# Patient Record
Sex: Male | Born: 1950
Health system: Southern US, Community
[De-identification: ages and names within clinical notes are randomized; demographics above are authoritative.]

## PROBLEM LIST (undated history)

## (undated) ENCOUNTER — Emergency Department: Admission: EM | Discharge status: Absent without leave | Payer: Self-pay

## (undated) DIAGNOSIS — I10 Essential (primary) hypertension: Secondary | ICD-10-CM

## (undated) DIAGNOSIS — G510 Bell's palsy: Secondary | ICD-10-CM

## (undated) DIAGNOSIS — E785 Hyperlipidemia, unspecified: Secondary | ICD-10-CM

## (undated) DIAGNOSIS — F329 Major depressive disorder, single episode, unspecified: Secondary | ICD-10-CM

## (undated) DIAGNOSIS — F32A Depression, unspecified: Secondary | ICD-10-CM

## (undated) HISTORY — DX: Major depressive disorder, single episode, unspecified: F32.9

## (undated) HISTORY — PX: OTHER SURGICAL HISTORY: SHX169

## (undated) HISTORY — DX: Essential (primary) hypertension: I10

## (undated) HISTORY — DX: Bell's palsy: G51.0

## (undated) HISTORY — DX: Hyperlipidemia, unspecified: E78.5

## (undated) HISTORY — DX: Depression, unspecified: F32.A

## (undated) HISTORY — PX: CATARACT EXTRACTION: SUR2

---

## 1992-06-29 ENCOUNTER — Encounter: Payer: Self-pay | Admitting: Family Medicine

## 2003-01-22 ENCOUNTER — Emergency Department (HOSPITAL_COMMUNITY): Admission: EM | Admit: 2003-01-22 | Discharge: 2003-01-22 | Payer: Self-pay | Admitting: Emergency Medicine

## 2004-01-11 ENCOUNTER — Encounter: Payer: Self-pay | Admitting: Family Medicine

## 2004-01-11 LAB — CONVERTED CEMR LAB

## 2005-08-13 ENCOUNTER — Ambulatory Visit: Payer: Self-pay | Admitting: Family Medicine

## 2005-09-04 ENCOUNTER — Ambulatory Visit: Payer: Self-pay | Admitting: Family Medicine

## 2005-11-02 ENCOUNTER — Ambulatory Visit: Payer: Self-pay | Admitting: Family Medicine

## 2005-11-30 ENCOUNTER — Ambulatory Visit: Payer: Self-pay | Admitting: Family Medicine

## 2005-12-28 ENCOUNTER — Ambulatory Visit: Payer: Self-pay | Admitting: Family Medicine

## 2006-02-07 ENCOUNTER — Ambulatory Visit: Payer: Self-pay | Admitting: Family Medicine

## 2006-02-21 ENCOUNTER — Ambulatory Visit: Payer: Self-pay | Admitting: Family Medicine

## 2006-03-20 ENCOUNTER — Ambulatory Visit: Payer: Self-pay | Admitting: Family Medicine

## 2006-03-28 ENCOUNTER — Ambulatory Visit: Payer: Self-pay | Admitting: Family Medicine

## 2006-04-12 LAB — HM COLONOSCOPY

## 2006-04-17 ENCOUNTER — Ambulatory Visit: Payer: Self-pay | Admitting: Internal Medicine

## 2006-04-25 ENCOUNTER — Encounter (INDEPENDENT_AMBULATORY_CARE_PROVIDER_SITE_OTHER): Payer: Self-pay | Admitting: Specialist

## 2006-04-25 ENCOUNTER — Ambulatory Visit: Payer: Self-pay | Admitting: Internal Medicine

## 2006-05-14 ENCOUNTER — Ambulatory Visit: Payer: Self-pay | Admitting: Family Medicine

## 2006-08-19 ENCOUNTER — Ambulatory Visit: Payer: Self-pay | Admitting: Family Medicine

## 2006-08-20 DIAGNOSIS — I10 Essential (primary) hypertension: Secondary | ICD-10-CM | POA: Insufficient documentation

## 2006-08-20 DIAGNOSIS — N200 Calculus of kidney: Secondary | ICD-10-CM | POA: Insufficient documentation

## 2006-08-20 DIAGNOSIS — F339 Major depressive disorder, recurrent, unspecified: Secondary | ICD-10-CM | POA: Insufficient documentation

## 2006-08-20 DIAGNOSIS — E785 Hyperlipidemia, unspecified: Secondary | ICD-10-CM | POA: Insufficient documentation

## 2006-08-20 DIAGNOSIS — G4730 Sleep apnea, unspecified: Secondary | ICD-10-CM | POA: Insufficient documentation

## 2006-08-20 DIAGNOSIS — F17200 Nicotine dependence, unspecified, uncomplicated: Secondary | ICD-10-CM | POA: Insufficient documentation

## 2006-09-10 ENCOUNTER — Ambulatory Visit: Payer: Self-pay | Admitting: Family Medicine

## 2006-09-16 ENCOUNTER — Encounter: Payer: Self-pay | Admitting: Family Medicine

## 2006-09-16 LAB — CONVERTED CEMR LAB
Albumin: 4.4 g/dL (ref 3.5–5.2)
CO2: 22 meq/L (ref 19–32)
Cholesterol: 168 mg/dL (ref 0–200)
Glucose, Bld: 137 mg/dL — ABNORMAL HIGH (ref 70–99)
LDL Cholesterol: 105 mg/dL — ABNORMAL HIGH (ref 0–99)
PSA: 0.87 ng/mL (ref 0.10–4.00)
Potassium: 4.2 meq/L (ref 3.5–5.3)
Sodium: 138 meq/L (ref 135–145)
Total Protein: 6.8 g/dL (ref 6.0–8.3)
Triglycerides: 135 mg/dL (ref <150)

## 2006-09-19 ENCOUNTER — Encounter: Payer: Self-pay | Admitting: Family Medicine

## 2006-12-06 ENCOUNTER — Telehealth: Payer: Self-pay | Admitting: Family Medicine

## 2007-03-13 ENCOUNTER — Ambulatory Visit: Payer: Self-pay | Admitting: Family Medicine

## 2007-03-13 DIAGNOSIS — N3941 Urge incontinence: Secondary | ICD-10-CM | POA: Insufficient documentation

## 2007-03-13 DIAGNOSIS — E11630 Type 2 diabetes mellitus with periodontal disease: Secondary | ICD-10-CM | POA: Insufficient documentation

## 2007-03-13 LAB — CONVERTED CEMR LAB
Blood in Urine, dipstick: NEGATIVE
Glucose, Urine, Semiquant: NEGATIVE
Ketones, urine, test strip: NEGATIVE
Specific Gravity, Urine: <1.005
WBC Urine, dipstick: NEGATIVE
pH: 5.5

## 2007-05-06 ENCOUNTER — Ambulatory Visit: Payer: Self-pay | Admitting: Family Medicine

## 2007-05-06 LAB — CONVERTED CEMR LAB

## 2007-05-12 ENCOUNTER — Telehealth (INDEPENDENT_AMBULATORY_CARE_PROVIDER_SITE_OTHER): Payer: Self-pay | Admitting: *Deleted

## 2007-08-12 ENCOUNTER — Ambulatory Visit: Payer: Self-pay | Admitting: Family Medicine

## 2007-08-12 ENCOUNTER — Encounter: Admission: RE | Admit: 2007-08-12 | Discharge: 2007-08-12 | Payer: Self-pay | Admitting: Family Medicine

## 2007-08-14 ENCOUNTER — Telehealth (INDEPENDENT_AMBULATORY_CARE_PROVIDER_SITE_OTHER): Payer: Self-pay | Admitting: *Deleted

## 2007-08-18 ENCOUNTER — Telehealth: Payer: Self-pay | Admitting: Family Medicine

## 2007-08-25 ENCOUNTER — Ambulatory Visit: Payer: Self-pay | Admitting: Family Medicine

## 2007-09-25 ENCOUNTER — Ambulatory Visit: Payer: Self-pay | Admitting: Family Medicine

## 2007-10-20 ENCOUNTER — Telehealth: Payer: Self-pay | Admitting: Family Medicine

## 2007-12-25 ENCOUNTER — Ambulatory Visit: Payer: Self-pay | Admitting: Family Medicine

## 2007-12-26 ENCOUNTER — Encounter: Payer: Self-pay | Admitting: Family Medicine

## 2007-12-26 LAB — CONVERTED CEMR LAB
Albumin: 4.5 g/dL (ref 3.5–5.2)
Alkaline Phosphatase: 61 units/L (ref 39–117)
BUN: 15 mg/dL (ref 6–23)
CO2: 23 meq/L (ref 19–32)
Calcium: 9.8 mg/dL (ref 8.4–10.5)
Cholesterol: 164 mg/dL (ref 0–200)
Glucose, Bld: 140 mg/dL — ABNORMAL HIGH (ref 70–99)
HDL: 39 mg/dL — ABNORMAL LOW (ref >39)
LDL Cholesterol: 97 mg/dL (ref 0–99)
Potassium: 4.3 meq/L (ref 3.5–5.3)
Total Protein: 7 g/dL (ref 6.0–8.3)
Triglycerides: 138 mg/dL (ref <150)

## 2008-01-07 ENCOUNTER — Ambulatory Visit: Payer: Self-pay | Admitting: Cardiology

## 2008-01-08 ENCOUNTER — Telehealth: Payer: Self-pay | Admitting: Family Medicine

## 2008-01-14 ENCOUNTER — Encounter: Payer: Self-pay | Admitting: Family Medicine

## 2008-01-14 ENCOUNTER — Ambulatory Visit: Payer: Self-pay

## 2008-01-26 ENCOUNTER — Telehealth: Payer: Self-pay | Admitting: Family Medicine

## 2008-01-30 ENCOUNTER — Telehealth: Payer: Self-pay | Admitting: Family Medicine

## 2008-02-02 ENCOUNTER — Ambulatory Visit: Payer: Self-pay | Admitting: Family Medicine

## 2008-02-12 ENCOUNTER — Telehealth: Payer: Self-pay | Admitting: Family Medicine

## 2008-03-23 ENCOUNTER — Ambulatory Visit: Payer: Self-pay | Admitting: Family Medicine

## 2008-03-24 ENCOUNTER — Encounter: Payer: Self-pay | Admitting: Family Medicine

## 2008-06-28 ENCOUNTER — Ambulatory Visit: Payer: Self-pay | Admitting: Family Medicine

## 2008-06-28 LAB — CONVERTED CEMR LAB
Creatinine,U: 50 mg/dL
Hgb A1c MFr Bld: 8.1 %
Microalbumin U total vol: 10 mg/L

## 2008-07-01 ENCOUNTER — Ambulatory Visit (HOSPITAL_COMMUNITY): Payer: Self-pay | Admitting: Psychology

## 2008-07-07 ENCOUNTER — Telehealth (INDEPENDENT_AMBULATORY_CARE_PROVIDER_SITE_OTHER): Payer: Self-pay | Admitting: *Deleted

## 2008-07-12 ENCOUNTER — Ambulatory Visit (HOSPITAL_COMMUNITY): Payer: Self-pay | Admitting: Psychology

## 2008-07-20 ENCOUNTER — Ambulatory Visit (HOSPITAL_COMMUNITY): Payer: Self-pay | Admitting: Psychology

## 2008-07-28 ENCOUNTER — Ambulatory Visit (HOSPITAL_COMMUNITY): Payer: Self-pay | Admitting: Psychology

## 2008-08-04 ENCOUNTER — Ambulatory Visit (HOSPITAL_COMMUNITY): Payer: Self-pay | Admitting: Psychology

## 2008-08-09 ENCOUNTER — Ambulatory Visit: Payer: Self-pay | Admitting: Family Medicine

## 2008-08-10 ENCOUNTER — Telehealth: Payer: Self-pay | Admitting: Family Medicine

## 2008-08-11 ENCOUNTER — Ambulatory Visit (HOSPITAL_COMMUNITY): Payer: Self-pay | Admitting: Psychology

## 2008-08-19 ENCOUNTER — Ambulatory Visit (HOSPITAL_COMMUNITY): Payer: Self-pay | Admitting: Psychology

## 2008-08-20 ENCOUNTER — Telehealth: Payer: Self-pay | Admitting: Family Medicine

## 2008-08-26 ENCOUNTER — Ambulatory Visit (HOSPITAL_COMMUNITY): Payer: Self-pay | Admitting: Psychology

## 2008-09-02 ENCOUNTER — Ambulatory Visit (HOSPITAL_COMMUNITY): Payer: Self-pay | Admitting: Psychology

## 2008-09-09 ENCOUNTER — Telehealth: Payer: Self-pay | Admitting: Family Medicine

## 2008-09-14 ENCOUNTER — Ambulatory Visit (HOSPITAL_COMMUNITY): Payer: Self-pay | Admitting: Psychology

## 2008-09-28 ENCOUNTER — Ambulatory Visit (HOSPITAL_COMMUNITY): Payer: Self-pay | Admitting: Psychology

## 2008-09-29 ENCOUNTER — Telehealth: Payer: Self-pay | Admitting: Family Medicine

## 2008-10-12 ENCOUNTER — Ambulatory Visit (HOSPITAL_COMMUNITY): Payer: Self-pay | Admitting: Psychology

## 2008-10-22 ENCOUNTER — Ambulatory Visit (HOSPITAL_COMMUNITY): Payer: Self-pay | Admitting: Psychiatry

## 2008-10-26 ENCOUNTER — Ambulatory Visit (HOSPITAL_COMMUNITY): Payer: Self-pay | Admitting: Psychology

## 2008-11-08 ENCOUNTER — Ambulatory Visit: Payer: Self-pay | Admitting: Family Medicine

## 2008-11-08 LAB — CONVERTED CEMR LAB: Hgb A1c MFr Bld: 7.1 %

## 2008-11-10 ENCOUNTER — Ambulatory Visit (HOSPITAL_COMMUNITY): Payer: Self-pay | Admitting: Psychology

## 2008-11-23 ENCOUNTER — Ambulatory Visit (HOSPITAL_COMMUNITY): Payer: Self-pay | Admitting: Psychology

## 2008-11-26 ENCOUNTER — Ambulatory Visit: Payer: Self-pay | Admitting: Family Medicine

## 2008-11-26 DIAGNOSIS — IMO0002 Reserved for concepts with insufficient information to code with codable children: Secondary | ICD-10-CM | POA: Insufficient documentation

## 2008-12-03 ENCOUNTER — Telehealth: Payer: Self-pay | Admitting: Family Medicine

## 2008-12-03 ENCOUNTER — Ambulatory Visit (HOSPITAL_COMMUNITY): Payer: Self-pay | Admitting: Psychiatry

## 2008-12-07 ENCOUNTER — Ambulatory Visit (HOSPITAL_COMMUNITY): Payer: Self-pay | Admitting: Psychology

## 2008-12-14 ENCOUNTER — Telehealth: Payer: Self-pay | Admitting: Family Medicine

## 2008-12-15 ENCOUNTER — Encounter: Payer: Self-pay | Admitting: Family Medicine

## 2008-12-28 ENCOUNTER — Ambulatory Visit (HOSPITAL_COMMUNITY): Payer: Self-pay | Admitting: Psychology

## 2009-01-18 ENCOUNTER — Ambulatory Visit (HOSPITAL_COMMUNITY): Payer: Self-pay | Admitting: Psychology

## 2009-02-02 ENCOUNTER — Ambulatory Visit (HOSPITAL_COMMUNITY): Payer: Self-pay | Admitting: Psychiatry

## 2009-02-07 ENCOUNTER — Ambulatory Visit: Payer: Self-pay | Admitting: Family Medicine

## 2009-02-07 LAB — CONVERTED CEMR LAB: Hgb A1c MFr Bld: 7.4 %

## 2009-02-21 ENCOUNTER — Encounter: Payer: Self-pay | Admitting: Family Medicine

## 2009-02-23 LAB — CONVERTED CEMR LAB
AST: 13 units/L (ref 0–37)
Albumin: 4.4 g/dL (ref 3.5–5.2)
Alkaline Phosphatase: 61 units/L (ref 39–117)
CO2: 21 meq/L (ref 19–32)
Chloride: 103 meq/L (ref 96–112)
Creatinine, Ser: 0.81 mg/dL (ref 0.40–1.50)
Glucose, Bld: 144 mg/dL — ABNORMAL HIGH (ref 70–99)
HDL: 37 mg/dL — ABNORMAL LOW (ref >39)
LDL Cholesterol: 66 mg/dL (ref 0–99)
Total Bilirubin: 0.5 mg/dL (ref 0.3–1.2)
Total Protein: 6.7 g/dL (ref 6.0–8.3)

## 2009-03-28 ENCOUNTER — Ambulatory Visit (HOSPITAL_COMMUNITY): Payer: Self-pay | Admitting: Psychiatry

## 2009-05-09 ENCOUNTER — Encounter: Payer: Self-pay | Admitting: Family Medicine

## 2009-05-09 ENCOUNTER — Ambulatory Visit (HOSPITAL_COMMUNITY): Payer: Self-pay | Admitting: Psychiatry

## 2009-05-09 DIAGNOSIS — H25019 Cortical age-related cataract, unspecified eye: Secondary | ICD-10-CM | POA: Insufficient documentation

## 2009-06-16 ENCOUNTER — Ambulatory Visit: Payer: Self-pay | Admitting: Family Medicine

## 2009-06-16 LAB — CONVERTED CEMR LAB: Hgb A1c MFr Bld: 7.3 %

## 2009-07-14 ENCOUNTER — Ambulatory Visit (HOSPITAL_COMMUNITY): Payer: Self-pay | Admitting: Psychiatry

## 2009-09-16 ENCOUNTER — Ambulatory Visit: Payer: Self-pay | Admitting: Family Medicine

## 2009-10-12 DIAGNOSIS — G510 Bell's palsy: Secondary | ICD-10-CM

## 2009-10-12 HISTORY — DX: Bell's palsy: G51.0

## 2009-10-17 ENCOUNTER — Ambulatory Visit (HOSPITAL_COMMUNITY): Payer: Self-pay | Admitting: Psychiatry

## 2009-10-26 ENCOUNTER — Telehealth: Payer: Self-pay | Admitting: Family Medicine

## 2009-10-26 ENCOUNTER — Ambulatory Visit: Payer: Self-pay | Admitting: Family Medicine

## 2009-10-26 DIAGNOSIS — G510 Bell's palsy: Secondary | ICD-10-CM | POA: Insufficient documentation

## 2009-10-29 ENCOUNTER — Encounter: Admission: RE | Admit: 2009-10-29 | Discharge: 2009-10-29 | Payer: Self-pay | Admitting: Family Medicine

## 2009-11-02 ENCOUNTER — Ambulatory Visit: Payer: Self-pay | Admitting: Family Medicine

## 2009-11-03 ENCOUNTER — Telehealth (INDEPENDENT_AMBULATORY_CARE_PROVIDER_SITE_OTHER): Payer: Self-pay | Admitting: *Deleted

## 2009-11-16 ENCOUNTER — Ambulatory Visit: Payer: Self-pay | Admitting: Family Medicine

## 2010-01-02 ENCOUNTER — Ambulatory Visit: Payer: Self-pay | Admitting: Family Medicine

## 2010-01-02 LAB — HM DIABETES FOOT EXAM

## 2010-01-02 LAB — CONVERTED CEMR LAB: Microalbumin U total vol: 10 mg/L

## 2010-01-12 ENCOUNTER — Telehealth (INDEPENDENT_AMBULATORY_CARE_PROVIDER_SITE_OTHER): Payer: Self-pay | Admitting: *Deleted

## 2010-01-24 ENCOUNTER — Ambulatory Visit (HOSPITAL_COMMUNITY): Payer: Self-pay | Admitting: Psychiatry

## 2010-04-03 ENCOUNTER — Ambulatory Visit: Payer: Self-pay | Admitting: Family Medicine

## 2010-04-05 ENCOUNTER — Encounter: Payer: Self-pay | Admitting: Family Medicine

## 2010-04-06 LAB — CONVERTED CEMR LAB
Albumin: 4.3 g/dL (ref 3.5–5.2)
Alkaline Phosphatase: 63 units/L (ref 39–117)
BUN: 16 mg/dL (ref 6–23)
Calcium: 9 mg/dL (ref 8.4–10.5)
Cholesterol: 152 mg/dL (ref 0–200)
Glucose, Bld: 135 mg/dL — ABNORMAL HIGH (ref 70–99)
HDL: 38 mg/dL — ABNORMAL LOW (ref >39)
LDL Cholesterol: 91 mg/dL (ref 0–99)
Potassium: 5.3 meq/L (ref 3.5–5.3)
Triglycerides: 114 mg/dL (ref <150)

## 2010-04-25 ENCOUNTER — Ambulatory Visit (HOSPITAL_COMMUNITY): Payer: Self-pay | Admitting: Psychiatry

## 2010-05-05 ENCOUNTER — Encounter: Payer: Self-pay | Admitting: Family Medicine

## 2010-05-05 LAB — HM DIABETES EYE EXAM

## 2010-05-11 ENCOUNTER — Encounter: Payer: Self-pay | Admitting: Family Medicine

## 2010-06-05 ENCOUNTER — Ambulatory Visit (HOSPITAL_COMMUNITY): Payer: Self-pay | Admitting: Psychiatry

## 2010-06-28 ENCOUNTER — Ambulatory Visit (HOSPITAL_COMMUNITY): Payer: Self-pay | Admitting: Psychology

## 2010-07-12 ENCOUNTER — Ambulatory Visit (HOSPITAL_COMMUNITY): Payer: Self-pay | Admitting: Psychology

## 2010-08-04 ENCOUNTER — Ambulatory Visit: Payer: Self-pay | Admitting: Family Medicine

## 2010-08-04 DIAGNOSIS — L259 Unspecified contact dermatitis, unspecified cause: Secondary | ICD-10-CM | POA: Insufficient documentation

## 2010-08-08 ENCOUNTER — Ambulatory Visit (HOSPITAL_COMMUNITY): Payer: Self-pay | Admitting: Psychology

## 2010-09-04 ENCOUNTER — Ambulatory Visit (HOSPITAL_COMMUNITY): Payer: Self-pay | Admitting: Psychiatry

## 2010-10-23 ENCOUNTER — Ambulatory Visit (HOSPITAL_COMMUNITY): Payer: Self-pay | Admitting: Psychiatry

## 2010-12-05 ENCOUNTER — Encounter: Payer: Self-pay | Admitting: Family Medicine

## 2010-12-05 ENCOUNTER — Ambulatory Visit
Admission: RE | Admit: 2010-12-05 | Discharge: 2010-12-05 | Payer: Self-pay | Source: Home / Self Care | Attending: Family Medicine | Admitting: Family Medicine

## 2010-12-12 NOTE — Assessment & Plan Note (Signed)
Summary: f/u DM/ Bells Palsy   Vital Signs:  Patient profile:   60 year old male Height:      69 inches Weight:      206 pounds BMI:     30.53 O2 Sat:      97 % on Room air Temp:     98.0 degrees F oral Pulse rate:   66 / minute BP sitting:   149 / 85  (left arm) Cuff size:   large  Vitals Entered By: Payton Spark CMA (January 02, 2010 1:15 PM)  O2 Flow:  Room air  CC: F/U DM   Primary Care Provider:  Seymour Bars D.O.  CC:  F/U DM.  History of Present Illness: 60 yo WM presents for f/u T2DM, HTN, Bells Palsy.  He is on Metformin 1 gram two times a day and Lantus 15 units qHS.  He has been under some stress w/ his mom's failing health.  His Bells Palsy is improving slowly.  He admits to only fair compliance with diet and meds for his diabetes.  Denies blurry vision but has eye irritation bilat -- on the R from his Bells Palsy and has yet to f/u with optho as recommended last time.  His  AM fastings are running high at 150.    He is taking his BP meds w/o problem.  His labs are UTD.    Current Medications (verified): 1)  Metformin Hcl 1000 Mg  Tabs (Metformin Hcl) .Marland Kitchen.. 1 Tab By Mouth Bid 2)  Aspirin 81 Mg Tbec (Aspirin) .... Take 1 Tablet By Mouth Once A Day 3)  Lisinopril-Hydrochlorothiazide 10-12.5 Mg Tabs (Lisinopril-Hydrochlorothiazide) .... Take 1 Tablet By Mouth Once A Day 4)  Onetouch Ultra Test  Strp (Glucose Blood) .... Use As Directed Once Daily To Test Blood Sugar 5)  Lantus Solostar 100 Unit/ml Soln (Insulin Glargine) .Marland Kitchen.. 15  Units [place] At Bedtime 6)  Onetouch Ultra System W/device Kit (Blood Glucose Monitoring Suppl) .... Use As Directed Dx:250:00 7)  Lexapro 20 Mg Tabs (Escitalopram Oxalate) .Marland Kitchen.. 1&1/2 Tab By Mouth Daily 8)  Xanax Xr 9)  Novofine 32g X 6 Mm Misc (Insulin Pen Needle) .... Use As Directed  Allergies (verified): No Known Drug Allergies  Past History:  Past Medical History: Reviewed history from 11/16/2009 and no changes required. CPAP--  not using  hypnosis helped panic attacks  Diabetes mellitus, type II HTN hyperlipidemia depression Bells Palsy 10-2009  psych - Dr Christell Constant  Social History: Reviewed history from 08/09/2008 and no changes required. Divorced with grown son.  Has a girfriend (but has low libido).  Smokes 2ppd x 25 yrs.  Occas. ETOH.  Retired from [employer].  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 Wants to start walking.  trying to quit smoking -- on Chantix  Review of Systems      See HPI  Physical Exam  General:  alert, well-developed, well-nourished, and well-hydrated.   Head:  slight raising of the R eyebrow , improved closure R eyelid and improved smile R corner of mouth Eyes:  L>R conjunctiva injection. slightly dry R conjunctiva Nose:  no nasal discharge.   Mouth:  pharynx pink and moist and fair dentition.   Neck:  no masses.   Lungs:  Normal respiratory effort, chest expands symmetrically. Lungs are clear to auscultation, no crackles or wheezes. Heart:  Normal rate and regular rhythm. S1 and S2 normal without gallop, murmur, click, rub or other extra sounds. Pulses:  2+ radial and pedal pulses Extremities:  no LE edema  + clubbing of fingernails Skin:  color normal.   dermatitis type papular rash at back of neck. Cervical Nodes:  No lymphadenopathy noted Psych:  flat affect.    Diabetes Management Exam:    Foot Exam (with socks and/or shoes not present):       Sensory-Pinprick/Light touch:          Left medial foot (L-4): normal          Left dorsal foot (L-5): normal          Left lateral foot (S-1): normal          Right medial foot (L-4): normal          Right dorsal foot (L-5): normal          Right lateral foot (S-1): normal       Sensory-Monofilament:          Left foot: normal          Right foot: normal       Inspection:          Left foot: normal          Right foot: normal       Nails:          Left foot: normal           Right foot: normal   Impression & Recommendations:  Problem # 1:  DIABETES MELLITUS, TYPE II (ICD-250.00) A1C is up to 8 from 7.  Will increase his lantus from 15 to 20 units daily and keep metformin at current dose.  He really needs to work on improving adherence to meds and diet.   His updated medication list for this problem includes:    Metformin Hcl 1000 Mg Tabs (Metformin hcl) .Marland Kitchen... 1 tab by mouth bid    Aspirin 81 Mg Tbec (Aspirin) .Marland Kitchen... Take 1 tablet by mouth once a day    Lisinopril-hydrochlorothiazide 20-12.5 Mg Tabs (Lisinopril-hydrochlorothiazide) .Marland Kitchen... 1 tab by mouth daily    Lantus Solostar 100 Unit/ml Soln (Insulin glargine) .Marland Kitchen... 20  units [place] at bedtime  Orders: Fingerstick (36416) Hemoglobin A1C (69629) Urine Microalbumin (52841)  Labs Reviewed: Creat: 0.81 (02/21/2009)   Microalbumin: 10 (01/02/2010) Reviewed HgBA1c results: 8.1 (01/02/2010)  7.0 (09/16/2009)  Problem # 2:  HYPERTENSION, BENIGN SYSTEMIC (ICD-401.1) BP high with a goal of <130/80.  Will go up on the lisinopril portion of his pill and f/u in 3 mos. His updated medication list for this problem includes:    Lisinopril-hydrochlorothiazide 20-12.5 Mg Tabs (Lisinopril-hydrochlorothiazide) .Marland Kitchen... 1 tab by mouth daily  BP today: 149/85 Prior BP: 123/74 (11/16/2009)  Labs Reviewed: K+: 4.3 (02/21/2009) Creat: : 0.81 (02/21/2009)   Chol: 126 (02/21/2009)   HDL: 37 (02/21/2009)   LDL: 66 (02/21/2009)   TG: 116 (02/21/2009)  Problem # 3:  BELL'S PALSY (ICD-351.0) He is about 50% better from inital dx 2 mos ago, finished out prednisone. He is to see optho for R and L eye irritation. Will continue to monitor his slow improvements.  Problem # 4:  HYPERLIPIDEMIA (ICD-272.4) Labs UTD.  Continue current meds. The following medications were removed from the medication list:    Pravachol 40 Mg Tabs (Pravastatin sodium) .Marland Kitchen... 1 tab by mouth qhs  Labs Reviewed: SGOT: 13 (02/21/2009)   SGPT: 11 (02/21/2009)    HDL:37 (02/21/2009), 39 (12/26/2007)  LDL:66 (02/21/2009), 97 (12/26/2007)  Chol:126 (02/21/2009), 164 (12/26/2007)  Trig:116 (02/21/2009), 138 (12/26/2007)  Problem # 5:  DEPRESSION, MAJOR, RECURRENT (ICD-296.30) Seeing Dr Christell Constant.  Still under a lot of stress at home.  Complete Medication List: 1)  Metformin Hcl 1000 Mg Tabs (Metformin hcl) .Marland Kitchen.. 1 tab by mouth bid 2)  Aspirin 81 Mg Tbec (Aspirin) .... Take 1 tablet by mouth once a day 3)  Lisinopril-hydrochlorothiazide 20-12.5 Mg Tabs (Lisinopril-hydrochlorothiazide) .Marland Kitchen.. 1 tab by mouth daily 4)  Onetouch Ultra Test Strp (Glucose blood) .... Use as directed once daily to test blood sugar 5)  Lantus Solostar 100 Unit/ml Soln (Insulin glargine) .... 20  units [place] at bedtime 6)  Lexapro 20 Mg Tabs (Escitalopram oxalate) .Marland Kitchen.. 1&1/2 tab by mouth daily 7)  Xanax Xr  8)  Novofine 32g X 6 Mm Misc (Insulin pen needle) .... Use as directed 9)  Triamcinolone Acetonide 0.5 % Crea (Triamcinolone acetonide) .... Apply to rash two times a day x 10 days  Patient Instructions: 1)  Work on healthy diabetic diet, regular exercise, wt loss. 2)  F/U with optho for dry eyes. 3)  I adjusted your Lantus and BP meds slightly to improve readings. 4)  Use RX cream for rash on neck. 5)  Return for f/u Diabetes in 3 mos. Prescriptions: TRIAMCINOLONE ACETONIDE 0.5 % CREA (TRIAMCINOLONE ACETONIDE) apply to rash two times a day x 10 days  #1 tube x 0   Entered and Authorized by:   Seymour Bars DO   Signed by:   Seymour Bars DO on 01/02/2010   Method used:   Electronically to        [facility] [phone]* (retail)       [address] [place], [place]  [address]       Ph: [phone]       Fax: [phone]   RxID:   [id number] METFORMIN HCL 1000 MG  TABS (METFORMIN HCL) 1 tab by mouth bid  #60 x 5   Entered and Authorized by:   Seymour Bars DO   Signed by:   Seymour Bars DO on 01/02/2010   Method used:   Electronically to        [facility] [phone]*  (retail)       [address] [place], [place]  [address]       Ph: [phone]       Fax: [phone]   RxID:   [id number] LISINOPRIL-HYDROCHLOROTHIAZIDE 20-12.5 MG TABS (LISINOPRIL-HYDROCHLOROTHIAZIDE) 1 tab by mouth daily  #30 x 5   Entered and Authorized by:   Seymour Bars DO   Signed by:   Seymour Bars DO on 01/02/2010   Method used:   Electronically to        [facility] [phone]* (retail)       [address] [place], [place]  [address]       Ph: [phone]       Fax: [phone]   RxID:   [id number]   Laboratory Results   Urine Tests    Microalbumin (urine): 10 mg/L Creatinine: 100mg /dL  A:C Ratio <62  Blood Tests     HGBA1C: 8.1%   (Normal Range: Non-Diabetic - 3-6%   Control Diabetic - 6-8%)

## 2010-12-12 NOTE — Miscellaneous (Signed)
Summary: no retinopathy  Clinical Lists Changes  Observations: Added new observation of DIAB EYE EX: no retinopathy (Dr Tiburcio Pea) (05/05/2010 8:45)

## 2010-12-12 NOTE — Assessment & Plan Note (Signed)
Summary: f/u DM   Vital Signs:  Patient profile:   60 year old male Height:      69 inches Weight:      201 pounds BMI:     29.79 O2 Sat:      97 % on Room air Pulse rate:   69 / minute BP sitting:   115 / 69  (left arm) Cuff size:   large  Vitals Entered By: Payton Spark CMA (Apr 03, 2010 1:17 PM)  O2 Flow:  Room air CC: F/U DM   Primary Care Provider:  Seymour Bars D.O.  CC:  F/U DM.  History of Present Illness: 60 yo WM presents for f/u T2DM.  He is injecting 20 units of Lantus once a day and Metformin 1 gram two times a day.  He reports eating a lot of candy.  When he does, his AM fastings are in the low 200s.  Sometimes, his AM fastings are in the 130s.  He is still not exercising.  Dr August Luz. He is due for his eye exam and has not been seen since having Bells Palsy. He is UTD with his monofilatment and urine microalbumin.  His R eye is still tearing.    Current Medications (verified): 1)  Metformin Hcl 1000 Mg  Tabs (Metformin Hcl) .Marland Kitchen.. 1 Tab By Mouth Bid 2)  Aspirin 81 Mg Tbec (Aspirin) .... Take 1 Tablet By Mouth Once A Day 3)  Lisinopril-Hydrochlorothiazide 20-12.5 Mg Tabs (Lisinopril-Hydrochlorothiazide) .Marland Kitchen.. 1 Tab By Mouth Daily 4)  Onetouch Ultra Test  Strp (Glucose Blood) .... Use As Directed Once Daily To Test Blood Sugar 5)  Lantus Solostar 100 Unit/ml Soln (Insulin Glargine) .... 20  Units [place] At Bedtime 6)  Lexapro 20 Mg Tabs (Escitalopram Oxalate) .Marland Kitchen.. 1&1/2 Tab By Mouth Daily 7)  Xanax Xr 8)  Novofine 32g X 6 Mm Misc (Insulin Pen Needle) .... Use As Directed  Allergies (verified): No Known Drug Allergies  Past History:  Past Medical History: Reviewed history from 11/16/2009 and no changes required. CPAP-- not using  hypnosis helped panic attacks  Diabetes mellitus, type II HTN hyperlipidemia depression Bells Palsy 10-2009  psych - Dr Christell Constant  Social History: Reviewed history from 08/09/2008 and no changes required. Divorced with grown son.   Has a girfriend (but has low libido).  Smokes 2ppd x 25 yrs.  Occas. ETOH.  Retired from [employer].  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 Wants to start walking.  trying to quit smoking -- on Chantix  Review of Systems      See HPI  Physical Exam  General:  alert, well-developed, well-nourished, well-hydrated, and overweight-appearing.   Head:  normocephalic and atraumatic.   Eyes:  much improved R eyelid ptosis with watering R eye Nose:  no nasal discharge.   Mouth:  good dentition and pharynx pink and moist.   Neck:  no masses.   Lungs:  Normal respiratory effort, chest expands symmetrically. Lungs are clear to auscultation, no crackles or wheezes. Heart:  Normal rate and regular rhythm. S1 and S2 normal without gallop, murmur, click, rub or other extra sounds. Extremities:  no LE edema Skin:  color normal.   Cervical Nodes:  No lymphadenopathy noted Psych:  good eye contact, not anxious appearing, and not depressed appearing.     Impression & Recommendations:  Problem # 1:  DIABETES MELLITUS, TYPE II (ICD-250.00) A1C at goal of 6.8 on current meds.  Continue.  Work on  improving diet and exercise, wt loss.  Labs UTD.  Schedule eye exam. His updated medication list for this problem includes:    Metformin Hcl 1000 Mg Tabs (Metformin hcl) .Marland Kitchen... 1 tab by mouth bid    Aspirin 81 Mg Tbec (Aspirin) .Marland Kitchen... Take 1 tablet by mouth once a day    Lisinopril-hydrochlorothiazide 20-12.5 Mg Tabs (Lisinopril-hydrochlorothiazide) .Marland Kitchen... 1 tab by mouth daily    Lantus Solostar 100 Unit/ml Soln (Insulin glargine) .Marland Kitchen... 20  units [place] at bedtime  Orders: Fingerstick (36416) Hemoglobin A1C (84696) T-Comprehensive Metabolic Panel (29528-41324) Ophthalmology Referral (Ophthalmology)  Problem # 2:  BELL'S PALSY (ICD-351.0) Improving.  Schedule eye exam.  Concern for corneal irritation from bells palsy for months. Orders: Ophthalmology Referral  (Ophthalmology)  Problem # 3:  HYPERTENSION, BENIGN SYSTEMIC (ICD-401.1) At goal.  Continue current meds and update labs. His updated medication list for this problem includes:    Lisinopril-hydrochlorothiazide 20-12.5 Mg Tabs (Lisinopril-hydrochlorothiazide) .Marland Kitchen... 1 tab by mouth daily  BP today: 115/69 Prior BP: 149/85 (01/02/2010)  Labs Reviewed: K+: 4.3 (02/21/2009) Creat: : 0.81 (02/21/2009)   Chol: 126 (02/21/2009)   HDL: 37 (02/21/2009)   LDL: 66 (02/21/2009)   TG: 116 (02/21/2009)  Complete Medication List: 1)  Metformin Hcl 1000 Mg Tabs (Metformin hcl) .Marland Kitchen.. 1 tab by mouth bid 2)  Aspirin 81 Mg Tbec (Aspirin) .... Take 1 tablet by mouth once a day 3)  Lisinopril-hydrochlorothiazide 20-12.5 Mg Tabs (Lisinopril-hydrochlorothiazide) .Marland Kitchen.. 1 tab by mouth daily 4)  Onetouch Ultra Test Strp (Glucose blood) .... Use as directed once daily to test blood sugar 5)  Lantus Solostar 100 Unit/ml Soln (Insulin glargine) .... 20  units [place] at bedtime 6)  Lexapro 20 Mg Tabs (Escitalopram oxalate) .Marland Kitchen.. 1&1/2 tab by mouth daily 7)  Xanax Xr  8)  Novofine 32g X 6 Mm Misc (Insulin pen needle) .... Use as directed  Other Orders: T-Lipid Profile ([id number]) T-PSA ([id number])  Patient Instructions: 1)  A1C perfect at 6.8. 2)  Continue current meds. 3)  Work on regular exercise and avoid sweets. 4)  Update fasting labs. 5)  Will call you w/ results. 6)  Return for f/u in 4 mos.

## 2010-12-12 NOTE — Assessment & Plan Note (Signed)
Summary: f/u DM   Vital Signs:  Patient profile:   60 year old male Height:      69 inches Weight:      201 pounds BMI:     29.79 O2 Sat:      96 % on Room air Pulse rate:   62 / minute BP sitting:   142 / 83  (left arm) Cuff size:   large  Vitals Entered By: Payton Spark CMA (August 04, 2010 1:46 PM)  O2 Flow:  Room air CC: F/U DM   Primary Care Jamirah Zelaya:  Seymour Bars D.O.  CC:  F/U DM.  History of Present Illness: 60 yo WM presents for f/u T2DM.  He has been on Lantus 20 units once a day and Metformin 1 gram two times a day.  Had labs done in May.  Recently had eye exam that was normal.  Umicro and monofilament are UTD.    He is stil smoking and recently had a cold.  He is due for a flu shot today. Not checking sugars at home.  He is not exercising.    Not due for RFs.  Has testing supplies.  Labs are UTD.   Denies tingling or nubmenss in the extremities.  He denies any CP or DOE.      Current Medications (verified): 1)  Metformin Hcl 1000 Mg  Tabs (Metformin Hcl) .Marland Kitchen.. 1 Tab By Mouth Bid 2)  Aspirin 81 Mg Tbec (Aspirin) .... Take 1 Tablet By Mouth Once A Day 3)  Lisinopril-Hydrochlorothiazide 20-12.5 Mg Tabs (Lisinopril-Hydrochlorothiazide) .Marland Kitchen.. 1 Tab By Mouth Daily 4)  Onetouch Ultra Test  Strp (Glucose Blood) .... Use As Directed Once Daily To Test Blood Sugar 5)  Lantus Solostar 100 Unit/ml Soln (Insulin Glargine) .... 20  Units [place] At Bedtime 6)  Lexapro 20 Mg Tabs (Escitalopram Oxalate) .Marland Kitchen.. 1&1/2 Tab By Mouth Daily 7)  Xanax Xr 8)  Novofine 32g X 6 Mm Misc (Insulin Pen Needle) .... Use As Directed  Allergies (verified): No Known Drug Allergies  Past History:  Past Medical History: Reviewed history from 11/16/2009 and no changes required. CPAP-- not using  hypnosis helped panic attacks  Diabetes mellitus, type II HTN hyperlipidemia depression Bells Palsy 10-2009  psych - Dr Christell Constant  Social History: Reviewed history from 08/09/2008 and no  changes required. Divorced with grown son.  Has a girfriend (but has low libido).  Smokes 2ppd x 25 yrs.  Occas. ETOH.  Retired from [employer].  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 Wants to start walking.  trying to quit smoking -- on Chantix  Review of Systems      See HPI  Physical Exam  General:  alert, well-developed, well-nourished, well-hydrated, and overweight-appearing.   Head:  normocephalic and atraumatic.   Eyes:  eyes watery but conjunctiva clear Ears:  EACs patent; TMs translucent and gray with good cone of light and bony landmarks.  Nose:  clear rhinorrhea Mouth:  o/p slightly injected Neck:  no masses.   Lungs:  Normal respiratory effort, chest expands symmetrically. Lungs are clear to auscultation, no crackles or wheezes. Heart:  Normal rate and regular rhythm. S1 and S2 normal without gallop, murmur, click, rub or other extra sounds. Extremities:  no E/C/C Skin:  color normal.  bilateral hand dermatitis with scaley erythematous hands Cervical Nodes:  No lymphadenopathy noted Psych:  good eye contact, not anxious appearing, and not depressed appearing.     Impression & Recommendations:  Problem # 1:  DIABETES MELLITUS, TYPE II (ICD-250.00) A1C looks great at 6.9, down from 8.1.  Continue Lantus 20 units/ day + Metformin 1 gram two times a day.  Labs updated in May.  Umicro, monofilament done in Feb and eye exam done 04-2010.    RTC for CPE/ EKG/ A1C in 4 mos.  Flu shot today. His updated medication list for this problem includes:    Metformin Hcl 1000 Mg Tabs (Metformin hcl) .Marland Kitchen... 1 tab by mouth bid    Aspirin 81 Mg Tbec (Aspirin) .Marland Kitchen... Take 1 tablet by mouth once a day    Lisinopril-hydrochlorothiazide 20-12.5 Mg Tabs (Lisinopril-hydrochlorothiazide) .Marland Kitchen... 1 tab by mouth daily    Lantus Solostar 100 Unit/ml Soln (Insulin glargine) .Marland Kitchen... 20  units [place] at bedtime  Orders: Fingerstick (36416) Hemoglobin A1C  (95621)  Problem # 2:  DERMATITIS, HANDS (ICD-692.9) Will treat with Triamcinolone ointment, under occlusion at bedtime.   His updated medication list for this problem includes:    Triamcinolone Acetonide 0.5 % Oint (Triamcinolone acetonide) .Marland Kitchen... Apply to hands two times a day  Problem # 3:  ESSENTIAL HYPERTENSION, BENIGN (ICD-401.1) BP high today but he did not take this AM's dose of medication.  Advised compliance. Labs UTD. His updated medication list for this problem includes:    Lisinopril-hydrochlorothiazide 20-12.5 Mg Tabs (Lisinopril-hydrochlorothiazide) .Marland Kitchen... 1 tab by mouth daily  BP today: 142/83 Prior BP: 115/69 (04/03/2010)  Labs Reviewed: K+: 5.3 (04/05/2010) Creat: : 0.75 (04/05/2010)   Chol: 152 (04/05/2010)   HDL: 38 (04/05/2010)   LDL: 91 (04/05/2010)   TG: 114 (04/05/2010)  Problem # 4:  TOBACCO DEPENDENCE (ICD-305.1) Counseled on smoking cessation. Pt in the precontemplative phase of change.  Complete Medication List: 1)  Metformin Hcl 1000 Mg Tabs (Metformin hcl) .Marland Kitchen.. 1 tab by mouth bid 2)  Aspirin 81 Mg Tbec (Aspirin) .... Take 1 tablet by mouth once a day 3)  Lisinopril-hydrochlorothiazide 20-12.5 Mg Tabs (Lisinopril-hydrochlorothiazide) .Marland Kitchen.. 1 tab by mouth daily 4)  Onetouch Ultra Test Strp (Glucose blood) .... Use as directed once daily to test blood sugar 5)  Lantus Solostar 100 Unit/ml Soln (Insulin glargine) .... 20  units [place] at bedtime 6)  Lexapro 20 Mg Tabs (Escitalopram oxalate) .Marland Kitchen.. 1&1/2 tab by mouth daily 7)  Xanax Xr  8)  Novofine 32g X 6 Mm Misc (Insulin pen needle) .... Use as directed 9)  Triamcinolone Acetonide 0.5 % Oint (Triamcinolone acetonide) .... Apply to hands two times a day  Other Orders: Admin 1st Vaccine (30865) Flu Vaccine 51yrs + (78469)  Patient Instructions: 1)  A1C looks great at 6.9. 2)  Work on diabetic diet and regular exercise. 3)  Stay on current meds. 4)  Check AM fasting sugars.  Goal is 80-120. 5)  Flu shot  today. 6)  Use RX steroid ointment 2 x a day. After you put it on at bedtime, cover hands with cotton socks. 7)  REturn for PHYSICAL/ EKG in 4 mos. Prescriptions: TRIAMCINOLONE ACETONIDE 0.5 % OINT (TRIAMCINOLONE ACETONIDE) apply to hands two times a day  #15 g x 1   Entered and Authorized by:   Seymour Bars DO   Signed by:   Seymour Bars DO on 08/04/2010   Method used:   Electronically to        [facility] [phone]* (retail)       [address] [place], [place]  [address]  Ph: [phone]       Fax: [phone]   RxID:   [id number]   Laboratory Results   Blood Tests     HGBA1C: 6.9%   (Normal Range: Non-Diabetic - 3-6%   Control Diabetic - 6-8%)    Flu Vaccine Consent Questions     Do you have a history of severe allergic reactions to this vaccine? no    Any prior history of allergic reactions to egg and/or gelatin? no    Do you have a sensitivity to the preservative Thimersol? no    Do you have a past history of Guillan-Barre Syndrome? no    Do you currently have an acute febrile illness? no    Have you ever had a severe reaction to latex? no    Vaccine information given and explained to patient? yes    Are you currently pregnant? no    Lot Number:[id number]   Exp Date:05/12/2011   Site Given  Left Deltoid IM    HGBA1C: 6.9%   (Normal Range: Non-Diabetic - 3-6%   Control Diabetic - 6-8%)     .lbflu

## 2010-12-12 NOTE — Progress Notes (Signed)
Summary: WANT RX CHANTIX  Phone Note Call from Patient Call back at Home Phone [phone]   Caller: Patient Call For: Seymour Bars DO Summary of Call: PATIENT WANT RX FOR CHANTIX. [facility] [place] Initial call taken by: Harlene Salts,  August 18, 2007 4:09 PM  Follow-up for Phone Call        Pls let pt know that I electronically sent Rx for Chantix starting month and continuing month packs.  He should try to stay on it for 3 mos.  Take with food and a full glass of water.  If depression worsens, stop Chantix and call me.   Follow-up by: Seymour Bars DO,  August 18, 2007 4:17 PM  Additional Follow-up for Phone Call Additional follow up Details #1::        PATIENT INFORMED. Additional Follow-up by: Harlene Salts,  August 18, 2007 4:50 PM    New/Updated Medications: CHANTIX STARTING MONTH PAK 0.5 MG X 11 & 1 MG X 42  MISC (VARENICLINE TARTRATE) take as directed CHANTIX CONTINUING MONTH PAK 1 MG  TABS (VARENICLINE TARTRATE) 1 tab by mouth bid   Prescriptions: CHANTIX CONTINUING MONTH PAK 1 MG  TABS (VARENICLINE TARTRATE) 1 tab by mouth bid  #1 x 2   Entered and Authorized by:   Seymour Bars DO   Signed by:   Seymour Bars DO on 08/18/2007   Method used:   Electronically sent to ...       [facility] [address] [place], [place]  [address]       Ph: [phone]       Fax: [phone]   RxID:   [id number] CHANTIX STARTING MONTH PAK 0.5 MG X 11 & 1 MG X 42  MISC (VARENICLINE TARTRATE) take as directed  #1 x 0   Entered and Authorized by:   Seymour Bars DO   Signed by:   Seymour Bars DO on 08/18/2007   Method used:   Electronically sent to ...       [facility] [address] [place]  [address]       Ph: [phone]       Fax: [phone]   RxID:   [id number]

## 2010-12-12 NOTE — Progress Notes (Signed)
Summary: numbness  Phone Note Call from Patient   Caller: Patient Summary of Call: Pt states that L middle and ring are numb. He woke up on Tues w/ the numbness and it has not improved. Pt thinks its from DM or Bell's Palsy. Pt just wanted you to know and wanted to know if he should be concerned. Please advise Initial call taken by: Payton Spark CMA,  January 12, 2010 2:06 PM  Follow-up for Phone Call        Can we get him in with Dr Gaetano Net tomorrow? Follow-up by: Seymour Bars DO,  January 12, 2010 2:11 PM  Additional Follow-up for Phone Call Additional follow up Details #1::        Pt scheduled Dr. Gaetano Net tomorrow @ 930am. Pt aware. Additional Follow-up by: Payton Spark CMA,  January 12, 2010 3:17 PM

## 2010-12-12 NOTE — Assessment & Plan Note (Signed)
Summary: f/u Bells Palsy   Vital Signs:  Patient profile:   60 year old male Height:      69 inches Weight:      201 pounds BMI:     29.79 O2 Sat:      97 % on Room air Pulse rate:   68 / minute BP sitting:   123 / 74  (left arm) Cuff size:   large  Vitals Entered By: Payton Spark CMA (November 16, 2009 1:22 PM)  O2 Flow:  Room air CC: F/U bell's palsy   Primary Care Provider:  Seymour Bars D.O.  CC:  F/U bell's palsy.  History of Present Illness: 60 yo WM presents for f/u Bell's Palsy (R side) which started 21 days ago acutely.  He finished his prednisone 1 wk after time of diagnosis.   He has started to see some signs of improvement but still has trouble with complete closure of the R eye.  His irritation of the bottom lid has improved with use of antibiotic ointment.  He is not using rewetting drops.  He is able to eat and drink but the R side of the mouth remains drooped at rest.    Current Medications (verified): 1)  Metformin Hcl 1000 Mg  Tabs (Metformin Hcl) .Marland Kitchen.. 1 Tab By Mouth Bid 2)  Aspirin 81 Mg Tbec (Aspirin) .... Take 1 Tablet By Mouth Once A Day 3)  Lisinopril-Hydrochlorothiazide 10-12.5 Mg Tabs (Lisinopril-Hydrochlorothiazide) .... Take 1 Tablet By Mouth Once A Day 4)  Onetouch Ultra Test  Strp (Glucose Blood) .... Use As Directed Once Daily To Test Blood Sugar 5)  Pravachol 40 Mg  Tabs (Pravastatin Sodium) .Marland Kitchen.. 1 Tab By Mouth Qhs 6)  Lantus Solostar 100 Unit/ml Soln (Insulin Glargine) .Marland Kitchen.. 15  Units [place] At Bedtime 7)  Onetouch Ultra System W/device Kit (Blood Glucose Monitoring Suppl) .... Use As Directed Dx:250:00 8)  Lexapro 20 Mg Tabs (Escitalopram Oxalate) .Marland Kitchen.. 1&1/2 Tab By Mouth Daily 9)  Xanax Xr 10)  Novofine 32g X 6 Mm Misc (Insulin Pen Needle) .... Use As Directed  Allergies (verified): No Known Drug Allergies  Past History:  Past Medical History: CPAP-- not using  hypnosis helped panic attacks  Diabetes mellitus, type  II HTN hyperlipidemia depression Bells Palsy 10-2009  psych - Dr Christell Constant  Social History: Reviewed history from 08/09/2008 and no changes required. Divorced with grown son.  Has a girfriend (but has low libido).  Smokes 2ppd x 25 yrs.  Occas. ETOH.  Retired from [employer].  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 Wants to start walking.  trying to quit smoking -- on Chantix  Review of Systems      See HPI  Physical Exam  General:  alert, well-developed, well-nourished, and well-hydrated.   Head:  slight raising of the R eyebrow   Eyes:  almost complete closure of the R eyelid with purposeful movements.  able to keep R eye open better Mouth:  R sided mouth droop at rest.  Unable to smile with marked asymetry. Neck:  full ROM.   Lungs:  Normal respiratory effort, chest expands symmetrically. Lungs are clear to auscultation, no crackles or wheezes. Heart:  Normal rate and regular rhythm. S1 and S2 normal without gallop, murmur, click, rub or other extra sounds. Neurologic:  normal sensation over the face, R and L side Limited movement of the R side of the face over trigemminal distribution with slight improvements from last  visit. Skin:  color normal.   Cervical Nodes:  No lymphadenopathy noted Psych:  good eye contact, not anxious appearing, and not depressed appearing.     Impression & Recommendations:  Problem # 1:  BELL'S PALSY (ICD-351.0) Severe.  Slight improvements at day 21 indicates some hope for spontaneous resolution by 4 months.  He has completed his steroids. Will see him back the end of Feb for f/u. He will use q 1 hr rewetting drops with use of abx ointment at night.    Complete Medication List: 1)  Metformin Hcl 1000 Mg Tabs (Metformin hcl) .Marland Kitchen.. 1 tab by mouth bid 2)  Aspirin 81 Mg Tbec (Aspirin) .... Take 1 tablet by mouth once a day 3)  Lisinopril-hydrochlorothiazide 10-12.5 Mg Tabs (Lisinopril-hydrochlorothiazide)  .... Take 1 tablet by mouth once a day 4)  Onetouch Ultra Test Strp (Glucose blood) .... Use as directed once daily to test blood sugar 5)  Pravachol 40 Mg Tabs (Pravastatin sodium) .Marland Kitchen.. 1 tab by mouth qhs 6)  Lantus Solostar 100 Unit/ml Soln (Insulin glargine) .Marland Kitchen.. 15  units Gem at bedtime 7)  Onetouch Ultra System W/device Kit (Blood glucose monitoring suppl) .... Use as directed dx:250:00 8)  Lexapro 20 Mg Tabs (Escitalopram oxalate) .Marland Kitchen.. 1&1/2 tab by mouth daily 9)  Xanax Xr  10)  Novofine 32g X 6 Mm Misc (Insulin pen needle) .... Use as directed  Patient Instructions: 1)  [facility]ler Grid for macular degeneration testing. 2)  Return the end of Feb for f/u diabetes and bells palsy. 3)  Buy OTC 'rewettng' drops and use in the L eye every hr during the day.  Use RX ointment at bedtime.

## 2010-12-12 NOTE — Letter (Signed)
Summary: Gabriel Gilbert  [facility]   Imported By: Lanelle Bal 05/18/2010 13:08:18  _____________________________________________________________________  External Attachment:    Type:   Image     Comment:   External Document

## 2010-12-14 NOTE — Assessment & Plan Note (Signed)
Summary: CPE   Vital Signs:  Patient profile:   60 year old male Height:      69 inches Weight:      194 pounds BMI:     28.75 O2 Sat:      96 % on Room air Pulse rate:   69 / minute BP sitting:   119 / 74  (left arm) Cuff size:   large  Vitals Entered By: Payton Spark CMA (December 05, 2010 3:33 PM)  O2 Flow:  Room air CC: CPE   Primary Care Provider:  Seymour Bars D.O.  CC:  CPE.  History of Present Illness: 60 yo WM presents for CPE.  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  He is doing fairly well with his meds.  His bells palsy is improving.  He has a girlfriend.  His last tetanus vaccine was in 2007.  His colonoscopy was done in 2007.  He had a pneumovax in 2009.  He had a normal PSA in May 2011; due for DRE.  He had fasting labs in May.  He had an eye exam done in June.    He is doing well on present meds, due for RFs.  He is continuing to smoke and he c/o urinary urgency with some frequency and nocturia, though he consumes a lot of iced tea each day.  He had a normal nuclear stress test with Dr Jens Som in 09 and denies chest pain or DOE.    Current Medications (verified): 1)  Metformin Hcl 1000 Mg  Tabs (Metformin Hcl) .Marland Kitchen.. 1 Tab By Mouth Bid 2)  Aspirin 81 Mg Tbec (Aspirin) .... Take 1 Tablet By Mouth Once A Day 3)  Lisinopril-Hydrochlorothiazide 20-12.5 Mg Tabs (Lisinopril-Hydrochlorothiazide) .Marland Kitchen.. 1 Tab By Mouth Daily 4)  Onetouch Ultra Test  Strp (Glucose Blood) .... Use As Directed Once Daily To Test Blood Sugar 5)  Lantus Solostar 100 Unit/ml Soln (Insulin Glargine) .... 20  Units [place] At Bedtime 6)  Citalopram Hydrobromide 40 Mg Tabs (Citalopram Hydrobromide) .... Take 1 Tab By Mouth Once Daily 7)  Xanax Xr 8)  Novofine 32g X 6 Mm Misc (Insulin Pen Needle) .... Use As Directed 9)  Triamcinolone Acetonide 0.5 % Oint (Triamcinolone Acetonide) .... Apply To Hands Two Times A Day  Allergies (verified): No Known Drug Allergies  Past  History:  Past Medical History: Reviewed history from 11/16/2009 and no changes required. CPAP-- not using  hypnosis helped panic attacks  Diabetes mellitus, type II HTN hyperlipidemia depression Bells Palsy 10-2009  psych - Dr Christell Constant  Past Surgical History: Reviewed history from 09/19/2006 and no changes required. kidney stone removed  PFTs- normal  Family History: Reviewed history from 02/07/2009 and no changes required. father- diabetes, COPD  mother- Alzheimers(?), COPD, HTN  sister- COPD, lung cancer  Social History: Reviewed history from 08/09/2008 and no changes required. Divorced with grown son.  Has a girfriend (but has low libido).  Smokes 2ppd x 25 yrs.  Occas. ETOH.  Retired from [employer].  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 Wants to start walking.  trying to quit smoking -- on Chantix  Review of Systems  The patient denies anorexia, fever, weight loss, weight gain, vision loss, decreased hearing, hoarseness, chest pain, syncope, dyspnea on exertion, peripheral edema, prolonged cough, headaches, hemoptysis, abdominal pain, melena, hematochezia, severe indigestion/heartburn, hematuria, incontinence, genital sores, muscle weakness, suspicious skin lesions, transient blindness, difficulty walking, depression,  unusual weight change, abnormal bleeding, enlarged lymph nodes, angioedema, breast masses, and testicular masses.    Physical Exam  General:  alert, well-developed, well-nourished, well-hydrated, and overweight-appearing.   Head:  normocephalic and atraumatic.   Eyes:  improvement in R eyelid ptosis with full active closure, slight increased sleral injection and watering compared to the L Ears:  EACs patent; TMs translucent and gray with good cone of light and bony landmarks.  Nose:  no nasal discharge.   Mouth:  pharynx pink and moist, fair dentition, and teeth missing.   Neck:  no masses.  no audible carotid  bruits Lungs:  Normal respiratory effort, chest expands symmetrically. Lungs are clear to auscultation, no crackles; upper lobe exp wheezing audible Heart:  Normal rate and regular rhythm. S1 and S2 normal without gallop, murmur, click, rub or other extra sounds. Abdomen:  Bowel sounds positive,abdomen soft and non-tender without masses, organomegaly; no audible AA bruits Rectal:  No external abnormalities noted. Normal sphincter tone. No rectal masses or tenderness. hemoccult neg Prostate:  no nodules, no asymmetry, no induration, and 1+ enlarged.   Msk:  no joint tenderness, no joint swelling, and no joint warmth.   Pulses:  2+ radial and pedal pulses Extremities:  no LE edema Neurologic:  gait normal.   Skin:  color normal and no suspicious lesions.   Cervical Nodes:  No lymphadenopathy noted Psych:  good eye contact, not anxious appearing, and flat affect.     Impression & Recommendations:  Problem # 1:  HEALTH MAINTENANCE EXAM (ICD-V70.0) Keeping healthy checklist for men reviewed. BP at goal. BMI 28 = overwt. Labs updated May 2011, due this May.  A1C 6.6 = excellent control of T2DM, continue current meds. Colonscopy done 07, normal; PNX, Tdap UTD.  PSA UTD. DRE today.  AUA score: 7 with mostly OAB symptoms.  Will try Toviaz samples and cutting down on tea consumption. CXR today for lung cancer screening.  Myoview done 09, normal. Counseled on smoking cessation. Plan to do PFTs this year and may need update myoview and carotid dopplers.  Complete Medication List: 1)  Metformin Hcl 1000 Mg Tabs (Metformin hcl) .Marland Kitchen.. 1 tab by mouth bid 2)  Aspirin 81 Mg Tbec (Aspirin) .... Take 1 tablet by mouth once a day 3)  Lisinopril-hydrochlorothiazide 20-12.5 Mg Tabs (Lisinopril-hydrochlorothiazide) .Marland Kitchen.. 1 tab by mouth daily 4)  Onetouch Ultra Test Strp (Glucose blood) .... Use as directed once daily to test blood sugar 5)  Lantus Solostar 100 Unit/ml Soln (Insulin glargine) .... 20  units [place]  at bedtime 6)  Citalopram Hydrobromide 40 Mg Tabs (Citalopram hydrobromide) .... Take 1 tab by mouth once daily 7)  Xanax Xr  8)  Novofine 32g X 6 Mm Misc (Insulin pen needle) .... Use as directed 9)  Triamcinolone Acetonide 0.5 % Oint (Triamcinolone acetonide) .... Apply to hands two times a day  Other Orders: Fingerstick (36416) Hemoglobin A1C (83036) T-DG Chest 2 View (16109)  Patient Instructions: 1)  A1C looks great at 6.6. 2)  Meds RFd. 3)  Continue current meds. 4)  AUA symptom score for enlarged prostate: 5)  CXR for screening lung cancer today. 6)  Return for f/u in 4 mos.   Prescriptions: CITALOPRAM HYDROBROMIDE 40 MG TABS (CITALOPRAM HYDROBROMIDE) Take 1 tab by mouth once daily  #30 x 6   Entered and Authorized by:   Seymour Bars DO   Signed by:   Seymour Bars DO on 12/05/2010   Method used:   Electronically to  [facility]  [address] [phone]* (retail)       [address]       [place], [place]  [address]       Ph: [phone]       Fax: [phone]   RxID:   [id number] LANTUS SOLOSTAR 100 UNIT/ML SOLN (INSULIN GLARGINE) 20  units [place] at bedtime  #2 boxes x 6   Entered and Authorized by:   Seymour Bars DO   Signed by:   Seymour Bars DO on 12/05/2010   Method used:   Electronically to        [facility]  [phone] (retail)       [address]       [place], [place]  [address]       Ph: [phone]       Fax: [phone]   RxID:   [id number] LISINOPRIL-HYDROCHLOROTHIAZIDE 20-12.5 MG TABS (LISINOPRIL-HYDROCHLOROTHIAZIDE) 1 tab by mouth daily  #30 Tablet x 6   Entered and Authorized by:   Seymour Bars DO   Signed by:   Seymour Bars DO on 12/05/2010   Method used:   Electronically to        [facility] [phone]* (retail)       [address]       [place], [place]  [address]       Ph: [phone]       Fax: [phone]   RxID:   [id number] METFORMIN HCL 1000 MG  TABS (METFORMIN HCL) 1 tab by mouth bid  #60 Tablet x 6   Entered and Authorized by:   Seymour Bars DO   Signed by:   Seymour Bars DO on 12/05/2010   Method used:   Electronically to        [facility]  [address] [phone]* (retail)       [address]       [place], [place]  [address]       Ph: [phone]       Fax: [phone]   RxID:   [id number]    Orders Added: 1)  Fingerstick [36416] 2)  Hemoglobin A1C [83036] 3)  T-DG Chest 2 View [71020] 4)  Est. Patient age 47-64 [72]    Laboratory Results   Blood Tests     HGBA1C: 6.6%   (Normal Range: Non-Diabetic - 3-6%   Control Diabetic - 6-8%)

## 2010-12-15 ENCOUNTER — Ambulatory Visit
Admission: RE | Admit: 2010-12-15 | Discharge: 2010-12-15 | Disposition: A | Discharge status: Home / Self Care | Payer: Self-pay | Source: Ambulatory Visit | Attending: Family Medicine | Admitting: Family Medicine

## 2010-12-15 ENCOUNTER — Other Ambulatory Visit: Payer: Self-pay | Admitting: Family Medicine

## 2010-12-15 DIAGNOSIS — F17200 Nicotine dependence, unspecified, uncomplicated: Secondary | ICD-10-CM

## 2011-01-03 NOTE — Letter (Signed)
Summary: AUA Symptom Score  AUA Symptom Score   Imported By: Kassie Mends 12/27/2010 10:21:13  _____________________________________________________________________  External Attachment:    Type:   Image     Comment:   External Document

## 2011-02-08 ENCOUNTER — Encounter (INDEPENDENT_AMBULATORY_CARE_PROVIDER_SITE_OTHER): Payer: 59 | Admitting: Psychiatry

## 2011-02-08 DIAGNOSIS — F339 Major depressive disorder, recurrent, unspecified: Secondary | ICD-10-CM

## 2011-02-08 DIAGNOSIS — F411 Generalized anxiety disorder: Secondary | ICD-10-CM

## 2011-03-10 ENCOUNTER — Encounter: Payer: Self-pay | Admitting: Family Medicine

## 2011-03-12 ENCOUNTER — Encounter: Payer: Self-pay | Admitting: Family Medicine

## 2011-03-12 ENCOUNTER — Ambulatory Visit (INDEPENDENT_AMBULATORY_CARE_PROVIDER_SITE_OTHER): Payer: 59 | Admitting: Family Medicine

## 2011-03-12 VITALS — BP 122/78 | HR 75 | Temp 98.6°F | Ht 69.0 in | Wt 190.0 lb

## 2011-03-12 DIAGNOSIS — R197 Diarrhea, unspecified: Secondary | ICD-10-CM

## 2011-03-12 DIAGNOSIS — F411 Generalized anxiety disorder: Secondary | ICD-10-CM

## 2011-03-12 DIAGNOSIS — G2581 Restless legs syndrome: Secondary | ICD-10-CM

## 2011-03-12 DIAGNOSIS — F419 Anxiety disorder, unspecified: Secondary | ICD-10-CM

## 2011-03-12 NOTE — Assessment & Plan Note (Signed)
RLS symptoms x months.  Will check iron level w/ labs and if normal, will proceed with treatment, requip.

## 2011-03-12 NOTE — Patient Instructions (Signed)
Restless Legs Syndrome Restless legs syndrome is a sensori-motor (movement) disorder. The symptoms include uncomfortable sensations in the legs. These leg sensations are worse during periods of inactivity or rest. They are also worse while sitting or lying down. There is often a positive family history of the disorder. Individuals that have the disorder describe sensations of:  Pulling.  Drawing.   Crawling.   Worming.  Boring.   Tingling.   Pins and Needles.  Prickly.   Painful.   The sensations are usually accompanied by an overwhelming urge to move the legs. Sudden muscle jerks may also occur. Movement provides temporary relief from the discomfort. In rare cases, the arms may also be affected. Symptoms may interfere with sleep onset (sleep onset insomnia). Research suggests that restless legs syndrome is related to periodic limb movement disorder (PLMD). PLMD is another more common motor disorder. It also causes interrupted sleep. The symptoms often exhibit circadian rhythmicity in their peak occurrence during awakening hours. TREATMENT Treatment for restless legs syndrome is symptomatic. This means that the symptoms are treated. Massage and application of cold compresses may provide temporary relief. Medications effective in relieving the symptoms include:  Temazepam.  Levodopa/Carbidopa.   Bromocriptine.  Pergolide Mesylate.   Oxycodone.  Propoxyphene.   Codeine.  However, many of these medications have side effects. Current research suggests correction of iron deficiency may improve symptoms for some patients. PROGNOSIS Restless legs syndrome is a life-long condition. There is no cure. Symptoms may gradually worsen with age. The most disabling feature is the sleep onset insomnia. It can be severe. Document Released: 10/19/2002 Document Re-Released: 04/18/2010 [facility] Patient Information 2011 [place], [place].    Update labs downstairs today or tomorrow. Will call you w/  results.  May need to add RX meds for RLS. Try Metamusil daily to bulk stools.  Add counseling for anxiety and will rule out other causes with labs. Call if diarrhea has not resolved in 10-14 days.

## 2011-03-12 NOTE — Progress Notes (Signed)
  Subjective:    Patient ID: Gabriel Gilbert, male    DOB: 08/17/1951, 60 y.o.   MRN: [id number]  HPI  60 yo WM presents for diarrhea x 1 wk.  He is going about 1 x a day.  Denies cramping or bloating.  He has had a colonoscopy in the past.  It doesn't seem to matter what he eats.  Denies any blood in the stool.  He has a mild abdominal pain.  He had not constipation previously.  He is under more stress and has been having more anxiety lately.  He has loss of appetite.  Denies N/V/ fevers or chills.   Dr Christell Constant has him on xanax xr, celexa and now wellbutrin.  Sugars running 'good' w/ diabetes.  He also has to move his legs around at night with a crawling pins and needles sensation in both legs.  Occuring for months.  BP 122/78  Pulse 75  Temp(Src) 98.6 F (37 C) (Oral)  Ht 5\' 9"  (1.753 m)  Wt 190 lb (86.183 kg)  BMI 28.06 kg/m2  SpO2 96%  Review of Systems  Constitutional: Negative for fever, chills, appetite change, fatigue and unexpected weight change.  Eyes: Negative for visual disturbance.  Respiratory: Negative for cough and shortness of breath.   Cardiovascular: Negative for chest pain, palpitations and leg swelling.  Gastrointestinal: Positive for diarrhea. Negative for nausea, vomiting, constipation, blood in stool and abdominal distention.  Genitourinary: Negative for difficulty urinating.  Skin: Negative for pallor.  Neurological: Negative for weakness and headaches.  Psychiatric/Behavioral: Negative for dysphoric mood. The patient is nervous/anxious.        Objective:   Physical Exam  Constitutional: He appears well-developed and well-nourished. No distress.  HENT:  Head: Normocephalic and atraumatic.  Eyes: No scleral icterus.  Neck: Neck supple. No thyromegaly present.  Cardiovascular: Normal rate, regular rhythm and normal heart sounds.   Pulmonary/Chest: Effort normal and breath sounds normal. No respiratory distress. He has no wheezes.  Abdominal: Soft. Bowel  sounds are normal. He exhibits distension (mild distension). There is no tenderness. There is no rebound and no guarding.  Musculoskeletal: He exhibits no edema and no tenderness (no calf tendeness).  Skin: Skin is warm and dry. No pallor.  Psychiatric: He has a normal mood and affect.          Assessment & Plan:  Diarrhea x 1 wk, only 1 episode / day likely due to IBS - anxiety induced causes.  Does not seem to correlate with dietary sources.  Will add metamusil daily to bulk stools.  Will add counseling to address his anxiety.  Has f/u with Dr Christell Constant- on psych meds.  Stick to bland diet and check a CBC and CMP with labs today.  F/u results and call if diarrhea has not resolved in 2 wks.

## 2011-03-14 LAB — CBC WITH DIFFERENTIAL/PLATELET
Basophils Absolute: 0 10*3/uL (ref 0.0–0.1)
Lymphocytes Relative: 25 % (ref 12–46)
Lymphs Abs: 2.4 10*3/uL (ref 0.7–4.0)
Neutro Abs: 6.7 10*3/uL (ref 1.7–7.7)
Neutrophils Relative %: 69 % (ref 43–77)
Platelets: 301 10*3/uL (ref 150–400)
RBC: 4.87 MIL/uL (ref 4.22–5.81)
WBC: 9.7 10*3/uL (ref 4.0–10.5)

## 2011-03-14 LAB — COMPLETE METABOLIC PANEL WITH GFR
ALT: 16 U/L (ref 0–53)
AST: 18 U/L (ref 0–37)
CO2: 25 mEq/L (ref 19–32)
Calcium: 9.7 mg/dL (ref 8.4–10.5)
Chloride: 99 mEq/L (ref 96–112)
GFR, Est African American: >60 mL/min (ref >60)
Sodium: 137 mEq/L (ref 135–145)
Total Bilirubin: 0.5 mg/dL (ref 0.3–1.2)
Total Protein: 6.8 g/dL (ref 6.0–8.3)

## 2011-03-15 ENCOUNTER — Telehealth: Payer: Self-pay | Admitting: Family Medicine

## 2011-03-15 NOTE — Telephone Encounter (Signed)
Pls let pt know that his blood counts and chemistry panel came back normal other than his sugar being high at 186,

## 2011-03-16 NOTE — Telephone Encounter (Signed)
Pt aware of the above  

## 2011-03-19 ENCOUNTER — Encounter (INDEPENDENT_AMBULATORY_CARE_PROVIDER_SITE_OTHER): Payer: 59 | Admitting: Psychology

## 2011-03-19 DIAGNOSIS — F331 Major depressive disorder, recurrent, moderate: Secondary | ICD-10-CM

## 2011-03-19 DIAGNOSIS — F411 Generalized anxiety disorder: Secondary | ICD-10-CM

## 2011-03-27 NOTE — Assessment & Plan Note (Signed)
[facility]                            CARDIOLOGY OFFICE NOTE   NAME:Ney, DARIL WARGA                       MRN:          [id number]  DATE:01/07/2008                            DOB:          10-Apr-1951    Mr. Mosco is a 60 year old gentleman with past medical history of  diabetes mellitus, whom I am asked to evaluate for an abnormal  electrocardiogram and dyspnea on exertion.  His he has no prior cardiac  history.  He typically does not have dyspnea with routine activities,  but he does with more extreme activities.  There is no orthopnea, PND or  pedal edema.  Has not had chest pain, palpitations or syncope.  He was  recently seen by Dr. Cathey Endow on routine physical examination.  She did  electrocardiogram.  I have reviewed that and it appears it showed normal  sinus rhythm.  A prior inferior infarct cannot be excluded.  Because of  the above, we were asked to further evaluate.   CURRENT MEDICATIONS:  1. Aspirin 81 mg daily.  2. Actoplus 15/850 two p.o. daily.  3. Klonopin 1 mg tablets one to two p.o. daily.  4. Lisinopril HCT 10/12.5 mg daily.  5. Cymbalta 30 mg daily.   ALLERGIES:  NO KNOWN DRUG ALLERGIES.   SOCIAL HISTORY:  He does smoke.  He does not consume alcohol.   FAMILY HISTORY:  Is negative for coronary artery disease.   PAST MEDICAL HISTORY:  1. Significant for diabetes mellitus.  2. Hypertension.  He denies any hyperlipidemia.  3. He has a history of depression.  4. He has a history of hypnosis treated panic attacks.  5. He has had prior nasal surgery.  6. Surgery for nephrolithiasis.   REVIEW OF SYSTEMS:  There are no headaches or fevers, chills.  There is  no cough or hemoptysis.  There is no dysphagia, odynophagia, melena or  hematochezia. There is no dysuria or hematuria. There is no rash or  seizure activity.  There is no orthopnea, PND or pedal edema.  There is  no claudication.  He does have problems with depression at  times.  Remaining systems are negative.   PHYSICAL EXAMINATION:  Today shows a blood pressure of 138/81.  His  pulse of 78.  Weight is 203 pounds.  Well-developed, well-nourished in  no acute distress.  SKIN: Warm and dry.  He has a flat affect.  There is no peripheral clubbing.  His back is normal.  HEENT: Is normal with normal eyelids.  NECK: Supple, with a normal upstroke bilaterally. Cannot appreciate  bruits. There is no jugular venous distention and no thyromegaly is  noted.  CHEST: Clear to auscultation, normal expansion.  CARDIOVASCULAR: Reveals a regular rate and rhythm, normal S1, S2. There  are no murmurs, rubs or gallops noted. I cannot palpate his PMI.  ABDOMEN: Nontender. Positive bowel sounds. No hepatosplenomegaly. No  masses appreciated. There is no abdominal bruit.  He has 2+ femoral pulses bilaterally. No bruits.  EXTREMITIES: Show no edema.  I can palpate no cords. He has 2+ dorsalis  pedis pulses  bilaterally.  NEUROLOGIC: Examination is grossly intact.   I have reviewed the electrocardiogram from Dr. Ovidio Kin office dated  December 25, 2007.  The patient has normal sinus rhythm with no  significant ST changes.  Prior inferior infarct cannot be excluded.   DIAGNOSES:  1. Abnormal electrocardiogram - As stated above, a prior inferior      infarct cannot be excluded.  We will plan to proceed with stress      Myoview for risk stratification.  This is particularly in light of      his multiple risk factors including tobacco abuse as well as      diabetes mellitus.  If this shows no ischemia then we will not      pursue further cardiac evaluation.  2. Dyspnea on exertion - As per #1, we are risk stratifying with a      stress Myoview.  3. Hypertension - His blood pressure is adequately controlled on his      present medications.  4. Diabetes mellitus - Management per Dr. Cathey Endow.  Note, given that      diabetes mellitus is a coronary artery disease equivalent, he  would      benefit from a statin long term, but I will leave this Dr. Cathey Endow.  5. Tobacco abuse - We discussed the importance of discontinuing this.  6. Depression - Management per Dr. Cathey Endow.   We will see back on as-needed basis pending the results of his Myoview.     Madolyn Frieze Jens Som, MD, [facility]  Electronically Signed    BSC/MedQ  DD: 01/07/2008  DT: 01/08/2008  Job #: [id number]   cc:   Seymour Bars, D.O.

## 2011-03-28 ENCOUNTER — Telehealth: Payer: Self-pay | Admitting: Family Medicine

## 2011-03-28 NOTE — Telephone Encounter (Signed)
Pt called and would like a medication for his restless leg syndrome.  You had talked about Requip in your last note.  Please advise. Plan:  Routed to Dr. Arlice Colt, LPN [facility]

## 2011-03-28 NOTE — Telephone Encounter (Signed)
Pt called and has restless leg syndrome and would like to get script for this.  Please advise. Plan:  Routed to Dr. Cathey Endow. Jarvis Newcomer, LPN [facility]

## 2011-03-29 ENCOUNTER — Telehealth: Payer: Self-pay | Admitting: Family Medicine

## 2011-03-29 MED ORDER — ROPINIROLE HCL 0.5 MG PO TABS: ORAL_TABLET | ORAL | Status: DC

## 2011-03-29 NOTE — Telephone Encounter (Signed)
Yes, I did agree to start Requip for RLS.  Will start with 0.25 mg in the evening for the first 3 days then go up to 0.5 mg in the evenings for 1 wk then go up to 1 mg in the evening until f/u appt.  Call if any problems.

## 2011-03-29 NOTE — Telephone Encounter (Signed)
2nd request sent to Dr. Cathey Endow.  Pt is wanting medication Equip for his restless leg syndrome.  Can this be prescribed??  Please advise. Plan:  Routed to Dr. Arlice Colt, LPN [facility]

## 2011-03-29 NOTE — Telephone Encounter (Signed)
Patient called advise that he has restless leg syndrome and he needs a prescription called into [facility] on [address]. Patient states he called and left a message on vm yesterday.

## 2011-03-30 ENCOUNTER — Telehealth: Payer: Self-pay | Admitting: Family Medicine

## 2011-03-30 NOTE — Telephone Encounter (Signed)
Pt was aware requip sent to his pharm.  Has picked up. Gabriel Newcomer, LPN [facility]

## 2011-04-02 ENCOUNTER — Encounter (INDEPENDENT_AMBULATORY_CARE_PROVIDER_SITE_OTHER): Payer: 59 | Admitting: Psychology

## 2011-04-02 DIAGNOSIS — F331 Major depressive disorder, recurrent, moderate: Secondary | ICD-10-CM

## 2011-04-11 ENCOUNTER — Encounter (INDEPENDENT_AMBULATORY_CARE_PROVIDER_SITE_OTHER): Payer: 59 | Admitting: Psychiatry

## 2011-04-11 DIAGNOSIS — F411 Generalized anxiety disorder: Secondary | ICD-10-CM

## 2011-04-11 DIAGNOSIS — F339 Major depressive disorder, recurrent, unspecified: Secondary | ICD-10-CM

## 2011-04-12 NOTE — Telephone Encounter (Signed)
Closed. Pricsilla Lindvall, LPN /Triage  

## 2011-04-17 ENCOUNTER — Ambulatory Visit (INDEPENDENT_AMBULATORY_CARE_PROVIDER_SITE_OTHER): Payer: 59 | Admitting: Behavioral Health

## 2011-04-17 DIAGNOSIS — F332 Major depressive disorder, recurrent severe without psychotic features: Secondary | ICD-10-CM

## 2011-05-03 ENCOUNTER — Encounter (INDEPENDENT_AMBULATORY_CARE_PROVIDER_SITE_OTHER): Payer: 59 | Admitting: Behavioral Health

## 2011-05-03 DIAGNOSIS — F331 Major depressive disorder, recurrent, moderate: Secondary | ICD-10-CM

## 2011-05-25 ENCOUNTER — Encounter (INDEPENDENT_AMBULATORY_CARE_PROVIDER_SITE_OTHER): Payer: 59 | Admitting: Behavioral Health

## 2011-05-25 DIAGNOSIS — F331 Major depressive disorder, recurrent, moderate: Secondary | ICD-10-CM

## 2011-06-12 ENCOUNTER — Encounter (INDEPENDENT_AMBULATORY_CARE_PROVIDER_SITE_OTHER): Payer: 59 | Admitting: Psychiatry

## 2011-06-12 DIAGNOSIS — F411 Generalized anxiety disorder: Secondary | ICD-10-CM

## 2011-06-12 DIAGNOSIS — F339 Major depressive disorder, recurrent, unspecified: Secondary | ICD-10-CM

## 2011-06-15 ENCOUNTER — Encounter (HOSPITAL_COMMUNITY): Payer: 59 | Admitting: Behavioral Health

## 2011-06-15 ENCOUNTER — Other Ambulatory Visit: Payer: Self-pay | Admitting: Family Medicine

## 2011-07-27 ENCOUNTER — Other Ambulatory Visit: Payer: Self-pay | Admitting: *Deleted

## 2011-07-27 MED ORDER — GLUCOSE BLOOD VI STRP: ORAL_STRIP | Status: DC

## 2011-07-31 ENCOUNTER — Other Ambulatory Visit: Payer: Self-pay | Admitting: *Deleted

## 2011-07-31 ENCOUNTER — Other Ambulatory Visit: Payer: Self-pay | Admitting: Family Medicine

## 2011-07-31 MED ORDER — CITALOPRAM HYDROBROMIDE 40 MG PO TABS: 40.0000 mg | ORAL_TABLET | Freq: Every day | ORAL | Status: DC

## 2011-07-31 MED ORDER — GLUCOSE BLOOD VI STRP: ORAL_STRIP | Status: DC

## 2011-07-31 MED ORDER — METFORMIN HCL 1000 MG PO TABS: 1000.0000 mg | ORAL_TABLET | Freq: Two times a day (BID) | ORAL | Status: DC

## 2011-07-31 NOTE — Telephone Encounter (Signed)
rec fax from [facility] for refills of citalopram 40 mg, metformin 1000mg , one touch ultra strips. Plan:  Refills of all these requests have already been sent electronically today. Jarvis Newcomer, LPN [facility]

## 2011-08-07 ENCOUNTER — Other Ambulatory Visit: Payer: Self-pay | Admitting: Family Medicine

## 2011-08-07 NOTE — Telephone Encounter (Signed)
Requested RF for metformin 1000 mg. Plan:  Already sent on 07-31-11. Jarvis Newcomer, LPN [facility]

## 2011-08-13 ENCOUNTER — Encounter (HOSPITAL_COMMUNITY): Payer: 59 | Admitting: Psychiatry

## 2011-08-15 ENCOUNTER — Encounter (HOSPITAL_COMMUNITY): Payer: 59 | Admitting: Psychiatry

## 2011-09-06 ENCOUNTER — Encounter (HOSPITAL_COMMUNITY): Payer: Self-pay

## 2011-09-11 ENCOUNTER — Other Ambulatory Visit: Payer: Self-pay | Admitting: *Deleted

## 2011-09-11 MED ORDER — LISINOPRIL-HYDROCHLOROTHIAZIDE 20-12.5 MG PO TABS: 1.0000 | ORAL_TABLET | Freq: Every day | ORAL | Status: DC

## 2011-10-01 ENCOUNTER — Ambulatory Visit: Payer: 59

## 2011-10-08 ENCOUNTER — Other Ambulatory Visit: Payer: Self-pay | Admitting: *Deleted

## 2011-10-08 MED ORDER — INSULIN PEN NEEDLE 32G X 6 MM MISC: 1.0000 [IU] | Status: DC

## 2011-10-10 ENCOUNTER — Ambulatory Visit (INDEPENDENT_AMBULATORY_CARE_PROVIDER_SITE_OTHER): Payer: 59 | Admitting: Family Medicine

## 2011-10-10 ENCOUNTER — Ambulatory Visit (INDEPENDENT_AMBULATORY_CARE_PROVIDER_SITE_OTHER): Payer: 59 | Admitting: Psychiatry

## 2011-10-10 VITALS — BP 118/72 | Ht 67.5 in | Wt 203.0 lb

## 2011-10-10 DIAGNOSIS — F411 Generalized anxiety disorder: Secondary | ICD-10-CM

## 2011-10-10 DIAGNOSIS — F329 Major depressive disorder, single episode, unspecified: Secondary | ICD-10-CM

## 2011-10-10 DIAGNOSIS — Z23 Encounter for immunization: Secondary | ICD-10-CM

## 2011-10-10 NOTE — Progress Notes (Signed)
Here for flu vaccine

## 2011-10-10 NOTE — Progress Notes (Signed)
   [facility] Visit  [facility] [birth year]   Subjective: The patient is a 60 year old male who has been followed by [facility] since December of 2009. He is currently diagnosed with depression and anxiety. The patient has some changes since our last appointment. His mom fell and broke her hip approximately 2 months ago. She was in rehabilitation and walker town for 2 weeks. The patient brought her home with him. She is currently bed ridden. The patient takes full care of her. His girlfriend does help somewhat. He is he is the one who bathes her and changes her. His son is back in the household after moving to [facility]. The patient had to go bail the son's dog out of the pound where he was an orientation after biting the son's roommate. This was $173 the patient did not have. The son is no longer allowed to smoke spice while he is in the house. He is currently not working. The patient feels that his anxiety is is still fairly under control. He is asking for more Xanax. I advised him that I will not go any higher.  Filed Vitals:   10/10/11 1104  BP: 118/72    Mental Status Examination  Appearance: Casually dressed, smelling strongly of cigarette smoke Alert: Yes Attention: good  Cooperative: Yes Eye Contact: Fair Speech: Regular rate rhythm and volume Psychomotor Activity: Normal Memory/Concentration: Intact Oriented: person, place, time/date and situation Mood: Euthymic Affect: Congruent Thought Processes and Associations: Logical Fund of Knowledge: Fair Thought Content: No suicidal homicidal thoughts Insight: Fair Judgement: Fair  Diagnosis: Depressive disorder, generalized anxiety disorder  Treatment Plan: At this point we will not change the patient's medications. I will see him back in 3 months.  Jamse Mead, MD

## 2011-10-17 ENCOUNTER — Other Ambulatory Visit (HOSPITAL_COMMUNITY): Payer: Self-pay | Admitting: Psychiatry

## 2011-10-17 DIAGNOSIS — F411 Generalized anxiety disorder: Secondary | ICD-10-CM

## 2011-12-12 ENCOUNTER — Other Ambulatory Visit (HOSPITAL_COMMUNITY): Payer: Self-pay

## 2011-12-12 NOTE — Telephone Encounter (Signed)
Will call to allow early refill.

## 2012-01-07 ENCOUNTER — Other Ambulatory Visit: Payer: Self-pay | Admitting: Family Medicine

## 2012-01-10 ENCOUNTER — Ambulatory Visit (HOSPITAL_COMMUNITY): Payer: 59 | Admitting: Psychiatry

## 2012-01-29 ENCOUNTER — Other Ambulatory Visit: Payer: Self-pay | Admitting: Family Medicine

## 2012-02-05 ENCOUNTER — Encounter (HOSPITAL_COMMUNITY): Payer: Self-pay | Admitting: Psychiatry

## 2012-02-05 ENCOUNTER — Ambulatory Visit (INDEPENDENT_AMBULATORY_CARE_PROVIDER_SITE_OTHER): Payer: 59 | Admitting: Psychiatry

## 2012-02-05 VITALS — BP 102/72 | Ht 67.5 in | Wt 204.0 lb

## 2012-02-05 DIAGNOSIS — F329 Major depressive disorder, single episode, unspecified: Secondary | ICD-10-CM

## 2012-02-05 DIAGNOSIS — F411 Generalized anxiety disorder: Secondary | ICD-10-CM

## 2012-02-05 NOTE — Progress Notes (Signed)
   [facility] Follow-up Outpatient Visit  Gabriel [facility] [birth year]   Subjective: The patient is a 61 year old male who has been followed by [facility] since December of 2009. He is currently diagnosed with depression and anxiety. The patient's mother died on 12-13-22. He's had some issues. He can't find her will. He is concerned about financial issues. His mother's bank accounts of been close. The patient is in the process of being named executor. His son still lives in a house. He pays no rent. He asked for money daily. The patient reports low motivation. He states he sits around a lot and doesn't feel like doing anything. He doesn't keep the house picked up.he still seeing his girlfriend, but the girlfriend does not like the son and will not come around. He is asking advice on how to get his son out of the house.  Filed Vitals:   02/05/12 1422  BP: 102/72    Mental Status Examination  Appearance: Casually dressed, smelling strongly of cigarette smoke Alert: Yes Attention: good  Cooperative: Yes Eye Contact: Fair Speech: Regular rate rhythm and volume Psychomotor Activity: Normal Memory/Concentration: Intact Oriented: person, place, time/date and situation Mood: Euthymic Affect: Congruent Thought Processes and Associations: Logical Fund of Knowledge: Fair Thought Content: No suicidal homicidal thoughts Insight: Fair Judgement: Fair  Diagnosis: Depressive disorder, generalized anxiety disorder  Treatment Plan: At this point we will not change the patient's medications. Patient will tell son either he pays rent or he moves. I will see him back in 2 months.  Jamse Mead, MD

## 2012-02-06 ENCOUNTER — Other Ambulatory Visit: Payer: Self-pay | Admitting: Family Medicine

## 2012-02-11 ENCOUNTER — Other Ambulatory Visit: Payer: Self-pay | Admitting: Family Medicine

## 2012-02-12 ENCOUNTER — Other Ambulatory Visit: Payer: Self-pay

## 2012-02-13 ENCOUNTER — Ambulatory Visit: Payer: Self-pay

## 2012-02-14 ENCOUNTER — Ambulatory Visit (INDEPENDENT_AMBULATORY_CARE_PROVIDER_SITE_OTHER): Payer: 59 | Admitting: Family Medicine

## 2012-02-14 ENCOUNTER — Encounter: Payer: Self-pay | Admitting: Family Medicine

## 2012-02-14 DIAGNOSIS — E669 Obesity, unspecified: Secondary | ICD-10-CM

## 2012-02-14 DIAGNOSIS — F17200 Nicotine dependence, unspecified, uncomplicated: Secondary | ICD-10-CM

## 2012-02-14 DIAGNOSIS — E119 Type 2 diabetes mellitus without complications: Secondary | ICD-10-CM

## 2012-02-14 DIAGNOSIS — I10 Essential (primary) hypertension: Secondary | ICD-10-CM

## 2012-02-14 DIAGNOSIS — Z720 Tobacco use: Secondary | ICD-10-CM

## 2012-02-14 LAB — POCT GLYCOSYLATED HEMOGLOBIN (HGB A1C): Hemoglobin A1C: 7.5

## 2012-02-14 MED ORDER — INSULIN GLARGINE 100 UNIT/ML ~~LOC~~ SOLN: 28.0000 [IU] | Freq: Every day | SUBCUTANEOUS | Status: DC

## 2012-02-14 NOTE — Patient Instructions (Signed)
Smoking Cessation, Tips for Success     YOU CAN QUIT SMOKING   If you are ready to quit smoking, congratulations! You have chosen to help yourself be healthier. Cigarettes bring nicotine, tar, carbon monoxide, and other irritants into your body. Your lungs, heart, and blood vessels will be able to work better without these poisons. There are many different ways to quit smoking. Nicotine gum, nicotine patches, a nicotine inhaler, or nicotine nasal spray can help with physical craving. Hypnosis, support groups, and medicines help break the habit of smoking. Here are some tips to help you quit for good.     . Throw away all cigarettes.   . Clean and remove all ashtrays from your home, work, and car.   . On a card, write down your reasons for quitting. Carry the card with you and read it when you get the urge to smoke.   . Cleanse your body of nicotine. Drink enough water and fluids to keep your urine clear or pale yellow. Do this after quitting to flush the nicotine from your body.   . Learn to predict your moods. Do not let a bad situation be your excuse to have a cigarette. Some situations in your life might tempt you into wanting a cigarette.   . Never have "just one" cigarette. It leads to wanting another and another. Remind yourself of your decision to quit.   . Change habits associated with smoking. If you smoked while driving or when feeling stressed, try other activities to replace smoking. Stand up when drinking your coffee. Brush your teeth after eating. Sit in a different chair when you read the paper. Avoid alcohol while trying to quit, and try to drink fewer caffeinated beverages. Alcohol and caffeine may urge you to smoke.   . Avoid foods and drinks that can trigger a desire to smoke, such as sugary or spicy foods and alcohol.   . Ask people who smoke not to smoke around you.   . Have something planned to do right after eating or having a cup of coffee. Take a walk or exercise to perk you up. This will  help to keep you from overeating.   . Try a relaxation exercise to calm you down and decrease your stress. Remember, you may be tense and nervous for the first 2 weeks after you quit, but this will pass.   . Find new activities to keep your hands busy. Play with a pen, coin, or rubber band. Doodle or draw things on paper.   . Brush your teeth right after eating. This will help cut down on the craving for the taste of tobacco after meals. You can try mouthwash, too.   . Use oral substitutes, such as lemon drops, carrots, a cinnamon stick, or chewing gum, in place of cigarettes. Keep them handy so they are available when you have the urge to smoke.   . When you have the urge to smoke, try deep breathing.   . Designate your home as a nonsmoking area.   . If you are a heavy smoker, ask your caregiver about a prescription for nicotine chewing gum. It can ease your withdrawal from nicotine.   . Reward yourself. Set aside the cigarette money you save and buy yourself something nice.   . Look for support from others. Join a support group or smoking cessation program. Ask someone at home or at work to help you with your plan to quit smoking.   . Always ask   yourself, "Do I need this cigarette or is this just a reflex?" Tell yourself, "Today, I choose not to smoke," or "I do not want to smoke." You are reminding yourself of your decision to quit, even if you do smoke a cigarette.    HOW WILL I FEEL WHEN I QUIT SMOKING?     . The benefits of not smoking start within days of quitting.   . You may have symptoms of withdrawal because your body is used to nicotine (the addictive substance in cigarettes). You may crave cigarettes, be irritable, feel very hungry, cough often, get headaches, or have difficulty concentrating.   . The withdrawal symptoms are only temporary. They are strongest when you first quit but will go away within 10 to 14 days.   . When withdrawal symptoms occur, stay in control. Think about your reasons for  quitting. Remind yourself that these are signs that your body is healing and getting used to being without cigarettes.   . Remember that withdrawal symptoms are easier to treat than the major diseases that smoking can cause.   . Even after the withdrawal is over, expect periodic urges to smoke. However, these cravings are generally short-lived and will go away whether you smoke or not. Do not smoke!   . If you relapse and smoke again, do not lose hope. Most smokers quit 3 times before they are successful.   . If you relapse, do not give up! Plan ahead and think about what you will do the next time you get the urge to smoke.    LIFE AS A NONSMOKER: MAKE IT FOR A MONTH, MAKE IT FOR LIFE     Day 1: Hang this page where you will see it every day.   Day 2: Get rid of all ashtrays, matches, and lighters.   Day 3: Drink water. Breathe deeply between sips.   Day 4: Avoid places with smoke-filled air, such as bars, clubs, or the smoking section of restaurants.   Day 5: Keep track of how much money you save by not smoking.   Day 6: Avoid boredom. Keep a good book with you or go to the movies.   Day 7: Reward yourself! One week without smoking!   Day 8: Make a dental appointment to get your teeth cleaned.   Day 9: Decide how you will turn down a cigarette before it is offered to you.   Day 10: Review your reasons for quitting.   Day 11: Distract yourself. Stay active to keep your mind off smoking and to relieve tension. Take a walk, exercise, read a book, do a crossword puzzle, or try a new hobby.   Day 12: Exercise. Get off the bus before your stop or use stairs instead of escalators.   Day 13: Call on friends for support and encouragement.   Day 14: Reward yourself! Two weeks without smoking!   Day 15: Practice deep breathing exercises.   Day 16: Bet a friend that you can stay a nonsmoker.   Day 17: Ask to sit in nonsmoking sections of restaurants.   Day 18: Hang up "No Smoking" signs.   Day 19: Think of yourself as a  nonsmoker.   Day 20: Each morning, tell yourself you will not smoke.   Day 21: Reward yourself! Three weeks without smoking!   Day 22: Think of smoking in negative ways. Remember how it stains your teeth, gives you bad breath, and leaves you short of breath.   Day   23: Eat a nutritious breakfast.   Day 24:Do not relive your days as a smoker.   Day 25: Hold a pencil in your hand when talking on the telephone.   Day 26: Tell all your friends you do not smoke.   Day 27: Think about how much better food tastes.   Day 28: Remember, one cigarette is one too many.   Day 29: Take up a hobby that will keep your hands busy.   Day 30: Congratulations! One month without smoking! Give yourself a big reward.     Your caregiver can direct you to community resources or hospitals for support, which may include:   . Group support.   . Education.   . Hypnosis.   . Subliminal therapy.      Document Released: 07/27/2004 Document Revised: 10/18/2011 Document Reviewed: 08/15/2009   [employer] Patient Information 2012 [employer].

## 2012-02-14 NOTE — Progress Notes (Signed)
  Subjective:    Patient ID: [facility], male    DOB: 1951/07/07, 61 y.o.   MRN: [id number]  Diabetes He presents for his follow-up diabetic visit. He has type 2 diabetes mellitus. His disease course has been stable. Pertinent negatives for diabetes include no blurred vision, no foot paresthesias, no foot ulcerations, no polydipsia, no polyphagia and no polyuria. Symptoms are stable. Risk factors for coronary artery disease include male sex, obesity and sedentary lifestyle. When asked about current treatments, none were reported. He is compliant with treatment all of the time. His weight is stable. He rarely participates in exercise. There is no change in his home blood glucose trend. His breakfast blood glucose range is generally 140-180 mg/dl. An ACE inhibitor/angiotensin II receptor blocker is being taken.   Has felt down since FEb when his mother passed away.  He was her primary caretaker.    Hypertension-no chest pain or shortness of breath. He is not physically active. He really does not watch his diet. He says he typically drinks 2 large pieces at [facility] for breakfast and usually does back at lunchtime for a coffee shake, he rarely eats breakfast. He is taking his medications regularly and denies any side effects.   Review of Systems  Eyes: Negative for blurred vision.  Genitourinary: Negative for polyuria.  Hematological: Negative for polydipsia and polyphagia.       Objective:   Physical Exam  Constitutional: He is oriented to person, place, and time. He appears well-developed and well-nourished.  HENT:  Head: Normocephalic and atraumatic.  Cardiovascular: Normal rate, regular rhythm and normal heart sounds.        No carotid bruits.   Pulmonary/Chest: Effort normal and breath sounds normal.  Neurological: He is alert and oriented to person, place, and time.  Skin: Skin is warm and dry.  Psychiatric: He has a normal mood and affect. His behavior is normal.            Assessment & Plan:  DM- A1C is 7.5.  Continue metformin and inc lantus lantus to 28 units.  F/U in 3 months.  Work on diet ane exercise. Cut back on sugar in the diet. Add some protein for his breakfast.  Foot exam performed today. Normal.  Urine micro pending.    Acute situational depression/Grieving from the loss of his mother. He does see Dr. Christell Constant but asked him to consider counseling.  He will think about it  HTN- well controlled. No CP or SOB.  F/u in 3 months.   Tobacco abuse I then discussed the need for smoking cessation. Handout given. He says he is thinking about doing the electronic cigarette. He is ready been putting filters on his cigarettes to help reduce the tar intake.

## 2012-02-20 ENCOUNTER — Telehealth (HOSPITAL_COMMUNITY): Payer: Self-pay

## 2012-02-21 NOTE — Telephone Encounter (Signed)
Is to take 300 mg of Wellbutrin XL. Keep next appointment.

## 2012-02-22 LAB — COMPLETE METABOLIC PANEL WITH GFR
ALT: 12 U/L (ref 0–53)
AST: 19 U/L (ref 0–37)
Alkaline Phosphatase: 59 U/L (ref 39–117)
BUN: 14 mg/dL (ref 6–23)
Creat: 0.91 mg/dL (ref 0.50–1.35)
Total Bilirubin: 0.4 mg/dL (ref 0.3–1.2)

## 2012-02-22 LAB — LIPID PANEL
Cholesterol: 130 mg/dL (ref 0–200)
HDL: 40 mg/dL (ref >39)
LDL Cholesterol: 68 mg/dL (ref 0–99)
Total CHOL/HDL Ratio: 3.3 Ratio
Triglycerides: 110 mg/dL (ref <150)
VLDL: 22 mg/dL (ref 0–40)

## 2012-02-25 ENCOUNTER — Other Ambulatory Visit: Payer: Self-pay | Admitting: *Deleted

## 2012-02-25 MED ORDER — INSULIN GLARGINE 100 UNIT/ML ~~LOC~~ SOLN: 28.0000 [IU] | Freq: Every day | SUBCUTANEOUS | Status: DC

## 2012-02-28 ENCOUNTER — Other Ambulatory Visit: Payer: Self-pay | Admitting: Family Medicine

## 2012-04-08 ENCOUNTER — Ambulatory Visit (HOSPITAL_COMMUNITY): Payer: Self-pay | Admitting: Psychiatry

## 2012-04-08 ENCOUNTER — Other Ambulatory Visit: Payer: Self-pay | Admitting: Family Medicine

## 2012-04-11 ENCOUNTER — Ambulatory Visit (HOSPITAL_COMMUNITY): Payer: Self-pay | Admitting: Psychiatry

## 2012-04-13 ENCOUNTER — Other Ambulatory Visit (HOSPITAL_COMMUNITY): Payer: Self-pay | Admitting: Psychiatry

## 2012-04-13 DIAGNOSIS — F411 Generalized anxiety disorder: Secondary | ICD-10-CM

## 2012-04-16 ENCOUNTER — Ambulatory Visit (INDEPENDENT_AMBULATORY_CARE_PROVIDER_SITE_OTHER): Payer: 59 | Admitting: Psychiatry

## 2012-04-16 ENCOUNTER — Encounter (HOSPITAL_COMMUNITY): Payer: Self-pay | Admitting: Psychiatry

## 2012-04-16 VITALS — BP 110/70 | Ht 67.5 in | Wt 210.0 lb

## 2012-04-16 DIAGNOSIS — F411 Generalized anxiety disorder: Secondary | ICD-10-CM

## 2012-04-16 DIAGNOSIS — F329 Major depressive disorder, single episode, unspecified: Secondary | ICD-10-CM

## 2012-04-16 NOTE — Progress Notes (Signed)
   [facility] Follow-up Outpatient Visit  DEAMONTE SAYEGH 07-27-1951   Subjective: The patient is a 61 year old male who has been followed by [facility] since December of 2009. He is currently diagnosed with depression and anxiety. The patient reports today that his son is to the house. His son does have a job and is working 30 hours a week. 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 There has been some conflict with a neighbor over his son's dog. The patient is questioning his purpose in life. He feels kind of lost since his mother died. He has been seeing a Data processing manager at hospice. He does not feel like his medication is doing anything. He is asking to come off his antidepressants, and to go up on his Xanax. I advised him that I do not prescribe Xanax as a monotherapy. I will also not go up on it at this time.  Filed Vitals:   04/16/12 1453  BP: 110/70    Mental Status Examination  Appearance: Casually dressed, smelling strongly of cigarette smoke Alert: Yes Attention: good  Cooperative: Yes Eye Contact: Fair Speech: Regular rate rhythm and volume Psychomotor Activity: Normal Memory/Concentration: Intact Oriented: person, place, time/date and situation Mood: Euthymic Affect: Congruent Thought Processes and Associations: Logical Fund of Knowledge: Fair Thought Content: No suicidal homicidal thoughts Insight: Fair Judgement: Fair  Diagnosis: Depressive disorder, generalized anxiety disorder  Treatment Plan: We will to decrease the Celexa to 20 mg a day for 2 weeks and then stop it. Patient is continue Wellbutrin XL and Xanax. I will see him back in 6 weeks. He may call with concerns.  Jamse Mead, MD

## 2012-05-15 ENCOUNTER — Other Ambulatory Visit (HOSPITAL_COMMUNITY): Payer: Self-pay | Admitting: Psychiatry

## 2012-05-16 ENCOUNTER — Ambulatory Visit: Payer: Self-pay | Admitting: Family Medicine

## 2012-05-20 ENCOUNTER — Ambulatory Visit (INDEPENDENT_AMBULATORY_CARE_PROVIDER_SITE_OTHER): Payer: 59 | Admitting: Family Medicine

## 2012-05-20 ENCOUNTER — Encounter: Payer: Self-pay | Admitting: Family Medicine

## 2012-05-20 VITALS — BP 128/75 | HR 55 | Ht 69.0 in | Wt 210.0 lb

## 2012-05-20 DIAGNOSIS — IMO0001 Reserved for inherently not codable concepts without codable children: Secondary | ICD-10-CM

## 2012-05-20 DIAGNOSIS — Z720 Tobacco use: Secondary | ICD-10-CM

## 2012-05-20 DIAGNOSIS — F17200 Nicotine dependence, unspecified, uncomplicated: Secondary | ICD-10-CM

## 2012-05-20 DIAGNOSIS — E119 Type 2 diabetes mellitus without complications: Secondary | ICD-10-CM

## 2012-05-20 DIAGNOSIS — E669 Obesity, unspecified: Secondary | ICD-10-CM

## 2012-05-20 DIAGNOSIS — I10 Essential (primary) hypertension: Secondary | ICD-10-CM

## 2012-05-20 NOTE — Progress Notes (Signed)
  Subjective:    Patient ID: [facility], male    DOB: 08/10/1951, 61 y.o.   MRN: [id number]  Diabetes He presents for his follow-up diabetic visit. He has type 2 diabetes mellitus. His disease course has been stable. Associated symptoms include foot ulcerations and visual change. Pertinent negatives for diabetes include no foot paresthesias, no polydipsia, no polyphagia, no polyuria and no weight loss. (Had cataract surgery) Symptoms are stable. He is compliant with treatment most of the time. His weight is stable. Diabetic current diet: skips breakfast and lunch and drinks sweet tea. He rarely participates in exercise. (Doesnt check sugars. ) An ACE inhibitor/angiotensin II receptor blocker is being taken. Eye exam is current.   he says he has quit eating candy at night.  Tob abuse - He is using e-cigs now and says has really helped. Has been doing that for a month.  Says he can smell better.   HTN - Ran out of BP meds 4 days ago.  Says plans to pick it up.  No chest pain or shortness of breath.   Review of Systems  Constitutional: Negative for weight loss.  Genitourinary: Negative for polyuria.  Hematological: Negative for polydipsia and polyphagia.       Objective:   Physical Exam  Constitutional: He is oriented to person, place, and time. He appears well-developed and well-nourished.  HENT:  Head: Normocephalic and atraumatic.  Neck: Neck supple. No thyromegaly present.  Cardiovascular: Normal rate, regular rhythm and normal heart sounds.   Pulmonary/Chest: Effort normal and breath sounds normal.  Musculoskeletal: He exhibits no edema.  Lymphadenopathy:    He has no cervical adenopathy.  Neurological: He is alert and oriented to person, place, and time.  Skin: Skin is warm and dry.  Psychiatric: He has a normal mood and affect. His behavior is normal.          Assessment & Plan:  DM - Encouraged him to actually eat breakfast and lunch instead of skipping and drinking  tea adn coffee.  Given H.O on diabetes and exericse. Strongly encouraged him to get some protein for breakfast, yolk free egg would be perfect.  We discussed the importance of exercise. He is extremely sedentary now. Recommended 30 minutes, 5 days per week. Certainly start within days of. I would like him to really work on this instead of adjusting his medication. Followup in 3 months. His A1c is the liver 7 at that point time in we'll need to adjust his insulin. Am somewhat hesitant to do that considering he does not eat breakfast or lunch.  HTN - Blood pressure looks good today.  Continue current regimen.  Tob abuse - doing well on nicotine replacement right now. Encouraged him to continue to work on weaning down.   Obesity - Encouraged regular exercise. Work on regular exercise and low-fat diet.  He is seeing Dr. Christell Constant downstairs for his depression. Feels his current regimen is not working. I encouraged him to discuss this with her.

## 2012-05-20 NOTE — Patient Instructions (Signed)
Diabetes and Exercise Diabetes mellitus is a common, chronic disease, in which the pancreas is unable to adequately control blood glucose (sugar) levels. There are 2 types of diabetes. Type 1 diabetes patients are unable to produce insulin, a hormone that causes sugar in the blood to be stored in the body. People with type 1 diabetes may compensate by giving themselves injections of insulin. Type 2 diabetes involves not producing adequate amounts of insulin to control blood glucose levels. People with type 2 diabetes control their blood glucose by monitoring their food intake or by taking medicine. Exercise is an important part of diabetes treatment. During exercise, the muscles use a greater amount of glucose from the blood for energy. This lowers your blood glucose, which is the same effect you would get from taking insulin. It has been shown that endurance athletes are more sensitive to insulin than inactive people. SYMPTOMS   Many people with a mild case of diabetes have no symptoms. However, if left uncontrolled, diabetes can lead to several complications that could be prevented with treatment of the disease. General symptoms of diabetes include:    Frequent urination (polyuria).   Frequent thirst and drinking (polydipsia).   Increased food consumption (polyphagia).   Fatigue.   Poor exercise performance.   Blurred vision.   Inflammation of the vagina (vaginitis) caused by fungal infections.   Skin infections (uncommon).   Numbness in the feet,caused by nerve injury.   Kidney disease.  CAUSES   The cause of most cases of diabetes is unknown. In children, diabetes is often due to an autoimmune response to the cells in the pancreas that make insulin. It is also linked with other diseases, such as cystic fibrosis. Diabetes may have a genetic link. PREVENTION  Athletes should strive to begin exercise with blood glucose in a well-controlled state.   Feet should always be kept clean and  dry.   Activities in which low blood sugar levels cannot be treated easily (scuba diving, rock climbing, swimming) should be avoided.   Anticipate alterations in diet or training to avoid low blood sugar (hypoglycemia) and high blood sugar (hyperglycemia).   Athletes should try to increase sugar consumption after strenuous exercise to avoid hypoglycemia.   Short-acting insulin should not be injected into an actively exercising muscle. The athlete should rest the injection site for about 1 hour after exercise.   Patients with diabetes should get routine checkups of the feet to prevent complications.  PROGNOSIS   Exercise provides many benefits to the person with diabetes:    Reduced body fat.   Lower blood pressure.   Often, reduced need for medicines.   Improved exercise tolerance.   Lower insulin levels.   Weight loss.   Improved lipid profile (decreased cholesterol and low-density lipoproteins).  RELATED COMPLICATIONS   If performed incorrectly, exercise can result in complications of diabetes:    Poor control of blood sugar, when exercise is performed at the wrong time.   Increase in renal disease, from loss of body fluids (dehydration).   Increased risk of nerve injury (neuropathy) when performing exercises that increase foot injury.   Increased risk of eye problems when performingactivities that involve breath holding or lowering or jarring the head.   Increased risk of sudden death from exercise in patients with heart disease.   Worsening of hypertension with heavy lifting (more than 10 lb/4.5 kg). Altered blood glucose and insulin dose as a result of mild illness that produces loss of appetite.   Altered   uptake of insulin after injection when insulin injection site is changed.  NOTE: Exercise can lower blood glucose effectively, but the effects are short-lasting (no more than a couple of days). Exercise has been shown to improve your sensitivity to insulin. This may  alter how your body responds to a given dose of injected insulin. It is important for every patient with diabetes to know how his or her body may react to exercise, and to adjust insulin dosages accordingly. TREATMENT  Eat about 1 to 3 hours before exercise.   Check blood glucose immediately before and after exercise.   Stop exercise if blood glucose is more than 250 mg/dL.   Stop exercise if blood glucose is less than 100 mg/dL.   Do not exercise within 1 hour of an insulin injection.   Be prepared to treat low blood glucose while exercising. Keep some sugar product with you, such as a candy bar.   For prolonged exercise, use a sports drink to maintain your glucose level.   Replace used up glucose in the body after exercise.   Consume fluids during and after exercise to avoid dehydration.  SEEK MEDICAL CARE IF:  You have vision changes after a run.   You notice a loss of sensation in your feet after exercise.   You have increased numbness, tingling, or pins and needles sensations after exercise.   You have chest pain during or after exercise.   You have a fast, irregular heartbeat (palpitations) during or after exercise.   Your exercise tolerance gets worse.   You have fainting or dizzy spells for brief periods during or after exercise.  Document Released: 10/29/2005 Document Revised: 10/18/2011 Document Reviewed: 02/10/2009 [employer] Patient Information 2012 [employer]. 

## 2012-05-28 ENCOUNTER — Ambulatory Visit (INDEPENDENT_AMBULATORY_CARE_PROVIDER_SITE_OTHER): Payer: 59 | Admitting: Psychiatry

## 2012-05-28 ENCOUNTER — Encounter (HOSPITAL_COMMUNITY): Payer: Self-pay | Admitting: Psychiatry

## 2012-05-28 VITALS — BP 118/78 | Ht 67.5 in | Wt 210.0 lb

## 2012-05-28 DIAGNOSIS — F329 Major depressive disorder, single episode, unspecified: Secondary | ICD-10-CM

## 2012-05-28 DIAGNOSIS — F411 Generalized anxiety disorder: Secondary | ICD-10-CM

## 2012-05-28 MED ORDER — SERTRALINE HCL 50 MG PO TABS: ORAL_TABLET | ORAL | Status: DC

## 2012-05-28 NOTE — Progress Notes (Signed)
   [facility] Behavioral Health Follow-up Outpatient Visit  Gabriel Gilbert May 27, 1951   Subjective: The patient is a 61 year old male who has been followed by [facility] since December of 2009. He is currently diagnosed with depression and anxiety. The patient reports today that his son is still in the house. His son does have a job and is working 30 hours a week. The son is now on Adderall the spine on the street. The patient continues to not do anything. Finances are very tight. He is still contemplating his purpose. Patient never discontinued his Celexa. He maintained his at one half pill a day. He is asking if we can try a different antidepressant today. He still endorses anxiety and depression. Sleep is fair. Appetite is okay. There's no suicidal thoughts.  Filed Vitals:   05/28/12 1545  BP: 118/78    Mental Status Examination  Appearance: Casually dressed, smelling strongly of cigarette smoke Alert: Yes Attention: good  Cooperative: Yes Eye Contact: Fair Speech: Regular rate rhythm and volume Psychomotor Activity: Normal Memory/Concentration: Intact Oriented: person, place, time/date and situation Mood: Euthymic Affect: Congruent Thought Processes and Associations: Logical Fund of Knowledge: Fair Thought Content: No suicidal homicidal thoughts Insight: Fair Judgement: Fair  Diagnosis: Depressive disorder, generalized anxiety disorder  Treatment Plan: I will discontinue the Celexa. I will start Zoloft. Patient is continue Wellbutrin XL and Xanax. I will see him back in 6 weeks. He may call with concerns.  Jamse Mead, MD

## 2012-06-04 ENCOUNTER — Other Ambulatory Visit: Payer: Self-pay | Admitting: Family Medicine

## 2012-06-19 ENCOUNTER — Other Ambulatory Visit: Payer: Self-pay | Admitting: Family Medicine

## 2012-07-09 ENCOUNTER — Ambulatory Visit (INDEPENDENT_AMBULATORY_CARE_PROVIDER_SITE_OTHER): Payer: 59 | Admitting: Psychiatry

## 2012-07-09 ENCOUNTER — Encounter (HOSPITAL_COMMUNITY): Payer: Self-pay | Admitting: Psychiatry

## 2012-07-09 VITALS — BP 110/78 | Ht 67.5 in | Wt 206.0 lb

## 2012-07-09 DIAGNOSIS — F329 Major depressive disorder, single episode, unspecified: Secondary | ICD-10-CM

## 2012-07-09 DIAGNOSIS — F411 Generalized anxiety disorder: Secondary | ICD-10-CM

## 2012-07-09 MED ORDER — SERTRALINE HCL 100 MG PO TABS: ORAL_TABLET | ORAL | Status: DC

## 2012-07-09 NOTE — Progress Notes (Signed)
   [facility] Behavioral Health Follow-up Outpatient Visit  Gabriel Gilbert Mar 31, 1951   Subjective: The patient is a 61 year old male who has been followed by [facility] since December of 2009. He is currently diagnosed with depression and anxiety. The patient reports today that his son is still in the house. The patient refuses to give his son money for spice. The patient is actually down 4 pounds today. His primary care physician advised him that if he did not lose weight, she would have to cope and its insulin. He cannot afford it. He is trying to lose. He reports he has not been smoking for 3 months. He is using electronic cigarettes. He continues to have any motivation. He sleeps most of the day. He continues to have no purpose in life. He denies any suicidal thoughts. He cannot tell any difference with the Zoloft.  Filed Vitals:   07/09/12 1510  BP: 110/78    Mental Status Examination  Appearance: Casually dressed. Alert: Yes Attention: good  Cooperative: Yes Eye Contact: Fair Speech: Regular rate rhythm and volume Psychomotor Activity: Normal Memory/Concentration: Intact Oriented: person, place, time/date and situation Mood: Euthymic Affect: Congruent Thought Processes and Associations: Logical Fund of Knowledge: Fair Thought Content: No suicidal homicidal thoughts Insight: Fair Judgement: Fair  Diagnosis: Depressive disorder, generalized anxiety disorder  Treatment Plan: I will increase Zoloft to 100 mg daily. Patient is continue Wellbutrin XL and Xanax. I will see him back in 6 weeks. He may call with concerns.  Jamse Mead, MD

## 2012-08-20 ENCOUNTER — Ambulatory Visit (INDEPENDENT_AMBULATORY_CARE_PROVIDER_SITE_OTHER): Payer: 59 | Admitting: Psychiatry

## 2012-08-20 ENCOUNTER — Encounter (HOSPITAL_COMMUNITY): Payer: Self-pay | Admitting: Psychiatry

## 2012-08-20 VITALS — BP 108/78 | Ht 67.5 in | Wt 206.0 lb

## 2012-08-20 DIAGNOSIS — F329 Major depressive disorder, single episode, unspecified: Secondary | ICD-10-CM

## 2012-08-20 DIAGNOSIS — F411 Generalized anxiety disorder: Secondary | ICD-10-CM

## 2012-08-20 MED ORDER — SERTRALINE HCL 100 MG PO TABS: 150.0000 mg | ORAL_TABLET | Freq: Every day | ORAL | Status: DC

## 2012-08-20 NOTE — Progress Notes (Signed)
   [facility] Behavioral Health Follow-up Outpatient Visit  Gabriel Gilbert 08-Feb-1951   Subjective: The patient is a 61 year old male who has been followed by [facility] since December of 2009. He is currently diagnosed with depression and anxiety. At his last appointment, I increased his Zoloft to 100 mg daily and continue his Wellbutrin XL and Xanax. He presents today. His son is still in the home. He had to loan his son money to buy spice. The son is to be paying him back this Friday. The patient applied to volunteer at the hospital, but never heard anything back. He is still not smoking. He is sleeping well. Anxiety is about the same. He has maintained his weight at 206. Patient's girlfriend is now working at [employer]. She is in the end, and it is hard for her to be handling meats that she does not eat herself. She is looking for a job at her nursing home. Patient is not actively looking for work. Filed Vitals:   08/20/12 1453  BP: 108/78   Mental Status Examination  Appearance: Casually dressed. Alert: Yes Attention: good  Cooperative: Yes Eye Contact: Fair Speech: Regular rate rhythm and volume Psychomotor Activity: Normal Memory/Concentration: Intact Oriented: person, place, time/date and situation Mood: Euthymic Affect: Congruent Thought Processes and Associations: Logical Fund of Knowledge: Fair Thought Content: No suicidal homicidal thoughts Insight: Fair Judgement: Fair  Diagnosis: Depressive disorder, generalized anxiety disorder  Treatment Plan: I will increase Zoloft to 150 mg daily. Patient is continue Wellbutrin XL and Xanax. I will see him back in 6 weeks. He may call with concerns.  Jamse Mead, MD

## 2012-08-21 ENCOUNTER — Ambulatory Visit: Payer: Self-pay | Admitting: Family Medicine

## 2012-08-21 DIAGNOSIS — Z0289 Encounter for other administrative examinations: Secondary | ICD-10-CM

## 2012-08-31 ENCOUNTER — Other Ambulatory Visit: Payer: Self-pay | Admitting: Family Medicine

## 2012-09-02 ENCOUNTER — Encounter: Payer: Self-pay | Admitting: Family Medicine

## 2012-09-02 ENCOUNTER — Ambulatory Visit (INDEPENDENT_AMBULATORY_CARE_PROVIDER_SITE_OTHER): Payer: 59 | Admitting: Family Medicine

## 2012-09-02 VITALS — BP 125/69 | HR 66 | Ht 69.0 in | Wt 202.0 lb

## 2012-09-02 DIAGNOSIS — F329 Major depressive disorder, single episode, unspecified: Secondary | ICD-10-CM

## 2012-09-02 DIAGNOSIS — F17200 Nicotine dependence, unspecified, uncomplicated: Secondary | ICD-10-CM

## 2012-09-02 DIAGNOSIS — F32A Depression, unspecified: Secondary | ICD-10-CM

## 2012-09-02 DIAGNOSIS — Z23 Encounter for immunization: Secondary | ICD-10-CM

## 2012-09-02 DIAGNOSIS — E119 Type 2 diabetes mellitus without complications: Secondary | ICD-10-CM

## 2012-09-02 DIAGNOSIS — Z720 Tobacco use: Secondary | ICD-10-CM

## 2012-09-02 DIAGNOSIS — I10 Essential (primary) hypertension: Secondary | ICD-10-CM

## 2012-09-02 NOTE — Progress Notes (Signed)
  Subjective:    Patient ID: Gabriel Gilbert, male    DOB: [birth year], 61 y.o.   MRN: [id number]  HPI DM- Checking sugars 2-3 times per week. Says sugars running around 120.  No foot sores.  Worried aobut cost of his lantus. Has been working on his diet.  Using e -cig. No exercise.  No hypoglycemic events.   HTN - No CP or SOB.  Taking meds reguarlarly. NO S.E.  Still no exercise.    Chronic Depression - D.r Christell Constant changed the sertraline to 150 form 100. Uses xanax TID.  Hs f/u next months.  Seeing therapist here in [place],  who was concerned about his memory loss. She had wondered if he is on ay meds that could be contributing.     Review of Systems     Objective:   Physical Exam  Constitutional: He is oriented to person, place, and time. He appears well-developed and well-nourished.  HENT:  Head: Normocephalic and atraumatic.  Cardiovascular: Normal rate, regular rhythm and normal heart sounds.   Pulmonary/Chest: Effort normal and breath sounds normal.  Neurological: He is alert and oriented to person, place, and time.  Skin: Skin is warm and dry.  Psychiatric: He has a normal mood and affect. His behavior is normal.          Assessment & Plan:  DM-Controlled.Continue current regimen. Congratulated him on his diet changes. Still need to work on exercise and weight loss. I explained if can really work on exercise may even be able to lower his need for insulin and that will help with costs.  Ok to call for samples in a week or two.   F/U in 3 months.  Lab Results  Component Value Date   HGBA1C 6.8 09/02/2012    HTN- Well controlled. Continue current regimen. F/U in 6 monhs.  Lipids well controlled with meds Lab Results  Component Value Date   CHOL 130 02/21/2012   HDL 40 02/21/2012   LDLCALC 68 02/21/2012   TRIG 110 02/21/2012   CHOLHDL 3.3 02/21/2012     Memory loss - Sees a therapist in [place] and psych Dr. Christell Constant. On wellbutrin and sertraline. Unclear not on a stating or  other meds than can commonly cause memory loss. He does report a hx fo ECT tx years ago and says they worked well.   Chornic depresion - Keep f/u with Dr. Christell Constant and let her know if meds not working.   Tob Abuse- Using e cig to wean off nicotine.  Continue to work on this.    Flu vaccine given.

## 2012-09-22 ENCOUNTER — Other Ambulatory Visit: Payer: Self-pay | Admitting: Family Medicine

## 2012-09-23 ENCOUNTER — Other Ambulatory Visit: Payer: Self-pay | Admitting: *Deleted

## 2012-09-23 MED ORDER — GLUCOSE BLOOD VI STRP: ORAL_STRIP | Status: DC

## 2012-10-01 ENCOUNTER — Ambulatory Visit (HOSPITAL_COMMUNITY): Payer: Self-pay | Admitting: Psychiatry

## 2012-10-13 ENCOUNTER — Other Ambulatory Visit: Payer: Self-pay | Admitting: Family Medicine

## 2012-10-16 ENCOUNTER — Other Ambulatory Visit (HOSPITAL_COMMUNITY): Payer: Self-pay | Admitting: Psychiatry

## 2012-11-19 ENCOUNTER — Ambulatory Visit (HOSPITAL_COMMUNITY): Payer: 59 | Admitting: Psychiatry

## 2012-12-04 ENCOUNTER — Ambulatory Visit (INDEPENDENT_AMBULATORY_CARE_PROVIDER_SITE_OTHER): Payer: 59 | Admitting: Family Medicine

## 2012-12-04 ENCOUNTER — Encounter: Payer: Self-pay | Admitting: Family Medicine

## 2012-12-04 VITALS — BP 107/63 | HR 65 | Wt 201.0 lb

## 2012-12-04 DIAGNOSIS — I10 Essential (primary) hypertension: Secondary | ICD-10-CM

## 2012-12-04 DIAGNOSIS — F17200 Nicotine dependence, unspecified, uncomplicated: Secondary | ICD-10-CM

## 2012-12-04 DIAGNOSIS — E119 Type 2 diabetes mellitus without complications: Secondary | ICD-10-CM

## 2012-12-04 DIAGNOSIS — Z720 Tobacco use: Secondary | ICD-10-CM

## 2012-12-04 MED ORDER — LISINOPRIL-HYDROCHLOROTHIAZIDE 10-12.5 MG PO TABS: 1.0000 | ORAL_TABLET | Freq: Every day | ORAL | Status: DC

## 2012-12-04 NOTE — Progress Notes (Signed)
  Subjective:    Patient ID: Gabriel Gilbert, male    DOB: [birth year], 62 y.o.   MRN: [id number]  HPI  DM - - Well controlled.  No lows.  No cuts or sores that are healing well. He reports taking his medications regularly. He used 28 units of Lantus daily. He's also taking his metformin daily.   Tob abuse - has been trying to e-cig to quit.    HTN -  Pt denies chest pain, SOB, dizziness, or heart palpitations.  Taking meds as directed w/o problems.  Denies medication side effects.  Denies any side effects with medications.   Review of Systems     Objective:   Physical Exam  Constitutional: He is oriented to person, place, and time. He appears well-developed and well-nourished.  HENT:  Head: Normocephalic and atraumatic.  Cardiovascular: Normal rate, regular rhythm and normal heart sounds.   Pulmonary/Chest: Effort normal and breath sounds normal.  Neurological: He is alert and oriented to person, place, and time.  Skin: Skin is warm and dry.  Psychiatric: He has a normal mood and affect. His behavior is normal.          Assessment & Plan:  DM well-controlled. Hemoglobin A1c is 6.8 today. Continue current regimen. Followup in 3 months. Due for BMP today. Exam and urine microalbumin are up-to-date. He is not currently on a statin. His levels all look fantastic. I will discuss with him at the followup visit. He does take a baby aspirin daily.  Tobacco abuse-he is working on cessation with e- cigarettes.  HTN- his blood pressures actually little low today. I would like to decrease his blood pressure pill medication to lisinopril 10 and hydrochlorothiazide 12.5 mg. Followup in 3 months to make sure that his blood pressures coming up a little bit. Call if any problems. Currently he is asymptomatic. No dizziness or vertigo-type symptoms.

## 2012-12-05 ENCOUNTER — Encounter: Payer: Self-pay | Admitting: *Deleted

## 2012-12-05 ENCOUNTER — Other Ambulatory Visit: Payer: Self-pay | Admitting: Family Medicine

## 2012-12-05 DIAGNOSIS — E119 Type 2 diabetes mellitus without complications: Secondary | ICD-10-CM

## 2012-12-05 LAB — BASIC METABOLIC PANEL WITH GFR
CO2: 31 mEq/L (ref 19–32)
Calcium: 9.7 mg/dL (ref 8.4–10.5)
Glucose, Bld: 89 mg/dL (ref 70–99)
Sodium: 141 mEq/L (ref 135–145)

## 2012-12-05 NOTE — Progress Notes (Signed)
Quick Note:  All labs are normal. ______ 

## 2012-12-10 ENCOUNTER — Ambulatory Visit (HOSPITAL_COMMUNITY): Payer: Self-pay | Admitting: Psychiatry

## 2013-01-08 ENCOUNTER — Ambulatory Visit (INDEPENDENT_AMBULATORY_CARE_PROVIDER_SITE_OTHER): Payer: 59 | Admitting: Psychiatry

## 2013-01-08 ENCOUNTER — Encounter (HOSPITAL_COMMUNITY): Payer: Self-pay | Admitting: Psychiatry

## 2013-01-08 VITALS — BP 100/62 | Ht 67.5 in | Wt 200.0 lb

## 2013-01-08 DIAGNOSIS — F411 Generalized anxiety disorder: Secondary | ICD-10-CM

## 2013-01-08 DIAGNOSIS — F331 Major depressive disorder, recurrent, moderate: Secondary | ICD-10-CM

## 2013-01-08 MED ORDER — DIVALPROEX SODIUM ER 500 MG PO TB24: 500.0000 mg | ORAL_TABLET | Freq: Every day | ORAL | Status: DC

## 2013-01-08 NOTE — Progress Notes (Signed)
   [facility] Behavioral Health Follow-up Outpatient Visit  Gabriel Gilbert 18-Dec-1950   Subjective: The patient is a 62 year old male who has been followed by [facility] since December of 2009. He is currently diagnosed with depression and anxiety. At his last appointment, I increased his Zoloft to 150 mg daily and continue his Wellbutrin XL and Xanax. He presents today. The patient stopped his Wellbutrin XL on his own. He felt like it was to have to cost, and did not make any difference. He has been more irritable all the time. He refers to himself as aggressive, but he has not been physical with anyone. He is concerned about financial issues. His food stamps have been stopped. His bank account is fully being depleted. His son went to [facility] for an evaluation. They wanted him admitted to the hospital. The son was starting a new job, and this was more important. He should be getting his first paycheck. His son's pitbull has attacked the son twice now. The patient is scared of him. He has a gun accessible in case the dog turns vicious. The patient has been on Depakote in the past. He is also been on lithium. He can't remember how they worked. His weight is down 6 pounds today. According to his primary care physician's last note, his diabetes mellitus is being well-managed. Filed Vitals:   01/08/13 1124  BP: 100/62   Mental Status Examination  Appearance: Casually dressed. Alert: Yes Attention: good  Cooperative: Yes Eye Contact: Fair Speech: Regular rate rhythm and volume Psychomotor Activity: Normal Memory/Concentration: Intact Oriented: person, place, time/date and situation Mood: Euthymic Affect: Congruent Thought Processes and Associations: Logical Fund of Knowledge: Fair Thought Content: No suicidal homicidal thoughts Insight: Fair Judgement: Fair  Diagnosis: Depressive disorder, generalized anxiety disorder  Treatment Plan: I will continue Zoloft and  Xanax. I will start Depakote ER 500 mg at bedtime. I will see the patient back in one month. Patient is provided financial assistance forms for both himself and his son. Patient may call with concerns. Jamse Mead, MD

## 2013-01-19 ENCOUNTER — Other Ambulatory Visit: Payer: Self-pay | Admitting: Family Medicine

## 2013-02-05 ENCOUNTER — Encounter (HOSPITAL_COMMUNITY): Payer: Self-pay | Admitting: Psychiatry

## 2013-02-05 ENCOUNTER — Ambulatory Visit (INDEPENDENT_AMBULATORY_CARE_PROVIDER_SITE_OTHER): Payer: 59 | Admitting: Psychiatry

## 2013-02-05 VITALS — BP 108/70 | Ht 67.5 in | Wt 202.0 lb

## 2013-02-05 DIAGNOSIS — F329 Major depressive disorder, single episode, unspecified: Secondary | ICD-10-CM

## 2013-02-05 DIAGNOSIS — F331 Major depressive disorder, recurrent, moderate: Secondary | ICD-10-CM

## 2013-02-05 DIAGNOSIS — F411 Generalized anxiety disorder: Secondary | ICD-10-CM

## 2013-02-05 MED ORDER — SERTRALINE HCL 50 MG PO TABS: 50.0000 mg | ORAL_TABLET | Freq: Every day | ORAL | Status: DC

## 2013-02-05 MED ORDER — DIVALPROEX SODIUM ER 500 MG PO TB24: 1000.0000 mg | ORAL_TABLET | Freq: Every day | ORAL | Status: DC

## 2013-02-05 NOTE — Progress Notes (Signed)
[facility] Behavioral Health Follow-up Outpatient Visit  Gabriel Gilbert [birth year]   Subjective: The patient is a 62 year old male who has been followed by [facility] since December of 2009. He is currently diagnosed with depression and anxiety. At his last appointment, I started Depakote to help with irritability and anger. I continued his Zoloft and Xanax. The patient presents today. He is 8 minutes late. He showed up yesterday thinking the appointment was then. The patient reports he stopped the Zoloft. He wasn't sure if he was put to continue it or not. He has not noticed much difference with the Depakote ER. He and his son got into an argument recently. His son's dog became confused. The dog attacked his son. His hand was injured. The patient pull the dog off his son. The patient's hand was also injured in the process. His son continues to use spice. The patient is very angry and upset when discussing it. The patient's ex-wife did bring some money last week to help with his son, but only dropped off $170. He feels as though his son of psychotic. His son has been followed by a psychiatrist here in town. The patient reports he has to pay for appointments and medication and that's why his ex-wife brought money. The patient is still not smoking. He is no longer dating his girlfriend. He won't elaborate, but states they went their separate ways. He still concern over finances. He is $13,000 at the bank. He doesn't feel like the Xanax makes any difference. He believes that psychological. Filed Vitals:   02/05/13 1458  BP: 108/70   Active Ambulatory Problems    Diagnosis Date Noted  . DIABETES MELLITUS, TYPE II 03/13/2007  . DEPRESSION, MAJOR, RECURRENT 08/20/2006  . ANXIETY 08/20/2006  . TOBACCO DEPENDENCE 08/20/2006  . BELL'S PALSY 10/26/2009  . CORTICAL SENILE CATARACT 05/09/2009  . Essential Hypertension, Benign 08/20/2006  . KIDNEY STONE - NEPHROLITHIASIS 08/20/2006  .  DERMATITIS, HANDS 08/04/2010  . LUMBAR RADICULOPATHY 11/26/2008  . SLEEP APNEA 08/20/2006  . INCONTINENCE, URGE 03/13/2007  . RLS (restless legs syndrome) 03/12/2011  . Obesity 02/14/2012   Resolved Ambulatory Problems    Diagnosis Date Noted  . HYPERLIPIDEMIA 08/20/2006   Past Medical History  Diagnosis Date  . Diabetes mellitus   . Hypertension   . Hyperlipidemia   . Depression    Current Outpatient Prescriptions on File Prior to Visit  Medication Sig Dispense Refill  . ALPRAZolam (XANAX) 1 MG tablet take 1 tablet by mouth three times a day  90 tablet  5  . aspirin 81 MG tablet Take 81 mg by mouth daily.        . B-D UF III MINI PEN NEEDLES 31G X 5 MM MISC use as directed  100 each  3  . glucose blood (ONE TOUCH TEST STRIPS) test strip Pt testing one time a day.  100 each  3  . insulin glargine (LANTUS) 100 UNIT/ML injection Inject 28 Units into the skin at bedtime.  3 mL  6  . Insulin Pen Needle (NOVOFINE) 32G X 6 MM MISC 1 Units by Does not apply route as directed. by Does not apply route as directed.  100 each  3  . lisinopril-hydrochlorothiazide (PRINZIDE,ZESTORETIC) 10-12.5 MG per tablet Take 1 tablet by mouth daily.  90 tablet  1  . metFORMIN (GLUCOPHAGE) 1000 MG tablet take 1 tablet by mouth twice a day with meals  60 tablet  2  . ONE TOUCH ULTRA  TEST test strip TEST as directed  100 each  3   No current facility-administered medications on file prior to visit.   Review of Systems - History obtained from the patient General ROS: negative for - sleep disturbance or weight loss Psychological ROS: positive for - anxiety, depression and irritability Neurological ROS: negative for - confusion, headaches or seizures  Mental Status Examination  Appearance: Casually dressed. Alert: Yes Attention: good  Cooperative: Yes Eye Contact: Fair Speech: Regular rate rhythm and volume Psychomotor Activity: Normal Memory/Concentration: Intact Oriented: person, place, time/date and  situation Mood: Euthymic Affect: Congruent Thought Processes and Associations: Logical Fund of Knowledge: Fair Thought Content: No suicidal homicidal thoughts Insight: Fair Judgement: Fair  Diagnosis: Depressive disorder, generalized anxiety disorder  Treatment Plan: I will restart Zoloft. I will increase Depakote ER to 1000 mg at bedtime. I will continue the patient's Xanax. I will see the patient back in 6 weeks. I will check a Depakote level at that time. Patient may call with concerns. Patient is to look again for his financial assistance forms. Jamse Mead, MD

## 2013-03-09 ENCOUNTER — Ambulatory Visit: Payer: Self-pay | Admitting: Family Medicine

## 2013-03-09 DIAGNOSIS — Z0289 Encounter for other administrative examinations: Secondary | ICD-10-CM

## 2013-03-19 ENCOUNTER — Encounter (HOSPITAL_COMMUNITY): Payer: Self-pay | Admitting: Psychiatry

## 2013-03-19 ENCOUNTER — Ambulatory Visit (INDEPENDENT_AMBULATORY_CARE_PROVIDER_SITE_OTHER): Payer: 59 | Admitting: Psychiatry

## 2013-03-19 VITALS — BP 110/70 | Ht 67.5 in | Wt 200.0 lb

## 2013-03-19 DIAGNOSIS — F329 Major depressive disorder, single episode, unspecified: Secondary | ICD-10-CM

## 2013-03-19 DIAGNOSIS — F331 Major depressive disorder, recurrent, moderate: Secondary | ICD-10-CM

## 2013-03-19 DIAGNOSIS — F411 Generalized anxiety disorder: Secondary | ICD-10-CM

## 2013-03-19 DIAGNOSIS — F3289 Other specified depressive episodes: Secondary | ICD-10-CM

## 2013-03-19 MED ORDER — SERTRALINE HCL 100 MG PO TABS: 100.0000 mg | ORAL_TABLET | Freq: Every day | ORAL | Status: DC

## 2013-03-19 NOTE — Progress Notes (Signed)
[facility] Follow-up Outpatient Visit  Gabriel Gilbert 05/09/1951   Subjective: The patient is a 62 year old male who has been followed by [facility] since December of 2009. He is currently diagnosed with depression and anxiety. At his last appointment, I increased his Depakote to 1000 mg at bedtime. I also restarted his Zoloft. The patient presents today. His son had a bad breakdown this week. His dog attacked him several more times. The son called the police who called animal control. Animal control came and took his dog he was euthanized. Patient son got a moped. He got upset last night and threw it to the ground and now is not working. Dad is made him a therapy appointment for next Monday. The patient is currently very worried about finances. He no longer qualifies for food stamps. He's been getting his groceries from the food bank. He only has $11,000 inheritance left. He has applied for disability, and is still waiting. His anxiety is still bad. He states he is immune to the Xanax. He reports he's having memory issues. He states he cannot stay on task. Filed Vitals:   03/19/13 1433  BP: 110/70   Active Ambulatory Problems    Diagnosis Date Noted  . DIABETES MELLITUS, TYPE II 03/13/2007  . DEPRESSION, MAJOR, RECURRENT 08/20/2006  . ANXIETY 08/20/2006  . TOBACCO DEPENDENCE 08/20/2006  . BELL'S PALSY 10/26/2009  . CORTICAL SENILE CATARACT 05/09/2009  . Essential Hypertension, Benign 08/20/2006  . KIDNEY STONE - NEPHROLITHIASIS 08/20/2006  . DERMATITIS, HANDS 08/04/2010  . LUMBAR RADICULOPATHY 11/26/2008  . SLEEP APNEA 08/20/2006  . INCONTINENCE, URGE 03/13/2007  . RLS (restless legs syndrome) 03/12/2011  . Obesity 02/14/2012   Resolved Ambulatory Problems    Diagnosis Date Noted  . HYPERLIPIDEMIA 08/20/2006   Past Medical History  Diagnosis Date  . Diabetes mellitus   . Hypertension   . Hyperlipidemia   . Depression    Current Outpatient  Prescriptions on File Prior to Visit  Medication Sig Dispense Refill  . ALPRAZolam (XANAX) 1 MG tablet take 1 tablet by mouth three times a day  90 tablet  5  . aspirin 81 MG tablet Take 81 mg by mouth daily.        . B-D UF III MINI PEN NEEDLES 31G X 5 MM MISC use as directed  100 each  3  . divalproex (DEPAKOTE ER) 500 MG 24 hr tablet Take 2 tablets (1,000 mg total) by mouth daily.  60 tablet  2  . glucose blood (ONE TOUCH TEST STRIPS) test strip Pt testing one time a day.  100 each  3  . insulin glargine (LANTUS) 100 UNIT/ML injection Inject 28 Units into the skin at bedtime.  3 mL  6  . Insulin Pen Needle (NOVOFINE) 32G X 6 MM MISC 1 Units by Does not apply route as directed. by Does not apply route as directed.  100 each  3  . lisinopril-hydrochlorothiazide (PRINZIDE,ZESTORETIC) 10-12.5 MG per tablet Take 1 tablet by mouth daily.  90 tablet  1  . metFORMIN (GLUCOPHAGE) 1000 MG tablet take 1 tablet by mouth twice a day with meals  60 tablet  2  . ONE TOUCH ULTRA TEST test strip TEST as directed  100 each  3   No current facility-administered medications on file prior to visit.   Review of Systems - General ROS: negative for - sleep disturbance or weight gain Psychological ROS: positive for - anxiety and depression Cardiovascular ROS: no  chest pain or dyspnea on exertion Musculoskeletal ROS: negative for - gait disturbance or muscular weakness Neurological ROS: negative for - headaches or seizures  Mental Status Examination  Appearance: Casually dressed. Alert: Yes Attention: good  Cooperative: Yes Eye Contact: Fair Speech: Regular rate rhythm and volume Psychomotor Activity: Normal Memory/Concentration: Intact Oriented: person, place, time/date and situation Mood: Euthymic Affect: Congruent Thought Processes and Associations: Logical Fund of Knowledge: Fair Thought Content: No suicidal homicidal thoughts Insight: Fair Judgement: Fair  Diagnosis: Depressive disorder,  generalized anxiety disorder  Treatment Plan: I will increase Zoloft 100 mg daily. I will continue the Depakote ER 1000 mg at bedtime. I will also continue the Xanax as needed. I will order a Depakote level along with urine drug screen today. I will schedule the patient back in one month with Dr. Laury Deep.  Jamse Mead MD

## 2013-03-20 LAB — DRUG SCREEN, URINE
Benzodiazepines.: POSITIVE — AB
Cocaine Metabolites: NEGATIVE
Creatinine,U: 34.75 mg/dL
Opiates: NEGATIVE
Phencyclidine (PCP): NEGATIVE
Propoxyphene: NEGATIVE

## 2013-04-17 ENCOUNTER — Other Ambulatory Visit (HOSPITAL_COMMUNITY): Payer: Self-pay | Admitting: Psychiatry

## 2013-04-17 NOTE — Telephone Encounter (Signed)
Called patient's pharmacy.  Requested refill of his medication 3 day supply, Alprazolam 1 mg TID. #9 no refills. Pharmacist states she would call the patient and inform him he has a 3 day supply with a refill.

## 2013-04-20 ENCOUNTER — Ambulatory Visit (INDEPENDENT_AMBULATORY_CARE_PROVIDER_SITE_OTHER): Payer: 59 | Admitting: Psychiatry

## 2013-04-20 ENCOUNTER — Encounter (HOSPITAL_COMMUNITY): Payer: Self-pay | Admitting: Psychiatry

## 2013-04-20 VITALS — BP 135/84 | HR 66 | Ht 67.5 in | Wt 200.0 lb

## 2013-04-20 DIAGNOSIS — F411 Generalized anxiety disorder: Secondary | ICD-10-CM

## 2013-04-20 DIAGNOSIS — F332 Major depressive disorder, recurrent severe without psychotic features: Secondary | ICD-10-CM

## 2013-04-20 MED ORDER — SERTRALINE HCL 100 MG PO TABS: 150.0000 mg | ORAL_TABLET | Freq: Every day | ORAL | Status: DC

## 2013-04-20 MED ORDER — ALPRAZOLAM 1 MG PO TABS: 1.0000 mg | ORAL_TABLET | Freq: Three times a day (TID) | ORAL | Status: DC | PRN

## 2013-04-20 MED ORDER — ALPRAZOLAM 0.5 MG PO TABS: ORAL_TABLET | ORAL | Status: DC
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
## 2013-04-21 DIAGNOSIS — F332 Major depressive disorder, recurrent severe without psychotic features: Secondary | ICD-10-CM | POA: Insufficient documentation

## 2013-04-21 DIAGNOSIS — F411 Generalized anxiety disorder: Secondary | ICD-10-CM | POA: Insufficient documentation

## 2013-04-23 ENCOUNTER — Other Ambulatory Visit: Payer: Self-pay | Admitting: Family Medicine

## 2013-04-24 ENCOUNTER — Telehealth: Payer: Self-pay | Admitting: Family Medicine

## 2013-04-24 NOTE — Telephone Encounter (Signed)
Have him shcedule f/u DM appt.

## 2013-04-24 NOTE — Telephone Encounter (Signed)
Unable to lvm. No vm set up.Gabriel Gilbert [place]

## 2013-04-30 ENCOUNTER — Other Ambulatory Visit: Payer: Self-pay | Admitting: Family Medicine

## 2013-05-04 NOTE — Telephone Encounter (Signed)
Pt called appt made pt would like to discuss going back on CPAP.Gabriel KitchenLoralee Pacas [place]

## 2013-05-18 ENCOUNTER — Ambulatory Visit (INDEPENDENT_AMBULATORY_CARE_PROVIDER_SITE_OTHER): Payer: 59 | Admitting: Family Medicine

## 2013-05-18 ENCOUNTER — Encounter: Payer: Self-pay | Admitting: Family Medicine

## 2013-05-18 VITALS — BP 136/73 | HR 68 | Ht 69.0 in | Wt 198.0 lb

## 2013-05-18 DIAGNOSIS — R0683 Snoring: Secondary | ICD-10-CM

## 2013-05-18 DIAGNOSIS — G4733 Obstructive sleep apnea (adult) (pediatric): Secondary | ICD-10-CM

## 2013-05-18 DIAGNOSIS — F32A Depression, unspecified: Secondary | ICD-10-CM

## 2013-05-18 DIAGNOSIS — R0989 Other specified symptoms and signs involving the circulatory and respiratory systems: Secondary | ICD-10-CM

## 2013-05-18 DIAGNOSIS — F329 Major depressive disorder, single episode, unspecified: Secondary | ICD-10-CM

## 2013-05-18 DIAGNOSIS — E119 Type 2 diabetes mellitus without complications: Secondary | ICD-10-CM

## 2013-05-18 DIAGNOSIS — R0609 Other forms of dyspnea: Secondary | ICD-10-CM

## 2013-05-18 LAB — POCT GLYCOSYLATED HEMOGLOBIN (HGB A1C): Hemoglobin A1C: 7

## 2013-05-18 MED ORDER — ATORVASTATIN CALCIUM 40 MG PO TABS: 40.0000 mg | ORAL_TABLET | Freq: Every day | ORAL | Status: DC

## 2013-05-18 NOTE — Patient Instructions (Addendum)
Remember to go for your labs when you are fasting.

## 2013-05-18 NOTE — Progress Notes (Signed)
  Subjective:    Patient ID: Gabriel Gilbert, male    DOB: 1951-03-07, 62 y.o.   MRN: [id number]  HPI DM- Sugars at highest riding 140. Has been using 2-3 units less of lantus bc of cost.  No hypoglycemic events. No wounds that aren't healing well. On E-cigs.   OSA - Last test in the 80s and hasn't used the machine. Says stuck in the closet for years. Thinks it might be time to get re-tested and try to tx his OSA> Still snores.   HTN- Pt denies chest pain, SOB, dizziness, or heart palpitations.  Taking meds as directed w/o problems.  Denies medication side effects.     Depression - see ing psych.   Review of Systems     Objective:   Physical Exam  Constitutional: He is oriented to person, place, and time. He appears well-developed and well-nourished.  HENT:  Head: Normocephalic and atraumatic.  Cardiovascular: Normal rate, regular rhythm and normal heart sounds.   Pulmonary/Chest: Effort normal and breath sounds normal.  Musculoskeletal: He exhibits no edema.  Neurological: He is alert and oriented to person, place, and time.  Skin: Skin is warm and dry.  Psychiatric: He has a normal mood and affect. His behavior is normal.          Assessment & Plan:  DM - A1C is 7.0.  Well controlled.  Go back up to usual dose on lantus  He now has a coupon card to use so this should help him to not have to cut back on med dose bc of cost. .  F/U in 3 mo.  On ASA, ACE and needs to be on a statin.  Discussed need to start a statin. Will send new rx. Discussed possible S.E. Call if any prlblems. Reminded him ot get his eye exam done. He says he was waitig for his sugars to come down some.   OSA - Will order home sleep lab for further evaluation.  Discussed potential health risks fo not tx his OSA.    HTN- well controlled.

## 2013-05-19 ENCOUNTER — Ambulatory Visit (HOSPITAL_COMMUNITY): Payer: Self-pay | Admitting: Psychiatry

## 2013-05-21 ENCOUNTER — Other Ambulatory Visit: Payer: Self-pay

## 2013-05-21 ENCOUNTER — Other Ambulatory Visit (HOSPITAL_COMMUNITY): Payer: Self-pay | Admitting: Psychiatry

## 2013-05-21 NOTE — Telephone Encounter (Signed)
Patient called. He was informed about his appointment tomorrow.

## 2013-05-22 ENCOUNTER — Encounter (HOSPITAL_COMMUNITY): Payer: Self-pay | Admitting: Psychiatry

## 2013-05-22 ENCOUNTER — Ambulatory Visit (INDEPENDENT_AMBULATORY_CARE_PROVIDER_SITE_OTHER): Payer: 59 | Admitting: Psychiatry

## 2013-05-22 VITALS — BP 110/70 | HR 68 | Ht 69.0 in | Wt 199.5 lb

## 2013-05-22 DIAGNOSIS — F332 Major depressive disorder, recurrent severe without psychotic features: Secondary | ICD-10-CM

## 2013-05-22 MED ORDER — CLONAZEPAM 1 MG PO TABS: 1.0000 mg | ORAL_TABLET | Freq: Two times a day (BID) | ORAL | Status: DC | PRN

## 2013-05-22 MED ORDER — SERTRALINE HCL 100 MG PO TABS: 200.0000 mg | ORAL_TABLET | Freq: Every day | ORAL | Status: DC
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
## 2013-05-23 LAB — DRUGS OF ABUSE SCREEN W/O ALC, ROUTINE URINE
Amphetamine Screen, Ur: NEGATIVE
Benzodiazepines.: POSITIVE — AB
Marijuana Metabolite: NEGATIVE
Methadone: NEGATIVE
Phencyclidine (PCP): NEGATIVE

## 2013-05-25 ENCOUNTER — Encounter (HOSPITAL_BASED_OUTPATIENT_CLINIC_OR_DEPARTMENT_OTHER): Payer: Self-pay

## 2013-05-25 ENCOUNTER — Telehealth: Payer: Self-pay | Admitting: *Deleted

## 2013-05-25 NOTE — Telephone Encounter (Signed)
Returning Vernon's call had to lvm.Loralee Pacas [place]

## 2013-05-26 ENCOUNTER — Ambulatory Visit (HOSPITAL_BASED_OUTPATIENT_CLINIC_OR_DEPARTMENT_OTHER): Payer: 59

## 2013-05-29 LAB — BENZODIAZEPINES (GC/LC/MS), URINE
Alprazolam metabolite (GC/LC/MS), ur confirm: NEGATIVE ng/mL
Clonazepam metabolite (GC/LC/MS), ur confirm: 439 ng/mL
Diazepam (GC/LC/MS), ur confirm: NEGATIVE ng/mL
Flurazepam metabolite (GC/LC/MS), ur confirm: NEGATIVE ng/mL
Lorazepam (GC/LC/MS), ur confirm: NEGATIVE ng/mL
Midazolam (GC/LC/MS), ur confirm: NEGATIVE ng/mL
Nordiazepam (GC/LC/MS), ur confirm: NEGATIVE ng/mL
Oxazepam (GC/LC/MS), ur confirm: NEGATIVE ng/mL
Triazolam metabolite (GC/LC/MS), ur confirm: NEGATIVE ng/mL

## 2013-06-03 LAB — LIPID PANEL
HDL: 33 mg/dL — ABNORMAL LOW (ref >39)
LDL Cholesterol: 34 mg/dL (ref 0–99)

## 2013-06-03 LAB — COMPLETE METABOLIC PANEL WITH GFR
ALT: 20 U/L (ref 0–53)
AST: 21 U/L (ref 0–37)
Albumin: 4.2 g/dL (ref 3.5–5.2)
Alkaline Phosphatase: 48 U/L (ref 39–117)
BUN: 13 mg/dL (ref 6–23)
Chloride: 106 mEq/L (ref 96–112)
Potassium: 4.4 mEq/L (ref 3.5–5.3)

## 2013-06-25 ENCOUNTER — Other Ambulatory Visit: Payer: Self-pay | Admitting: Family Medicine

## 2013-06-26 ENCOUNTER — Encounter: Payer: Self-pay | Admitting: Family Medicine

## 2013-06-26 DIAGNOSIS — Z9989 Dependence on other enabling machines and devices: Secondary | ICD-10-CM | POA: Insufficient documentation

## 2013-06-26 DIAGNOSIS — G4733 Obstructive sleep apnea (adult) (pediatric): Secondary | ICD-10-CM | POA: Insufficient documentation

## 2013-07-03 ENCOUNTER — Ambulatory Visit (HOSPITAL_COMMUNITY): Payer: Self-pay | Admitting: Psychiatry

## 2013-07-05 ENCOUNTER — Other Ambulatory Visit: Payer: Self-pay | Admitting: Family Medicine

## 2013-07-17 ENCOUNTER — Encounter: Payer: Self-pay | Admitting: Family Medicine

## 2013-07-21 ENCOUNTER — Other Ambulatory Visit (HOSPITAL_COMMUNITY): Payer: Self-pay | Admitting: Psychiatry

## 2013-07-24 ENCOUNTER — Encounter (HOSPITAL_COMMUNITY): Payer: Self-pay | Admitting: Psychiatry

## 2013-07-24 ENCOUNTER — Other Ambulatory Visit (HOSPITAL_COMMUNITY): Payer: Self-pay | Admitting: Psychiatry

## 2013-07-24 ENCOUNTER — Ambulatory Visit (INDEPENDENT_AMBULATORY_CARE_PROVIDER_SITE_OTHER): Payer: 59 | Admitting: Psychiatry

## 2013-07-24 VITALS — BP 119/65 | HR 75 | Ht 69.0 in | Wt 198.0 lb

## 2013-07-24 DIAGNOSIS — F411 Generalized anxiety disorder: Secondary | ICD-10-CM

## 2013-07-24 DIAGNOSIS — F332 Major depressive disorder, recurrent severe without psychotic features: Secondary | ICD-10-CM

## 2013-07-24 MED ORDER — SERTRALINE HCL 100 MG PO TABS: 200.0000 mg | ORAL_TABLET | Freq: Every day | ORAL | Status: DC

## 2013-07-24 MED ORDER — CLONAZEPAM 1 MG PO TABS: 1.0000 mg | ORAL_TABLET | Freq: Two times a day (BID) | ORAL | Status: DC | PRN
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
## 2013-08-12 ENCOUNTER — Encounter: Payer: Self-pay | Admitting: Physician Assistant

## 2013-08-12 ENCOUNTER — Ambulatory Visit (INDEPENDENT_AMBULATORY_CARE_PROVIDER_SITE_OTHER): Payer: 59 | Admitting: Physician Assistant

## 2013-08-12 ENCOUNTER — Ambulatory Visit: Payer: Self-pay | Admitting: Family Medicine

## 2013-08-12 VITALS — BP 122/71 | HR 68 | Wt 200.0 lb

## 2013-08-12 DIAGNOSIS — M7520 Bicipital tendinitis, unspecified shoulder: Secondary | ICD-10-CM

## 2013-08-12 DIAGNOSIS — M25519 Pain in unspecified shoulder: Secondary | ICD-10-CM

## 2013-08-12 DIAGNOSIS — Z23 Encounter for immunization: Secondary | ICD-10-CM

## 2013-08-12 DIAGNOSIS — M7521 Bicipital tendinitis, right shoulder: Secondary | ICD-10-CM

## 2013-08-12 DIAGNOSIS — M25511 Pain in right shoulder: Secondary | ICD-10-CM

## 2013-08-12 MED ORDER — IBUPROFEN 800 MG PO TABS: 800.0000 mg | ORAL_TABLET | Freq: Three times a day (TID) | ORAL | Status: DC | PRN

## 2013-08-12 NOTE — Patient Instructions (Addendum)
Ibuprofen 800mg  up to three times a day.  Stretches given in handout. Ice shoulder and rest for next 2 weeks.  If not improving follow up.

## 2013-08-12 NOTE — Progress Notes (Signed)
  Subjective:    Patient ID: Gabriel Gilbert, male    DOB: 1951/05/27, 62 y.o.   MRN: [id number]  HPI Patient is a 62-year-old male who presents to the clinic with right shoulder pain for the last 3 weeks. Pain is more noticeable when he tries to reach behind him with movement. At rest He is relatively pain free with an occasional shooting pain. He denies any trauma to his right shoulder. He denies any heavy lifting. He has not had any loss of strength at this point. He does not use arm for lifting often. Patient has not done anything to help make better and only movement seems to make worse. He has not had any radiating pain down right arm. He has never had anything like this before.   Review of Systems     Objective:   Physical Exam  Constitutional: He appears well-developed and well-nourished.  Musculoskeletal:  Right shoulder: No tenderness over acromion or clavice. Pt able to internally rotate as well as abduct and adduct. Lateral rotation was painful as well as resistence to flexion at the elbow. Hawkins sign was painful and positive. Empty can negative. Strength 5/5. Hand grip normal.           Assessment & Plan:  Bicep tendinitis/right shoulder pain-will get x-ray of right shoulder today. Gave handout on tendinitis. Discussed regular ibuprofen use as well as icing and range of motion exercises. If not improving in the next 2 weeks we could consider pain injection in the shoulder. Could certainly be a partial tear of rotator cuff but since strength intake conservate treatment should also improve/heal tear.  Call if not improving.  Patient received flu shot without complication.

## 2013-08-17 ENCOUNTER — Other Ambulatory Visit: Payer: Self-pay | Admitting: Family Medicine

## 2013-08-18 ENCOUNTER — Ambulatory Visit: Payer: Self-pay | Admitting: Family Medicine

## 2013-08-18 DIAGNOSIS — Z0289 Encounter for other administrative examinations: Secondary | ICD-10-CM

## 2013-08-24 ENCOUNTER — Ambulatory Visit (INDEPENDENT_AMBULATORY_CARE_PROVIDER_SITE_OTHER): Payer: 59 | Admitting: Psychiatry

## 2013-08-24 ENCOUNTER — Encounter (HOSPITAL_COMMUNITY): Payer: Self-pay | Admitting: Psychiatry

## 2013-08-24 ENCOUNTER — Encounter (INDEPENDENT_AMBULATORY_CARE_PROVIDER_SITE_OTHER): Payer: Self-pay

## 2013-08-24 VITALS — BP 107/64 | HR 69 | Ht 69.0 in | Wt 198.0 lb

## 2013-08-24 DIAGNOSIS — F332 Major depressive disorder, recurrent severe without psychotic features: Secondary | ICD-10-CM

## 2013-08-24 DIAGNOSIS — F411 Generalized anxiety disorder: Secondary | ICD-10-CM

## 2013-08-24 MED ORDER — SERTRALINE HCL 100 MG PO TABS: ORAL_TABLET | ORAL | Status: DC

## 2013-08-24 MED ORDER — CLONAZEPAM 0.5 MG PO TABS: 1.0000 mg | ORAL_TABLET | Freq: Three times a day (TID) | ORAL | Status: DC | PRN
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
## 2013-08-28 ENCOUNTER — Telehealth (HOSPITAL_COMMUNITY): Payer: Self-pay

## 2013-08-28 DIAGNOSIS — F332 Major depressive disorder, recurrent severe without psychotic features: Secondary | ICD-10-CM

## 2013-08-28 NOTE — Telephone Encounter (Signed)
Patient has filled prescription on 08/27/2013 for 1 mg BID #60, and will see this provider prior to his next appointment 09/24/2013, so will not rewrite this prescription at this time. Called patient and informed him of the same and to take lorazepam 1 mg- 1/2 tablet TID.

## 2013-09-02 ENCOUNTER — Telehealth: Payer: Self-pay | Admitting: Family Medicine

## 2013-09-02 NOTE — Telephone Encounter (Signed)
Please call patient and remind him that he is due for followup on diabetes.

## 2013-09-02 NOTE — Telephone Encounter (Signed)
LM for pt he is due for his diabetes f/u.  Meyer Cory, LPN

## 2013-09-24 ENCOUNTER — Ambulatory Visit (HOSPITAL_COMMUNITY): Payer: 59 | Admitting: Psychiatry

## 2013-09-29 ENCOUNTER — Other Ambulatory Visit (HOSPITAL_COMMUNITY): Payer: Self-pay | Admitting: Psychiatry

## 2013-09-29 NOTE — Telephone Encounter (Signed)
Called patient. Advised him that his prescription is available at the pharmacy.

## 2013-10-26 ENCOUNTER — Ambulatory Visit (INDEPENDENT_AMBULATORY_CARE_PROVIDER_SITE_OTHER): Payer: 59 | Admitting: Family Medicine

## 2013-10-26 ENCOUNTER — Encounter: Payer: Self-pay | Admitting: Family Medicine

## 2013-10-26 VITALS — BP 118/61 | HR 74 | Temp 98.0°F | Ht 69.0 in | Wt 198.0 lb

## 2013-10-26 DIAGNOSIS — F329 Major depressive disorder, single episode, unspecified: Secondary | ICD-10-CM

## 2013-10-26 DIAGNOSIS — I10 Essential (primary) hypertension: Secondary | ICD-10-CM

## 2013-10-26 DIAGNOSIS — G4733 Obstructive sleep apnea (adult) (pediatric): Secondary | ICD-10-CM

## 2013-10-26 DIAGNOSIS — F32A Depression, unspecified: Secondary | ICD-10-CM

## 2013-10-26 DIAGNOSIS — E119 Type 2 diabetes mellitus without complications: Secondary | ICD-10-CM

## 2013-10-26 LAB — POCT UA - MICROALBUMIN
Albumin/Creatinine Ratio, Urine, POC: <30
Creatinine, POC: 100 mg/dL
Microalbumin Ur, POC: 10 mg/L

## 2013-10-26 LAB — POCT GLYCOSYLATED HEMOGLOBIN (HGB A1C): Hemoglobin A1C: 6.6

## 2013-10-26 MED ORDER — VORTIOXETINE HBR 10 MG PO TABS: 10.0000 mg | ORAL_TABLET | Freq: Every day | ORAL | Status: DC

## 2013-10-26 NOTE — Progress Notes (Signed)
   Subjective:    Patient ID: Gabriel Gilbert, male    DOB: February 12, 1951, 62 y.o.   MRN: [id number]  HPI DM- No hypoglycemic events, no wounds that aren't healing well.  Taking medications regularly.   HTN -  Pt denies chest pain, SOB, dizziness, or heart palpitations.  Taking meds as directed w/o problems.  Denies medication side effects.   Depression - was on zoloft but weaned off about a month ago. Has been on multiple meds.  Has a new psych. He is waiting to get records before making any changes.   OSA - Has been trying to wear his CPAP but have problems with the attachment.  Review of Systems    .basic  Objective:   Physical Exam  Constitutional: He is oriented to person, place, and time. He appears well-developed and well-nourished.  HENT:  Head: Normocephalic and atraumatic.  Cardiovascular: Normal rate, regular rhythm and normal heart sounds.   Pulmonary/Chest: Effort normal and breath sounds normal.  Neurological: He is alert and oriented to person, place, and time.  Skin: Skin is warm and dry.  Psychiatric: He has a normal mood and affect. His behavior is normal.          Assessment & Plan:  DM-  Well controlled.  Continue current regimen.  F/U in 3-4 months.  Reminded to get eye exam  HTN - well controlled. F/U in 6 months.   Depression - Will start brillenta. Has f/u with psych next month   OSA - encouraged her to continue his CPAP. He is contacting the company to get a new mask. Has f/u with pulm scheduled.

## 2013-10-29 ENCOUNTER — Encounter: Payer: Self-pay | Admitting: Family Medicine

## 2013-12-09 ENCOUNTER — Other Ambulatory Visit: Payer: Self-pay | Admitting: Family Medicine

## 2013-12-25 ENCOUNTER — Ambulatory Visit (INDEPENDENT_AMBULATORY_CARE_PROVIDER_SITE_OTHER): Payer: 59 | Admitting: Family Medicine

## 2013-12-28 ENCOUNTER — Ambulatory Visit (INDEPENDENT_AMBULATORY_CARE_PROVIDER_SITE_OTHER): Payer: 59 | Admitting: Physician Assistant

## 2013-12-28 ENCOUNTER — Other Ambulatory Visit: Payer: Self-pay | Admitting: *Deleted

## 2013-12-28 ENCOUNTER — Encounter: Payer: Self-pay | Admitting: Physician Assistant

## 2013-12-28 VITALS — BP 130/77 | HR 70 | Wt 202.0 lb

## 2013-12-28 DIAGNOSIS — R110 Nausea: Secondary | ICD-10-CM

## 2013-12-28 DIAGNOSIS — F332 Major depressive disorder, recurrent severe without psychotic features: Secondary | ICD-10-CM

## 2013-12-28 DIAGNOSIS — F17200 Nicotine dependence, unspecified, uncomplicated: Secondary | ICD-10-CM

## 2013-12-28 DIAGNOSIS — F411 Generalized anxiety disorder: Secondary | ICD-10-CM

## 2013-12-28 MED ORDER — CLONAZEPAM 0.5 MG PO TABS: 0.5000 mg | ORAL_TABLET | Freq: Two times a day (BID) | ORAL | Status: DC | PRN

## 2013-12-28 NOTE — Progress Notes (Signed)
   Subjective:    Patient ID: Gabriel Gilbert, male    DOB: 08-21-1951, 63 y.o.   MRN: [id number]  HPI Pt is a 63 yo male who presents to the clinic with 1 and 1/2 weeks of nausea that started after not having his klonopin. He ran out and psychiatrist has not been in office. Per pt he called him this morning and stated he would get a refill if he could get into the office. Now pt is concerned about the bad weather that it will be even longer to get. He takes .5mg  twice a day. Only been on wellbutrin for a few days. If he keeps moving the nausea goes away but if he sits still nausea comes back. Tried ginger ale with no relief. Denies any abdominal pain, fever, chills, HA's, bowel changes. Pt is under a lot of stress it looks like he is going to lose his house. He doesn't work. He is very depressed and anxious. He is not currently talking to anyone about depression etc. His psychiatrist is making changes to medications to help him.    Review of Systems     Objective:   Physical Exam  Constitutional: He is oriented to person, place, and time. He appears well-developed and well-nourished.  HENT:  Head: Normocephalic and atraumatic.  Cardiovascular: Normal rate and normal heart sounds.   Pulmonary/Chest: Effort normal and breath sounds normal.  Neurological: He is alert and oriented to person, place, and time.  Skin: Skin is dry.  Psychiatric:  Flat affect.          Assessment & Plan:  GAD/Depression/Nausea- GAD-7 was 19. discussed with pt nausea could be from anxiety if used to klonapin. I discussed that I did not want to start taking over klonapin and he needed to be up front with psychiatrist that I am giving him a few tabs until could get refilled by him. Gave klonapin #20 today. I do think he would benefit from counseling. I gave him a card to contact someone. I discussed needs to tell psych if medications are not helping.   Spent 25 minutes with patient and greater than 50 percent of  visit spent counseling pt regarding benzo and why they have to be monitor and side effects.

## 2014-01-25 ENCOUNTER — Ambulatory Visit (INDEPENDENT_AMBULATORY_CARE_PROVIDER_SITE_OTHER): Payer: 59 | Admitting: Family Medicine

## 2014-01-25 ENCOUNTER — Encounter: Payer: Self-pay | Admitting: Family Medicine

## 2014-01-25 VITALS — BP 112/66 | HR 77 | Wt 197.0 lb

## 2014-01-25 DIAGNOSIS — E119 Type 2 diabetes mellitus without complications: Secondary | ICD-10-CM

## 2014-01-25 DIAGNOSIS — I10 Essential (primary) hypertension: Secondary | ICD-10-CM

## 2014-01-25 LAB — POCT GLYCOSYLATED HEMOGLOBIN (HGB A1C): HEMOGLOBIN A1C: 7.6

## 2014-01-25 MED ORDER — EMPAGLIFLOZIN 10 MG PO TABS: 1.0000 | ORAL_TABLET | Freq: Every day | ORAL | Status: DC

## 2014-01-25 NOTE — Patient Instructions (Signed)
We will add jardiance to your diabetic regimen.  Activate you coupon card first and then take the card to the pharmacy.  Continue to work on exercise and diet and weight loss. Remember to schedule your eye exam.

## 2014-01-25 NOTE — Progress Notes (Signed)
   Subjective:    Patient ID: Gabriel Gilbert, male    DOB: Jan 29, 1951, 63 y.o.   MRN: [id number]  HPI Diabetes - no hypoglycemic events. No wounds or sores that are not healing well. No increased thirst or urination. Not checking glucose at home. Taking medications as prescribed without any side effects. On 28 units of Lantus.  Also on metformin.    Hypertension- Pt denies chest pain, SOB, dizziness, or heart palpitations.  Taking meds as directed w/o problems.  Denies medication side effects.    Review of Systems     Objective:   Physical Exam  Constitutional: He is oriented to person, place, and time. He appears well-developed and well-nourished.  HENT:  Head: Normocephalic and atraumatic.  Cardiovascular: Normal rate, regular rhythm and normal heart sounds.   Pulmonary/Chest: Effort normal and breath sounds normal.  Neurological: He is alert and oriented to person, place, and time.  Skin: Skin is warm and dry.  Psychiatric: He has a normal mood and affect. His behavior is normal.          Assessment & Plan:  Diabetes- hemoglobin A1c is 7.6 today.  Uncontrolled. Discussed adding another medication for control.  F/U in 3 months.  Will start jardiance 10mg  daily.  Coupon card given. Discussed potential side effects of the medication. Call if any side effects or problems. Otherwise we'll see him back in 3 months. Reminded him again to make sure that he gets his eye exam this year.  HTN - well controlled. Continue current regimen.

## 2014-02-22 ENCOUNTER — Other Ambulatory Visit: Payer: Self-pay | Admitting: Family Medicine

## 2014-03-10 ENCOUNTER — Other Ambulatory Visit: Payer: Self-pay | Admitting: Family Medicine

## 2014-03-22 ENCOUNTER — Other Ambulatory Visit: Payer: Self-pay | Admitting: Family Medicine

## 2014-03-30 ENCOUNTER — Encounter: Payer: Self-pay | Admitting: Family Medicine

## 2014-03-30 ENCOUNTER — Ambulatory Visit (INDEPENDENT_AMBULATORY_CARE_PROVIDER_SITE_OTHER): Payer: 59 | Admitting: Family Medicine

## 2014-03-30 VITALS — BP 115/60 | HR 88 | Temp 98.0°F | Ht 69.0 in | Wt 194.0 lb

## 2014-03-30 DIAGNOSIS — F32A Depression, unspecified: Secondary | ICD-10-CM

## 2014-03-30 DIAGNOSIS — F17200 Nicotine dependence, unspecified, uncomplicated: Secondary | ICD-10-CM

## 2014-03-30 DIAGNOSIS — Z720 Tobacco use: Secondary | ICD-10-CM

## 2014-03-30 DIAGNOSIS — J209 Acute bronchitis, unspecified: Secondary | ICD-10-CM

## 2014-03-30 DIAGNOSIS — F3289 Other specified depressive episodes: Secondary | ICD-10-CM

## 2014-03-30 DIAGNOSIS — F329 Major depressive disorder, single episode, unspecified: Secondary | ICD-10-CM

## 2014-03-30 DIAGNOSIS — R0602 Shortness of breath: Secondary | ICD-10-CM

## 2014-03-30 MED ORDER — PREDNISONE 50 MG PO TABS: 50.0000 mg | ORAL_TABLET | Freq: Every day | ORAL | Status: DC

## 2014-03-30 MED ORDER — DOXYCYCLINE HYCLATE 100 MG PO TABS: 100.0000 mg | ORAL_TABLET | Freq: Two times a day (BID) | ORAL | Status: DC

## 2014-03-30 MED ORDER — ALBUTEROL SULFATE HFA 108 (90 BASE) MCG/ACT IN AERS: 2.0000 | INHALATION_SPRAY | RESPIRATORY_TRACT | Status: DC | PRN

## 2014-03-30 MED ORDER — ALBUTEROL SULFATE (2.5 MG/3ML) 0.083% IN NEBU
2.5000 mg | INHALATION_SOLUTION | Freq: Once | RESPIRATORY_TRACT | Status: AC
  Administered 2014-03-30: 2.5 mg via RESPIRATORY_TRACT

## 2014-03-30 MED ORDER — METHYLPREDNISOLONE SODIUM SUCC 125 MG IJ SOLR
125.0000 mg | Freq: Once | INTRAMUSCULAR | Status: AC
  Administered 2014-03-30: 125 mg via INTRAMUSCULAR

## 2014-03-30 NOTE — Patient Instructions (Addendum)
Followup in one month for spirometry. Use the albuterol inhaler every 4 hours. As you're feeling better can increase every 6 hours and then twice a day and eventually just once a day. Please just follow the instructions for the Viibryd starter pack.

## 2014-03-30 NOTE — Addendum Note (Signed)
Addended by: Deno EtienneBARKLEY, Jaielle Dlouhy L on: 03/30/2014 02:46 PM   Modules accepted: Orders

## 2014-03-30 NOTE — Progress Notes (Signed)
   Subjective:    Patient ID: Gabriel Gilbert, male    DOB: 06/10/1951, 63 y.o.   MRN: [id number]  HPI SOB x 3 days. Says worse with acitivyt.  Says he slept with the fan going, blowing against his face one night and it got corneas had symptoms ever since then. Dry cough x 3 days. No fever, chills or sweats. Smokes 2 ppd.  Hx of OSA.  Mild nasal congestion.  He denies any history of allergies. No ST or ear pain. Not using CPAP last couple of days. Never been on inhalers.   Depression-he still feeling very down. He's been on Effexor but decided not to refill it because he really felt like it wasn't doing anything. He was on 150 mg his psychiatrist wanted him to try 300 mg.  Review of Systems     Objective:   Physical Exam  Constitutional: He is oriented to person, place, and time. He appears well-developed and well-nourished.  HENT:  Head: Normocephalic and atraumatic.  Right Ear: External ear normal.  Left Ear: External ear normal.  Nose: Nose normal.  Mouth/Throat: Oropharynx is clear and moist.  TMs and canals are clear.   Eyes: Conjunctivae and EOM are normal. Pupils are equal, round, and reactive to light.  Some erythema and crusting in the inner corners of both eyes. The crest appears to be yellow. There some mild conjunctival inflammation and scleral injection.  Neck: Neck supple. No thyromegaly present.  Cardiovascular: Normal rate and normal heart sounds.   Pulmonary/Chest: Effort normal. He has wheezes.  Wheezing anterior chest fields bilat.   Lymphadenopathy:    He has no cervical adenopathy.  Neurological: He is alert and oriented to person, place, and time.  Skin: Skin is warm and dry.  Psychiatric: He has a normal mood and affect.          Assessment & Plan:  Shortness of breath-suspect could be related to underlying COPD. He is a very heavy smoker and his symptoms are more consistent with COPD type exacerbation. We'll treat with albuterol nebs and get IM  Solu-Medrol and 25 mg IM. Will tx with doxycycline.  F/U in 1 month for spirometry.   Depression-he is interested in starting on the new medications. Will give a sample starter pack of Viibryd.  He just feels lack of motivation and feels down on this daily. I think he still grieving from the loss of his mother. 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 This was very difficult and stressful for him.  Tobacco abuse-encourage cessation encouraged him to please think about cutting back. He says he well.

## 2014-04-27 ENCOUNTER — Ambulatory Visit: Payer: Self-pay | Admitting: Family Medicine

## 2014-04-30 ENCOUNTER — Encounter: Payer: Self-pay | Admitting: Family Medicine

## 2014-04-30 ENCOUNTER — Ambulatory Visit (INDEPENDENT_AMBULATORY_CARE_PROVIDER_SITE_OTHER): Payer: 59 | Admitting: Family Medicine

## 2014-04-30 VITALS — BP 105/56 | HR 71 | Wt 191.0 lb

## 2014-04-30 DIAGNOSIS — E119 Type 2 diabetes mellitus without complications: Secondary | ICD-10-CM

## 2014-04-30 DIAGNOSIS — F332 Major depressive disorder, recurrent severe without psychotic features: Secondary | ICD-10-CM

## 2014-04-30 DIAGNOSIS — F411 Generalized anxiety disorder: Secondary | ICD-10-CM

## 2014-04-30 DIAGNOSIS — I10 Essential (primary) hypertension: Secondary | ICD-10-CM

## 2014-04-30 MED ORDER — LISINOPRIL 10 MG PO TABS: 10.0000 mg | ORAL_TABLET | Freq: Every day | ORAL | Status: DC

## 2014-04-30 NOTE — Progress Notes (Signed)
   Subjective:    Patient ID: Gabriel Gilbert, male    DOB: 11-27-1950, 63 y.o.   MRN: [id number]  HPI Diabetes - no hypoglycemic events. No wounds or sores that are not healing well. No increased thirst or urination. Checking glucose at home. Taking medications as prescribed without any side effects.  Hypertension- Pt denies chest pain, SOB, dizziness, or heart palpitations.  Taking meds as directed w/o problems.  Denies medication side effects.    Lab Results  Component Value Date   CHOL 92 06/03/2013   HDL 33* 06/03/2013   LDLCALC 34 06/03/2013   TRIG 125 06/03/2013   CHOLHDL 2.8 06/03/2013   Generalized anxiety disorder-he did see a psychiatrist and he told him about the sample pack I have given him up Viibryd. Evidently he was upset about this and have recommended that Gabriel Gilbert get his medications from our office for the future. He asked if I had received a letter or a note from Dr. Rubye OaksPalmer. I have not.  Review of Systems     Objective:   Physical Exam  Constitutional: He is oriented to person, place, and time. He appears well-developed and well-nourished.  HENT:  Head: Normocephalic and atraumatic.  Cardiovascular: Normal rate, regular rhythm and normal heart sounds.   Pulmonary/Chest: Effort normal and breath sounds normal.  Neurological: He is alert and oriented to person, place, and time.  Skin: Skin is warm and dry.  Psychiatric: He has a normal mood and affect. His behavior is normal.          Assessment & Plan:  DM- improving,  A1C is down to 7.2 reminded due for eye exam.  BP well controlled. On ASA, ACE. Still on 28 unit of Lantus. F/u in 3 months.   HTN - well controlled. In fact BP is low today. Will stop the HCTZ.  Continue the lisinoprill.   Generalized anxiety disorder-at this point I explained to him that he really needs to be under a psychiatrist's care because of his complex and long-standing history of anxiety. I will try to contact Dr. Julio SicksPalmer's office to see if  he has been discharged. If not I encouraged him to continue to be seen. If he has been discharged we will need to find a new psychiatrist probably here in our building.

## 2014-05-07 ENCOUNTER — Other Ambulatory Visit: Payer: Self-pay | Admitting: *Deleted

## 2014-05-07 MED ORDER — CLONAZEPAM 0.5 MG PO TABS: 0.5000 mg | ORAL_TABLET | Freq: Two times a day (BID) | ORAL | Status: DC | PRN

## 2014-05-09 ENCOUNTER — Other Ambulatory Visit: Payer: Self-pay | Admitting: Family Medicine

## 2014-05-18 ENCOUNTER — Other Ambulatory Visit: Payer: Self-pay | Admitting: Family Medicine

## 2014-05-31 ENCOUNTER — Telehealth: Payer: Self-pay | Admitting: *Deleted

## 2014-05-31 NOTE — Telephone Encounter (Signed)
Dr. Rubye OaksPalmer would like to know if Dr. Linford ArnoldMetheney would give Rx for Cymbalta for 1 month until he is seen by Dr. Rubye OaksPalmer.Loralee PacasBarkley, Keshun Berrett BeldenLynetta

## 2014-05-31 NOTE — Telephone Encounter (Signed)
Tried calling pt back but got a recording stating that the number has been disconnected.

## 2014-05-31 NOTE — Telephone Encounter (Signed)
That is fine. We just need to get the dose et cetera.

## 2014-06-03 MED ORDER — DULOXETINE HCL 30 MG PO CPEP: 30.0000 mg | ORAL_CAPSULE | Freq: Every day | ORAL | Status: DC

## 2014-06-03 NOTE — Telephone Encounter (Signed)
Pt called back rx sent phone # updated.Loralee PacasBarkley, Matilda Fleig ProctorvilleLynetta

## 2014-06-19 ENCOUNTER — Other Ambulatory Visit: Payer: Self-pay | Admitting: Family Medicine

## 2014-08-02 ENCOUNTER — Ambulatory Visit: Payer: Self-pay | Admitting: Family Medicine

## 2014-08-02 DIAGNOSIS — Z0289 Encounter for other administrative examinations: Secondary | ICD-10-CM

## 2014-09-09 ENCOUNTER — Emergency Department
Admission: EM | Admit: 2014-09-09 | Discharge: 2014-09-09 | Disposition: A | Discharge status: Home / Self Care | Payer: 59 | Source: Home / Self Care | Attending: Emergency Medicine | Admitting: Emergency Medicine

## 2014-09-09 ENCOUNTER — Encounter: Payer: Self-pay | Admitting: Emergency Medicine

## 2014-09-09 DIAGNOSIS — K047 Periapical abscess without sinus: Secondary | ICD-10-CM

## 2014-09-09 MED ORDER — HYDROCODONE-ACETAMINOPHEN 5-325 MG PO TABS: 1.0000 | ORAL_TABLET | ORAL | Status: DC | PRN

## 2014-09-09 MED ORDER — AMOXICILLIN-POT CLAVULANATE 875-125 MG PO TABS: 1.0000 | ORAL_TABLET | Freq: Two times a day (BID) | ORAL | Status: DC

## 2014-09-09 NOTE — ED Notes (Signed)
Upper right tooth absess, pain since yesterday

## 2014-09-09 NOTE — ED Provider Notes (Signed)
CSN: [id number]     Arrival date & time 09/09/14  1014 History   First MD Initiated Contact with Patient 09/09/14 1018     Chief Complaint  Patient presents with  . Dental Pain    HPI He complains of right mid upper tooth abscess, progressively worsening severe sharp pain 1 day. No radiation. Tried ibuprofen without relief. Denies fever or chills, nausea or vomiting. He admits that he does not see a dentist for routine care and he is chronically had cavity/chipped tooth in the area of acute pain for many years. Denies chest pain or shortness of breath or focal neurologic symptoms. Denies abdominal pain or nausea or vomiting or diarrhea. Past Medical History  Diagnosis Date  . Diabetes mellitus     type 2  . Hypertension   . Hyperlipidemia   . Depression   . Bell's palsy 12-10   Past Surgical History  Procedure Laterality Date  . Kidney stone removed    . Pfts      normal  . Cataract extraction Left    Family History  Problem Relation Age of Onset  . COPD Mother   . Hypertension Mother   . Alzheimer's disease Mother     ?  . Diabetes Father   . COPD Father   . COPD Sister   . Cancer Sister     lung  . Drug abuse Son    History  Substance Use Topics  . Smoking status: Former Smoker -- 2.00 packs/day for 25 years    Quit date: 02/10/2013  . Smokeless tobacco: Not on file     Comment: using electronic cigarettes  . Alcohol Use: No    Review of Systems  All other systems reviewed and are negative.   Allergies  Review of patient's allergies indicates not on file.  Home Medications   Prior to Admission medications   Medication Sig Start Date End Date Taking? Authorizing Provider  albuterol (PROAIR HFA) 108 (90 BASE) MCG/ACT inhaler Inhale 2 puffs into the lungs every 4 (four) hours as needed for wheezing or shortness of breath. 03/30/14   Agapito Gamesatherine D Metheney, MD  amoxicillin-clavulanate (AUGMENTIN) 875-125 MG per tablet Take 1 tablet by mouth 2 (two) times  daily. Take with food. 09/09/14   Lajean Manesavid Massey, MD  aspirin 81 MG tablet Take 81 mg by mouth daily.      Historical Provider, MD  B-D UF III MINI PEN NEEDLES 31G X 5 MM MISC use as directed by prescriber 03/10/14   Agapito Gamesatherine D Metheney, MD  clonazePAM (KLONOPIN) 0.5 MG tablet Take 1 tablet (0.5 mg total) by mouth 2 (two) times daily as needed for anxiety. 05/07/14   Agapito Gamesatherine D Metheney, MD  DULoxetine (CYMBALTA) 30 MG capsule Take 1 capsule (30 mg total) by mouth daily. 06/03/14   Agapito Gamesatherine D Metheney, MD  Empagliflozin (JARDIANCE) 10 MG TABS Take 1 tablet by mouth daily. 01/25/14   Agapito Gamesatherine D Metheney, MD  glucose blood (ONE TOUCH TEST STRIPS) test strip Pt testing one time a day. 09/23/12   Agapito Gamesatherine D Metheney, MD  HYDROcodone-acetaminophen (NORCO/VICODIN) 5-325 MG per tablet Take 1-2 tablets by mouth every 4 (four) hours as needed for severe pain. Take with food. 09/09/14   Lajean Manesavid Massey, MD  Insulin Pen Needle (NOVOFINE) 32G X 6 MM MISC 1 Units by Does not apply route as directed. by Does not apply route as directed. 10/08/11   Agapito Gamesatherine D Metheney, MD  LANTUS SOLOSTAR 100 UNIT/ML Solostar Pen inject 28  units at bedtime as directed    Agapito Gamesatherine D Metheney, MD  lisinopril (PRINIVIL,ZESTRIL) 10 MG tablet Take 1 tablet (10 mg total) by mouth daily. 04/30/14   Agapito Gamesatherine D Metheney, MD  metFORMIN (GLUCOPHAGE) 1000 MG tablet take 1 tablet by mouth twice a day with food    Agapito Gamesatherine D Metheney, MD  ONE TOUCH ULTRA TEST test strip TEST as directed 09/22/12   Agapito Gamesatherine D Metheney, MD   BP 167/90  Pulse 74  Temp(Src) 97.9 F (36.6 C) (Oral)  Ht 5\' 9"  (1.753 m)  Wt 197 lb (89.359 kg)  BMI 29.08 kg/m2  SpO2 98% Physical Exam  Nursing note and vitals reviewed. Constitutional: He is oriented to person, place, and time. He appears well-developed and well-nourished. No distress.  Uncomfortable from dental pain. Alert  HENT:  Head: Normocephalic and atraumatic. Head is without raccoon's eyes.  Right Ear:  No drainage. No mastoid tenderness.  Left Ear: No drainage. No mastoid tenderness.  Nose: Nose normal.  Mouth/Throat: Mucous membranes are normal. Abnormal dentition (Overall, poor dental hygiene). No uvula swelling. No posterior oropharyngeal edema, posterior oropharyngeal erythema or tonsillar abscesses.    Eyes: Conjunctivae and EOM are normal. Pupils are equal, round, and reactive to light. No scleral icterus.  Neck: Normal range of motion.  Cardiovascular: Normal rate.   Pulmonary/Chest: Effort normal.  Abdominal: He exhibits no distension.  Musculoskeletal: Normal range of motion.  Neurological: He is alert and oriented to person, place, and time.  Skin: Skin is warm.  Psychiatric: He has a normal mood and affect.    ED Course  Procedures (including critical care time) Labs Review Labs Reviewed - No data to display  Imaging Review No results found. He declined any imaging or other testing today.  MDM   1. Dental abscess    Right maxillary incisor area. Treatment options discussed, as well as risks, benefits, alternatives. I advised stat shot of penicillin, he refused. Patient voiced understanding and agreement with the following plans: I urged him to see dentist within 2 days, unexplained risks of not doing so. Names of dental clinics given, written instructions. New Prescriptions   AMOXICILLIN-CLAVULANATE (AUGMENTIN) 875-125 MG PER TABLET    Take 1 tablet by mouth 2 (two) times daily. Take with food.   HYDROCODONE-ACETAMINOPHEN (NORCO/VICODIN) 5-325 MG PER TABLET    Take 1-2 tablets by mouth every 4 (four) hours as needed for severe pain. Take with food.   Precautions discussed. Red flags discussed.--Emergency room if any red flag Questions invited and answered. Patient voiced understanding and agreement.   Lajean Manesavid Massey, MD 09/09/14 [id number]

## 2014-09-30 ENCOUNTER — Telehealth: Payer: Self-pay | Admitting: Family Medicine

## 2014-09-30 NOTE — Telephone Encounter (Signed)
Please call patient: Please remind him that he needs to go for his blood work that we ordered in June. We will be happy to report the lab slip order for him if he would like. Also make sure that he has gotten an up-to-date eye exam.

## 2014-10-01 NOTE — Telephone Encounter (Signed)
The phone number the patient gave is not his number. Mailed letter.

## 2014-10-18 ENCOUNTER — Telehealth: Payer: Self-pay

## 2014-10-18 NOTE — Telephone Encounter (Signed)
Patient states he is unable to afford an Eye exam.  Re: Dr Shelah LewandowskyMetheney's note from November. Please call patient: Please remind him that he needs to go for his blood work that we ordered in June. We will be happy to report the lab slip order for him if he would like. Also make sure that he has gotten an up-to-date eye exam.

## 2014-10-26 ENCOUNTER — Other Ambulatory Visit: Payer: Self-pay | Admitting: Family Medicine

## 2014-11-30 ENCOUNTER — Ambulatory Visit: Payer: Self-pay | Admitting: Family Medicine

## 2014-12-01 ENCOUNTER — Other Ambulatory Visit: Payer: Self-pay | Admitting: Family Medicine

## 2015-01-05 ENCOUNTER — Other Ambulatory Visit: Payer: Self-pay | Admitting: Family Medicine

## 2015-01-13 ENCOUNTER — Encounter: Payer: Self-pay | Admitting: Family Medicine

## 2015-01-13 ENCOUNTER — Ambulatory Visit (INDEPENDENT_AMBULATORY_CARE_PROVIDER_SITE_OTHER): Payer: 59 | Admitting: Family Medicine

## 2015-01-13 VITALS — BP 137/76 | HR 79 | Wt 206.0 lb

## 2015-01-13 DIAGNOSIS — F17200 Nicotine dependence, unspecified, uncomplicated: Secondary | ICD-10-CM

## 2015-01-13 DIAGNOSIS — Z720 Tobacco use: Secondary | ICD-10-CM

## 2015-01-13 DIAGNOSIS — E119 Type 2 diabetes mellitus without complications: Secondary | ICD-10-CM

## 2015-01-13 DIAGNOSIS — I10 Essential (primary) hypertension: Secondary | ICD-10-CM | POA: Diagnosis not present

## 2015-01-13 LAB — POCT UA - MICROALBUMIN
CREATININE, POC: 100 mg/dL
Microalbumin Ur, POC: 10 mg/L

## 2015-01-13 LAB — POCT GLYCOSYLATED HEMOGLOBIN (HGB A1C): Hemoglobin A1C: 7.3

## 2015-01-13 MED ORDER — EMPAGLIFLOZIN 10 MG PO TABS: 10.0000 mg | ORAL_TABLET | Freq: Every day | ORAL | Status: DC

## 2015-01-13 MED ORDER — INSULIN GLARGINE 100 UNIT/ML SOLOSTAR PEN: PEN_INJECTOR | SUBCUTANEOUS | Status: DC

## 2015-01-13 MED ORDER — METFORMIN HCL 1000 MG PO TABS: ORAL_TABLET | ORAL | Status: DC

## 2015-01-13 NOTE — Progress Notes (Signed)
   Subjective:    Patient ID: Gabriel Gilbert, male    DOB: Apr 08, 1951, 64 y.o.   MRN: [id number]  HPI   He reports he has not been internal and a while because he didn't have any income or insurance. He now is on disability. He's trying to get back on track and also reestablish with a psychiatrist.  Diabetes - no hypoglycemic events. No wounds or sores that are not healing well. No increased thirst or urination. Checking glucose at home. Taking medications as prescribed without any side effects.  Hypertension- Pt denies chest pain, SOB, dizziness, or heart palpitations.  Taking meds as directed w/o problems.  Denies medication side effects.    Tob abuse - smokes about 2 ppd.  No real desire to quit at this time.  Review of Systems     Objective:   Physical Exam  Constitutional: He is oriented to person, place, and time. He appears well-developed and well-nourished.  HENT:  Head: Normocephalic and atraumatic.  Cardiovascular: Normal rate, regular rhythm and normal heart sounds.   Pulmonary/Chest: Effort normal and breath sounds normal.  Neurological: He is alert and oriented to person, place, and time.  Skin: Skin is warm and dry.  Psychiatric: He has a normal mood and affect. His behavior is normal.          Assessment & Plan:  DM- uncontrolled by improved with A1C of 7.3 today. REminded he is due for eye exam.  Due for CMP and lipids.  Increase lantus to 32 units at bedtime. F/U in 3 months.  Foot exam performed today. Urine microalbumin performed today. Reminded him to schedule an eye exam. He says he will call himself. Declined flu vaccine today.  HTN - well controlled. Continue current regimen.   Tobacco abuse-did encourage him to consider cutting back. He was using e- cigarettes/year and was trying to wean but now is back up to 2 packs per day.

## 2015-01-19 ENCOUNTER — Telehealth: Payer: Self-pay

## 2015-01-19 NOTE — Telephone Encounter (Signed)
Opened in error

## 2015-01-25 ENCOUNTER — Ambulatory Visit (INDEPENDENT_AMBULATORY_CARE_PROVIDER_SITE_OTHER): Payer: 59 | Admitting: Family Medicine

## 2015-01-25 ENCOUNTER — Encounter: Payer: Self-pay | Admitting: Family Medicine

## 2015-01-25 VITALS — BP 120/58 | HR 82 | Wt 200.0 lb

## 2015-01-25 DIAGNOSIS — N529 Male erectile dysfunction, unspecified: Secondary | ICD-10-CM

## 2015-01-25 DIAGNOSIS — R6882 Decreased libido: Secondary | ICD-10-CM

## 2015-01-25 MED ORDER — VARDENAFIL HCL 10 MG PO TABS: 10.0000 mg | ORAL_TABLET | Freq: Every day | ORAL | Status: DC | PRN

## 2015-01-25 NOTE — Progress Notes (Signed)
   Subjective:    Patient ID: Gabriel Gilbert, male    DOB: [birth year], 64 y.o.   MRN: [id number]  HPI Here to discuss ED. Sxs x 1 year.  Hx of DM.  He is also on psychiatric medications as well. 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 She was also going through some personal issues so even though he had concerns he did not think that it was any underlying issue. He felt like it was just related to increased anxiety and stress levels. Now he has been more active with his girlfriend and feels like he is only able to achieve an erection about half the time. Completing intercourse is very difficult. 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 He does report some lack of energy as well as decreased strength and endurance as well as depression.         Review of Systems     Objective:   Physical Exam  Constitutional: He is oriented to person, place, and time. He appears well-developed and well-nourished.  HENT:  Head: Normocephalic and atraumatic.  Neurological: He is alert and oriented to person, place, and time.  Skin: Skin is warm and dry.  Psychiatric: He has a normal mood and affect. His behavior is normal.          Assessment & Plan:  ED - discussed that this can come from many causes including his diabetes and his psychiatric medications as well as blood pressure medicines etc. He did screen positive for possible low testosterone still we will check blood levels as well as check a CBC. If the blood work is normal then recommend a trial of ED medications. Discussed that these need to be taken with a light meal and that heavy meals can affect absorption. She expresses any chest pain vision changes etc. he is to stop it immediately. He has no prior history of cardiac problems. He is not on a nitrate.

## 2015-01-27 ENCOUNTER — Other Ambulatory Visit: Payer: Self-pay | Admitting: *Deleted

## 2015-01-27 DIAGNOSIS — R7989 Other specified abnormal findings of blood chemistry: Secondary | ICD-10-CM

## 2015-01-27 LAB — CBC WITH DIFFERENTIAL/PLATELET
Basophils Absolute: 0 10*3/uL (ref 0.0–0.1)
Basophils Relative: 0 % (ref 0–1)
Eosinophils Absolute: 0.2 10*3/uL (ref 0.0–0.7)
Eosinophils Relative: 2 % (ref 0–5)
HEMATOCRIT: 51.1 % (ref 39.0–52.0)
Hemoglobin: 17.1 g/dL — ABNORMAL HIGH (ref 13.0–17.0)
LYMPHS PCT: 35 % (ref 12–46)
Lymphs Abs: 2.9 10*3/uL (ref 0.7–4.0)
MCH: 30.2 pg (ref 26.0–34.0)
MCHC: 33.5 g/dL (ref 30.0–36.0)
MCV: 90.1 fL (ref 78.0–100.0)
MONO ABS: 0.6 10*3/uL (ref 0.1–1.0)
MPV: 10.9 fL (ref 8.6–12.4)
Monocytes Relative: 7 % (ref 3–12)
Neutro Abs: 4.7 10*3/uL (ref 1.7–7.7)
Neutrophils Relative %: 56 % (ref 43–77)
PLATELETS: 232 10*3/uL (ref 150–400)
RBC: 5.67 MIL/uL (ref 4.22–5.81)
RDW: 15 % (ref 11.5–15.5)
WBC: 8.4 10*3/uL (ref 4.0–10.5)

## 2015-01-27 LAB — TESTOSTERONE: Testosterone: 197 ng/dL — ABNORMAL LOW (ref 300–890)

## 2015-01-27 LAB — COMPLETE METABOLIC PANEL WITH GFR
ALBUMIN: 4 g/dL (ref 3.5–5.2)
ALT: 17 U/L (ref 0–53)
AST: 21 U/L (ref 0–37)
Alkaline Phosphatase: 70 U/L (ref 39–117)
BUN: 15 mg/dL (ref 6–23)
CO2: 22 meq/L (ref 19–32)
CREATININE: 0.79 mg/dL (ref 0.50–1.35)
Calcium: 9.1 mg/dL (ref 8.4–10.5)
Chloride: 105 mEq/L (ref 96–112)
Glucose, Bld: 130 mg/dL — ABNORMAL HIGH (ref 70–99)
Potassium: 4.6 mEq/L (ref 3.5–5.3)
SODIUM: 137 meq/L (ref 135–145)
Total Bilirubin: 0.5 mg/dL (ref 0.2–1.2)
Total Protein: 6.6 g/dL (ref 6.0–8.3)

## 2015-01-27 LAB — LIPID PANEL
Cholesterol: 150 mg/dL (ref 0–200)
HDL: 29 mg/dL — ABNORMAL LOW (ref >=40)
LDL Cholesterol: 81 mg/dL (ref 0–99)
Total CHOL/HDL Ratio: 5.2 Ratio
Triglycerides: 198 mg/dL — ABNORMAL HIGH (ref <150)
VLDL: 40 mg/dL (ref 0–40)

## 2015-01-27 LAB — TSH: TSH: 1.58 u[IU]/mL (ref 0.350–4.500)

## 2015-01-28 ENCOUNTER — Telehealth: Payer: Self-pay | Admitting: *Deleted

## 2015-01-28 MED ORDER — ATORVASTATIN CALCIUM 40 MG PO TABS: 40.0000 mg | ORAL_TABLET | Freq: Every day | ORAL | Status: DC

## 2015-01-28 NOTE — Telephone Encounter (Signed)
Called patient  Left VM that script has been sent

## 2015-01-28 NOTE — Addendum Note (Signed)
Addended by: Nani GasserMETHENEY, Altan Kraai D on: 01/28/2015 08:01 AM   Modules accepted: Orders

## 2015-01-28 NOTE — Telephone Encounter (Signed)
-----   Message from Agapito Gamesatherine D Metheney, MD sent at 01/28/2015  8:01 AM EDT ----- Script sent.

## 2015-03-28 ENCOUNTER — Ambulatory Visit: Payer: Self-pay | Admitting: Sports Medicine

## 2015-04-15 ENCOUNTER — Encounter: Payer: Self-pay | Admitting: Family Medicine

## 2015-04-15 ENCOUNTER — Ambulatory Visit (INDEPENDENT_AMBULATORY_CARE_PROVIDER_SITE_OTHER): Payer: 59 | Admitting: Family Medicine

## 2015-04-15 VITALS — BP 118/69 | HR 80 | Ht 69.0 in | Wt 201.0 lb

## 2015-04-15 DIAGNOSIS — E119 Type 2 diabetes mellitus without complications: Secondary | ICD-10-CM

## 2015-04-15 DIAGNOSIS — Z1159 Encounter for screening for other viral diseases: Secondary | ICD-10-CM

## 2015-04-15 DIAGNOSIS — I10 Essential (primary) hypertension: Secondary | ICD-10-CM | POA: Diagnosis not present

## 2015-04-15 DIAGNOSIS — Z114 Encounter for screening for human immunodeficiency virus [HIV]: Secondary | ICD-10-CM

## 2015-04-15 LAB — POCT GLYCOSYLATED HEMOGLOBIN (HGB A1C): Hemoglobin A1C: 7.5

## 2015-04-15 NOTE — Patient Instructions (Addendum)
Increase the lantus to 35 units.  Please remember to get your eye exam

## 2015-04-15 NOTE — Progress Notes (Signed)
   Subjective:    Patient ID: Gabriel Gilbert, male    DOB: December 17, 1950, 64 y.o.   MRN: [id number]  HPI Diabetes - no hypoglycemic events. No wounds or sores that are not healing well. No increased thirst or urination. Not checking glucose at home. Taking medications as prescribed without any side effects.  Hypertension- Pt denies chest pain, SOB, dizziness, or heart palpitations.  Taking meds as directed w/o problems.  Denies medication side effects.    Review of Systems     Objective:   Physical Exam  Constitutional: He is oriented to person, place, and time. He appears well-developed and well-nourished.  HENT:  Head: Normocephalic and atraumatic.  Cardiovascular: Normal rate, regular rhythm and normal heart sounds.   Pulmonary/Chest: Effort normal and breath sounds normal.  Neurological: He is alert and oriented to person, place, and time.  Skin: Skin is warm and dry.  Psychiatric: He has a normal mood and affect. His behavior is normal.          Assessment & Plan:  DM - Discussed diet and exercise. He drinks sweet tea almost every day from [employer]. Recently started drinking sweetened iced coffee. Discussed cutting these out or down significantly.  Increase the Lantus to 35 units.  Reminded to get eye exam. F/U in 3 months.    HTN- well controlled. Continue current regimen.   Due for Hep c and HIV screening based on current guidelines.

## 2015-04-28 ENCOUNTER — Ambulatory Visit (INDEPENDENT_AMBULATORY_CARE_PROVIDER_SITE_OTHER): Payer: 59 | Admitting: Sports Medicine

## 2015-04-28 ENCOUNTER — Ambulatory Visit (INDEPENDENT_AMBULATORY_CARE_PROVIDER_SITE_OTHER): Payer: 59

## 2015-04-28 ENCOUNTER — Encounter: Payer: Self-pay | Admitting: Sports Medicine

## 2015-04-28 VITALS — BP 132/73 | HR 76 | Ht 69.0 in | Wt 200.0 lb

## 2015-04-28 DIAGNOSIS — M5032 Other cervical disc degeneration, mid-cervical region: Secondary | ICD-10-CM | POA: Diagnosis not present

## 2015-04-28 DIAGNOSIS — M25561 Pain in right knee: Secondary | ICD-10-CM

## 2015-04-28 DIAGNOSIS — M5412 Radiculopathy, cervical region: Secondary | ICD-10-CM | POA: Insufficient documentation

## 2015-04-28 DIAGNOSIS — M2241 Chondromalacia patellae, right knee: Secondary | ICD-10-CM

## 2015-04-28 DIAGNOSIS — M25562 Pain in left knee: Secondary | ICD-10-CM

## 2015-04-28 MED ORDER — PREDNISONE 50 MG PO TABS: ORAL_TABLET | ORAL | Status: DC

## 2015-04-28 MED ORDER — MELOXICAM 15 MG PO TABS: ORAL_TABLET | ORAL | Status: DC

## 2015-04-28 NOTE — Assessment & Plan Note (Signed)
Physical therapy, meloxicam, prednisone, x-rays. Return in one month, MRI for interventional if no better. Patient does seem to desire to pursue a more conservative approach.

## 2015-04-28 NOTE — Progress Notes (Signed)
   Subjective:    I'm seeing this patient as a consultation for:  Dr. Nani Gasser  CC: Left shoulder pain  HPI: For several months this pleasant 64 year old male has been having increasing pain that he localizes in his left shoulder, predominantly described as paresthesias running down the back of the left arm. Moderate, persistent without radiation, no trauma. No bowel or bladder dysfunction, saddle numbness or lower extremity symptoms.  Right knee pain: Present for a few months, moderate, persistent without radiation. Localized under the kneecap.  Past medical history, Surgical history, Family history not pertinant except as noted below, Social history, Allergies, and medications have been entered into the medical record, reviewed, and no changes needed.   Review of Systems: No headache, visual changes, nausea, vomiting, diarrhea, constipation, dizziness, abdominal pain, skin rash, fevers, chills, night sweats, weight loss, swollen lymph nodes, body aches, joint swelling, muscle aches, chest pain, shortness of breath, mood changes, visual or auditory hallucinations.   Objective:   General: Well Developed, well nourished, and in no acute distress.  Neuro/Psych: Alert and oriented x3, extra-ocular muscles intact, able to move all 4 extremities, sensation grossly intact. Skin: Warm and dry, no rashes noted.  Respiratory: Not using accessory muscles, speaking in full sentences, trachea midline.  Cardiovascular: Pulses palpable, no extremity edema. Abdomen: Does not appear distended. Neck: Negative spurling's Full neck range of motion Grip strength and sensation normal in bilateral hands Strength good C4 to T1 distribution No sensory change to C4 to T1 Reflexes normal Right Knee: Normal to inspection with no erythema or effusion or obvious bony abnormalities. Tender to palpation under the medial patellar facet ROM normal in flexion and extension and lower leg rotation. Ligaments  with solid consistent endpoints including ACL, PCL, LCL, MCL. Negative Mcmurray's and provocative meniscal tests. Non painful patellar compression. Patellar and quadriceps tendons unremarkable. Hamstring and quadriceps strength is normal.  Impression and Recommendations:   This case required medical decision making of moderate complexity.

## 2015-04-28 NOTE — Assessment & Plan Note (Signed)
X-rays, formal physical therapy, meloxicam. 

## 2015-05-19 ENCOUNTER — Encounter: Payer: Self-pay | Admitting: Rehabilitative and Restorative Service Providers"

## 2015-05-19 ENCOUNTER — Ambulatory Visit (INDEPENDENT_AMBULATORY_CARE_PROVIDER_SITE_OTHER): Payer: 59 | Admitting: Rehabilitative and Restorative Service Providers"

## 2015-05-19 DIAGNOSIS — R531 Weakness: Secondary | ICD-10-CM

## 2015-05-19 DIAGNOSIS — M25562 Pain in left knee: Secondary | ICD-10-CM | POA: Diagnosis not present

## 2015-05-19 DIAGNOSIS — M6289 Other specified disorders of muscle: Secondary | ICD-10-CM | POA: Diagnosis not present

## 2015-05-19 DIAGNOSIS — M25561 Pain in right knee: Secondary | ICD-10-CM | POA: Diagnosis not present

## 2015-05-19 DIAGNOSIS — Z7409 Other reduced mobility: Secondary | ICD-10-CM

## 2015-05-19 DIAGNOSIS — R1012 Left upper quadrant pain: Secondary | ICD-10-CM | POA: Diagnosis not present

## 2015-05-19 DIAGNOSIS — R29898 Other symptoms and signs involving the musculoskeletal system: Secondary | ICD-10-CM

## 2015-05-19 NOTE — Therapy (Addendum)
[facility] [address] [place], [place], [address] Phone: [phone]   Fax:  [phone]  Physical Therapy Evaluation  Patient Details  Name: Gabriel Gilbert MRN: [id number] Date of Birth: October 13, 1951 Referring Provider:  Monica Becton,*  Encounter Date: 05/19/2015    Past Medical History  Diagnosis Date  . Diabetes mellitus     type 2  . Hypertension   . Hyperlipidemia   . Depression   . Bell's palsy 12-10    Past Surgical History  Procedure Laterality Date  . Kidney stone removed    . Pfts      normal  . Cataract extraction Left     There were no vitals filed for this visit.  Visit Diagnosis:  Pain in joint, lower leg, right - Plan: PT plan of care cert/re-cert  Pain in joint, lower leg, left - Plan: PT plan of care cert/re-cert  Weakness of both hips - Plan: PT plan of care cert/re-cert  Decreased strength, endurance, and mobility - Plan: PT plan of care cert/re-cert                   PT Short Term Goals - 05/19/15 1615    PT SHORT TERM GOAL #1   Title Patient I in initial HEP - 06-16-15   Time 4   Period Weeks   Status New   PT SHORT TERM GOAL #2   Title Improve posture and alignment - 06-16-15   Time 4   Period Weeks   Status New   PT SHORT TERM GOAL #3   Title Improve hamstring length to 75 degrees - 06-16-15   Time 4   Period Weeks   Status New           PT Long Term Goals - 05/19/15 1616    PT LONG TERM GOAL #1   Title Patient I in advanced HEP for d/c. - 07-14-15   Time 8   Period Weeks   Status New   PT LONG TERM GOAL #2   Title Increase strength bilat LE's to 4+/5 to 5/5 - 07-14-15   Time 8   Period Weeks   Status New   PT LONG TERM GOAL #3   Title IPatient to ascend and descend stairs with minimum to no pain 07-14-15   Time 8   Period Weeks   Status New   PT LONG TERM GOAL #4   Title Decrease FOTO to </= 32% limitation 07-14-15   Time 8    Period Weeks   Status New             Problem List Patient Active Problem List   Diagnosis Date Noted  . Left cervical radiculopathy 04/28/2015  . Chondromalacia of right patellofemoral joint 04/28/2015  . OSA on CPAP 06/26/2013  . Major depressive disorder, recurrent episode, severe, without mention of psychotic behavior 04/21/2013  . Generalized anxiety disorder 04/21/2013  . Obesity 02/14/2012  . RLS (restless legs syndrome) 03/12/2011  . DERMATITIS, HANDS 08/04/2010  . BELL'S PALSY 10/26/2009  . CORTICAL SENILE CATARACT 05/09/2009  . LUMBAR RADICULOPATHY 11/26/2008  . Diabetes mellitus without complication 03/13/2007  . INCONTINENCE, URGE 03/13/2007  . DEPRESSION, MAJOR, RECURRENT 08/20/2006  . TOBACCO DEPENDENCE 08/20/2006  . Essential hypertension, benign 08/20/2006  . KIDNEY STONE - NEPHROLITHIASIS 08/20/2006  . SLEEP APNEA 08/20/2006    Taylor Levick Rober Minion, PT, MPH 05/23/2015, 3:43 PM  [facility] [address]  [address] [place], [place], [address] Phone: [phone]   Fax:  [phone]

## 2015-05-19 NOTE — Patient Instructions (Signed)
Work on standing straight throughout the day!  Chest up!!  Sit straighter in your sofa when you are sitting!      Axial Extension (Chin Tuck)   Pull chin in and lengthen back of neck. Hold __10__ seconds  Repeat _10___ times. Do __several__ sessions per day.   Chin Tuck Hamstring Step 1   Straighten left knee, right knee bent. Pull leg up until you feel a stretch in the hamstrings. Hold _30__ seconds. Relax knee by returning foot to start. Repeat __3_ times. Each leg   Copyright  VHI. All rights reserved.   Thin pillow under head for comfort  Tuck chin SLIGHTLY toward chest, hold 10 sec 10 reps  Hip Flexor, Quadricep Stretch: Belly Down (Strap)   Use strap around foot. Lying on belly pull foot toward your buttocks.  Hold for _30 sec. Repeat __3_ times each leg.

## 2015-05-24 ENCOUNTER — Encounter: Payer: Self-pay | Admitting: Rehabilitative and Restorative Service Providers"

## 2015-05-24 ENCOUNTER — Ambulatory Visit (INDEPENDENT_AMBULATORY_CARE_PROVIDER_SITE_OTHER): Payer: 59 | Admitting: Rehabilitative and Restorative Service Providers"

## 2015-05-24 DIAGNOSIS — M25562 Pain in left knee: Secondary | ICD-10-CM

## 2015-05-24 DIAGNOSIS — Z7409 Other reduced mobility: Secondary | ICD-10-CM

## 2015-05-24 DIAGNOSIS — M25561 Pain in right knee: Secondary | ICD-10-CM

## 2015-05-24 DIAGNOSIS — R6889 Other general symptoms and signs: Secondary | ICD-10-CM

## 2015-05-24 DIAGNOSIS — R531 Weakness: Secondary | ICD-10-CM

## 2015-05-24 DIAGNOSIS — M6289 Other specified disorders of muscle: Secondary | ICD-10-CM | POA: Diagnosis not present

## 2015-05-24 DIAGNOSIS — R29898 Other symptoms and signs involving the musculoskeletal system: Secondary | ICD-10-CM

## 2015-05-24 NOTE — Therapy (Signed)
[facility] [address] [place], [place], [address] Phone: [phone]   Fax:  [phone]  Physical Therapy Treatment  Patient Details  Name: Gabriel Gilbert MRN: [id number] Date of Birth: [birth year] Referring Provider:  Monica Bectonhekkekandam, Thomas J,*  Encounter Date: 05/24/2015      PT End of Session - 05/24/15 1228    Visit Number 2   Number of Visits 16   Date for PT Re-Evaluation 07/14/15   PT Start Time 1153   PT Stop Time 1245   PT Time Calculation (min) 52 min   Activity Tolerance Patient tolerated treatment well      Past Medical History  Diagnosis Date  . Diabetes mellitus     type 2  . Hypertension   . Hyperlipidemia   . Depression   . Bell's palsy 12-10    Past Surgical History  Procedure Laterality Date  . Kidney stone removed    . Pfts      normal  . Cataract extraction Left     There were no vitals filed for this visit.  Visit Diagnosis:  Pain in joint, lower leg, right  Pain in joint, lower leg, left  Weakness of both hips  Decreased strength, endurance, and mobility      Subjective Assessment - 05/24/15 1154    Subjective Gabriel Gilbert reports that he does not have pain today. He did not do any of his exercises at home. worked on standing straighter "a little bit". He worked on sitting up straighter on the couch while watching TV.    Pertinent History Depression; AODM; Hypertension - denies any musculoskeletal injuries/prior problems   Limitations Standing;Walking   How long can you sit comfortably? 1-2 hours   How long can you stand comfortably? 10 min   How long can you walk comfortably? 10-20 min   Diagnostic tests xrays   Patient Stated Goals Walking up and down steps, going up and down ladder when he needs to    Currently in Pain? No/denies          [facility] Adult PT Treatment/Exercise - 05/24/15 0001    Neuro Re-ed    Neuro Re-ed Details  working on posture and alignment in standing    Neck Exercises: Machines for Strengthening   UBE (Upper Arm Bike) L1 - 1 min forward/1 min back x2   Neck Exercises: Theraband   Rows Limitations 19 reps 2 sets yellow   Shoulder External Rotation Limitations 10 reps 2 sets yellow TB   Other Theraband Exercises bilat scap retraction/shoulder ER 10 reps 2 sets yellow TB   Neck Exercises: Standing   Neck Retraction 10 reps;10 secs   Other Standing Exercises chest lift/postural work   Other Standing Exercises doorway stretch x3 positions; 20 sec hold 2 reps    Knee/Hip Exercises: Stretches   Passive Hamstring Stretch 3 reps;30 seconds  opposite knee bent   Quad Stretch 3 reps;30 seconds  prone with strap   Knee/Hip Exercises: Aerobic   Nustep L3 - 5 min    Knee/Hip Exercises: Standing   Forward Step Up Right;Left;2 sets;10 reps          PT Education - 05/24/15 1225    Education provided Yes   Education Details Stressed the [employer]ance of home exercise and working on his HEP; continued postural education; reviewed exercises; added exercises for home   Person(s) Educated Patient   Methods Explanation;Demonstration;Tactile cues;Verbal cues;Handout   Comprehension Verbalized understanding;Returned demonstration;Verbal cues required;Tactile cues required  PT Short Term Goals - 05/24/15 1232    PT SHORT TERM GOAL #1   Title Patient I in initial HEP - 06-16-15   Time 4   Period Weeks   Status On-going   PT SHORT TERM GOAL #2   Title Improve posture and alignment - 06-16-15   Period Weeks   Status On-going   PT SHORT TERM GOAL #3   Title Improve hamstring length to 75 degrees - 06-16-15   Time 4   Period Weeks   Status On-going           PT Long Term Goals - 05/24/15 1232    PT LONG TERM GOAL #1   Title Patient I in advanced HEP for d/c. - 07-14-15   Time 8   Period Weeks   Status On-going   PT LONG TERM GOAL #2   Title Increase strength bilat LE's to 4+/5 to 5/5 - 07-14-15   Time 8   Period Weeks    Status On-going   PT LONG TERM GOAL #3   Title IPatient to ascend and descend stairs with minimum to no pain 07-14-15   Time 8   Period Weeks   Status On-going   PT LONG TERM GOAL #4   Title Decrease FOTO to </= 32% limitation 07-14-15   Time 8   Period Weeks   Status On-going           Plan - 05/24/15 1229    Clinical Impression Statement Patient has not dne his exercises at home although he reports that he has worked on his posture and position for watching TV some. He preformed the exercises in the clinic well aand was cooporative for treatment. Encouraged patient to work on exercises at home.   Pt will benefit from skilled therapeutic intervention in order to improve on the following deficits Decreased strength;Decreased mobility;Improper body mechanics;Postural dysfunction;Pain;Decreased activity tolerance   Rehab Potential Good   PT Frequency 2x / week   PT Duration 8 weeks   PT Next Visit Plan Continue education re importance os exercise related to specific c/o's as well as general health; review exercises; progress with postural stretching(pec stretch) and strengthening; progress with LE strengthening .   PT Home Exercise Plan Work on posture in standing and walking; modify sitting for home; stretching   Consulted and Agree with Plan of Care Patient      Problem List Patient Active Problem List   Diagnosis Date Noted  . Left cervical radiculopathy 04/28/2015  . Chondromalacia of right patellofemoral joint 04/28/2015  . OSA on CPAP 06/26/2013  . Major depressive disorder, recurrent episode, severe, without mention of psychotic behavior 04/21/2013  . Generalized anxiety disorder 04/21/2013  . Obesity 02/14/2012  . RLS (restless legs syndrome) 03/12/2011  . DERMATITIS, HANDS 08/04/2010  . BELL'S PALSY 10/26/2009  . CORTICAL SENILE CATARACT 05/09/2009  . LUMBAR RADICULOPATHY 11/26/2008  . Diabetes mellitus without complication 03/13/2007  . INCONTINENCE, URGE  03/13/2007  . DEPRESSION, MAJOR, RECURRENT 08/20/2006  . TOBACCO DEPENDENCE 08/20/2006  . Essential hypertension, benign 08/20/2006  . KIDNEY STONE - NEPHROLITHIASIS 08/20/2006  . SLEEP APNEA 08/20/2006    Celyn Rober Minion, PT, MPH 05/24/2015, 12:34 PM  [facility] [address] [place], [place], [address] Phone: [phone]   Fax:  [phone]

## 2015-05-24 NOTE — Patient Instructions (Signed)
Scapula Adduction With Pectorals, Low   Stand in doorframe with palms against frame and arms at 45. Lean forward and squeeze shoulder blades. Hold __20 seconds. Repeat _3__ times per session. Do _2__ sessions per day.  Copyright  VHI. All rights reserved.    Scapula Adduction With Pectorals, Mid-Range   Stand in doorframe with palms against frame and arms at 90. Lean forward and squeeze shoulder blades. Hold _20__ seconds. Repeat _3__ times per session. Do _2__ sessions per day. \Scapula Adduction With Pectorals, High   Stand in doorframe with palms against frame and arms at 120. Lean forward and squeeze shoulder blades. Hold _20__ seconds. Repeat _3__ times per session. Do _2__ sessions per day.  Resisted External Rotation: in Neutral - Bilateral   PALMS UP Sit or stand, tubing in both hands, elbows at sides, bent to 90, forearms forward. Pinch shoulder blades together and rotate forearms out. Keep elbows at sides. Repeat __10__ times per set. Do _2-3___ sets per session. Do _2-3___ sessions per day.   Low Row: Standing   Face anchor, feet shoulder width apart. Palms up, pull arms back, squeezing shoulder blades together. Repeat 10__ times per set. Do 2-3__ sets per session. Do 2-3__ sessions per week. Anchor Height: Waist     Strengthening: Resisted Extension   Hold tubing in right hand, arm forward. Pull arm back, elbow straight. Repeat _10___ times per set. Do 2-3____ sets per session. Do 2-3____ sessions per day.  .Marland Kitchen

## 2015-05-26 ENCOUNTER — Encounter: Payer: Self-pay | Admitting: Rehabilitative and Restorative Service Providers"

## 2015-05-26 ENCOUNTER — Ambulatory Visit: Payer: Self-pay | Admitting: Sports Medicine

## 2015-05-26 ENCOUNTER — Other Ambulatory Visit: Payer: Self-pay | Admitting: Family Medicine

## 2015-06-01 ENCOUNTER — Ambulatory Visit (INDEPENDENT_AMBULATORY_CARE_PROVIDER_SITE_OTHER): Payer: 59 | Admitting: Rehabilitative and Restorative Service Providers"

## 2015-06-01 DIAGNOSIS — M25561 Pain in right knee: Secondary | ICD-10-CM

## 2015-06-01 DIAGNOSIS — M25562 Pain in left knee: Secondary | ICD-10-CM | POA: Diagnosis not present

## 2015-06-01 DIAGNOSIS — M6289 Other specified disorders of muscle: Secondary | ICD-10-CM | POA: Diagnosis not present

## 2015-06-01 DIAGNOSIS — R29898 Other symptoms and signs involving the musculoskeletal system: Secondary | ICD-10-CM

## 2015-06-01 DIAGNOSIS — Z7409 Other reduced mobility: Secondary | ICD-10-CM | POA: Diagnosis not present

## 2015-06-01 DIAGNOSIS — R531 Weakness: Secondary | ICD-10-CM

## 2015-06-01 DIAGNOSIS — R6889 Other general symptoms and signs: Secondary | ICD-10-CM

## 2015-06-01 NOTE — Therapy (Addendum)
[address] [place], [place], [address] Phone: [phone]   Fax:  [phone]  Physical Therapy Treatment  Patient Details  Name: Gabriel Gilbert MRN: [id number] Date of Birth: 1951/01/26 Referring Provider:  Silverio Decamp,*  Encounter Date: 06/01/2015      PT End of Session - 06/01/15 1435    Visit Number 3   Number of Visits 16   Date for PT Re-Evaluation 07/14/15   PT Start Time 6237   PT Stop Time 1522   PT Time Calculation (min) 50 min   Activity Tolerance Patient tolerated treatment well;No increased pain   Behavior During Therapy Flat affect;WFL for tasks assessed/performed      Past Medical History  Diagnosis Date  . Diabetes mellitus     type 2  . Hypertension   . Hyperlipidemia   . Depression   . Bell's palsy 12-10    Past Surgical History  Procedure Laterality Date  . Kidney stone removed    . Pfts      normal  . Cataract extraction Left     There were no vitals filed for this visit.  Visit Diagnosis:  Pain in joint, lower leg, right  Pain in joint, lower leg, left  Weakness of both hips  Decreased strength, endurance, and mobility      Subjective Assessment - 06/01/15 1436    Subjective Pt reports no changes since last visit. Pt reports he hasn't been compliant with HEP.  His knees continue to hurt when pushing clutch, and occasionally knee buckling with stairs. Pt reports he has no pain but has a little tingling down to Lt wrist.    Currently in Pain? No/denies            [facility] PT Assessment - 06/01/15 0001    Assessment   Medical Diagnosis Lt cervical radiculopathy; bilat knee pain   Onset Date/Surgical Date 01/26/15   Hand Dominance Left   Next MD Visit 1 month            [facility] Adult PT Treatment/Exercise - 06/01/15 0001    Exercises   Exercises Shoulder;Neck;Knee/Hip   Neck Exercises: Theraband   Rows 20 reps;Red   Shoulder External  Rotation 20 reps;Red   Neck Exercises: Seated   Other Seated Exercise upper trap stretch x 30 sec x 2 reps each side    Knee/Hip Exercises: Stretches   Passive Hamstring Stretch 2 reps;Right;Left;30 seconds   Knee/Hip Exercises: Aerobic   Nustep L4: 6 min    Knee/Hip Exercises: Seated   Long Arc Quad Strengthening;Right;Left;1 set;10 reps  hold for 5 in ext    Knee/Hip Flexion with red band x 15 reps each leg; required VC for form.                 PT Education - 06/01/15 1511    Education provided Yes   Education Details HEP- added seated hamstring stretch, LAQ with hold in extension, seated row with band and upper trap stretch.  Self care- pt educated on benefits of exercise for joint health and improved overall health/wellbeing.    Person(s) Educated Patient   Methods Handout;Explanation;Demonstration   Comprehension Returned demonstration;Verbalized understanding          PT Short Term Goals - 05/24/15 1232    PT SHORT TERM GOAL #1   Title Patient I in initial HEP - 06-16-15   Time 4   Period Weeks   Status On-going  PT SHORT TERM GOAL #2   Title Improve posture and alignment - 06-16-15   Period Weeks   Status On-going   PT SHORT TERM GOAL #3   Title Improve hamstring length to 75 degrees - 06-16-15   Time 4   Period Weeks   Status On-going           PT Long Term Goals - 05/24/15 1232    PT LONG TERM GOAL #1   Title Patient I in advanced HEP for d/c. - 07-14-15   Time 8   Period Weeks   Status On-going   PT LONG TERM GOAL #2   Title Increase strength bilat LE's to 4+/5 to 5/5 - 07-14-15   Time 8   Period Weeks   Status On-going   PT LONG TERM GOAL #3   Title IPatient to ascend and descend stairs with minimum to no pain 07-14-15   Time 8   Period Weeks   Status On-going   PT LONG TERM GOAL #4   Title Decrease FOTO to </= 32% limitation 07-14-15   Time 8   Period Weeks   Status On-going               Plan - 06/01/15 1500     Clinical Impression Statement Pt tolerated all exercises without any production of pain. Pt did require multiple cues for form and to stay on task during session. Issued seated simplified exercise in hopes of improved participation. Compliance with HEP barrier in progress. No goals met yet.     Pt will benefit from skilled therapeutic intervention in order to improve on the following deficits Decreased strength;Decreased mobility;Improper body mechanics;Postural dysfunction;Pain;Decreased activity tolerance   Rehab Potential Good   PT Frequency 2x / week   PT Duration 8 weeks   Consulted and Agree with Plan of Care Patient        Problem List Patient Active Problem List   Diagnosis Date Noted  . Left cervical radiculopathy 04/28/2015  . Chondromalacia of right patellofemoral joint 04/28/2015  . OSA on CPAP 06/26/2013  . Major depressive disorder, recurrent episode, severe, without mention of psychotic behavior 04/21/2013  . Generalized anxiety disorder 04/21/2013  . Obesity 02/14/2012  . RLS (restless legs syndrome) 03/12/2011  . DERMATITIS, HANDS 08/04/2010  . BELL'S PALSY 10/26/2009  . CORTICAL SENILE CATARACT 05/09/2009  . LUMBAR RADICULOPATHY 11/26/2008  . Diabetes mellitus without complication 46/65/9935  . INCONTINENCE, URGE 03/13/2007  . DEPRESSION, MAJOR, RECURRENT 08/20/2006  . TOBACCO DEPENDENCE 08/20/2006  . Essential hypertension, benign 08/20/2006  . KIDNEY STONE - NEPHROLITHIASIS 08/20/2006  . SLEEP APNEA 08/20/2006    Kerin Perna, PTA 06/01/2015 3:37 PM  [facility] [place], [place], [address] Phone: [phone]   Fax:  [phone]     PHYSICAL THERAPY DISCHARGE SUMMARY  Visits from Start of Care:3  Current functional level related to goals / functional outcomes: Continued functional limitations. No significant changes noted. Patient noncompliant with HEP and did not wish  to continue therapy   Remaining deficits: Unchanged from eval   Education / Equipment: HEP  Plan: Patient agrees to discharge.  Patient goals were not met. Patient is being discharged due to the patient's request.  ?????    Celyn P. Helene Kelp, PT, MPH 07/01/15 1:13pm

## 2015-06-01 NOTE — Patient Instructions (Addendum)
KNEE: Extension, Long Arc Quads - Sitting   Raise leg until knee is straight.  Hold 5-10 seconds.  _10__ reps per set, _1__ sets per day, _5__ days per week  Quadriceps (Stand)   Stand holding onto stationary object. Grasp right ankle. Pull knee back. Keep back straight. Do not rotate or arch back. Hold __20-30__ seconds. Repeat _2___ times. Do _1__ sessions per day. CAUTION: Stretch should be gentle, steady and slow.

## 2015-06-02 ENCOUNTER — Encounter: Payer: Self-pay | Admitting: Sports Medicine

## 2015-06-02 ENCOUNTER — Ambulatory Visit (INDEPENDENT_AMBULATORY_CARE_PROVIDER_SITE_OTHER): Payer: 59 | Admitting: Sports Medicine

## 2015-06-02 VITALS — BP 121/63 | HR 76 | Ht 69.0 in | Wt 194.0 lb

## 2015-06-02 DIAGNOSIS — M2241 Chondromalacia patellae, right knee: Secondary | ICD-10-CM

## 2015-06-02 DIAGNOSIS — M5412 Radiculopathy, cervical region: Secondary | ICD-10-CM

## 2015-06-02 MED ORDER — DICLOFENAC SODIUM 2 % TD SOLN: 2.0000 | Freq: Two times a day (BID) | TRANSDERMAL | Status: DC

## 2015-06-02 NOTE — Assessment & Plan Note (Signed)
Doing better but not perfect, does not desire any further interventional aggressive treatment. Return as needed.

## 2015-06-02 NOTE — Assessment & Plan Note (Signed)
Improved somewhat with physical therapy, is not desiring any further aggressive treatment. Return for custom orthotics and adding topical Pennsaid.

## 2015-06-02 NOTE — Progress Notes (Signed)
  Subjective:    CC: follow-up  HPI: Cervical radiculitis: Doing well after physical therapy, doesn't desire any further aggressive or invasive treatment.  Patellofemoral chondromalacia: Continues also do well after PT, amenable to try some custom orthotics but doesn't want to do anything else.  Past medical history, Surgical history, Family history not pertinant except as noted below, Social history, Allergies, and medications have been entered into the medical record, reviewed, and no changes needed.   Review of Systems: No fevers, chills, night sweats, weight loss, chest pain, or shortness of breath.   Objective:    General: Well Developed, well nourished, and in no acute distress.  Neuro: Alert and oriented x3, extra-ocular muscles intact, sensation grossly intact.  HEENT: Normocephalic, atraumatic, pupils equal round reactive to light, neck supple, no masses, no lymphadenopathy, thyroid nonpalpable.  Skin: Warm and dry, no rashes. Cardiac: Regular rate and rhythm, no murmurs rubs or gallops, no lower extremity edema.  Respiratory: Clear to auscultation bilaterally. Not using accessory muscles, speaking in full sentences. Bilateral Knee: Normal to inspection with no erythema or effusion or obvious bony abnormalities. Palpation normal with no warmth or joint line tenderness or patellar tenderness or condyle tenderness. ROM normal in flexion and extension and lower leg rotation. Ligaments with solid consistent endpoints including ACL, PCL, LCL, MCL. Negative Mcmurray's and provocative meniscal tests. Non painful patellar compression. Patellar and quadriceps tendons unremarkable. Hamstring and quadriceps strength is normal. Neck: Negative spurling's Full neck range of motion Grip strength and sensation normal in bilateral hands Strength good C4 to T1 distribution No sensory change to C4 to T1 Reflexes normal  Impression and Recommendations:    I spent 25 minutes with this  patient, greater than 50% was face-to-face time counseling regarding the above diagnoses

## 2015-06-03 ENCOUNTER — Other Ambulatory Visit: Payer: Self-pay | Admitting: Family Medicine

## 2015-06-17 ENCOUNTER — Encounter: Payer: Self-pay | Admitting: Sports Medicine

## 2015-06-17 ENCOUNTER — Ambulatory Visit (INDEPENDENT_AMBULATORY_CARE_PROVIDER_SITE_OTHER): Payer: 59 | Admitting: Sports Medicine

## 2015-06-17 VITALS — BP 115/64 | HR 75 | Ht 69.0 in | Wt 198.0 lb

## 2015-06-17 DIAGNOSIS — M2241 Chondromalacia patellae, right knee: Secondary | ICD-10-CM | POA: Diagnosis not present

## 2015-06-17 NOTE — Assessment & Plan Note (Signed)
Right-sided patellofemoral chondromalacia is likely secondary to severe pronation of the right arch, custom orthotics as above. Return to see me in one month.

## 2015-06-17 NOTE — Progress Notes (Signed)

## 2015-07-15 ENCOUNTER — Ambulatory Visit: Payer: Self-pay | Admitting: Sports Medicine

## 2015-07-19 ENCOUNTER — Ambulatory Visit: Payer: Self-pay | Admitting: Family Medicine

## 2015-08-20 ENCOUNTER — Other Ambulatory Visit: Payer: Self-pay | Admitting: Family Medicine

## 2015-09-29 ENCOUNTER — Other Ambulatory Visit: Payer: Self-pay | Admitting: Family Medicine

## 2015-11-05 ENCOUNTER — Other Ambulatory Visit: Payer: Self-pay | Admitting: Family Medicine

## 2015-11-08 ENCOUNTER — Telehealth: Payer: Self-pay

## 2015-11-08 NOTE — Telephone Encounter (Signed)
Left detailed VM notifying pt that he is required to come into the office for future refills of metformin. Refilled rx for 15 days to allow pt time to come in. If pt does not schedule an appoint during this refill request for metformin will be denied.

## 2015-11-11 ENCOUNTER — Encounter: Payer: Self-pay | Admitting: Family Medicine

## 2015-11-11 ENCOUNTER — Ambulatory Visit (INDEPENDENT_AMBULATORY_CARE_PROVIDER_SITE_OTHER): Payer: 59 | Admitting: Family Medicine

## 2015-11-11 VITALS — BP 138/65 | HR 73 | Wt 199.0 lb

## 2015-11-11 DIAGNOSIS — H109 Unspecified conjunctivitis: Secondary | ICD-10-CM

## 2015-11-11 DIAGNOSIS — I10 Essential (primary) hypertension: Secondary | ICD-10-CM

## 2015-11-11 DIAGNOSIS — E119 Type 2 diabetes mellitus without complications: Secondary | ICD-10-CM | POA: Diagnosis not present

## 2015-11-11 LAB — POCT GLYCOSYLATED HEMOGLOBIN (HGB A1C): Hemoglobin A1C: 7

## 2015-11-11 MED ORDER — EMPAGLIFLOZIN 10 MG PO TABS: 10.0000 mg | ORAL_TABLET | Freq: Every day | ORAL | Status: DC

## 2015-11-11 MED ORDER — ERYTHROMYCIN 5 MG/GM OP OINT: 1.0000 "application " | TOPICAL_OINTMENT | Freq: Three times a day (TID) | OPHTHALMIC | Status: DC

## 2015-11-11 MED ORDER — METFORMIN HCL 1000 MG PO TABS: ORAL_TABLET | ORAL | Status: DC

## 2015-11-11 MED ORDER — INSULIN GLARGINE 100 UNIT/ML SOLOSTAR PEN: 38.0000 [IU] | PEN_INJECTOR | Freq: Every day | SUBCUTANEOUS | Status: DC

## 2015-11-11 NOTE — Patient Instructions (Addendum)
Increase Lantus ot 38 units a day.  Bacterial Conjunctivitis Bacterial conjunctivitis, commonly called pink eye, is an inflammation of the clear membrane that covers the white part of the eye (conjunctiva). The inflammation can also happen on the underside of the eyelids. The blood vessels in the conjunctiva become inflamed, causing the eye to become red or pink. Bacterial conjunctivitis may spread easily from one eye to another and from person to person (contagious).  CAUSES  Bacterial conjunctivitis is caused by bacteria. The bacteria may come from your own skin, your upper respiratory tract, or from someone else with bacterial conjunctivitis. SYMPTOMS  The normally white color of the eye or the underside of the eyelid is usually pink or red. The pink eye is usually associated with irritation, tearing, and some sensitivity to light. Bacterial conjunctivitis is often associated with a thick, yellowish discharge from the eye. The discharge may turn into a crust on the eyelids overnight, which causes your eyelids to stick together. If a discharge is present, there may also be some blurred vision in the affected eye. DIAGNOSIS  Bacterial conjunctivitis is diagnosed by your caregiver through an eye exam and the symptoms that you report. Your caregiver looks for changes in the surface tissues of your eyes, which may point to the specific type of conjunctivitis. A sample of any discharge may be collected on a cotton-tip swab if you have a severe case of conjunctivitis, if your cornea is affected, or if you keep getting repeat infections that do not respond to treatment. The sample will be sent to a lab to see if the inflammation is caused by a bacterial infection and to see if the infection will respond to antibiotic medicines. TREATMENT   Bacterial conjunctivitis is treated with antibiotics. Antibiotic eyedrops are most often used. However, antibiotic ointments are also available. Antibiotics pills are  sometimes used. Artificial tears or eye washes may ease discomfort. HOME CARE INSTRUCTIONS   To ease discomfort, apply a cool, clean washcloth to your eye for 10-20 minutes, 3-4 times a day.  Gently wipe away any drainage from your eye with a warm, wet washcloth or a cotton ball.  Wash your hands often with soap and water. Use paper towels to dry your hands.  Do not share towels or washcloths. This may spread the infection.  Change or wash your pillowcase every day.  You should not use eye makeup until the infection is gone.  Do not operate machinery or drive if your vision is blurred.  Stop using contact lenses. Ask your caregiver how to sterilize or replace your contacts before using them again. This depends on the type of contact lenses that you use.  When applying medicine to the infected eye, do not touch the edge of your eyelid with the eyedrop bottle or ointment tube. SEEK IMMEDIATE MEDICAL CARE IF:   Your infection has not improved within 3 days after beginning treatment.  You had yellow discharge from your eye and it returns.  You have increased eye pain.  Your eye redness is spreading.  Your vision becomes blurred.  You have a fever or persistent symptoms for more than 2-3 days.  You have a fever and your symptoms suddenly get worse.  You have facial pain, redness, or swelling. MAKE SURE YOU:   Understand these instructions.  Will watch your condition.  Will get help right away if you are not doing well or get worse.   This information is not intended to replace advice given to you  by your health care provider. Make sure you discuss any questions you have with your health care provider.   Document Released: 10/29/2005 Document Revised: 11/19/2014 Document Reviewed: 03/31/2012 Elsevier Interactive Patient Education [employer]2016 Elsevier Inc.

## 2015-11-11 NOTE — Progress Notes (Signed)
   Subjective:    Patient ID: Gabriel Gilbert, male    DOB: 01-04-1951, 64 y.o.   MRN: [id number]  HPI Diabetes - no hypoglycemic events. No wounds or sores that are not healing well. No increased thirst or urination. Checking glucose at home. Taking medications as prescribed without any side effects.  Lab Results  Component Value Date   HGBA1C 7.5 04/15/2015   Hypertension- Pt denies chest pain, SOB, dizziness, or heart palpitations.  Taking meds as directed w/o problems.  Denies medication side effects.    Has had eye discharge for about 1-2 months. Says it is in both eyes. They water a lot. He wakes up with them matted every morning and then has to clean them off later in the day.  No visoin changes.     Review of Systems      Objective:   Physical Exam  Constitutional: He is oriented to person, place, and time. He appears well-developed and well-nourished.  HENT:  Head: Normocephalic and atraumatic.  Eyes: EOM are normal. Pupils are equal, round, and reactive to light.  Sclera is injected, mild lower lid edema. He does have some white crusting in the corners.  Cardiovascular: Normal rate, regular rhythm and normal heart sounds.   Pulmonary/Chest: Effort normal and breath sounds normal.  Slight expiratory wheeze at the bases bilaterally.   Neurological: He is alert and oriented to person, place, and time.  Skin: Skin is warm and dry.  Psychiatric: He has a normal mood and affect. His behavior is normal.          Assessment & Plan:  DM- much improved, implement A1c down to 7.0. Will inc lantus to 38 units.  Continue to work on diet and exercise and continue work on cutting back on sweet tea and sweetened coffee. Follow-up in 3 months.  HTN - well controlled. Current regimen.  conjiunctivitis, bilateral - will treat with erythromycin ophthalmic ointment. Call if not better in 1 week. Also encouraged him again with his eye doctor. He really needs an up-to-date eye exam  anyway and that when they can also address his eye issues especially if they have not improved. He could certainly have other concerns such as dry I allergic eye or blepharitis.

## 2016-01-01 DIAGNOSIS — R109 Unspecified abdominal pain: Secondary | ICD-10-CM | POA: Insufficient documentation

## 2016-01-01 DIAGNOSIS — N401 Enlarged prostate with lower urinary tract symptoms: Secondary | ICD-10-CM | POA: Insufficient documentation

## 2016-01-01 DIAGNOSIS — F17200 Nicotine dependence, unspecified, uncomplicated: Secondary | ICD-10-CM | POA: Insufficient documentation

## 2016-01-01 DIAGNOSIS — N201 Calculus of ureter: Secondary | ICD-10-CM | POA: Insufficient documentation

## 2016-01-01 DIAGNOSIS — N132 Hydronephrosis with renal and ureteral calculous obstruction: Secondary | ICD-10-CM | POA: Insufficient documentation

## 2016-01-06 ENCOUNTER — Telehealth: Payer: Self-pay

## 2016-01-06 MED ORDER — TAMSULOSIN HCL 0.4 MG PO CAPS: 0.4000 mg | ORAL_CAPSULE | Freq: Every day | ORAL | Status: DC

## 2016-01-06 NOTE — Telephone Encounter (Signed)
Anibal states he was seen at [facility]. He has kidney stones. He would like a prescription of Flomax sent to the pharmacy.

## 2016-01-06 NOTE — Telephone Encounter (Signed)
Ok, rx sent

## 2016-01-08 ENCOUNTER — Other Ambulatory Visit: Payer: Self-pay | Admitting: Family Medicine

## 2016-01-09 NOTE — Telephone Encounter (Signed)
Patient was aware.

## 2016-01-11 ENCOUNTER — Encounter: Payer: Self-pay | Admitting: Family Medicine

## 2016-01-11 ENCOUNTER — Ambulatory Visit (INDEPENDENT_AMBULATORY_CARE_PROVIDER_SITE_OTHER): Payer: 59 | Admitting: Family Medicine

## 2016-01-11 VITALS — BP 171/97 | HR 90 | Temp 97.8°F | Wt 192.0 lb

## 2016-01-11 DIAGNOSIS — Z794 Long term (current) use of insulin: Secondary | ICD-10-CM | POA: Diagnosis not present

## 2016-01-11 DIAGNOSIS — K047 Periapical abscess without sinus: Secondary | ICD-10-CM

## 2016-01-11 DIAGNOSIS — F17200 Nicotine dependence, unspecified, uncomplicated: Secondary | ICD-10-CM

## 2016-01-11 DIAGNOSIS — E11630 Type 2 diabetes mellitus with periodontal disease: Secondary | ICD-10-CM | POA: Diagnosis not present

## 2016-01-11 MED ORDER — AMOXICILLIN 500 MG PO CAPS: 500.0000 mg | ORAL_CAPSULE | Freq: Three times a day (TID) | ORAL | Status: DC

## 2016-01-11 NOTE — Progress Notes (Signed)
Gabriel Gilbert is a 65 y.o. male who presents to [facility] today for dental pain. Patient has a 3 to four-day history of right upper dental pain. Pain radiates to the face. No fevers chills nausea vomiting or diarrhea. Patient has tried some leftover hydrocodone from a kidney stone complication. This has helped. He notes poor dentition but does not have a dentist or go to a dentist regularly. His associated medical conditions include diabetes and smoking.   Past Medical History  Diagnosis Date  . Diabetes mellitus     type 2  . Hypertension   . Hyperlipidemia   . Depression   . Bell's palsy 12-10   Past Surgical History  Procedure Laterality Date  . Kidney stone removed    . Pfts      normal  . Cataract extraction Left    Social History  Substance Use Topics  . Smoking status: Current Every Day Smoker -- 2.00 packs/day for 25 years    Last Attempt to Quit: 02/10/2013  . Smokeless tobacco: Not on file     Comment: using electronic cigarettes  . Alcohol Use: No   family history includes Alzheimer's disease in his mother; COPD in his father, mother, and sister; Cancer in his sister; Diabetes in his father; Drug abuse in his son; Hypertension in his mother.  ROS as above Medications: Current Outpatient Prescriptions  Medication Sig Dispense Refill  . aspirin 81 MG tablet Take 81 mg by mouth daily.      Marland Kitchen atorvastatin (LIPITOR) 40 MG tablet Take 1 tablet (40 mg total) by mouth at bedtime. 90 tablet 3  . B-D UF III MINI PEN NEEDLES 31G X 5 MM MISC use once daily as directed by prescriber 100 each 3  . clonazePAM (KLONOPIN) 0.5 MG tablet Take 1 tablet (0.5 mg total) by mouth 2 (two) times daily as needed for anxiety. 20 tablet 0  . DULoxetine (CYMBALTA) 60 MG capsule Take 60 mg by mouth 2 (two) times daily.  0  . empagliflozin (JARDIANCE) 10 MG TABS tablet Take 10 mg by mouth  daily. 30 tablet 5  . erythromycin ophthalmic ointment Place 1 application into both eyes 3 (three) times daily. X 7 days. 3.5 g 0  . glucose blood (ONE TOUCH TEST STRIPS) test strip Pt testing one time a day. 100 each 3  . Insulin Glargine (LANTUS SOLOSTAR) 100 UNIT/ML Solostar Pen Inject 38 Units into the skin at bedtime. 20 mL 5  . Insulin Pen Needle (NOVOFINE) 32G X 6 MM MISC 1 Units by Does not apply route as directed. by Does not apply route as directed. 100 each 3  . lisinopril (PRINIVIL,ZESTRIL) 10 MG tablet Take 1 tablet (10 mg total) by mouth daily. KEEP APPT IN MARCH 90 tablet 0  . metFORMIN (GLUCOPHAGE) 1000 MG tablet take 1 tablet by mouth twice a day with food 90 tablet 1  . ONE TOUCH ULTRA TEST test strip TEST as directed 100 each 3  . tamsulosin (FLOMAX) 0.4 MG CAPS capsule Take 1 capsule (0.4 mg total) by mouth daily. 30 capsule 3  . amoxicillin (AMOXIL) 500 MG capsule Take 1 capsule (500 mg total) by mouth 3 (three) times daily. 30 capsule 0   No current facility-administered medications for this visit.   No Known Allergies   Exam:  BP 171/97 mmHg  Pulse 90  Temp(Src) 97.8 F (36.6 C) (Oral)  Wt 192 lb (87.091 kg) Gen:  Well NAD HEENT: EOMI,  MMM poor dentition throughout with multiple missing teeth erosions gingival hyperplasia. The right upper incisor is tender to touch. No visible abscess. The maxillary sinuses are nontender. Lungs: Normal work of breathing. CTABL Heart: RRR no MRG Abd: NABS, Soft. Nondistended, Nontender Exts: Brisk capillary refill, warm and well perfused.   No results found for this or any previous visit (from the past 24 hour(s)). No results found.   65 year old male with dental infection likely resulting from diabetes, and smoking and poor dental care. Treat temporarily with oral amoxicillin. Strongly recommend patient follow-up with a dentist. Additionally recommended smoking cessation. Follow-up with PCP.

## 2016-01-11 NOTE — Patient Instructions (Signed)
Thank you for coming in today. Take amoxicillin three times daily.  Follow up with a dentist.  Work on quitting smoking.  Follow up with Dr. [name].  Call or go to the emergency room if you get worse, have trouble breathing, have chest pains, or palpitations.   Dental Abscess A dental abscess is pus in or around a tooth. HOME CARE  Take medicines only as told by your dentist.  If you were prescribed antibiotic medicine, finish all of it even if you start to feel better.  Rinse your mouth (gargle) often with salt water.  Do not drive or use heavy machinery, like a lawn mower, while taking pain medicine.  Do not apply heat to the outside of your mouth.  Keep all follow-up visits as told by your dentist. This is important. GET HELP IF:  Your pain is worse, and medicine does not help. GET HELP RIGHT AWAY IF:  You have a fever or chills.  Your symptoms suddenly get worse.  You have a very bad headache.  You have problems breathing or swallowing.  You have trouble opening your mouth.  You have puffiness (swelling) in your neck or around your eye.   This information is not intended to replace advice given to you by your health care provider. Make sure you discuss any questions you have with your health care provider.   Document Released: 03/15/2015 Document Reviewed: 03/15/2015 Elsevier Interactive Patient Education [employer]

## 2016-01-17 ENCOUNTER — Ambulatory Visit (INDEPENDENT_AMBULATORY_CARE_PROVIDER_SITE_OTHER): Payer: 59 | Admitting: Family Medicine

## 2016-01-17 ENCOUNTER — Encounter: Payer: Self-pay | Admitting: Family Medicine

## 2016-01-17 VITALS — BP 140/69 | HR 74 | Ht 69.0 in | Wt 194.0 lb

## 2016-01-17 DIAGNOSIS — N200 Calculus of kidney: Secondary | ICD-10-CM | POA: Diagnosis not present

## 2016-01-17 DIAGNOSIS — R252 Cramp and spasm: Secondary | ICD-10-CM

## 2016-01-17 LAB — CBC WITH DIFFERENTIAL/PLATELET
Basophils Absolute: 0 10*3/uL (ref 0.0–0.1)
Basophils Relative: 0 % (ref 0–1)
Eosinophils Absolute: 0.4 10*3/uL (ref 0.0–0.7)
Eosinophils Relative: 4 % (ref 0–5)
HEMATOCRIT: 46.9 % (ref 39.0–52.0)
HEMOGLOBIN: 16.6 g/dL (ref 13.0–17.0)
LYMPHS PCT: 32 % (ref 12–46)
Lymphs Abs: 2.8 10*3/uL (ref 0.7–4.0)
MCH: 31.6 pg (ref 26.0–34.0)
MCHC: 35.4 g/dL (ref 30.0–36.0)
MCV: 89.3 fL (ref 78.0–100.0)
MONO ABS: 0.5 10*3/uL (ref 0.1–1.0)
MONOS PCT: 6 % (ref 3–12)
MPV: 9.9 fL (ref 8.6–12.4)
NEUTROS ABS: 5.1 10*3/uL (ref 1.7–7.7)
Neutrophils Relative %: 58 % (ref 43–77)
Platelets: 338 10*3/uL (ref 150–400)
RBC: 5.25 MIL/uL (ref 4.22–5.81)
RDW: 14.3 % (ref 11.5–15.5)
WBC: 8.8 10*3/uL (ref 4.0–10.5)

## 2016-01-17 LAB — LIPID PANEL
Cholesterol: 135 mg/dL (ref 125–200)
HDL: 34 mg/dL — AB (ref >=40)
LDL Cholesterol: 79 mg/dL (ref <130)
TRIGLYCERIDES: 108 mg/dL (ref <150)
Total CHOL/HDL Ratio: 4 Ratio (ref <=5.0)
VLDL: 22 mg/dL (ref <30)

## 2016-01-17 LAB — COMPLETE METABOLIC PANEL WITH GFR
ALBUMIN: 4.1 g/dL (ref 3.6–5.1)
ALK PHOS: 70 U/L (ref 40–115)
ALT: 11 U/L (ref 9–46)
AST: 15 U/L (ref 10–35)
BILIRUBIN TOTAL: 0.3 mg/dL (ref 0.2–1.2)
BUN: 18 mg/dL (ref 7–25)
CO2: 27 mmol/L (ref 20–31)
CREATININE: 0.84 mg/dL (ref 0.70–1.25)
Calcium: 9.6 mg/dL (ref 8.6–10.3)
Chloride: 101 mmol/L (ref 98–110)
GLUCOSE: 149 mg/dL — AB (ref 65–99)
Potassium: 4.8 mmol/L (ref 3.5–5.3)
Sodium: 137 mmol/L (ref 135–146)
TOTAL PROTEIN: 6.4 g/dL (ref 6.1–8.1)

## 2016-01-17 LAB — CK: Total CK: 55 U/L (ref 7–232)

## 2016-01-17 NOTE — Progress Notes (Signed)
Subjective:    Patient ID: Gabriel Gilbert, male    DOB: May 19, 1951, 65 y.o.   MRN: [id number]  HPI Right thigh has been cramping for 3 days. During more at night. He is not currently on a statin. He has been holding it. He denies any recent medication changes. That he did have surgery for kidney stones on 328. In fact he still has a stent in place. It is planned to be taken out next week on March 15. He still passing some blood intermittently in his urine and still having some urgency since having the stent placed. No fevers chills or sweats. He was also recently treated with antibiotics for a dental abscess. He has yet to find a dentist. The cramping has not affected any other extremities. He denies any injury or trauma or overuse that may have triggered his symptoms. He did start eating bananas to see if it would help. No worsening or alleviating factors. It does not seem to get worse with activity. No pain in the lumbar spine.  Review of Systems  BP 145/76 mmHg  Pulse 74  Ht  (1.753 m)  Wt 194 lb (87.998 kg)  BMI 28.64 kg/m2    No Known Allergies  Past Medical History  Diagnosis Date  . Diabetes mellitus     type 2  . Hypertension   . Hyperlipidemia   . Depression   . Bell's palsy 12-10    Past Surgical History  Procedure Laterality Date  . Kidney stone removed    . Pfts      normal  . Cataract extraction Left     Social History   Social History  . Marital Status: Divorced    Spouse Name: N/A  . Number of Children: N/A  . Years of Education: N/A   Occupational History  . Not on file.   Social History Main Topics  . Smoking status: Current Every Day Smoker -- 2.00 packs/day for 25 years    Last Attempt to Quit: 02/10/2013  . Smokeless tobacco: Not on file     Comment: using electronic cigarettes  . Alcohol Use: No  . Drug Use: No  . Sexual Activity: No   Other Topics Concern  . Not on file   Social History Narrative    Family History  Problem  Relation Age of Onset  . COPD Mother   . Hypertension Mother   . Alzheimer's disease Mother     ?  . Diabetes Father   . COPD Father   . COPD Sister   . Cancer Sister     lung  . Drug abuse Son     Outpatient Encounter Prescriptions as of 01/17/2016  Medication Sig  . amoxicillin (AMOXIL) 500 MG capsule Take 1 capsule (500 mg total) by mouth 3 (three) times daily.  Marland Kitchen aspirin 81 MG tablet Take 81 mg by mouth daily.    Marland Kitchen atorvastatin (LIPITOR) 40 MG tablet Take 1 tablet (40 mg total) by mouth at bedtime.  . B-D UF III MINI PEN NEEDLES 31G X 5 MM MISC use once daily as directed by prescriber  . clonazePAM (KLONOPIN) 0.5 MG tablet Take 1 tablet (0.5 mg total) by mouth 2 (two) times daily as needed for anxiety.  . DULoxetine (CYMBALTA) 60 MG capsule Take 60 mg by mouth 2 (two) times daily.  . empagliflozin (JARDIANCE) 10 MG TABS tablet Take 10 mg by mouth daily.  Marland Kitchen glucose blood (ONE TOUCH TEST STRIPS) test strip  Pt testing one time a day.  . Insulin Glargine (LANTUS SOLOSTAR) 100 UNIT/ML Solostar Pen Inject 38 Units into the skin at bedtime.  . Insulin Pen Needle (NOVOFINE) 32G X 6 MM MISC 1 Units by Does not apply route as directed. by Does not apply route as directed.  Marland Kitchen. lisinopril (PRINIVIL,ZESTRIL) 10 MG tablet Take 1 tablet (10 mg total) by mouth daily. KEEP APPT IN MARCH  . metFORMIN (GLUCOPHAGE) 1000 MG tablet take 1 tablet by mouth twice a day with food  . ONE TOUCH ULTRA TEST test strip TEST as directed  . tamsulosin (FLOMAX) 0.4 MG CAPS capsule Take 1 capsule (0.4 mg total) by mouth daily.  . [DISCONTINUED] erythromycin ophthalmic ointment Place 1 application into both eyes 3 (three) times daily. X 7 days.   No facility-administered encounter medications on file as of 01/17/2016.          Objective:   Physical Exam  Constitutional: He is oriented to person, place, and time. He appears well-developed and well-nourished.  HENT:  Head: Normocephalic and atraumatic.  Eyes:  Conjunctivae and EOM are normal.  Cardiovascular: Normal rate.   Pulmonary/Chest: Effort normal.  Musculoskeletal:  Hip, knee, ankle strength is 5 out of 5 bilaterally. Patellar reflexes are 2+ bilaterally.  Neurological: He is alert and oriented to person, place, and time.  Skin: Skin is dry. No pallor.  Psychiatric: He has a normal mood and affect. His behavior is normal.  Vitals reviewed.       Assessment & Plan:  Muscle cramping, right thigh-we'll check electrolytes and lytic including potassium and calcium.  Her hydrating well. On gentle stretches before bedtime. H.O given for hip flexor exercises.   Recent dental abscess-reminded him he needs to make an appointment with a dentist as soon as possible.  Kidney stones-following with urology. Hopefully will have his stent removed next week.

## 2016-01-17 NOTE — Patient Instructions (Signed)
Work on gentle stretches and exercises of the lower extremities and lumbar spine before bedtime.

## 2016-02-09 ENCOUNTER — Ambulatory Visit: Payer: Self-pay | Admitting: Family Medicine

## 2016-03-10 ENCOUNTER — Other Ambulatory Visit: Payer: Self-pay | Admitting: Family Medicine

## 2016-04-02 ENCOUNTER — Other Ambulatory Visit: Payer: Self-pay | Admitting: Family Medicine

## 2016-04-11 ENCOUNTER — Ambulatory Visit (INDEPENDENT_AMBULATORY_CARE_PROVIDER_SITE_OTHER): Payer: 59 | Admitting: Physician Assistant

## 2016-04-11 ENCOUNTER — Encounter: Payer: Self-pay | Admitting: Physician Assistant

## 2016-04-11 VITALS — BP 166/81 | HR 78 | Temp 98.3°F | Wt 198.0 lb

## 2016-04-11 DIAGNOSIS — K047 Periapical abscess without sinus: Secondary | ICD-10-CM | POA: Diagnosis not present

## 2016-04-11 DIAGNOSIS — K029 Dental caries, unspecified: Secondary | ICD-10-CM

## 2016-04-11 MED ORDER — AMOXICILLIN-POT CLAVULANATE 875-125 MG PO TABS: 1.0000 | ORAL_TABLET | Freq: Two times a day (BID) | ORAL | Status: DC

## 2016-04-11 NOTE — Progress Notes (Signed)
   Subjective:    Patient ID: Gabriel Gilbert, male    DOB: 02-11-1951, 65 y.o.   MRN: [id number]  HPI  Pt is a 65 yo male who presents to the clinic with 3 days of front tooth pain that radiates into his right sinuses. He has hx of infection from his teeth. He does not have dental insurance and his teeth are in bad condition. His front teeth are chipped off and he has cavities everywhere. No fever, chills, cough, n/v/d.     Review of Systems  All other systems reviewed and are negative.      Objective:   Physical Exam  Constitutional: He is oriented to person, place, and time. He appears well-developed and well-nourished.  HENT:  Head: Normocephalic and atraumatic.  Right Ear: External ear normal.  Left Ear: External ear normal.  Nose: Nose normal.  Mouth/Throat: No oropharyngeal exudate.  Poor dentition.  Front teeth are chipped at base.  Erythematous gums that are swollen over front teeth to the right.  Tenderness over right maxillary sinuses.  Generally swollen over right cheek.   Neck: Normal range of motion. Neck supple.  Cardiovascular: Normal rate, regular rhythm and normal heart sounds.   Pulmonary/Chest: Effort normal and breath sounds normal. He has no wheezes.  Lymphadenopathy:    He has no cervical adenopathy.  Neurological: He is alert and oriented to person, place, and time.  Psychiatric: He has a normal mood and affect. His behavior is normal.          Assessment & Plan:  Dental caries/tooth infection- concerned that infection could be spreading into right sinuses. Treated with augmentin for 10 days. HO given. Needs dental appt to consider removal tooth chips.

## 2016-04-11 NOTE — Patient Instructions (Signed)

## 2016-04-19 ENCOUNTER — Ambulatory Visit: Payer: Self-pay | Admitting: Family Medicine

## 2016-04-24 ENCOUNTER — Other Ambulatory Visit: Payer: Self-pay | Admitting: Family Medicine

## 2016-04-27 ENCOUNTER — Ambulatory Visit (INDEPENDENT_AMBULATORY_CARE_PROVIDER_SITE_OTHER): Payer: 59 | Admitting: Osteopathic Medicine

## 2016-04-27 ENCOUNTER — Encounter: Payer: Self-pay | Admitting: Osteopathic Medicine

## 2016-04-27 VITALS — BP 129/66 | HR 67 | Wt 197.0 lb

## 2016-04-27 DIAGNOSIS — E11630 Type 2 diabetes mellitus with periodontal disease: Secondary | ICD-10-CM

## 2016-04-27 DIAGNOSIS — Z794 Long term (current) use of insulin: Secondary | ICD-10-CM | POA: Diagnosis not present

## 2016-04-27 DIAGNOSIS — Z23 Encounter for immunization: Secondary | ICD-10-CM | POA: Diagnosis not present

## 2016-04-27 DIAGNOSIS — I10 Essential (primary) hypertension: Secondary | ICD-10-CM

## 2016-04-27 DIAGNOSIS — F329 Major depressive disorder, single episode, unspecified: Secondary | ICD-10-CM

## 2016-04-27 DIAGNOSIS — F32A Depression, unspecified: Secondary | ICD-10-CM

## 2016-04-27 LAB — POCT GLYCOSYLATED HEMOGLOBIN (HGB A1C): HEMOGLOBIN A1C: 7.2

## 2016-04-27 MED ORDER — METFORMIN HCL 1000 MG PO TABS: ORAL_TABLET | ORAL | Status: DC

## 2016-04-27 MED ORDER — EMPAGLIFLOZIN 10 MG PO TABS: 10.0000 mg | ORAL_TABLET | Freq: Every day | ORAL | Status: DC

## 2016-04-27 MED ORDER — INSULIN GLARGINE 100 UNIT/ML SOLOSTAR PEN: 38.0000 [IU] | PEN_INJECTOR | Freq: Every day | SUBCUTANEOUS | Status: DC

## 2016-04-27 NOTE — Progress Notes (Signed)
HPI: Gabriel Gilbert is a 65 y.o. male who presents to [facility] [place] today for chief complaint of:  Chief Complaint  Patient presents with  . Diabetes    Patient needs refill on metformin    Has not followed up as directed with PCP. Needs refill on metformin, so he's here today. Briefly reviewed other diabetic preventive care measures, he has not had eye exam, states it is difficult to get motivated to do this. Foot exam completed today, see nurse notes.  DIABETES SCREENING/PREVENTIVE CARE: A1C past 3-6 mos: Yes  controlled? Borderline (<8%, Could consider goal of 7%) BP goal <140/90: Yes LDL goal <70: No  Eye exam annually: No , importance discussed with patient Foot exam: Yes - 04/27/2016   Metformin: Yes  ACE/ARB: Yes  Statin: Yes    Hypertension: Controlled, no chest pain/pressure/shortness of breath  History of depression: Patient reports he has had difficulty motivating himself lately anything since this is affecting his ability to comply/follow up with appointments. He states he is pretty much stopped bothering to clean his house, he has a dog that he enjoys walking, reported some difficulty in his long-term relationship with girlfriend. No thoughts of hurting self or others   Past medical, social and family history reviewed: Past Medical History  Diagnosis Date  . Diabetes mellitus     type 2  . Hypertension   . Hyperlipidemia   . Depression   . Bell's palsy 12-10   Past Surgical History  Procedure Laterality Date  . Kidney stone removed    . Pfts      normal  . Cataract extraction Left    Social History  Substance Use Topics  . Smoking status: Current Every Day Smoker -- 2.00 packs/day for 25 years    Last Attempt to Quit: 02/10/2013  . Smokeless tobacco: Not on file     Comment: using electronic cigarettes  . Alcohol Use: No   Family History  Problem Relation Age of Onset  . COPD Mother   . Hypertension Mother   .  Alzheimer's disease Mother     ?  . Diabetes Father   . COPD Father   . COPD Sister   . Cancer Sister     lung  . Drug abuse Son     Current Outpatient Prescriptions  Medication Sig Dispense Refill  . clonazePAM (KLONOPIN) 0.5 MG tablet   0  . aspirin 81 MG tablet Take 81 mg by mouth daily.      Marland Kitchen. atorvastatin (LIPITOR) 40 MG tablet Take 1 tablet (40 mg total) by mouth at bedtime. 90 tablet 3  . B-D UF III MINI PEN NEEDLES 31G X 5 MM MISC use once daily as directed by prescriber 100 each 3  . empagliflozin (JARDIANCE) 10 MG TABS tablet Take 10 mg by mouth daily. 30 tablet 5  . glucose blood (ONE TOUCH TEST STRIPS) test strip Pt testing one time a day. 100 each 3  . Insulin Glargine (LANTUS SOLOSTAR) 100 UNIT/ML Solostar Pen Inject 38 Units into the skin at bedtime. 20 mL 5  . Insulin Pen Needle (NOVOFINE) 32G X 6 MM MISC 1 Units by Does not apply route as directed. by Does not apply route as directed. 100 each 3  . lisinopril (PRINIVIL,ZESTRIL) 10 MG tablet Take 1 tablet (10 mg total) by mouth daily. Needs f/u with PCP 90 tablet 0  . metFORMIN (GLUCOPHAGE) 1000 MG tablet take 1 tablet by mouth twice a  day with food APPOINTMENT FOR FURTHER REFILLS PER DR METHENY 15 tablet 0  . ONE TOUCH ULTRA TEST test strip TEST as directed 100 each 3   No current facility-administered medications for this visit.   No Known Allergies    Review of Systems: CONSTITUTIONAL:  No recent illness, HEAD/EYES/EARS/NOSE/THROAT: no vision change CARDIAC: No  chest pain, No  pressure, No palpitations RESPIRATORY: No  cough, No  shortness of breath ENDOCRINE: No polyuria/polydipsia/polyphagia, No  heat/cold intolerance  PSYCHIATRIC: No  concerns with depression, No  concerns with anxiety, No sleep problems  Exam:  BP 129/66 mmHg  Pulse 67  Wt 197 lb (89.359 kg)  SpO2 98% Constitutional: VS see above. General Appearance: alert, well-developed, well-nourished, NAD Eyes: Normal lids and conjunctive,  Neck:  No masses, trachea midline. No thyroid enlargement. No tenderness/mass appreciated. No lymphadenopathy Respiratory: Normal respiratory effort. no wheeze, no rhonchi, no rales Cardiovascular: S1/S2 normal, no murmur, no rub/gallop auscultated. RRR. No lower extremity edema.   Psychiatric: Fair judgment/insight. Depressed mood and affect. Oriented x3.    Results for orders placed or performed in visit on 04/27/16 (from the past 72 hour(s))  POCT glycosylated hemoglobin (Hb A1C)     Status: None   Collection Time: 04/27/16 11:11 AM  Result Value Ref Range   Hemoglobin A1C 7.2     No results found.   ASSESSMENT/PLAN:  Type 2 diabetes mellitus with periodontal disease, with long-term current use of insulin (HCC) - Plan: POCT glycosylated hemoglobin (Hb A1C)  Essential hypertension, benign  Need for diphtheria-tetanus-pertussis (Tdap) vaccine, adult/adolescent  Depression - Patient talked at some length about lack of motivation, but he is not interested in changing medications/referral. No SI/HI. Follow closely.      All questions were answered. Visit summary with medication list and pertinent instructions was printed for patient to review. ER/RTC precautions were reviewed with the patient. Return in about 3 months (around 07/28/2016), or sooner if needed, for DIABETES FOLLOW UP WIHT DR. M.

## 2016-04-27 NOTE — Patient Instructions (Signed)
Please make appointment for diabetic eye exam Follow up with Dr. Linford ArnoldMetheney in 3 months for routine diabetes monitoring - she may also discuss catching up on other vaccines such as shingles vaccine.

## 2016-06-05 ENCOUNTER — Encounter: Payer: Self-pay | Admitting: Internal Medicine

## 2016-07-27 ENCOUNTER — Ambulatory Visit: Payer: Self-pay | Admitting: Family Medicine

## 2016-07-31 ENCOUNTER — Other Ambulatory Visit: Payer: Self-pay | Admitting: Family Medicine

## 2016-08-12 ENCOUNTER — Other Ambulatory Visit: Payer: Self-pay | Admitting: Family Medicine

## 2016-08-30 ENCOUNTER — Other Ambulatory Visit: Payer: Self-pay | Admitting: Family Medicine

## 2016-09-05 ENCOUNTER — Other Ambulatory Visit: Payer: Self-pay | Admitting: Osteopathic Medicine

## 2016-09-05 ENCOUNTER — Telehealth: Payer: Self-pay | Admitting: Family Medicine

## 2016-09-05 ENCOUNTER — Other Ambulatory Visit: Payer: Self-pay | Admitting: Family Medicine

## 2016-09-05 NOTE — Telephone Encounter (Signed)
I called patient 2x and his phone number we have on file continues not to work or be out of working order. Patient needs to call his Doctor's office for a DM f/u appt

## 2016-10-08 ENCOUNTER — Ambulatory Visit: Payer: Self-pay | Admitting: Family Medicine

## 2016-10-10 ENCOUNTER — Other Ambulatory Visit: Payer: Self-pay | Admitting: Family Medicine

## 2016-10-15 ENCOUNTER — Ambulatory Visit (INDEPENDENT_AMBULATORY_CARE_PROVIDER_SITE_OTHER): Payer: 59 | Admitting: Family Medicine

## 2016-10-15 ENCOUNTER — Encounter: Payer: Self-pay | Admitting: Family Medicine

## 2016-10-15 VITALS — BP 120/62 | HR 61 | Ht 69.0 in | Wt 196.0 lb

## 2016-10-15 DIAGNOSIS — Z794 Long term (current) use of insulin: Secondary | ICD-10-CM

## 2016-10-15 DIAGNOSIS — E11630 Type 2 diabetes mellitus with periodontal disease: Secondary | ICD-10-CM | POA: Diagnosis not present

## 2016-10-15 DIAGNOSIS — I10 Essential (primary) hypertension: Secondary | ICD-10-CM

## 2016-10-15 LAB — POCT GLYCOSYLATED HEMOGLOBIN (HGB A1C): HEMOGLOBIN A1C: 6.5

## 2016-10-15 MED ORDER — EMPAGLIFLOZIN-METFORMIN HCL 5-1000 MG PO TABS: 1.0000 | ORAL_TABLET | Freq: Two times a day (BID) | ORAL | 5 refills | Status: DC

## 2016-10-15 MED ORDER — ATORVASTATIN CALCIUM 40 MG PO TABS: 40.0000 mg | ORAL_TABLET | Freq: Every day | ORAL | 3 refills | Status: DC

## 2016-10-15 MED ORDER — LISINOPRIL 10 MG PO TABS: 10.0000 mg | ORAL_TABLET | Freq: Every day | ORAL | 1 refills | Status: DC

## 2016-10-15 NOTE — Addendum Note (Signed)
Addended by: Nani GasserMETHENEY, Archibald Marchetta D on: 10/15/2016 03:00 PM   Modules accepted: Orders

## 2016-10-15 NOTE — Progress Notes (Signed)
Subjective:    CC:   HPI: Hypertension- Pt denies chest pain, SOB, dizziness, or heart palpitations.  Taking meds as directed w/o problems.  Denies medication side effects.    Diabetes - no hypoglycemic events. No wounds or sores that are not healing well. No increased thirst or urination. Checking glucose at home. Taking medications as prescribed without any side effects.  He says he stopped his statin because he saw some add some television was worried about potential side effects. He's been off of it for months.   Past medical history, Surgical history, Family history not pertinant except as noted below, Social history, Allergies, and medications have been entered into the medical record, reviewed, and corrections made.   Review of Systems: No fevers, chills, night sweats, weight loss, chest pain, or shortness of breath.   Objective:    General: Well Developed, well nourished, and in no acute distress.  Neuro: Alert and oriented x3, extra-ocular muscles intact, sensation grossly intact.  HEENT: Normocephalic, atraumatic  Skin: Warm and dry, no rashes. Cardiac: Regular rate and rhythm, no murmurs rubs or gallops, no lower extremity edema.  Respiratory: Clear to auscultation bilaterally. Not using accessory muscles, speaking in full sentences.   Impression and Recommendations:    HTN - Controlled. Continue current regimen. Follow-up in 3-4 months.  DM- Well-controlled. Hemoglobin A1c of 6.5 today which is down from previous. Is fantastic progress. In fact we are going to combine his Jardiance and metformin. He is due for BMP. Follow-up in 3-4 months. I reminded him once again to please schedule his eye appointment.  We did discuss restarting his statin. Discussed result television and try to reassure him that does reduce reduce the risk of heart attack and stroke.

## 2016-10-16 LAB — BASIC METABOLIC PANEL WITH GFR
BUN: 16 mg/dL (ref 7–25)
CALCIUM: 9.5 mg/dL (ref 8.6–10.3)
CO2: 27 mmol/L (ref 20–31)
Chloride: 105 mmol/L (ref 98–110)
Creat: 0.98 mg/dL (ref 0.70–1.25)
GFR, Est Non African American: 81 mL/min (ref >=60)
GLUCOSE: 81 mg/dL (ref 65–99)
Potassium: 4.5 mmol/L (ref 3.5–5.3)
Sodium: 139 mmol/L (ref 135–146)

## 2016-10-16 NOTE — Progress Notes (Signed)
All labs are normal. 

## 2016-10-19 IMAGING — CR DG KNEE COMPLETE 4+V*R*
5 series · 5 of 5 positions shown · non-contrast
Comparison: None.

CLINICAL DATA: Bilateral knee pain for years

EXAM:
LEFT KNEE - COMPLETE 4+ VIEW; RIGHT KNEE - COMPLETE 4+ VIEW

[knee lat (1 of 2)]
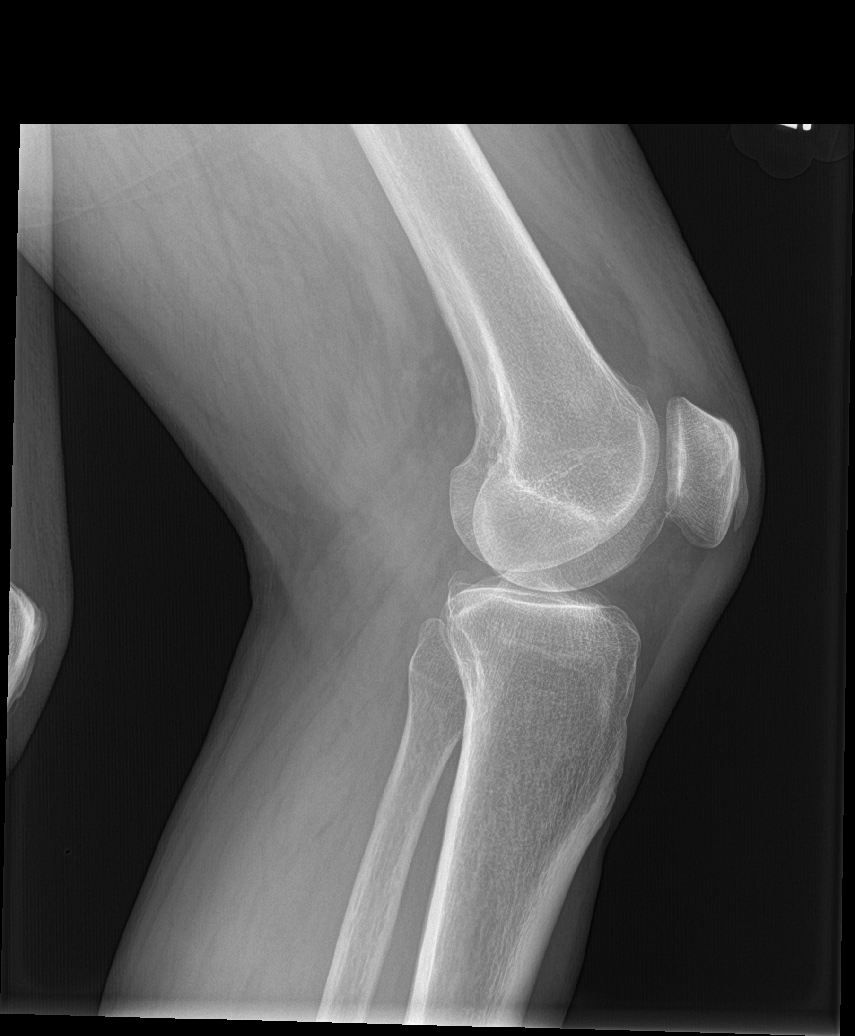

[knee ap bilat standing (1 of 2)]
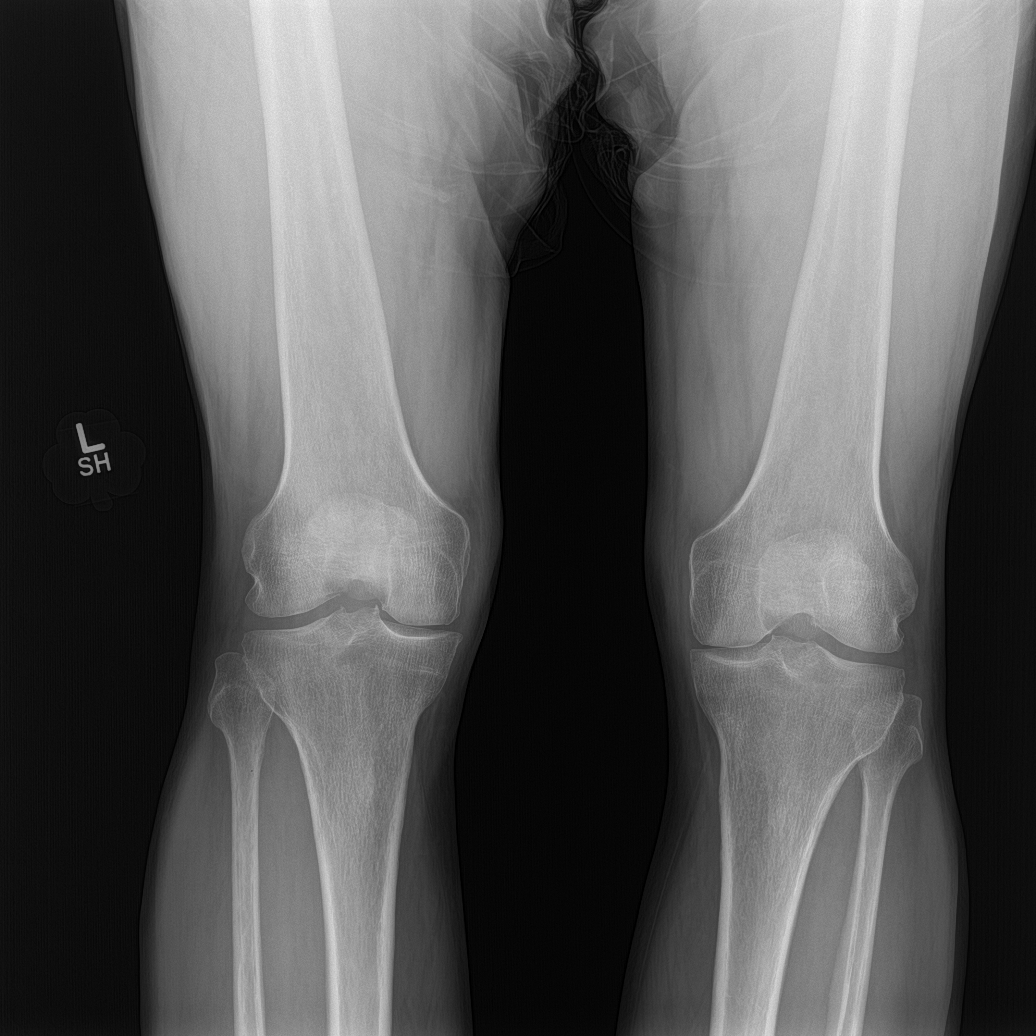

[knee ap bilat standing (2 of 2)]
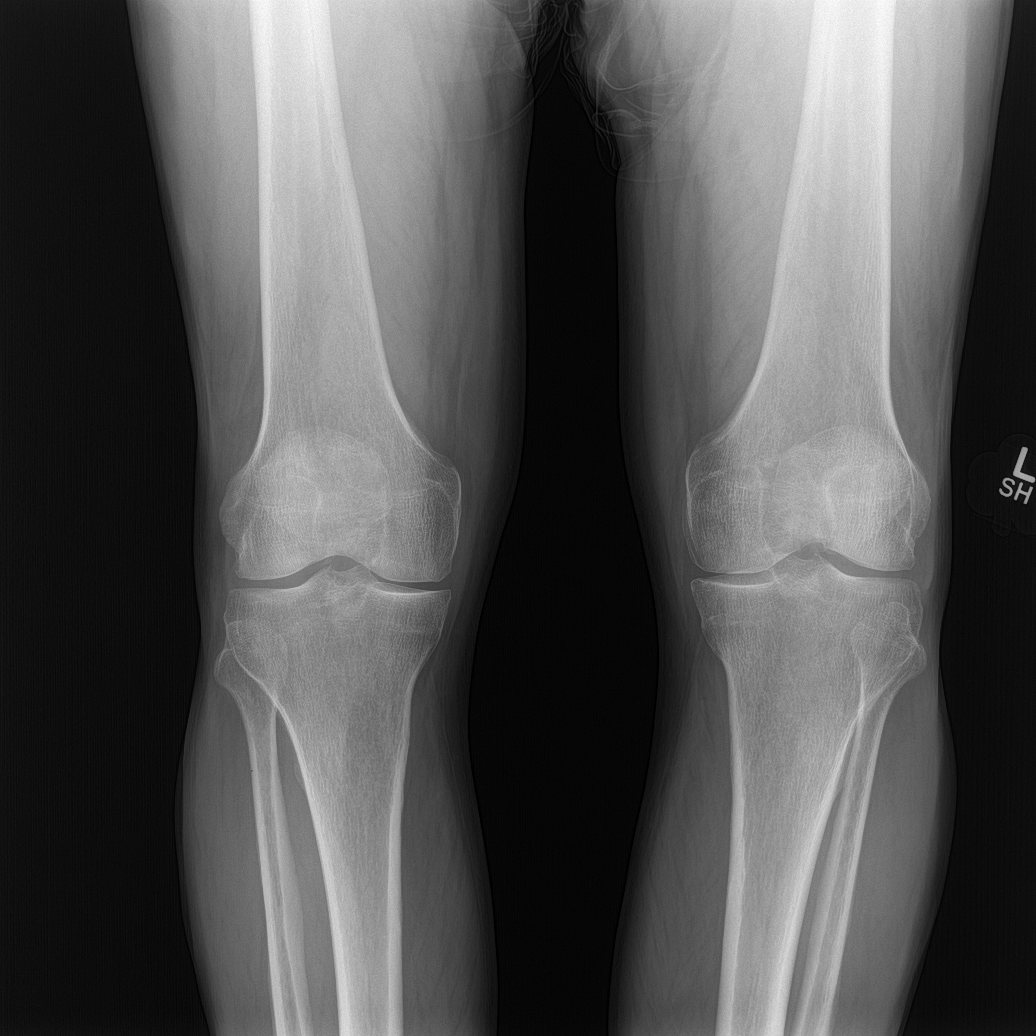

[knee lat (2 of 2)]
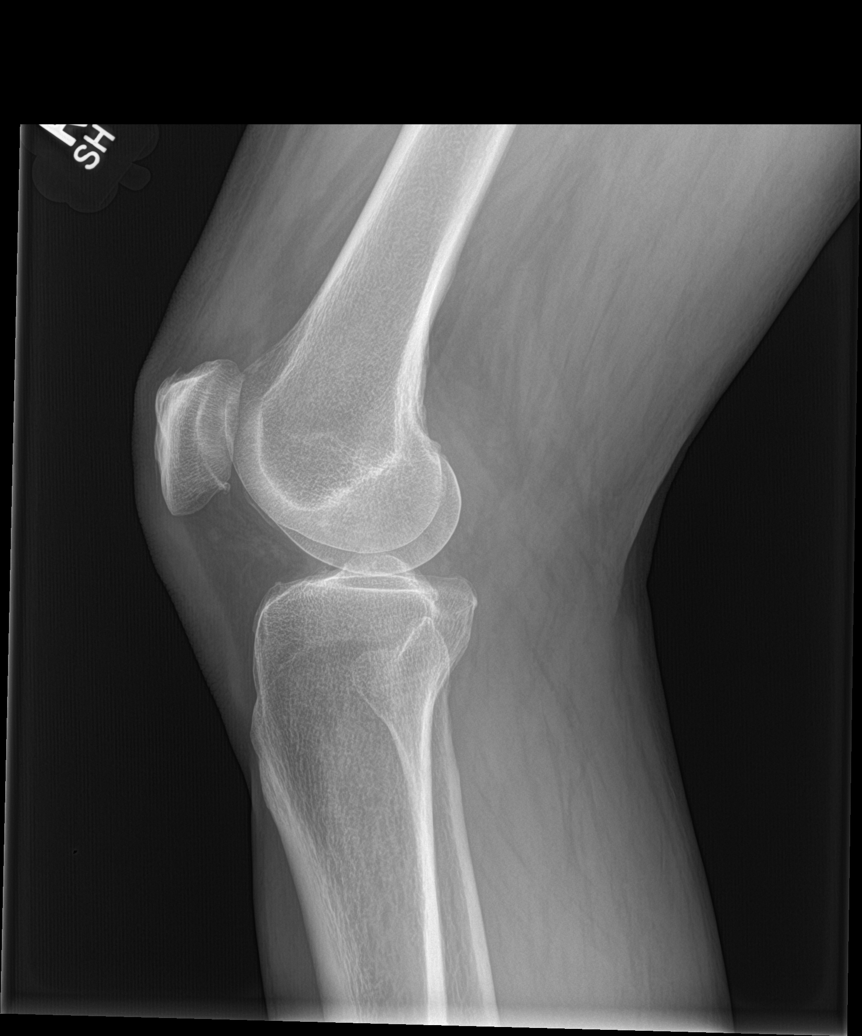

[knee sunrise]
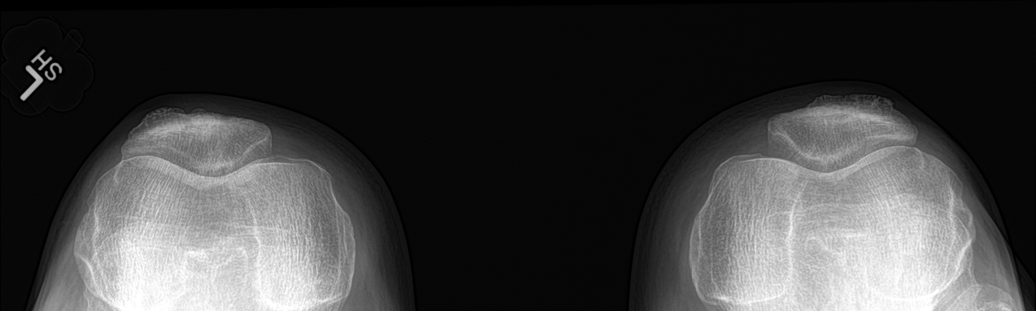

[5 of 5 positions shown; findings below may reference images not displayed]

FINDINGS: There is no acute fracture or dislocation. The joint spaces are
relatively well maintained. There is no significant joint effusion.
There is no lytic or sclerotic osseous lesion. The soft tissues are
normal. There is generalized osteopenia.
IMPRESSION: No acute osseous abnormality of bilateral knees.

## 2016-11-15 ENCOUNTER — Ambulatory Visit: Payer: Self-pay | Admitting: Family Medicine

## 2017-04-09 ENCOUNTER — Other Ambulatory Visit: Payer: Self-pay

## 2017-04-09 MED ORDER — BASAGLAR KWIKPEN 100 UNIT/ML ~~LOC~~ SOPN: 38.0000 [IU] | PEN_INJECTOR | Freq: Every day | SUBCUTANEOUS | 5 refills | Status: DC

## 2017-04-09 NOTE — Telephone Encounter (Signed)
What Pharmacy? I don't think [facility] is his regular pharmacy.

## 2017-04-09 NOTE — Telephone Encounter (Signed)
Okay, we can change Lantus to [employer]. Just please confirm the number of units he's currently using.

## 2017-04-09 NOTE — Telephone Encounter (Signed)
Pt talked with insurance company, and Gabriel Gilbert is covered by his insurance.  Please advise.

## 2017-04-09 NOTE — Telephone Encounter (Signed)
Sent to [facility] in [place].

## 2017-04-09 NOTE — Telephone Encounter (Signed)
Pt called and said that insurance is refusing to pay for insulin.  I instructed him to call his insurance to see what insulin they cover so I can send to PCP.

## 2017-04-09 NOTE — Telephone Encounter (Signed)
He is taking 38 units each night before bed.

## 2017-05-02 ENCOUNTER — Other Ambulatory Visit: Payer: Self-pay | Admitting: Family Medicine

## 2017-06-03 ENCOUNTER — Other Ambulatory Visit: Payer: Self-pay | Admitting: Family Medicine

## 2017-06-05 ENCOUNTER — Other Ambulatory Visit: Payer: Self-pay | Admitting: Family Medicine

## 2017-06-06 ENCOUNTER — Encounter: Payer: Self-pay | Admitting: Family Medicine

## 2017-06-06 ENCOUNTER — Ambulatory Visit (INDEPENDENT_AMBULATORY_CARE_PROVIDER_SITE_OTHER): Payer: 59 | Admitting: Family Medicine

## 2017-06-06 VITALS — BP 120/55 | HR 71 | Ht 69.0 in | Wt 193.0 lb

## 2017-06-06 DIAGNOSIS — Z125 Encounter for screening for malignant neoplasm of prostate: Secondary | ICD-10-CM

## 2017-06-06 DIAGNOSIS — Z794 Long term (current) use of insulin: Secondary | ICD-10-CM | POA: Diagnosis not present

## 2017-06-06 DIAGNOSIS — E11630 Type 2 diabetes mellitus with periodontal disease: Secondary | ICD-10-CM | POA: Diagnosis not present

## 2017-06-06 DIAGNOSIS — F331 Major depressive disorder, recurrent, moderate: Secondary | ICD-10-CM

## 2017-06-06 DIAGNOSIS — I10 Essential (primary) hypertension: Secondary | ICD-10-CM | POA: Diagnosis not present

## 2017-06-06 LAB — POCT GLYCOSYLATED HEMOGLOBIN (HGB A1C): HEMOGLOBIN A1C: 6.7

## 2017-06-06 MED ORDER — EMPAGLIFLOZIN-METFORMIN HCL 5-1000 MG PO TABS: 1.0000 | ORAL_TABLET | Freq: Two times a day (BID) | ORAL | 5 refills | Status: DC

## 2017-06-06 NOTE — Progress Notes (Signed)
Subjective:    CC: DM  HPI:  Diabetes - no hypoglycemic events. No wounds or sores that are not healing well. No increased thirst or urination. Not checking glucose at home. Taking medications as prescribed without any side effects. He is not having any difficulty getting his medications currently.  Hypertension- Pt denies chest pain, SOB, dizziness, or heart palpitations.  Taking meds as directed w/o problems.  Denies medication side effects.    Depression-he does report feeling down depressed and hopeless every day as well as low energy and fatigue. He is seeing psychiatry. Is currently on Cymbalta but unfortunately his co-pay is around $50 so he is trying to work something out with a psychiatrist. In fact they may actually be changing his medication.   Past medical history, Surgical history, Family history not pertinant except as noted below, Social history, Allergies, and medications have been entered into the medical record, reviewed, and corrections made.   Review of Systems: No fevers, chills, night sweats, weight loss, chest pain, or shortness of breath.   Objective:    General: Well Developed, well nourished, and in no acute distress.  Neuro: Alert and oriented x3, extra-ocular muscles intact, sensation grossly intact.  HEENT: Normocephalic, atraumatic  Skin: Warm and dry, no rashes. Cardiac: Regular rate and rhythm, no murmurs rubs or gallops, no lower extremity edema.  Respiratory: Clear to auscultation bilaterally. Not using accessory muscles, speaking in full sentences.   Impression and Recommendations:   DM - Well controlled. Hemoglobin A1c 6.7 today which is still a little up from previous of 6.5. Follow-up in 4 months. Foot exam performed today.  HTN - Well controlled. Continue current regimen. Follow up in  4 months.   Moderate depression - keep regular scheduled follow-up with psychiatry.

## 2017-06-07 LAB — COMPLETE METABOLIC PANEL WITH GFR
ALBUMIN: 4.1 g/dL (ref 3.6–5.1)
ALK PHOS: 61 U/L (ref 40–115)
ALT: 10 U/L (ref 9–46)
AST: 15 U/L (ref 10–35)
BUN: 13 mg/dL (ref 7–25)
CALCIUM: 9 mg/dL (ref 8.6–10.3)
CO2: 18 mmol/L — ABNORMAL LOW (ref 20–31)
CREATININE: 1 mg/dL (ref 0.70–1.25)
Chloride: 107 mmol/L (ref 98–110)
GFR, Est African American: >89 mL/min (ref >=60)
GFR, Est Non African American: 79 mL/min (ref >=60)
Glucose, Bld: 116 mg/dL — ABNORMAL HIGH (ref 65–99)
POTASSIUM: 4.4 mmol/L (ref 3.5–5.3)
Sodium: 137 mmol/L (ref 135–146)
Total Bilirubin: 0.3 mg/dL (ref 0.2–1.2)
Total Protein: 6.2 g/dL (ref 6.1–8.1)

## 2017-06-07 LAB — PSA: PSA: 1.2 ng/mL (ref <=4.0)

## 2017-06-07 LAB — LIPID PANEL W/REFLEX DIRECT LDL
CHOL/HDL RATIO: 5.7 ratio — AB (ref <5.0)
CHOLESTEROL: 154 mg/dL (ref <200)
HDL: 27 mg/dL — ABNORMAL LOW (ref >40)
LDL-Cholesterol: 89 mg/dL
Non-HDL Cholesterol (Calc): 127 mg/dL (ref <130)
Triglycerides: 315 mg/dL — ABNORMAL HIGH (ref <150)

## 2017-06-19 ENCOUNTER — Other Ambulatory Visit: Payer: Self-pay | Admitting: Family Medicine

## 2017-07-08 ENCOUNTER — Other Ambulatory Visit: Payer: Self-pay | Admitting: Family Medicine

## 2017-09-03 ENCOUNTER — Telehealth: Payer: Self-pay

## 2017-09-03 DIAGNOSIS — Z794 Long term (current) use of insulin: Principal | ICD-10-CM

## 2017-09-03 DIAGNOSIS — E11630 Type 2 diabetes mellitus with periodontal disease: Secondary | ICD-10-CM

## 2017-09-03 NOTE — Telephone Encounter (Signed)
Pt called and wants a referral to a dietician to help with his diabetes. Pt states that he wants one that is located in [place]. Please advise?

## 2017-09-03 NOTE — Telephone Encounter (Signed)
Referral placed.

## 2017-09-03 NOTE — Telephone Encounter (Signed)
Okay to place referral for diabetes and nutrition.  Can place as external for [facility].  It is the only place that offers nutrition here in [place].

## 2017-09-23 ENCOUNTER — Telehealth: Payer: Self-pay

## 2017-09-23 DIAGNOSIS — R69 Illness, unspecified: Secondary | ICD-10-CM | POA: Diagnosis not present

## 2017-09-23 NOTE — Telephone Encounter (Signed)
[facility] and now the [place] [facility] cost 300 a month. He would like to switch back to the two capsules.

## 2017-09-24 MED ORDER — METFORMIN HCL 1000 MG PO TABS
1000.0000 mg | ORAL_TABLET | Freq: Two times a day (BID) | ORAL | 3 refills | Status: DC
Start: 2017-09-24 — End: 2017-09-26

## 2017-09-24 MED ORDER — EMPAGLIFLOZIN 10 MG PO TABS: 10.0000 mg | ORAL_TABLET | Freq: Every day | ORAL | 5 refills | Status: DC

## 2017-09-24 NOTE — Telephone Encounter (Signed)
Cal pt: I split the meds apart and sent new Rx. Hopefully will be cheaper

## 2017-09-26 MED ORDER — EMPAGLIFLOZIN 10 MG PO TABS: 10.0000 mg | ORAL_TABLET | Freq: Every day | ORAL | 5 refills | Status: DC

## 2017-09-26 MED ORDER — METFORMIN HCL 1000 MG PO TABS: 1000.0000 mg | ORAL_TABLET | Freq: Two times a day (BID) | ORAL | 3 refills | Status: DC

## 2017-09-26 NOTE — Addendum Note (Signed)
Addended by: Chalmers CaterUTTLE, Cintya Daughety H on: 09/26/2017 10:00 AM   Modules accepted: Orders

## 2017-09-27 NOTE — Telephone Encounter (Signed)
Patient advised.

## 2017-10-07 ENCOUNTER — Encounter: Payer: Self-pay | Admitting: Family Medicine

## 2017-10-07 ENCOUNTER — Ambulatory Visit (INDEPENDENT_AMBULATORY_CARE_PROVIDER_SITE_OTHER): Payer: Medicare HMO | Admitting: Family Medicine

## 2017-10-07 VITALS — BP 131/63 | HR 59 | Ht 69.0 in | Wt 196.0 lb

## 2017-10-07 DIAGNOSIS — Z23 Encounter for immunization: Secondary | ICD-10-CM

## 2017-10-07 DIAGNOSIS — E11630 Type 2 diabetes mellitus with periodontal disease: Secondary | ICD-10-CM | POA: Diagnosis not present

## 2017-10-07 DIAGNOSIS — I10 Essential (primary) hypertension: Secondary | ICD-10-CM

## 2017-10-07 DIAGNOSIS — F331 Major depressive disorder, recurrent, moderate: Secondary | ICD-10-CM | POA: Diagnosis not present

## 2017-10-07 DIAGNOSIS — R69 Illness, unspecified: Secondary | ICD-10-CM | POA: Diagnosis not present

## 2017-10-07 DIAGNOSIS — Z794 Long term (current) use of insulin: Secondary | ICD-10-CM | POA: Diagnosis not present

## 2017-10-07 LAB — POCT GLYCOSYLATED HEMOGLOBIN (HGB A1C): HEMOGLOBIN A1C: 6.6

## 2017-10-07 MED ORDER — PAROXETINE HCL 20 MG PO TABS: ORAL_TABLET | ORAL | 1 refills | Status: DC

## 2017-10-07 NOTE — Progress Notes (Signed)
Subjective:    CC: HTN, DM  HPI:  Hypertension- Pt denies chest pain, SOB, dizziness, or heart palpitations.  Taking meds as directed w/o problems.  Denies medication side effects.    Diabetes - no hypoglycemic events. No wounds or sores that are not healing well. No increased thirst or urination. Checking glucose at home. Taking medications as prescribed without any side effects.  He recently switched to Medicare and unfortunately his [place] PepperJardiance was around $100.  He went ahead and paid for the medication.  He thinks he wants to stay with it.   Major depressive disorder-he went to see a psychiatrist about 2 weeks ago and when he told him he had new updated insurance which was Medicare they said they could not see him.  Then no longer except his current insurance.  He is now trying to see a new Psychiatrist.  They did not refill the Cymbalta but he says it really was not working well anyway.  They did refill his clonazepam.  He wants to know if he can try Paxil again.  He said he took it years ago  Past medical history, Surgical history, Family history not pertinant except as noted below, Social history, Allergies, and medications have been entered into the medical record, reviewed, and corrections made.   Review of Systems: No fevers, chills, night sweats, weight loss, chest pain, or shortness of breath.   Objective:    General: Well Developed, well nourished, and in no acute distress.  Neuro: Alert and oriented x3, extra-ocular muscles intact, sensation grossly intact.  HEENT: Normocephalic, atraumatic   Skin: Warm and dry, no rashes. Cardiac: Regular rate and rhythm, no murmurs rubs or gallops, no lower extremity edema.  Respiratory: Clear to auscultation bilaterally. Not using accessory muscles, speaking in full sentences.   Impression and Recommendations:    HTN - Well controlled. Continue current regimen. Follow up in  4 months.    Diabetes - Well controlled. Continue current  regimen. Follow up in  4 months.  Continue current regimen.  With  Major depressive disorder-we will restart Paxil.  Encouraged him to go down to behavioral health today and try to schedule a new patient appointment.  I do not nonbridging his prescriptions until he is able to get in with psychiatry.  Given flu vaccine.

## 2017-10-08 ENCOUNTER — Telehealth: Payer: Self-pay | Admitting: Family Medicine

## 2017-10-08 DIAGNOSIS — R69 Illness, unspecified: Secondary | ICD-10-CM | POA: Diagnosis not present

## 2017-10-08 MED ORDER — ATORVASTATIN CALCIUM 40 MG PO TABS
40.0000 mg | ORAL_TABLET | Freq: Every day | ORAL | 3 refills | Status: DC
Start: 2017-10-08 — End: 2019-01-19

## 2017-10-08 NOTE — Telephone Encounter (Signed)
Will lipitor for this year.

## 2017-10-10 ENCOUNTER — Telehealth: Payer: Self-pay

## 2017-10-10 DIAGNOSIS — F332 Major depressive disorder, recurrent severe without psychotic features: Secondary | ICD-10-CM

## 2017-10-10 DIAGNOSIS — F411 Generalized anxiety disorder: Secondary | ICD-10-CM

## 2017-10-10 NOTE — Telephone Encounter (Signed)
Okay to place referral for downstairs for behavioral health.

## 2017-10-10 NOTE — Telephone Encounter (Signed)
[name] called and states Behavioral Health needs a referral. Please advise.

## 2017-10-11 NOTE — Telephone Encounter (Signed)
Referral placed.

## 2017-10-15 DIAGNOSIS — Z794 Long term (current) use of insulin: Secondary | ICD-10-CM | POA: Diagnosis not present

## 2017-10-15 DIAGNOSIS — E119 Type 2 diabetes mellitus without complications: Secondary | ICD-10-CM | POA: Diagnosis not present

## 2017-10-30 ENCOUNTER — Telehealth: Payer: Self-pay

## 2017-10-30 NOTE — Telephone Encounter (Signed)
Gabriel Gilbert states he is no longer seeing the psychiatrist because he now has Medicare. He needs a refill on Clonazepam 0.5 mg BID. Please advise.

## 2017-10-30 NOTE — Telephone Encounter (Signed)
This referral for him for downstairs on November 30.  We need to call and verify whether or not he has an upcoming appointment.

## 2017-10-31 MED ORDER — CLONAZEPAM 0.5 MG PO TABS: 0.5000 mg | ORAL_TABLET | Freq: Two times a day (BID) | ORAL | 0 refills | Status: DC | PRN

## 2017-10-31 NOTE — Telephone Encounter (Signed)
He has an appointment on January 10th.

## 2017-10-31 NOTE — Telephone Encounter (Signed)
Pt called today. He is needing a refill on his Clonazepam.

## 2017-10-31 NOTE — Telephone Encounter (Signed)
Ver[facility] prescribing information in the [facility].  He gets clonazepam twice a day as needed #60 tabs per month and it was last filled in November.

## 2017-11-01 ENCOUNTER — Emergency Department
Admission: EM | Admit: 2017-11-01 | Discharge: 2017-11-01 | Discharge status: Absent without leave | Payer: Medicare HMO | Source: Home / Self Care

## 2017-11-01 NOTE — Telephone Encounter (Signed)
Patient advised.

## 2017-11-07 ENCOUNTER — Telehealth: Payer: Self-pay | Admitting: *Deleted

## 2017-11-07 NOTE — Telephone Encounter (Signed)
Pre Authorization sent to cover my meds. [id number]

## 2017-11-11 NOTE — Telephone Encounter (Signed)
London PepperJardiance has been approved from 11/10/2016-11/11/2017. (plan may be changing criteria as of 11/12/2017 hence the date 11/11/2017). Pharmacy notified and patient notified

## 2017-11-11 NOTE — Telephone Encounter (Signed)
Received notification from [facility]  For patient stating no PA is required for the jardiance for the 2019 plan year

## 2017-11-15 ENCOUNTER — Other Ambulatory Visit: Payer: Self-pay | Admitting: Family Medicine

## 2017-11-21 ENCOUNTER — Encounter (HOSPITAL_COMMUNITY): Payer: Self-pay | Admitting: Psychiatry

## 2017-11-21 ENCOUNTER — Other Ambulatory Visit: Payer: Self-pay

## 2017-11-21 ENCOUNTER — Ambulatory Visit (INDEPENDENT_AMBULATORY_CARE_PROVIDER_SITE_OTHER): Payer: Medicare HMO | Admitting: Psychiatry

## 2017-11-21 VITALS — BP 138/78 | HR 78 | Ht 69.0 in | Wt 189.0 lb

## 2017-11-21 DIAGNOSIS — F410 Panic disorder [episodic paroxysmal anxiety] without agoraphobia: Secondary | ICD-10-CM

## 2017-11-21 DIAGNOSIS — F411 Generalized anxiety disorder: Secondary | ICD-10-CM | POA: Diagnosis not present

## 2017-11-21 DIAGNOSIS — R69 Illness, unspecified: Secondary | ICD-10-CM | POA: Diagnosis not present

## 2017-11-21 DIAGNOSIS — F331 Major depressive disorder, recurrent, moderate: Secondary | ICD-10-CM | POA: Diagnosis not present

## 2017-11-21 DIAGNOSIS — Z813 Family history of other psychoactive substance abuse and dependence: Secondary | ICD-10-CM | POA: Diagnosis not present

## 2017-11-21 DIAGNOSIS — F17210 Nicotine dependence, cigarettes, uncomplicated: Secondary | ICD-10-CM | POA: Diagnosis not present

## 2017-11-21 DIAGNOSIS — Z810 Family history of intellectual disabilities: Secondary | ICD-10-CM

## 2017-11-21 DIAGNOSIS — F17208 Nicotine dependence, unspecified, with other nicotine-induced disorders: Secondary | ICD-10-CM

## 2017-11-21 MED ORDER — CLONAZEPAM 0.5 MG PO TABS: 0.5000 mg | ORAL_TABLET | Freq: Every day | ORAL | 0 refills | Status: DC | PRN

## 2017-11-21 MED ORDER — PAROXETINE HCL 20 MG PO TABS: ORAL_TABLET | ORAL | 1 refills | Status: DC

## 2017-11-21 NOTE — Progress Notes (Signed)
Psychiatric Initial Adult Assessment   Patient Identification: Gabriel Gilbert MRN:  [id number] Date of Evaluation:  11/21/2017 Referral Source: primary care.  Chief Complaint:   Chief Complaint    Establish Care; Depression     Visit Diagnosis:    ICD-10-CM   1. Major depressive disorder, recurrent episode, moderate (HCC) F33.1   2. GAD (generalized anxiety disorder) F41.1   3. Panic disorder F41.0   4. Nicotine dependence with other nicotine-induced disorder, unspecified nicotine product type F17.208     History of Present Illness:  67 years old single white male. Referred for management of depression  Has seen Dr. Randa Evens psychiatrist at [place]. He is not taking medicare so change of provider. Does fair on meds. But feels tired, amotivated. Changing smoking habits. Feels anxious History of panic attacks at age 2. Got admitted in hospital at times multiple stressors and was depressed. Since then on klonopine and different meds.  Has been thinking of mortaility, dying and anxiuos toughts. Recently started paxil, that has helped some remeron was changed to paxil recently. Somewhat better for anxiety  Aggravating factor: lonliness, finances, may claim bankruptcy ,modifying factor: dog  Severity of depression 6/10. 10 being no depression Duration since age 71's Timing more in the morning    Associated Signs/Symptoms: Depression Symptoms:  insomnia, difficulty concentrating, anxiety, loss of energy/fatigue, disturbed sleep, (Hypo) Manic Symptoms:  Distractibility, Anxiety Symptoms:  Excessive Worry, Psychotic Symptoms:  denies PTSD Symptoms: NA  Past Psychiatric History: depression ,anxiety  Previous Psychotropic Medications: Yes   Substance Abuse History in the last 12 months:  No.  Consequences of Substance Abuse: NA  Past Medical History:  Past Medical History:  Diagnosis Date  . Bell's palsy 12-10  . Depression   . Diabetes mellitus    type 2  .  Hyperlipidemia   . Hypertension     Past Surgical History:  Procedure Laterality Date  . CATARACT EXTRACTION Left   . kidney stone removed    . pfts     normal    Family Psychiatric History: anxiety. Mom used valium  Family History:  Family History  Problem Relation Age of Onset  . COPD Mother   . Hypertension Mother   . Alzheimer's disease Mother        ?  . Diabetes Father   . COPD Father   . Drug abuse Son   . COPD Sister   . Cancer Sister        lung    Social History:   Social History   Socioeconomic History  . Marital status: Single    Spouse name: None  . Number of children: None  . Years of education: None  . Highest education level: None  Social Needs  . Financial resource strain: None  . Food insecurity - worry: None  . Food insecurity - inability: None  . Transportation needs - medical: None  . Transportation needs - non-medical: None  Occupational History  . None  Tobacco Use  . Smoking status: Current Every Day Smoker    Packs/day: 1.00    Years: 25.00    Pack years: 25.00    Types: E-cigarettes    Last attempt to quit: 02/10/2013    Years since quitting: 4.7  . Smokeless tobacco: Never Used  . Tobacco comment: using electronic cigarettes  Substance and Sexual Activity  . Alcohol use: No    Alcohol/week: 0.0 oz  . Drug use: No  . Sexual activity: No  Partners: Female  Other Topics Concern  . None  Social History Narrative  . None    Additional Social History: Grew up with parents. One sbiling. No trauma  now retired. Married once. Has one son who he kicked out as he was using drugs   Allergies:  No Known Allergies  Metabolic Disorder Labs: Lab Results  Component Value Date   HGBA1C 6.6 10/07/2017   No results found for: PROLACTIN Lab Results  Component Value Date   CHOL 154 06/06/2017   TRIG 315 (H) 06/06/2017   HDL 27 (L) 06/06/2017   CHOLHDL 5.7 (H) 06/06/2017   VLDL 22 01/17/2016   LDLCALC 79 01/17/2016   LDLCALC  81 01/26/2015     Current Medications: Current Outpatient Medications  Medication Sig Dispense Refill  . atorvastatin (LIPITOR) 40 MG tablet Take 1 tablet (40 mg total) by mouth at bedtime. 90 tablet 3  . B-D UF III MINI PEN NEEDLES 31G X 5 MM MISC USE ONE - DAILY 100 each 0  . clonazePAM (KLONOPIN) 0.5 MG tablet Take 1 tablet (0.5 mg total) by mouth daily as needed for anxiety. 30 tablet 0  . empagliflozin (JARDIANCE) 10 MG TABS tablet Take 10 mg daily by mouth. 30 tablet 5  . glucose blood (ONE TOUCH TEST STRIPS) test strip Pt testing one time a day. 100 each 3  . Insulin Glargine (BASAGLAR KWIKPEN) 100 UNIT/ML SOPN Inject 0.38 mLs (38 Units total) into the skin at bedtime. 12 mL 5  . lisinopril (PRINIVIL,ZESTRIL) 10 MG tablet TAKE 1 TABLET BY MOUTH EVERY DAY 90 tablet 1  . metFORMIN (GLUCOPHAGE) 1000 MG tablet Take 1 tablet (1,000 mg total) 2 (two) times daily with a meal by mouth. 180 tablet 3  . PARoxetine (PAXIL) 20 MG tablet One a day 30 tablet 1  . aspirin 81 MG tablet Take 81 mg by mouth daily.      . ONE TOUCH ULTRA TEST test strip TEST as directed 100 each 3   No current facility-administered medications for this visit.     Neurologic: Headache: No Seizure: No Paresthesias:No  Musculoskeletal: Strength & Muscle Tone: within normal limits Gait & Station: normal Patient leans: no lean  Psychiatric Specialty Exam: Review of Systems  Cardiovascular: Negative for chest pain.  Skin: Negative for rash.  Psychiatric/Behavioral: Positive for depression. Negative for suicidal ideas.    Blood pressure 138/78, pulse 78, height 5\' 9"  (1.753 m), weight 189 lb (85.7 kg).Body mass index is 27.91 kg/m.  General Appearance: Casual  Eye Contact:  Fair  Speech:  Slow  Volume:  Decreased  Mood:  Depressed  Affect:  Congruent  Thought Process:  Goal Directed  Orientation:  Full (Time, Place, and Person)  Thought Content:  Rumination  Suicidal Thoughts:  No  Homicidal Thoughts:   No  Memory:  Immediate;   Fair Recent;   Fair  Judgement:  Poor  Insight:  Shallow  Psychomotor Activity:  Decreased  Concentration:  Concentration: Fair and Attention Span: Fair  Recall:  [employer]  Fund of Knowledge:Fair  Language: Fair  Akathisia:  Negative  Handed:  Right  AIMS (if indicated):  0  Assets:  Desire for Improvement  ADL's:  Intact  Cognition: WNL  Sleep:  fair    Treatment Plan Summary: Medication management and Plan as follows   1. Major depression moderate recurrent: recently started paxil, would give more time. Somewhat better response then remeron 2. GAD: manageable on klonopine. Advised to keep one instead of  twice a day, continue paxil 3. Panic attacks: infrequent. Work on lowering klonopine 4. Nicotine dependence: now taking e cig. Working on cutting down  Reviewed healthy life style. Increase acitivites drink more water in the morning . Add fruits and vegetables Avoid iced tea. Work on weight maintenance FU 3 weeks. Schedule for therapy to work on coping skills   Thresa Ross, MD 1/10/20192:46 PM

## 2017-11-22 DIAGNOSIS — R69 Illness, unspecified: Secondary | ICD-10-CM | POA: Diagnosis not present

## 2017-11-25 ENCOUNTER — Other Ambulatory Visit: Payer: Self-pay | Admitting: Family Medicine

## 2017-12-16 ENCOUNTER — Other Ambulatory Visit: Payer: Self-pay

## 2017-12-16 ENCOUNTER — Encounter (HOSPITAL_COMMUNITY): Payer: Self-pay | Admitting: Psychiatry

## 2017-12-16 ENCOUNTER — Ambulatory Visit (INDEPENDENT_AMBULATORY_CARE_PROVIDER_SITE_OTHER): Payer: Medicare HMO | Admitting: Psychiatry

## 2017-12-16 VITALS — BP 138/78 | HR 78 | Ht 69.0 in | Wt 190.0 lb

## 2017-12-16 DIAGNOSIS — F17208 Nicotine dependence, unspecified, with other nicotine-induced disorders: Secondary | ICD-10-CM

## 2017-12-16 DIAGNOSIS — F410 Panic disorder [episodic paroxysmal anxiety] without agoraphobia: Secondary | ICD-10-CM

## 2017-12-16 DIAGNOSIS — Z599 Problem related to housing and economic circumstances, unspecified: Secondary | ICD-10-CM | POA: Diagnosis not present

## 2017-12-16 DIAGNOSIS — F17210 Nicotine dependence, cigarettes, uncomplicated: Secondary | ICD-10-CM

## 2017-12-16 DIAGNOSIS — F411 Generalized anxiety disorder: Secondary | ICD-10-CM

## 2017-12-16 DIAGNOSIS — F331 Major depressive disorder, recurrent, moderate: Secondary | ICD-10-CM | POA: Diagnosis not present

## 2017-12-16 DIAGNOSIS — Z813 Family history of other psychoactive substance abuse and dependence: Secondary | ICD-10-CM

## 2017-12-16 DIAGNOSIS — R69 Illness, unspecified: Secondary | ICD-10-CM | POA: Diagnosis not present

## 2017-12-16 MED ORDER — BUPROPION HCL ER (SR) 100 MG PO TB12: 100.0000 mg | ORAL_TABLET | Freq: Every day | ORAL | 0 refills | Status: DC

## 2017-12-16 NOTE — Progress Notes (Signed)
[facility] Outpatient Follow up visit   Patient Identification: Gabriel Gilbert MRN:  [id number] Date of Evaluation:  12/16/2017 Referral Source: primary care.  Chief Complaint:   Chief Complaint    Follow-up; Other     Visit Diagnosis:    ICD-10-CM   1. Major depressive disorder, recurrent episode, moderate (HCC) F33.1   2. GAD (generalized anxiety disorder) F41.1   3. Panic disorder F41.0   4. Nicotine dependence with other nicotine-induced disorder, unspecified nicotine product type F17.208     History of Present Illness:  67 years old single white male. Referred for management of depression  Has seen Dr. Randa Evens psychiatrist at [place]. He is not taking medicare so change of provider.   Has been feeling low, amotivated , anxiety was somewhat better last time so continue paxil Still feels low, talks about mortality Tries to go [place] visit friends otherwise feel lonely Not have cut down smoking. Also not nutritionally good diet and drinking less water  difficult to change habits  Aggravating factor: lonliness, finances, may claim bankruptcy ,modifying factor: dog  Severity of depression: 6/10 Duration since age 6's Timing more in morning    Past Psychiatric History: depression ,anxiety  Previous Psychotropic Medications: Yes   Substance Abuse History in the last 12 months:  No.  Consequences of Substance Abuse: NA  Past Medical History:  Past Medical History:  Diagnosis Date  . Bell's palsy 12-10  . Depression   . Diabetes mellitus    type 2  . Hyperlipidemia   . Hypertension     Past Surgical History:  Procedure Laterality Date  . CATARACT EXTRACTION Left   . kidney stone removed    . pfts     normal    Family Psychiatric History: anxiety. Mom used valium  Family History:  Family History  Problem Relation Age of Onset  . COPD Mother   . Hypertension Mother   . Alzheimer's disease Mother        ?  . Diabetes Father   . COPD Father   . Drug  abuse Son   . COPD Sister   . Cancer Sister        lung    Social History:   Social History   Socioeconomic History  . Marital status: Single    Spouse name: None  . Number of children: None  . Years of education: None  . Highest education level: None  Social Needs  . Financial resource strain: None  . Food insecurity - worry: None  . Food insecurity - inability: None  . Transportation needs - medical: None  . Transportation needs - non-medical: None  Occupational History  . None  Tobacco Use  . Smoking status: Current Every Day Smoker    Packs/day: 1.00    Years: 25.00    Pack years: 25.00    Types: E-cigarettes    Last attempt to quit: 02/10/2013    Years since quitting: 4.8  . Smokeless tobacco: Never Used  . Tobacco comment: using electronic cigarettes  Substance and Sexual Activity  . Alcohol use: No    Alcohol/week: 0.0 oz  . Drug use: No  . Sexual activity: No    Partners: Female  Other Topics Concern  . None  Social History Narrative  . None      Allergies:  No Known Allergies  Metabolic Disorder Labs: Lab Results  Component Value Date   HGBA1C 6.6 10/07/2017   No results found for: PROLACTIN Lab Results  Component Value Date   CHOL 154 06/06/2017   TRIG 315 (H) 06/06/2017   HDL 27 (L) 06/06/2017   CHOLHDL 5.7 (H) 06/06/2017   VLDL 22 01/17/2016   LDLCALC 79 01/17/2016   LDLCALC 81 01/26/2015     Current Medications: Current Outpatient Medications  Medication Sig Dispense Refill  . aspirin 81 MG tablet Take 81 mg by mouth daily.      Marland Kitchen. atorvastatin (LIPITOR) 40 MG tablet Take 1 tablet (40 mg total) by mouth at bedtime. 90 tablet 3  . B-D UF III MINI PEN NEEDLES 31G X 5 MM MISC USE ONE - DAILY 100 each 0  . clonazePAM (KLONOPIN) 0.5 MG tablet Take 1 tablet (0.5 mg total) by mouth daily as needed for anxiety. 30 tablet 0  . empagliflozin (JARDIANCE) 10 MG TABS tablet Take 10 mg daily by mouth. 30 tablet 5  . glucose blood (ONE TOUCH TEST  STRIPS) test strip Pt testing one time a day. 100 each 3  . Insulin Glargine (BASAGLAR KWIKPEN) 100 UNIT/ML SOPN INJECT 38 UNITS INTO SKIN EVERY NIGHT AT BEDTIME 15 mL 5  . lisinopril (PRINIVIL,ZESTRIL) 10 MG tablet TAKE 1 TABLET BY MOUTH EVERY DAY 90 tablet 1  . metFORMIN (GLUCOPHAGE) 1000 MG tablet Take 1 tablet (1,000 mg total) 2 (two) times daily with a meal by mouth. 180 tablet 3  . ONE TOUCH ULTRA TEST test strip TEST as directed 100 each 3  . PARoxetine (PAXIL) 20 MG tablet One a day 30 tablet 1  . buPROPion (WELLBUTRIN SR) 100 MG 12 hr tablet Take 1 tablet (100 mg total) by mouth daily. 30 tablet 0   No current facility-administered medications for this visit.       Psychiatric Specialty Exam: Review of Systems  Cardiovascular: Negative for chest pain.  Skin: Negative for rash.  Psychiatric/Behavioral: Positive for depression. Negative for suicidal ideas.    Blood pressure 138/78, pulse 78, height 5\' 9"  (1.753 m), weight 190 lb (86.2 kg).Body mass index is 28.06 kg/m.  General Appearance: Casual  Eye Contact:  Fair  Speech:  Slow  Volume:  Decreased  Mood:  dysphoric  Affect:  congruent  Thought Process:  Goal Directed  Orientation:  Full (Time, Place, and Person)  Thought Content:  Rumination  Suicidal Thoughts:  No  Homicidal Thoughts:  No  Memory:  Immediate;   Fair Recent;   Fair  Judgement:  Poor  Insight:  Shallow  Psychomotor Activity:  Decreased  Concentration:  Concentration: Fair and Attention Span: Fair  Recall:  [employer]  Fund of Knowledge:Fair  Language: Fair  Akathisia:  Negative  Handed:  Right  AIMS (if indicated):  0  Assets:  Desire for Improvement  ADL's:  Intact  Cognition: WNL  Sleep:  fair    Treatment Plan Summary: Medication management and Plan as follows   1. Major depression moderate recurrent: ongoing. Continue paxil. Add wellbutrin during the day refer to therapy 2. GAD: fluctuates. klonopine helps. Continue paixl 3. Panic  attacks: infrequent. Continue meds 4. Nicotine dependence: now taking e cig. Working on cutting down  Work on adding more water and actitivites  refer to therapy to deal with lonliness, coping skills and mortality worries.  Not suicidal FU 4 weeks. Reviewed meds and side effects   Thresa RossNadeem Tamon Parkerson, MD 2/4/20193:18 PM

## 2017-12-16 NOTE — Patient Instructions (Signed)
Refer to therapy 

## 2018-01-01 ENCOUNTER — Other Ambulatory Visit (HOSPITAL_COMMUNITY): Payer: Self-pay | Admitting: Psychiatry

## 2018-01-04 ENCOUNTER — Emergency Department (INDEPENDENT_AMBULATORY_CARE_PROVIDER_SITE_OTHER)
Admission: EM | Admit: 2018-01-04 | Discharge: 2018-01-04 | Disposition: A | Discharge status: Home / Self Care | Payer: Medicare HMO | Source: Home / Self Care

## 2018-01-04 ENCOUNTER — Encounter: Payer: Self-pay | Admitting: Emergency Medicine

## 2018-01-04 DIAGNOSIS — R69 Illness, unspecified: Secondary | ICD-10-CM | POA: Diagnosis not present

## 2018-01-04 DIAGNOSIS — F410 Panic disorder [episodic paroxysmal anxiety] without agoraphobia: Secondary | ICD-10-CM

## 2018-01-04 MED ORDER — ALPRAZOLAM 1 MG PO TABS: 1.0000 mg | ORAL_TABLET | Freq: Three times a day (TID) | ORAL | 0 refills | Status: DC

## 2018-01-04 NOTE — ED Triage Notes (Signed)
[facility] generalized [facility]. Worried about his mortality, he also C/O anxiety while driving. Pt states he has seen PSY down stairs on 12/16/17 he has a follow up appointment in one month. He is taking medications daily. No thoughts of harming himself or anyone else.

## 2018-01-04 NOTE — Discharge Instructions (Signed)
Call your Psychiatrist on Monday.  Do not take clonazepam while taking xanax.   Take xanax only when needed for panic attack

## 2018-01-06 ENCOUNTER — Other Ambulatory Visit (HOSPITAL_COMMUNITY): Payer: Self-pay

## 2018-01-06 ENCOUNTER — Telehealth: Payer: Self-pay | Admitting: *Deleted

## 2018-01-06 ENCOUNTER — Telehealth (HOSPITAL_COMMUNITY): Payer: Self-pay

## 2018-01-06 MED ORDER — CLONAZEPAM 0.5 MG PO TABS: 0.5000 mg | ORAL_TABLET | Freq: Every day | ORAL | 0 refills | Status: DC | PRN

## 2018-01-06 MED ORDER — PAROXETINE HCL 30 MG PO TABS: ORAL_TABLET | ORAL | 0 refills | Status: DC

## 2018-01-06 NOTE — Telephone Encounter (Addendum)
Informed patient that Dr. Gilmore LarocheAkhtar suggest increasing Paxil to 30 mg. I also informed him to keep taking Clonazepam, in which I called a refill to the pharmacy. I also sent in a one month supply of Paxil 30 mg since his Paxil 20 mg were capsules instead of tablets. I instructed him to come to his appointment on 01/13/18. Patient stated his understanding. Nothing further is needed at this time.

## 2018-01-06 NOTE — Telephone Encounter (Signed)
Call back: LM to check pt's status and to call back if he has any questions or concerns. [facility]

## 2018-01-06 NOTE — Telephone Encounter (Signed)
Xanax can cause dependence.  Can increae paxil to 30mg  I.e. One tablet of 20mg  plus half more Continue klonopine low dose which is longer acting Follow with other recommendations as per visit

## 2018-01-06 NOTE — Telephone Encounter (Signed)
Patient states he went to Urgent Care last Friday because he was having a panic attack. They gave him enough Xanax for the weekend and told him to contact Dr. Gilmore LarocheAkhtar on Monday. He states that the Clonazepam is not working and has run out of it. Please review and advise.

## 2018-01-06 NOTE — ED Provider Notes (Signed)
[facility]    CSN: [id number] Arrival date & time: 01/04/18  1045     History   Chief Complaint Chief Complaint  Patient presents with  . Panic Attack    HPI Gabriel Gilbert is a 67 y.o. male.   The history is provided by the patient. No language interpreter was used.  Anxiety  This is a chronic problem. The problem occurs constantly. The problem has been gradually worsening. Pertinent negatives include no chest pain and no abdominal pain. Nothing aggravates the symptoms. Nothing relieves the symptoms. He has tried nothing for the symptoms. The treatment provided no relief.  Pt complains of severe anxiety.  Pt reports he had trouble getting to urgent care due to anxiety.  Pt has been taking clonazepam without relief.  Pt reports he is worried about dying.  Pt reports no support system Pt denies suicidal or homicidal thoughts. He is followed by behavioral health.   Past Medical History:  Diagnosis Date  . Bell's palsy 12-10  . Depression   . Diabetes mellitus    type 2  . Hyperlipidemia   . Hypertension     Patient Active Problem List   Diagnosis Date Noted  . Left cervical radiculopathy 04/28/2015  . Chondromalacia of right patellofemoral joint 04/28/2015  . OSA on CPAP 06/26/2013  . Major depressive disorder, recurrent episode, severe, without mention of psychotic behavior 04/21/2013  . Generalized anxiety disorder 04/21/2013  . Obesity 02/14/2012  . RLS (restless legs syndrome) 03/12/2011  . DERMATITIS, HANDS 08/04/2010  . BELL'S PALSY 10/26/2009  . CORTICAL SENILE CATARACT 05/09/2009  . LUMBAR RADICULOPATHY 11/26/2008  . Type 2 diabetes mellitus with periodontal complication (HCC) 03/13/2007  . INCONTINENCE, URGE 03/13/2007  . DEPRESSION, MAJOR, RECURRENT 08/20/2006  . TOBACCO DEPENDENCE 08/20/2006  . Essential hypertension, benign 08/20/2006  . KIDNEY STONE - NEPHROLITHIASIS 08/20/2006  . SLEEP APNEA 08/20/2006    Past Surgical History:    Procedure Laterality Date  . CATARACT EXTRACTION Left   . kidney stone removed    . pfts     normal       Home Medications    Prior to Admission medications   Medication Sig Start Date End Date Taking? Authorizing Provider  ALPRAZolam Prudy Feeler(XANAX) 1 MG tablet Take 1 tablet (1 mg total) by mouth 3 (three) times daily. 01/04/18   Elson AreasSofia, [place] Yeske K, PA-C  aspirin 81 MG tablet Take 81 mg by mouth daily.      [provider]  atorvastatin (LIPITOR) 40 MG tablet Take 1 tablet (40 mg total) by mouth at bedtime. 10/08/17   Agapito GamesMetheney, Catherine D, MD  B-D UF III MINI PEN NEEDLES 31G X 5 MM MISC USE ONE - DAILY 11/15/17   Agapito GamesMetheney, Catherine D, MD  buPROPion (WELLBUTRIN SR) 100 MG 12 hr tablet Take 1 tablet (100 mg total) by mouth daily. 12/16/17   Thresa RossAkhtar, Nadeem, MD  clonazePAM (KLONOPIN) 0.5 MG tablet Take 1 tablet (0.5 mg total) by mouth daily as needed for anxiety. 11/21/17   Thresa RossAkhtar, Nadeem, MD  empagliflozin (JARDIANCE) 10 MG TABS tablet Take 10 mg daily by mouth. 09/26/17   Agapito GamesMetheney, Catherine D, MD  glucose blood (ONE TOUCH TEST STRIPS) test strip Pt testing one time a day. 09/23/12   Agapito GamesMetheney, Catherine D, MD  Insulin Glargine (BASAGLAR KWIKPEN) 100 UNIT/ML SOPN INJECT 38 UNITS INTO SKIN EVERY NIGHT AT BEDTIME 11/25/17   Agapito GamesMetheney, Catherine D, MD  lisinopril (PRINIVIL,ZESTRIL) 10 MG tablet TAKE 1 TABLET BY MOUTH EVERY  DAY 07/08/17   [name], MD  metFORMIN (GLUCOPHAGE) 1000 MG tablet Take 1 tablet (1,000 mg total) 2 (two) times daily with a meal by mouth. 09/26/17   [name], MD  ONE TOUCH ULTRA TEST test strip TEST as directed 09/22/12   [name], MD  PARoxetine (PAXIL) 20 MG tablet One a day 11/21/17   Thresa Ross, MD  mirtazapine (REMERON) 15 MG tablet Take 15 mg by mouth daily. 09/12/17 11/21/17  [provider]    Family History Family History  Problem Relation Age of Onset  . COPD Mother   . Hypertension Mother   . Alzheimer's disease  Mother        ?  . Diabetes Father   . COPD Father   . Drug abuse Son   . COPD Sister   . Cancer Sister        lung    Social History Social History   Tobacco Use  . Smoking status: Current Every Day Smoker    Packs/day: 1.00    Years: 25.00    Pack years: 25.00    Types: E-cigarettes    Last attempt to quit: 02/10/2013    Years since quitting: 4.9  . Smokeless tobacco: Never Used  . Tobacco comment: using electronic cigarettes  Substance Use Topics  . Alcohol use: No    Alcohol/week: 0.0 oz  . Drug use: No     Allergies   Patient has no known allergies.   Review of Systems Review of Systems  Cardiovascular: Negative for chest pain.  Gastrointestinal: Negative for abdominal pain.  Psychiatric/Behavioral: Positive for agitation. The patient is nervous/anxious.   All other systems reviewed and are negative.    Physical Exam Triage Vital Signs ED Triage Vitals  Enc Vitals Group     BP 01/04/18 1132 (!) 165/89     Pulse Rate 01/04/18 1132 74     Resp 01/04/18 1132 16     Temp 01/04/18 1132 98 F (36.7 C)     Temp Source 01/04/18 1132 Oral     SpO2 01/04/18 1132 95 %     Weight 01/04/18 1133 195 lb (88.5 kg)     Height 01/04/18 1133 5\' 9"  (1.753 m)     Head Circumference --      Peak Flow --      Pain Score --      Pain Loc --      Pain Edu? --      Excl. in GC? --    No data found.  Updated Vital Signs BP (!) 165/89 (BP Location: Right Arm)   Pulse 74   Temp 98 F (36.7 C) (Oral)   Resp 16   Ht 5\' 9"  (1.753 m)   Wt 195 lb (88.5 kg)   SpO2 95%   BMI 28.80 kg/m   Visual Acuity Right Eye Distance:   Left Eye Distance:   Bilateral Distance:    Right Eye Near:   Left Eye Near:    Bilateral Near:     Physical Exam  Constitutional: He appears well-developed and well-nourished.  HENT:  Head: Normocephalic and atraumatic.  Eyes: Conjunctivae are normal.  Neck: Neck supple.  Cardiovascular: Normal rate and regular rhythm.  No murmur  heard. Pulmonary/Chest: Effort normal and breath sounds normal. No respiratory distress.  Abdominal: There is no tenderness.  Musculoskeletal: He exhibits no edema.  Neurological: He is alert.  Skin: Skin is warm and dry.  Psychiatric:  Anxoius, pacing,   Nursing note and vitals reviewed.    UC Treatments / Results  Labs (all labs ordered are listed, but only abnormal results are displayed) Labs Reviewed - No data to display  EKG  EKG Interpretation None       Radiology No results found.  Procedures Procedures (including critical care time)  Medications Ordered in UC Medications - No data to display   Initial Impression / Assessment and Plan / UC Course  I have reviewed the triage vital signs and the nursing notes.  Pertinent labs & imaging results that were available during my care of the patient were reviewed by me and considered in my medical decision making (see chart for details).     MDM:  Pt is very anxious,  I do not think he would meet criteria for involuntary commitment.  He is aware he can got to hospital if needed.  Pt ask for xanax as this has helped in the past.  I reviewed [place]  database.  Pt  Receives Clonopin but prescription has expired.  I will give pt limited rx for xanax.  He is advised to call behavioral health to be seen during the week.  Final Clinical Impressions(s) / UC Diagnoses   Final diagnoses:  Panic attack    ED Discharge Orders        Ordered    ALPRAZolam (XANAX) 1 MG tablet  3 times daily     01/04/18 1209       Controlled Substance Prescriptions [place] Controlled Substance Registry consulted? Yes, I have consulted the [place] Controlled Substances Registry for this patient, and feel the risk/benefit ratio today is favorable for proceeding with this prescription for a controlled substance.   Elson Areas, [place] 01/06/18 [phone]

## 2018-01-13 ENCOUNTER — Encounter (HOSPITAL_COMMUNITY): Payer: Self-pay | Admitting: Psychiatry

## 2018-01-13 ENCOUNTER — Ambulatory Visit (INDEPENDENT_AMBULATORY_CARE_PROVIDER_SITE_OTHER): Payer: Medicare HMO | Admitting: Psychiatry

## 2018-01-13 VITALS — BP 130/90 | HR 76 | Ht 69.0 in | Wt 186.0 lb

## 2018-01-13 DIAGNOSIS — F410 Panic disorder [episodic paroxysmal anxiety] without agoraphobia: Secondary | ICD-10-CM

## 2018-01-13 DIAGNOSIS — F331 Major depressive disorder, recurrent, moderate: Secondary | ICD-10-CM

## 2018-01-13 DIAGNOSIS — F411 Generalized anxiety disorder: Secondary | ICD-10-CM

## 2018-01-13 DIAGNOSIS — F17290 Nicotine dependence, other tobacco product, uncomplicated: Secondary | ICD-10-CM

## 2018-01-13 DIAGNOSIS — Z813 Family history of other psychoactive substance abuse and dependence: Secondary | ICD-10-CM | POA: Diagnosis not present

## 2018-01-13 DIAGNOSIS — F17208 Nicotine dependence, unspecified, with other nicotine-induced disorders: Secondary | ICD-10-CM | POA: Diagnosis not present

## 2018-01-13 DIAGNOSIS — R69 Illness, unspecified: Secondary | ICD-10-CM | POA: Diagnosis not present

## 2018-01-13 DIAGNOSIS — Z818 Family history of other mental and behavioral disorders: Secondary | ICD-10-CM

## 2018-01-13 MED ORDER — PAROXETINE HCL 30 MG PO TABS: ORAL_TABLET | ORAL | 0 refills | Status: DC

## 2018-01-13 MED ORDER — ALPRAZOLAM 1 MG PO TABS: 1.0000 mg | ORAL_TABLET | Freq: Every day | ORAL | 0 refills | Status: DC | PRN

## 2018-01-13 MED ORDER — BUPROPION HCL ER (SR) 100 MG PO TB12: 100.0000 mg | ORAL_TABLET | Freq: Every day | ORAL | 0 refills | Status: DC

## 2018-01-13 NOTE — Progress Notes (Signed)
[facility] Outpatient Follow up visit   Patient Identification: Gabriel Gilbert MRN:  [id number] Date of Evaluation:  01/13/2018 Referral Source: primary care.  Chief Complaint:   Chief Complaint    Follow-up; Other     Visit Diagnosis:  No diagnosis found.  History of Present Illness:  67 years old single white male. Referred for management of depression  Has seen Dr. Randa Evens psychiatrist at [facility]. He is not taking medicare so change of provider.   wellbutrin added last visit. Still subdued.  In bettween had panic attack at night. Reported to urgent care got xanax. That helped. stil taking klonopine Says xanax onlyhelps in urgent cases as otherwise he starts thinking of death  Continues to smoke paxil was increased on telephone to 30mg . Has helped some as well    Tries to go [facility] visit friends otherwise feel lonely Not have cut down smoking. Also not nutritionally good diet and drinking less water  difficult to change habits  Aggravating factor: lonliness. May file banckruptcy ,modifying factor: dog  Severity of depression: 6/10 Duration since age 84's Timing more in morning    Past Psychiatric History: depression ,anxiety  Previous Psychotropic Medications: Yes   Substance Abuse History in the last 12 months:  No.  Consequences of Substance Abuse: NA  Past Medical History:  Past Medical History:  Diagnosis Date  . Bell's palsy 12-10  . Depression   . Diabetes mellitus    type 2  . Hyperlipidemia   . Hypertension     Past Surgical History:  Procedure Laterality Date  . CATARACT EXTRACTION Left   . kidney stone removed    . pfts     normal    Family Psychiatric History: anxiety. Mom used valium  Family History:  Family History  Problem Relation Age of Onset  . COPD Mother   . Hypertension Mother   . Alzheimer's disease Mother        ?  . Diabetes Father   . COPD Father   . Drug abuse Son   . COPD Sister   . Cancer Sister        lung     Social History:   Social History   Socioeconomic History  . Marital status: Single    Spouse name: None  . Number of children: None  . Years of education: None  . Highest education level: None  Social Needs  . Financial resource strain: None  . Food insecurity - worry: None  . Food insecurity - inability: None  . Transportation needs - medical: None  . Transportation needs - non-medical: None  Occupational History  . None  Tobacco Use  . Smoking status: Current Every Day Smoker    Packs/day: 1.00    Years: 25.00    Pack years: 25.00    Types: E-cigarettes    Last attempt to quit: 02/10/2013    Years since quitting: 4.9  . Smokeless tobacco: Never Used  . Tobacco comment: using electronic cigarettes  Substance and Sexual Activity  . Alcohol use: No    Alcohol/week: 0.0 oz  . Drug use: No  . Sexual activity: No    Partners: Female  Other Topics Concern  . None  Social History Narrative  . None      Allergies:  No Known Allergies  Metabolic Disorder Labs: Lab Results  Component Value Date   HGBA1C 6.6 10/07/2017   No results found for: PROLACTIN Lab Results  Component Value Date   CHOL  154 06/06/2017   TRIG 315 (H) 06/06/2017   HDL 27 (L) 06/06/2017   CHOLHDL 5.7 (H) 06/06/2017   VLDL 22 01/17/2016   LDLCALC 79 01/17/2016   LDLCALC 81 01/26/2015     Current Medications: Current Outpatient Medications  Medication Sig Dispense Refill  . ALPRAZolam (XANAX) 1 MG tablet Take 1 tablet (1 mg total) by mouth daily as needed for anxiety. 10 tablet 0  . aspirin 81 MG tablet Take 81 mg by mouth daily.      Marland Kitchen. atorvastatin (LIPITOR) 40 MG tablet Take 1 tablet (40 mg total) by mouth at bedtime. 90 tablet 3  . B-D UF III MINI PEN NEEDLES 31G X 5 MM MISC USE ONE - DAILY 100 each 0  . buPROPion (WELLBUTRIN SR) 100 MG 12 hr tablet Take 1 tablet (100 mg total) by mouth daily. 30 tablet 0  . clonazePAM (KLONOPIN) 0.5 MG tablet Take 1 tablet (0.5 mg total) by mouth  daily as needed for anxiety. 30 tablet 0  . empagliflozin (JARDIANCE) 10 MG TABS tablet Take 10 mg daily by mouth. 30 tablet 5  . glucose blood (ONE TOUCH TEST STRIPS) test strip Pt testing one time a day. 100 each 3  . Insulin Glargine (BASAGLAR KWIKPEN) 100 UNIT/ML SOPN INJECT 38 UNITS INTO SKIN EVERY NIGHT AT BEDTIME 15 mL 5  . lisinopril (PRINIVIL,ZESTRIL) 10 MG tablet TAKE 1 TABLET BY MOUTH EVERY DAY 90 tablet 1  . metFORMIN (GLUCOPHAGE) 1000 MG tablet Take 1 tablet (1,000 mg total) 2 (two) times daily with a meal by mouth. 180 tablet 3  . ONE TOUCH ULTRA TEST test strip TEST as directed 100 each 3  . PARoxetine (PAXIL) 30 MG tablet One a day 30 tablet 0   No current facility-administered medications for this visit.       Psychiatric Specialty Exam: Review of Systems  Cardiovascular: Negative for palpitations.  Skin: Negative for rash.  Psychiatric/Behavioral: Positive for depression. Negative for suicidal ideas.    Blood pressure 130/90, pulse 76, height 5\' 9"  (1.753 m), weight 186 lb (84.4 kg).Body mass index is 27.47 kg/m.  General Appearance: Casual  Eye Contact:  Fair  Speech:  Slow  Volume:  Decreased  Mood: subdued  Affect:  congruent  Thought Process:  Goal Directed  Orientation:  Full (Time, Place, and Person)  Thought Content:  Rumination  Suicidal Thoughts:  No  Homicidal Thoughts:  No  Memory:  Immediate;   Fair Recent;   Fair  Judgement:  Poor  Insight:  Shallow  Psychomotor Activity:  Decreased  Concentration:  Concentration: Fair and Attention Span: Fair  Recall:  [employer]Fair  Fund of Knowledge:Fair  Language: Fair  Akathisia:  Negative  Handed:  Right  AIMS (if indicated):  0  Assets:  Desire for Improvement  ADL's:  Intact  Cognition: WNL  Sleep:  fair    Treatment Plan Summary: Medication management and Plan as follows   1. Major depression moderate recurrent: subdued. Not worse. Continue paxil now 30 and wellbutrin 2. GAD: fluctuates. Continue  paxil Requesting prn xanax. Will give 10 tablets for panic attacks 3. Panic attacks: infrequent but only xanax helps that time. Will give 10 . Can carry and not take everdya 4. Nicotine dependence: now taking e cig. Working on cutting down  Fu 4 weeks.  Also doing therapy will cancel appointment with Corrie DandyMary here Reviewed meds. Sent refills    Thresa RossNadeem Vicy Medico, MD 3/4/20193:14 PM

## 2018-01-25 ENCOUNTER — Other Ambulatory Visit (HOSPITAL_COMMUNITY): Payer: Self-pay | Admitting: Psychiatry

## 2018-01-28 ENCOUNTER — Telehealth (HOSPITAL_COMMUNITY): Payer: Self-pay

## 2018-01-28 MED ORDER — ALPRAZOLAM 1 MG PO TABS: 1.0000 mg | ORAL_TABLET | Freq: Every day | ORAL | 0 refills | Status: DC | PRN

## 2018-01-28 NOTE — Telephone Encounter (Signed)
Patient states he has been taking 1/2 tab of Alprazolam at night and he only has 4 left. He wants to know if Dr. Gilmore LarocheAkhtar is willing to refill medication. Please review and advise.

## 2018-01-28 NOTE — Telephone Encounter (Signed)
Can send 10 tablets of xanax refill. Take half only if needed

## 2018-01-28 NOTE — Telephone Encounter (Signed)
Per Dr. Gilmore LarocheAkhtar I called in 10 tablets of Alprazolam 1 mg to pharmacy. Informed patient of refill. Nothing further is needed at this time.

## 2018-02-04 ENCOUNTER — Ambulatory Visit (INDEPENDENT_AMBULATORY_CARE_PROVIDER_SITE_OTHER): Payer: Medicare HMO | Admitting: Family Medicine

## 2018-02-04 ENCOUNTER — Encounter: Payer: Self-pay | Admitting: Family Medicine

## 2018-02-04 VITALS — BP 120/53 | HR 63 | Ht 69.0 in | Wt 184.0 lb

## 2018-02-04 DIAGNOSIS — Z1211 Encounter for screening for malignant neoplasm of colon: Secondary | ICD-10-CM | POA: Diagnosis not present

## 2018-02-04 DIAGNOSIS — Z23 Encounter for immunization: Secondary | ICD-10-CM | POA: Diagnosis not present

## 2018-02-04 DIAGNOSIS — I10 Essential (primary) hypertension: Secondary | ICD-10-CM

## 2018-02-04 DIAGNOSIS — E11630 Type 2 diabetes mellitus with periodontal disease: Secondary | ICD-10-CM

## 2018-02-04 DIAGNOSIS — Z794 Long term (current) use of insulin: Secondary | ICD-10-CM | POA: Diagnosis not present

## 2018-02-04 LAB — POCT GLYCOSYLATED HEMOGLOBIN (HGB A1C): Hemoglobin A1C: 6.2

## 2018-02-04 NOTE — Progress Notes (Signed)
Subjective:    CC: BP, DM  HPI:  Hypertension- Pt denies chest pain, SOB, dizziness, or heart palpitations.  Taking meds as directed w/o problems.  Denies medication side effects.    Diabetes - no hypoglycemic events. No wounds or sores that are not healing well. No increased thirst or urination. Checking glucose at home. Taking medications as prescribed without any side effects.  He has been going to the wife because he does have silver sneakers.  He is also been taking an over-the-counter supplement specifically for diabetes.  He does not remember the name of it but he thinks that has been helping as well.  Is overdue for colon cancer screening.  Last colonoscopy was in 2007.  Past medical history, Surgical history, Family history not pertinant except as noted below, Social history, Allergies, and medications have been entered into the medical record, reviewed, and corrections made.   Review of Systems: No fevers, chills, night sweats, weight loss, chest pain, or shortness of breath.   Objective:    General: Well Developed, well nourished, and in no acute distress.  Neuro: Alert and oriented x3, extra-ocular muscles intact, sensation grossly intact.  HEENT: Normocephalic, atraumatic  Skin: Warm and dry, no rashes. Cardiac: Regular rate and rhythm, no murmurs rubs or gallops, no lower extremity edema.  Respiratory: Clear to auscultation bilaterally. Not using accessory muscles, speaking in full sentences.   Impression and Recommendations:    HTN - Well controlled. Continue current regimen. Follow up in  4 months.    DM - well controlled with an A1C of 6.2.   On ACE, ASA and statin.   Colon cancer screening-discussed options.  Declines colonoscopy but he is willing to do stool cards.  Cards provided.  Prevnar 13 given today.

## 2018-02-05 LAB — BASIC METABOLIC PANEL WITH GFR
BUN: 22 mg/dL (ref 7–25)
CALCIUM: 9.9 mg/dL (ref 8.6–10.3)
CO2: 26 mmol/L (ref 20–32)
CREATININE: 0.97 mg/dL (ref 0.70–1.25)
Chloride: 103 mmol/L (ref 98–110)
GFR, EST NON AFRICAN AMERICAN: 82 mL/min/{1.73_m2} (ref >=60)
GFR, Est African American: 95 mL/min/{1.73_m2} (ref >=60)
Glucose, Bld: 120 mg/dL — ABNORMAL HIGH (ref 65–99)
Potassium: 4.5 mmol/L (ref 3.5–5.3)
Sodium: 138 mmol/L (ref 135–146)

## 2018-02-05 NOTE — Progress Notes (Signed)
All labs are normal. 

## 2018-02-08 ENCOUNTER — Other Ambulatory Visit (HOSPITAL_COMMUNITY): Payer: Self-pay | Admitting: Psychiatry

## 2018-02-11 ENCOUNTER — Other Ambulatory Visit (HOSPITAL_COMMUNITY): Payer: Self-pay | Admitting: Psychiatry

## 2018-02-11 MED ORDER — CLONAZEPAM 0.5 MG PO TABS: 0.5000 mg | ORAL_TABLET | Freq: Every day | ORAL | 0 refills | Status: DC | PRN

## 2018-02-11 NOTE — Telephone Encounter (Signed)
Pt needs refill on klonopin [facility] to [facility].  CB # [phone]

## 2018-02-11 NOTE — Telephone Encounter (Signed)
Can refill if due

## 2018-02-11 NOTE — Telephone Encounter (Signed)
Called in medication to pharmacy and informed patient. Nothing further is needed at this time.

## 2018-02-13 ENCOUNTER — Encounter (HOSPITAL_COMMUNITY): Payer: Self-pay | Admitting: Psychiatry

## 2018-02-13 ENCOUNTER — Other Ambulatory Visit: Payer: Self-pay

## 2018-02-13 ENCOUNTER — Ambulatory Visit (INDEPENDENT_AMBULATORY_CARE_PROVIDER_SITE_OTHER): Payer: Medicare HMO | Admitting: Psychiatry

## 2018-02-13 VITALS — BP 140/78 | HR 79 | Ht 69.0 in | Wt 184.0 lb

## 2018-02-13 DIAGNOSIS — F410 Panic disorder [episodic paroxysmal anxiety] without agoraphobia: Secondary | ICD-10-CM | POA: Diagnosis not present

## 2018-02-13 DIAGNOSIS — Z818 Family history of other mental and behavioral disorders: Secondary | ICD-10-CM

## 2018-02-13 DIAGNOSIS — F411 Generalized anxiety disorder: Secondary | ICD-10-CM | POA: Diagnosis not present

## 2018-02-13 DIAGNOSIS — Z813 Family history of other psychoactive substance abuse and dependence: Secondary | ICD-10-CM | POA: Diagnosis not present

## 2018-02-13 DIAGNOSIS — F17208 Nicotine dependence, unspecified, with other nicotine-induced disorders: Secondary | ICD-10-CM

## 2018-02-13 DIAGNOSIS — R69 Illness, unspecified: Secondary | ICD-10-CM | POA: Diagnosis not present

## 2018-02-13 DIAGNOSIS — F331 Major depressive disorder, recurrent, moderate: Secondary | ICD-10-CM | POA: Diagnosis not present

## 2018-02-13 DIAGNOSIS — F17290 Nicotine dependence, other tobacco product, uncomplicated: Secondary | ICD-10-CM | POA: Diagnosis not present

## 2018-02-13 DIAGNOSIS — Z810 Family history of intellectual disabilities: Secondary | ICD-10-CM

## 2018-02-13 MED ORDER — BUPROPION HCL ER (SR) 150 MG PO TB12: 150.0000 mg | ORAL_TABLET | Freq: Every day | ORAL | 1 refills | Status: DC

## 2018-02-13 MED ORDER — PAROXETINE HCL 30 MG PO TABS: ORAL_TABLET | ORAL | 1 refills | Status: DC

## 2018-02-13 NOTE — Progress Notes (Signed)
[facility] Outpatient Follow up visit   Patient Identification: Gabriel Gilbert MRN:  [id number] Date of Evaluation:  02/13/2018 Referral Source: primary care.  Chief Complaint:   Chief Complaint    Follow-up; Other     Visit Diagnosis:    ICD-10-CM   1. Major depressive disorder, recurrent episode, moderate (HCC) F33.1   2. GAD (generalized anxiety disorder) F41.1   3. Panic disorder F41.0   4. Nicotine dependence with other nicotine-induced disorder, unspecified nicotine product type F17.208     History of Present Illness:  67 years old single white male. Referred for management of depression  Has seen Dr. Randa Evens psychiatrist at [facility]. He is not taking medicare so change of provider.    Goes to [facility] twice a day. Still smoking. wellbutrin helped some but remains negative about life and decreased energy Has to carry xanax for possible panic attacks and takes klonopine regularly   Also not nutritionally good diet and drinking less water  difficult to change habits  Aggravating factor: lonliness  May file banckruptcy ,modifying factor: dog  Severity of depression: 5.5/10 Duration since age 76's Timing more in morning    Past Psychiatric History: depression ,anxiety  Previous Psychotropic Medications: Yes   Substance Abuse History in the last 12 months:  No.  Consequences of Substance Abuse: NA  Past Medical History:  Past Medical History:  Diagnosis Date  . Bell's palsy 12-10  . Depression   . Diabetes mellitus    type 2  . Hyperlipidemia   . Hypertension     Past Surgical History:  Procedure Laterality Date  . CATARACT EXTRACTION Left   . kidney stone removed    . pfts     normal    Family Psychiatric History: anxiety. Mom used valium  Family History:  Family History  Problem Relation Age of Onset  . COPD Mother   . Hypertension Mother   . Alzheimer's disease Mother        ?  . Diabetes Father   . COPD Father   . Drug abuse Son   . COPD  Sister   . Cancer Sister        lung    Social History:   Social History   Socioeconomic History  . Marital status: Single    Spouse name: Not on file  . Number of children: Not on file  . Years of education: Not on file  . Highest education level: Not on file  Occupational History  . Not on file  Social Needs  . Financial resource strain: Not on file  . Food insecurity:    Worry: Not on file    Inability: Not on file  . Transportation needs:    Medical: Not on file    Non-medical: Not on file  Tobacco Use  . Smoking status: Current Every Day Smoker    Packs/day: 1.00    Years: 25.00    Pack years: 25.00    Types: E-cigarettes    Last attempt to quit: 02/10/2013    Years since quitting: 5.0  . Smokeless tobacco: Never Used  . Tobacco comment: using electronic cigarettes  Substance and Sexual Activity  . Alcohol use: No    Alcohol/week: 0.0 oz  . Drug use: No  . Sexual activity: Never    Partners: Female  Lifestyle  . Physical activity:    Days per week: Not on file    Minutes per session: Not on file  . Stress: Not on  file  Relationships  . Social connections:    Talks on phone: Not on file    Gets together: Not on file    Attends religious service: Not on file    Active member of club or organization: Not on file    Attends meetings of clubs or organizations: Not on file    Relationship status: Not on file  Other Topics Concern  . Not on file  Social History Narrative  . Not on file      Allergies:  No Known Allergies  Metabolic Disorder Labs: Lab Results  Component Value Date   HGBA1C 6.2 02/04/2018   No results found for: PROLACTIN Lab Results  Component Value Date   CHOL 154 06/06/2017   TRIG 315 (H) 06/06/2017   HDL 27 (L) 06/06/2017   CHOLHDL 5.7 (H) 06/06/2017   VLDL 22 01/17/2016   LDLCALC 79 01/17/2016   LDLCALC 81 01/26/2015     Current Medications: Current Outpatient Medications  Medication Sig Dispense Refill  . ALPRAZolam  (XANAX) 1 MG tablet Take 1 tablet (1 mg total) by mouth daily as needed for anxiety. 10 tablet 0  . aspirin 81 MG tablet Take 81 mg by mouth daily.      Marland Kitchen atorvastatin (LIPITOR) 40 MG tablet Take 1 tablet (40 mg total) by mouth at bedtime. 90 tablet 3  . B-D UF III MINI PEN NEEDLES 31G X 5 MM MISC USE ONE - DAILY 100 each 0  . buPROPion (WELLBUTRIN SR) 150 MG 12 hr tablet Take 1 tablet (150 mg total) by mouth daily. 30 tablet 1  . clonazePAM (KLONOPIN) 0.5 MG tablet Take 1 tablet (0.5 mg total) by mouth daily as needed for anxiety. 30 tablet 0  . empagliflozin (JARDIANCE) 10 MG TABS tablet Take 10 mg daily by mouth. 30 tablet 5  . glucose blood (ONE TOUCH TEST STRIPS) test strip Pt testing one time a day. 100 each 3  . Insulin Glargine (BASAGLAR KWIKPEN) 100 UNIT/ML SOPN INJECT 38 UNITS INTO SKIN EVERY NIGHT AT BEDTIME 15 mL 5  . lisinopril (PRINIVIL,ZESTRIL) 10 MG tablet TAKE 1 TABLET BY MOUTH EVERY DAY 90 tablet 1  . metFORMIN (GLUCOPHAGE) 1000 MG tablet Take 1 tablet (1,000 mg total) 2 (two) times daily with a meal by mouth. 180 tablet 3  . ONE TOUCH ULTRA TEST test strip TEST as directed 100 each 3  . PARoxetine (PAXIL) 30 MG tablet One a day 30 tablet 1   No current facility-administered medications for this visit.       Psychiatric Specialty Exam: Review of Systems  Cardiovascular: Negative for chest pain.  Skin: Negative for rash.  Psychiatric/Behavioral: Positive for depression. Negative for suicidal ideas.    Blood pressure 140/78, pulse 79, height 5\' 9"  (1.753 m), weight 184 lb (83.5 kg), SpO2 91 %.Body mass index is 27.17 kg/m.  General Appearance: Casual  Eye Contact:  Fair  Speech:  Slow  Volume:  Decreased  Mood: subdued  Affect:  congruent  Thought Process:  Goal Directed  Orientation:  Full (Time, Place, and Person)  Thought Content:  Rumination  Suicidal Thoughts:  No  Homicidal Thoughts:  No  Memory:  Immediate;   Fair Recent;   Fair  Judgement:  Poor   Insight:  Shallow  Psychomotor Activity:  Decreased  Concentration:  Concentration: Fair and Attention Span: Fair  Recall:  [employer] of Knowledge:Fair  Language: Fair  Akathisia:  Negative  Handed:  Right  AIMS (if  indicated):  0  Assets:  Desire for Improvement  ADL's:  Intact  Cognition: WNL  Sleep:  fair    Treatment Plan Summary: Medication management and Plan as follows   1. Major depression moderate recurrent: subdued. Increase wellbutrin to 150mg  continue paxil work on keeping positive  2. GAD: fluctuates. Continue paxil and klonopine  3. Panic attacks:infrequent  Has to carry xanax 4. Nicotine dependence: now taking e cig. Working on cutting down  Fu 2 m. Renew meds call for early appointment'  Thresa RossNadeem Aleeah Greeno, MD 4/4/20192:47 PM

## 2018-02-16 ENCOUNTER — Other Ambulatory Visit (HOSPITAL_COMMUNITY): Payer: Self-pay | Admitting: Psychiatry

## 2018-02-16 ENCOUNTER — Other Ambulatory Visit: Payer: Self-pay | Admitting: Family Medicine

## 2018-02-19 ENCOUNTER — Telehealth (HOSPITAL_COMMUNITY): Payer: Self-pay

## 2018-02-19 NOTE — Telephone Encounter (Signed)
Patient wants to know if Dr. Gilmore LarocheAkhtar will refill Xanax. Last refill was on 01/28/18 for 10 pills. Patient last seen on 02/13/18. Please review and advise.

## 2018-02-20 ENCOUNTER — Other Ambulatory Visit: Payer: Self-pay | Admitting: *Deleted

## 2018-02-20 DIAGNOSIS — Z1211 Encounter for screening for malignant neoplasm of colon: Secondary | ICD-10-CM

## 2018-02-20 LAB — POC HEMOCCULT BLD/STL (HOME/3-CARD/SCREEN)
Card #2 Fecal Occult Blod, POC: NEGATIVE
Card #3 Fecal Occult Blood, POC: NEGATIVE
Fecal Occult Blood, POC: NEGATIVE

## 2018-02-20 MED ORDER — ALPRAZOLAM 1 MG PO TABS: 1.0000 mg | ORAL_TABLET | Freq: Every day | ORAL | 0 refills | Status: DC | PRN

## 2018-02-20 NOTE — Telephone Encounter (Signed)
Can refill 10 pills. Please send

## 2018-02-20 NOTE — Telephone Encounter (Signed)
Per Dr. Gilmore LarocheAkhtar, I called in 10 tablets of Alprazolam 1mg  to the pharmacy and I informed the patient of the refill. Nothing further is needed at this time.

## 2018-02-24 ENCOUNTER — Ambulatory Visit (HOSPITAL_COMMUNITY): Payer: Self-pay | Admitting: Licensed Clinical Social Worker

## 2018-02-28 ENCOUNTER — Other Ambulatory Visit: Payer: Self-pay | Admitting: Family Medicine

## 2018-03-10 ENCOUNTER — Telehealth (HOSPITAL_COMMUNITY): Payer: Self-pay | Admitting: Psychiatry

## 2018-03-10 MED ORDER — ALPRAZOLAM 1 MG PO TABS: 1.0000 mg | ORAL_TABLET | Freq: Every day | ORAL | 0 refills | Status: DC | PRN

## 2018-03-10 NOTE — Telephone Encounter (Signed)
Pt needs refill on xanax, sent to [facility]

## 2018-03-10 NOTE — Telephone Encounter (Signed)
Sent 10 tablets. Please tell patient not to use regularly and take half at times. Will not refill for another month now

## 2018-03-11 NOTE — Telephone Encounter (Signed)
Informed patient per Dr. Gilmore Laroche that he should not be taking Xanax regularly and he should take 1/2 tab because medication will not be refilled for another month. Patient stated his understanding.

## 2018-03-14 ENCOUNTER — Other Ambulatory Visit (HOSPITAL_COMMUNITY): Payer: Self-pay

## 2018-03-14 MED ORDER — CLONAZEPAM 0.5 MG PO TABS: 0.5000 mg | ORAL_TABLET | Freq: Every day | ORAL | 0 refills | Status: DC | PRN

## 2018-04-09 ENCOUNTER — Telehealth (HOSPITAL_COMMUNITY): Payer: Self-pay

## 2018-04-09 DIAGNOSIS — R69 Illness, unspecified: Secondary | ICD-10-CM | POA: Diagnosis not present

## 2018-04-09 DIAGNOSIS — G4733 Obstructive sleep apnea (adult) (pediatric): Secondary | ICD-10-CM | POA: Diagnosis not present

## 2018-04-09 DIAGNOSIS — Z9989 Dependence on other enabling machines and devices: Secondary | ICD-10-CM | POA: Diagnosis not present

## 2018-04-09 MED ORDER — ALPRAZOLAM 1 MG PO TABS: 1.0000 mg | ORAL_TABLET | Freq: Every day | ORAL | 0 refills | Status: DC | PRN

## 2018-04-09 NOTE — Telephone Encounter (Signed)
Refill sent.

## 2018-04-09 NOTE — Telephone Encounter (Signed)
Left a vm and informed patient of refills sent to the pharmacy.

## 2018-04-09 NOTE — Telephone Encounter (Signed)
Refill on Xanax sent to [facility]. Last refill was on 03/10/18

## 2018-04-13 ENCOUNTER — Other Ambulatory Visit (HOSPITAL_COMMUNITY): Payer: Self-pay | Admitting: Psychiatry

## 2018-04-15 DIAGNOSIS — Z9989 Dependence on other enabling machines and devices: Secondary | ICD-10-CM | POA: Diagnosis not present

## 2018-04-15 DIAGNOSIS — G4733 Obstructive sleep apnea (adult) (pediatric): Secondary | ICD-10-CM | POA: Diagnosis not present

## 2018-04-17 DIAGNOSIS — Z87891 Personal history of nicotine dependence: Secondary | ICD-10-CM | POA: Diagnosis not present

## 2018-04-17 DIAGNOSIS — R69 Illness, unspecified: Secondary | ICD-10-CM | POA: Diagnosis not present

## 2018-04-25 ENCOUNTER — Ambulatory Visit (INDEPENDENT_AMBULATORY_CARE_PROVIDER_SITE_OTHER): Payer: Medicare HMO | Admitting: Psychiatry

## 2018-04-25 ENCOUNTER — Encounter (HOSPITAL_COMMUNITY): Payer: Self-pay | Admitting: Psychiatry

## 2018-04-25 ENCOUNTER — Other Ambulatory Visit: Payer: Self-pay

## 2018-04-25 VITALS — BP 122/78 | HR 67 | Ht 69.0 in | Wt 188.0 lb

## 2018-04-25 DIAGNOSIS — R69 Illness, unspecified: Secondary | ICD-10-CM | POA: Diagnosis not present

## 2018-04-25 DIAGNOSIS — F411 Generalized anxiety disorder: Secondary | ICD-10-CM

## 2018-04-25 DIAGNOSIS — F331 Major depressive disorder, recurrent, moderate: Secondary | ICD-10-CM | POA: Diagnosis not present

## 2018-04-25 DIAGNOSIS — F410 Panic disorder [episodic paroxysmal anxiety] without agoraphobia: Secondary | ICD-10-CM | POA: Diagnosis not present

## 2018-04-25 MED ORDER — PAROXETINE HCL 30 MG PO TABS: ORAL_TABLET | ORAL | 1 refills | Status: DC

## 2018-04-25 NOTE — Progress Notes (Signed)
[facility] Outpatient Follow up visit   Patient Identification: Gabriel Gilbert MRN:  [id number] Date of Evaluation:  04/25/2018 Referral Source: primary care.  Chief Complaint:   Chief Complaint    Follow-up; Other     Visit Diagnosis:    ICD-10-CM   1. Major depressive disorder, recurrent episode, moderate (HCC) F33.1   2. GAD (generalized anxiety disorder) F41.1   3. Panic disorder F41.0     History of Present Illness:  67 years old single white male. Referred for management of depression  Doing fair. Gets subdued but goes to [employer] daily to meet friends. Prn xanax helps stress and anxiety otherwise takes paxil and wellbutrin Still smokes cutting it down Mood not worse  Has applied bankrupcy  Aggravating factor: lonliness  ,modifying factor: dog  Severity of depression: not worse. Duration since age 33's Timing more in morning    Past Psychiatric History: depression ,anxiety  Previous Psychotropic Medications: Yes   Substance Abuse History in the last 12 months:  No.  Consequences of Substance Abuse: NA  Past Medical History:  Past Medical History:  Diagnosis Date  . Bell's palsy 12-10  . Depression   . Diabetes mellitus    type 2  . Hyperlipidemia   . Hypertension     Past Surgical History:  Procedure Laterality Date  . CATARACT EXTRACTION Left   . kidney stone removed    . pfts     normal    Family Psychiatric History: anxiety. Mom used valium  Family History:  Family History  Problem Relation Age of Onset  . COPD Mother   . Hypertension Mother   . Alzheimer's disease Mother        ?  . Diabetes Father   . COPD Father   . Drug abuse Son   . COPD Sister   . Cancer Sister        lung    Social History:   Social History   Socioeconomic History  . Marital status: Single    Spouse name: Not on file  . Number of children: Not on file  . Years of education: Not on file  . Highest education level: Not on file  Occupational History  .  Not on file  Social Needs  . Financial resource strain: Not on file  . Food insecurity:    Worry: Not on file    Inability: Not on file  . Transportation needs:    Medical: Not on file    Non-medical: Not on file  Tobacco Use  . Smoking status: Current Every Day Smoker    Packs/day: 0.25    Years: 25.00    Pack years: 6.25    Types: E-cigarettes    Last attempt to quit: 02/10/2013    Years since quitting: 5.2  . Smokeless tobacco: Never Used  . Tobacco comment: using electronic cigarettes  Substance and Sexual Activity  . Alcohol use: No    Alcohol/week: 0.0 oz  . Drug use: No  . Sexual activity: Never    Partners: Female  Lifestyle  . Physical activity:    Days per week: Not on file    Minutes per session: Not on file  . Stress: Not on file  Relationships  . Social connections:    Talks on phone: Not on file    Gets together: Not on file    Attends religious service: Not on file    Active member of club or organization: Not on file  Attends meetings of clubs or organizations: Not on file    Relationship status: Not on file  Other Topics Concern  . Not on file  Social History Narrative  . Not on file      Allergies:  No Known Allergies  Metabolic Disorder Labs: Lab Results  Component Value Date   HGBA1C 6.2 02/04/2018   No results found for: PROLACTIN Lab Results  Component Value Date   CHOL 154 06/06/2017   TRIG 315 (H) 06/06/2017   HDL 27 (L) 06/06/2017   CHOLHDL 5.7 (H) 06/06/2017   VLDL 22 01/17/2016   LDLCALC 79 01/17/2016   LDLCALC 81 01/26/2015     Current Medications: Current Outpatient Medications  Medication Sig Dispense Refill  . ALPRAZolam (XANAX) 1 MG tablet Take 1 tablet (1 mg total) by mouth daily as needed for anxiety. 10 tablet 0  . aspirin 81 MG tablet Take 81 mg by mouth daily.      Marland Kitchen. atorvastatin (LIPITOR) 40 MG tablet Take 1 tablet (40 mg total) by mouth at bedtime. 90 tablet 3  . B-D UF III MINI PEN NEEDLES 31G X 5 MM MISC  USE ONCE DAILY 100 each 11  . buPROPion (WELLBUTRIN SR) 150 MG 12 hr tablet TAKE ONE TABLET BY MOUTH DAILY 30 tablet 0  . clonazePAM (KLONOPIN) 0.5 MG tablet Take 1 tablet (0.5 mg total) by mouth daily as needed for anxiety. 30 tablet 0  . empagliflozin (JARDIANCE) 10 MG TABS tablet Take 10 mg daily by mouth. 30 tablet 5  . glucose blood (ONE TOUCH TEST STRIPS) test strip Pt testing one time a day. 100 each 3  . Insulin Glargine (BASAGLAR KWIKPEN) 100 UNIT/ML SOPN INJECT 38 UNITS INTO SKIN EVERY NIGHT AT BEDTIME 15 mL 5  . lisinopril (PRINIVIL,ZESTRIL) 10 MG tablet TAKE ONE TABLET BY MOUTH DAILY 30 tablet 2  . metFORMIN (GLUCOPHAGE) 1000 MG tablet Take 1 tablet (1,000 mg total) 2 (two) times daily with a meal by mouth. 180 tablet 3  . ONE TOUCH ULTRA TEST test strip TEST as directed 100 each 3  . PARoxetine (PAXIL) 30 MG tablet One a day 30 tablet 1   No current facility-administered medications for this visit.       Psychiatric Specialty Exam: Review of Systems  Cardiovascular: Negative for chest pain.  Skin: Negative for rash.  Psychiatric/Behavioral: Positive for depression. Negative for suicidal ideas.    Blood pressure 122/78, pulse 67, height 5\' 9"  (1.753 m), weight 188 lb (85.3 kg).Body mass index is 27.76 kg/m.  General Appearance: Casual  Eye Contact:  Fair  Speech:  Slow  Volume:  Decreased  Mood: subdued  Affect:  congruent  Thought Process:  Goal Directed  Orientation:  Full (Time, Place, and Person)  Thought Content:  Rumination  Suicidal Thoughts:  No  Homicidal Thoughts:  No  Memory:  Immediate;   Fair Recent;   Fair  Judgement:  Poor  Insight:  Shallow  Psychomotor Activity:  Decreased  Concentration:  Concentration: Fair and Attention Span: Fair  Recall:  FiservFair  Fund of Knowledge:Fair  Language: Fair  Akathisia:  Negative  Handed:  Right  AIMS (if indicated):  0  Assets:  Desire for Improvement  ADL's:  Intact  Cognition: WNL  Sleep:  fair     Treatment Plan Summary: Medication management and Plan as follows   1. Major depression moderate recurrent: subdued. Continue wellbutrin, paxil 2. UJW:[id number]:fluctuats. Xanax helps . Continue paxil. Stopped klonopine earlier it didn't work  3. Panic attacks: infrequent. Carries and takes xanax prn  4. Nicotine dependence: now taking e cig. Working on cutting down  Fu 34m. Call for refills. paxil given today  Thresa Ross, MD 6/14/201912:36 PM

## 2018-04-29 ENCOUNTER — Ambulatory Visit (HOSPITAL_COMMUNITY): Payer: Self-pay | Admitting: Psychiatry

## 2018-04-30 DIAGNOSIS — E119 Type 2 diabetes mellitus without complications: Secondary | ICD-10-CM | POA: Diagnosis not present

## 2018-04-30 DIAGNOSIS — I10 Essential (primary) hypertension: Secondary | ICD-10-CM | POA: Diagnosis not present

## 2018-04-30 DIAGNOSIS — G4733 Obstructive sleep apnea (adult) (pediatric): Secondary | ICD-10-CM | POA: Diagnosis not present

## 2018-04-30 DIAGNOSIS — R69 Illness, unspecified: Secondary | ICD-10-CM | POA: Diagnosis not present

## 2018-05-05 ENCOUNTER — Telehealth (HOSPITAL_COMMUNITY): Payer: Self-pay

## 2018-05-05 NOTE — Telephone Encounter (Signed)
Patient wants refill on Xanax 1mg  ten tabs. Last refill on 04/09/18. Patient understands that Dr. Gilmore LarocheAkhtar is out of office until Thursday.

## 2018-05-08 MED ORDER — ALPRAZOLAM 1 MG PO TABS: 1.0000 mg | ORAL_TABLET | Freq: Every day | ORAL | 0 refills | Status: DC | PRN

## 2018-05-08 NOTE — Telephone Encounter (Signed)
Sent xanax

## 2018-05-16 ENCOUNTER — Encounter: Payer: Self-pay | Admitting: Family Medicine

## 2018-05-16 ENCOUNTER — Ambulatory Visit (INDEPENDENT_AMBULATORY_CARE_PROVIDER_SITE_OTHER): Payer: Medicare HMO

## 2018-05-16 ENCOUNTER — Ambulatory Visit (INDEPENDENT_AMBULATORY_CARE_PROVIDER_SITE_OTHER): Payer: Medicare HMO | Admitting: Family Medicine

## 2018-05-16 VITALS — BP 128/64 | HR 78 | Ht 69.0 in | Wt 194.0 lb

## 2018-05-16 DIAGNOSIS — R350 Frequency of micturition: Secondary | ICD-10-CM

## 2018-05-16 DIAGNOSIS — M79604 Pain in right leg: Secondary | ICD-10-CM

## 2018-05-16 DIAGNOSIS — Z13220 Encounter for screening for lipoid disorders: Secondary | ICD-10-CM

## 2018-05-16 DIAGNOSIS — M1711 Unilateral primary osteoarthritis, right knee: Secondary | ICD-10-CM | POA: Diagnosis not present

## 2018-05-16 DIAGNOSIS — M25561 Pain in right knee: Secondary | ICD-10-CM

## 2018-05-16 DIAGNOSIS — R3911 Hesitancy of micturition: Secondary | ICD-10-CM | POA: Diagnosis not present

## 2018-05-16 DIAGNOSIS — E11630 Type 2 diabetes mellitus with periodontal disease: Secondary | ICD-10-CM | POA: Diagnosis not present

## 2018-05-16 DIAGNOSIS — Z794 Long term (current) use of insulin: Secondary | ICD-10-CM | POA: Diagnosis not present

## 2018-05-16 LAB — POCT URINALYSIS DIPSTICK
BILIRUBIN UA: NEGATIVE
Blood, UA: NEGATIVE
GLUCOSE UA: NEGATIVE
KETONES UA: NEGATIVE
Leukocytes, UA: NEGATIVE
Nitrite, UA: NEGATIVE
Protein, UA: NEGATIVE
Spec Grav, UA: <=1.005 — AB (ref 1.010–1.025)
Urobilinogen, UA: 0.2 E.U./dL
pH, UA: 5.5 (ref 5.0–8.0)

## 2018-05-16 NOTE — Progress Notes (Signed)
Subjective:    Patient ID: Gabriel Gilbert, male    DOB: 1951/07/10, 67 y.o.   MRN: [id number]  HPI 67 year old male comes in today complaining of right leg pain.  He says it started about a week ago.  He denies any trauma or injury or change in his routine that would have caused some leg or knee pain.  He says the pain is mostly over the outer upper lower leg close to the knee and sometimes even radiates up past the lateral side of the knee towards the lower part of the upper leg.  Is standing a certain way for a period of time while he is waiting makes it more painful to the point that it can be intense.  He said he actually had a similar pain about a year ago the exact same leg and actually went to the emergency department.  He said they evaluated him for a blood clot and it was negative.  It eventually just went away on its own.  Nuys any swelling or tenderness of the calf muscle or of the knee itself.    He also notes that it takes him much longer to start urination.  He is has been going on for quite some time and is not really new but when he has never addressed it with me before. No known hx of BPH.  He has diabetes .  + hx of renal stones.    Review of Systems  BP 128/64   Pulse 78   Ht 5\' 9"  (1.753 m)   Wt 194 lb (88 kg)   SpO2 95%   BMI 28.65 kg/m     No Known Allergies  Past Medical History:  Diagnosis Date  . Bell's palsy 12-10  . Depression   . Diabetes mellitus    type 2  . Hyperlipidemia   . Hypertension     Past Surgical History:  Procedure Laterality Date  . CATARACT EXTRACTION Left   . kidney stone removed    . pfts     normal    Social History   Socioeconomic History  . Marital status: Single    Spouse name: Not on file  . Number of children: Not on file  . Years of education: Not on file  . Highest education level: Not on file  Occupational History  . Not on file  Social Needs  . Financial resource strain: Not on file  . Food insecurity:   Worry: Not on file    Inability: Not on file  . Transportation needs:    Medical: Not on file    Non-medical: Not on file  Tobacco Use  . Smoking status: Current Every Day Smoker    Packs/day: 0.25    Years: 25.00    Pack years: 6.25    Types: E-cigarettes    Last attempt to quit: 02/10/2013    Years since quitting: 5.2  . Smokeless tobacco: Never Used  . Tobacco comment: using electronic cigarettes  Substance and Sexual Activity  . Alcohol use: No    Alcohol/week: 0.0 oz  . Drug use: No  . Sexual activity: Never    Partners: Female  Lifestyle  . Physical activity:    Days per week: Not on file    Minutes per session: Not on file  . Stress: Not on file  Relationships  . Social connections:    Talks on phone: Not on file    Gets together: Not on file  Attends religious service: Not on file    Active member of club or organization: Not on file    Attends meetings of clubs or organizations: Not on file    Relationship status: Not on file  . Intimate partner violence:    Fear of current or ex partner: Not on file    Emotionally abused: Not on file    Physically abused: Not on file    Forced sexual activity: Not on file  Other Topics Concern  . Not on file  Social History Narrative  . Not on file    Family History  Problem Relation Age of Onset  . COPD Mother   . Hypertension Mother   . Alzheimer's disease Mother        ?  . Diabetes Father   . COPD Father   . Drug abuse Son   . COPD Sister   . Cancer Sister        lung    Outpatient Encounter Medications as of 05/16/2018  Medication Sig  . ALPRAZolam (XANAX) 1 MG tablet Take 1 tablet (1 mg total) by mouth daily as needed for anxiety.  Marland Kitchen aspirin 81 MG tablet Take 81 mg by mouth daily.    Marland Kitchen atorvastatin (LIPITOR) 40 MG tablet Take 1 tablet (40 mg total) by mouth at bedtime.  . B-D UF III MINI PEN NEEDLES 31G X 5 MM MISC USE ONCE DAILY  . buPROPion (WELLBUTRIN SR) 150 MG 12 hr tablet TAKE ONE TABLET BY MOUTH  DAILY  . empagliflozin (JARDIANCE) 10 MG TABS tablet Take 10 mg daily by mouth.  Marland Kitchen glucose blood (ONE TOUCH TEST STRIPS) test strip Pt testing one time a day.  . Insulin Glargine (BASAGLAR KWIKPEN) 100 UNIT/ML SOPN INJECT 38 UNITS INTO SKIN EVERY NIGHT AT BEDTIME  . lisinopril (PRINIVIL,ZESTRIL) 10 MG tablet TAKE ONE TABLET BY MOUTH DAILY  . metFORMIN (GLUCOPHAGE) 1000 MG tablet Take 1 tablet (1,000 mg total) 2 (two) times daily with a meal by mouth.  . Misc. Devices MISC Set CPAP to 14 cm. water pressure.  [facility].  . ONE TOUCH ULTRA TEST test strip TEST as directed  . PARoxetine (PAXIL) 30 MG tablet One a day  . [DISCONTINUED] clonazePAM (KLONOPIN) 0.5 MG tablet Take 1 tablet (0.5 mg total) by mouth daily as needed for anxiety.  . [DISCONTINUED] mirtazapine (REMERON) 15 MG tablet Take 15 mg by mouth daily.   No facility-administered encounter medications on file as of 05/16/2018.          Objective:   Physical Exam  Constitutional: He is oriented to person, place, and time. He appears well-developed and well-nourished.  HENT:  Head: Normocephalic and atraumatic.  Eyes: Conjunctivae and EOM are normal.  Cardiovascular: Normal rate.  Pulmonary/Chest: Effort normal.  Musculoskeletal:  Nontender over the knee joint itself or over the lateral joint lines.  Nontender over the patellar tendon.  Normal flexion and extension.  Strength is 5 out of 5.  He does have some crepitus on exam.  No significant laxity.  No bruising swelling.  Nontender over the calf muscle.  Neurological: He is alert and oriented to person, place, and time.  Skin: Skin is dry. No pallor.  Psychiatric: He has a normal mood and affect. His behavior is normal.  Vitals reviewed.       Assessment & Plan:  Right lateral leg pain starting from just below the knee to just above the knee laterally.  Suspect that it could be  coming from his knees will get plain film x-ray today.  If we see some arthritis and  consider physical therapy.  I really do not think there since is a blood clot I think overall his risk is low and I do not see any additional signs including tenderness or swelling.  Consider sciatica though is not having any pain in the upper outer thigh hip or low back currently.  Urinary hesitancy- AUA score of 23. QOL score of 4.  Based on his questionnaire he did have significant symptoms of urinary frequency, hesitancy, urgency and weak stream consistent with possible BPH.  When he comes back again we can always check his prostate and do an exam.  Did order a PSA today for him to get that updated as well.  We can certainly consider medication to help his symptoms.

## 2018-05-17 LAB — LIPID PANEL
CHOLESTEROL: 163 mg/dL (ref <200)
HDL: 39 mg/dL — AB (ref >40)
LDL Cholesterol (Calc): 90 mg/dL (calc)
Non-HDL Cholesterol (Calc): 124 mg/dL (calc) (ref <130)
TRIGLYCERIDES: 242 mg/dL — AB (ref <150)
Total CHOL/HDL Ratio: 4.2 (calc) (ref <5.0)

## 2018-05-17 LAB — PSA: PSA: 1.5 ng/mL (ref <=4.0)

## 2018-05-17 LAB — SPECIMEN COMPROMISED

## 2018-05-27 ENCOUNTER — Other Ambulatory Visit: Payer: Self-pay | Admitting: Family Medicine

## 2018-06-03 ENCOUNTER — Telehealth (HOSPITAL_COMMUNITY): Payer: Self-pay

## 2018-06-03 NOTE — Telephone Encounter (Signed)
[facility]

## 2018-06-04 MED ORDER — ALPRAZOLAM 1 MG PO TABS: 1.0000 mg | ORAL_TABLET | Freq: Every day | ORAL | 0 refills | Status: DC | PRN

## 2018-06-04 NOTE — Telephone Encounter (Signed)
Called and informed patient that rx was sent.

## 2018-06-04 NOTE — Telephone Encounter (Signed)
Xanax sent

## 2018-06-05 ENCOUNTER — Ambulatory Visit: Payer: Self-pay | Admitting: Family Medicine

## 2018-06-24 NOTE — Telephone Encounter (Signed)
Informed patient that med refill was sent on 7/23

## 2018-06-30 ENCOUNTER — Emergency Department (INDEPENDENT_AMBULATORY_CARE_PROVIDER_SITE_OTHER)
Admission: EM | Admit: 2018-06-30 | Discharge: 2018-06-30 | Disposition: A | Discharge status: Home / Self Care | Payer: Medicare HMO | Source: Home / Self Care | Attending: Family Medicine | Admitting: Family Medicine

## 2018-06-30 ENCOUNTER — Telehealth (HOSPITAL_COMMUNITY): Payer: Self-pay

## 2018-06-30 ENCOUNTER — Encounter: Payer: Self-pay | Admitting: Emergency Medicine

## 2018-06-30 DIAGNOSIS — R69 Illness, unspecified: Secondary | ICD-10-CM | POA: Diagnosis not present

## 2018-06-30 DIAGNOSIS — F419 Anxiety disorder, unspecified: Secondary | ICD-10-CM

## 2018-06-30 DIAGNOSIS — Z760 Encounter for issue of repeat prescription: Secondary | ICD-10-CM

## 2018-06-30 MED ORDER — ALPRAZOLAM 1 MG PO TABS: 1.0000 mg | ORAL_TABLET | Freq: Every day | ORAL | 0 refills | Status: DC | PRN

## 2018-06-30 NOTE — ED Provider Notes (Signed)
[facility]    CSN: [id number] Arrival date & time: 06/30/18  1604     History   Chief Complaint Chief Complaint  Patient presents with  . Anxiety    HPI Gabriel Gilbert is a 67 y.o. male.   HPI  Gabriel Gilbert is a 67 y.o. male presenting to UC with c/o worsening anxiety over the last few days. He was prescribed Kloponin last month but stopped taking it because he did not feel like it was helping him. He is requesting a refill of his Xanax until he can f/u with Dr. Gilmore LarocheAkhtar next week. Pt notes he recently had financial troubles and had to borrow money from his girlfriend. He is concerned he is going to need to borrow more money; which is causing his anxiety to worsen. He also notes being out of his xanax and not being able to f/u with Dr. Gilmore LarocheAkhtar until at least next week is making his anxiety worse as well. Denies chest pain or SOB. Denies thoughts of hurting himself or others.    Past Medical History:  Diagnosis Date  . Bell's palsy 12-10  . Depression   . Diabetes mellitus    type 2  . Hyperlipidemia   . Hypertension     Patient Active Problem List   Diagnosis Date Noted  . Urinary hesitancy 05/16/2018  . Nicotine dependence, uncomplicated 01/01/2016  . Hydronephrosis with urinary obstruction due to ureteral calculus 01/01/2016  . Benign non-nodular prostatic hyperplasia with lower urinary tract symptoms 01/01/2016  . Acute abdominal pain in left flank 01/01/2016  . Left ureteral stone 01/01/2016  . Left cervical radiculopathy 04/28/2015  . Chondromalacia of right patellofemoral joint 04/28/2015  . OSA on CPAP 06/26/2013  . Major depressive disorder, recurrent episode, severe, without mention of psychotic behavior 04/21/2013  . Generalized anxiety disorder 04/21/2013  . Obesity 02/14/2012  . RLS (restless legs syndrome) 03/12/2011  . DERMATITIS, HANDS 08/04/2010  . BELL'S PALSY 10/26/2009  . CORTICAL SENILE CATARACT 05/09/2009  . LUMBAR RADICULOPATHY  11/26/2008  . Type 2 diabetes mellitus with periodontal complication (HCC) 03/13/2007  . INCONTINENCE, URGE 03/13/2007  . DEPRESSION, MAJOR, RECURRENT 08/20/2006  . TOBACCO DEPENDENCE 08/20/2006  . Essential hypertension, benign 08/20/2006  . KIDNEY STONE - NEPHROLITHIASIS 08/20/2006  . SLEEP APNEA 08/20/2006    Past Surgical History:  Procedure Laterality Date  . CATARACT EXTRACTION Left   . kidney stone removed    . pfts     normal       Home Medications    Prior to Admission medications   Medication Sig Start Date End Date Taking? Authorizing Provider  ALPRAZolam Prudy Feeler(XANAX) 1 MG tablet Take 1 tablet (1 mg total) by mouth daily as needed for anxiety. 06/30/18   Lurene ShadowPhelps, Darice Vicario O, PA-C  aspirin 81 MG tablet Take 81 mg by mouth daily.      [provider]  atorvastatin (LIPITOR) 40 MG tablet Take 1 tablet (40 mg total) by mouth at bedtime. 10/08/17   Agapito GamesMetheney, Catherine D, MD  B-D UF III MINI PEN NEEDLES 31G X 5 MM MISC USE ONCE DAILY 03/03/18   Agapito GamesMetheney, Catherine D, MD  buPROPion [facility](WELLBUTRIN SR) 150 MG 12 hr tablet TAKE ONE TABLET BY MOUTH DAILY 04/15/18   Thresa RossAkhtar, Nadeem, MD  glucose blood (ONE TOUCH TEST STRIPS) test strip Pt testing one time a day. 09/23/12   Agapito GamesMetheney, Catherine D, MD  Insulin Glargine (BASAGLAR KWIKPEN) 100 UNIT/ML SOPN INJECT 38 UNITS INTO SKIN EVERY NIGHT  AT BEDTIME 11/25/17   [name], MD  JARDIANCE 10 MG TABS tablet TAKE ONE TABLET BY MOUTH DAILY 05/28/18   [name], MD  lisinopril (PRINIVIL,ZESTRIL) 10 MG tablet TAKE ONE TABLET BY MOUTH DAILY 05/28/18   [name], MD  metFORMIN (GLUCOPHAGE) 1000 MG tablet Take 1 tablet (1,000 mg total) 2 (two) times daily with a meal by mouth. 09/26/17   [name], MD  Misc. Devices MISC Set CPAP to 14 cm. water pressure.  [facility]. 04/30/18   [provider]  ONE TOUCH ULTRA TEST test strip TEST as directed 09/22/12   [name], MD    PARoxetine (PAXIL) 30 MG tablet One a day 04/25/18   Thresa Ross, MD    Family History Family History  Problem Relation Age of Onset  . COPD Mother   . Hypertension Mother   . Alzheimer's disease Mother        ?  . Diabetes Father   . COPD Father   . Drug abuse Son   . COPD Sister   . Cancer Sister        lung    Social History Social History   Tobacco Use  . Smoking status: Current Every Day Smoker    Packs/day: 0.25    Years: 25.00    Pack years: 6.25    Types: E-cigarettes    Last attempt to quit: 02/10/2013    Years since quitting: 5.3  . Smokeless tobacco: Never Used  . Tobacco comment: using electronic cigarettes  Substance Use Topics  . Alcohol use: No    Alcohol/week: 0.0 standard drinks  . Drug use: No     Allergies   Patient has no known allergies.   Review of Systems Review of Systems  Cardiovascular: Negative for chest pain and palpitations.  Psychiatric/Behavioral: Negative for self-injury and suicidal ideas. The patient is nervous/anxious.      Physical Exam Triage Vital Signs ED Triage Vitals [06/30/18 1643]  Enc Vitals Group     BP (!) 161/91     Pulse Rate 88     Resp      Temp 98.1 F (36.7 C)     Temp Source Oral     SpO2 96 %     Weight 190 lb (86.2 kg)     Height      Head Circumference      Peak Flow      Pain Score 0     Pain Loc      Pain Edu?      Excl. in GC?    No data found.  Updated Vital Signs BP (!) 161/91 (BP Location: Right Arm)   Pulse 88   Temp 98.1 F (36.7 C) (Oral)   Wt 190 lb (86.2 kg)   SpO2 96%   BMI 28.06 kg/m   Visual Acuity Right Eye Distance:   Left Eye Distance:   Bilateral Distance:    Right Eye Near:   Left Eye Near:    Bilateral Near:     Physical Exam  Constitutional: He is oriented to person, place, and time. He appears well-developed and well-nourished. No distress.  HENT:  Head: Normocephalic and atraumatic.  Eyes: EOM are normal.  Neck: Normal range of motion.   Cardiovascular: Normal rate and regular rhythm.  Pulmonary/Chest: Effort normal and breath sounds normal. No stridor. No respiratory distress. He has no wheezes. He has no rales.  Musculoskeletal: Normal range of motion.  Neurological: He is alert and oriented to person, place, and time.  Skin: Skin is warm and dry. He is not diaphoretic.  Psychiatric: His behavior is normal. His mood appears anxious.  Appears mildly anxious but is alert and cooperative during exam  Nursing note and vitals reviewed.    UC Treatments / Results  Labs (all labs ordered are listed, but only abnormal results are displayed) Labs Reviewed - No data to display  EKG None  Radiology No results found.  Procedures Procedures (including critical care time)  Medications Ordered in UC Medications - No data to display  Initial Impression / Assessment and Plan / UC Course  I have reviewed the triage vital signs and the nursing notes.  Pertinent labs & imaging results that were available during my care of the patient were reviewed by me and considered in my medical decision making (see chart for details).     Pt denies SI or HI. Will refill 10 tabs of Xanax. Encouraged to call to schedule appointment for next week. Discussed symptoms that warrant emergent care in the ED.  Final Clinical Impressions(s) / UC Diagnoses   Final diagnoses:  Anxiety  Medication refill     Discharge Instructions      Please call to schedule a follow up appointment with Dr.     ED Prescriptions    Medication Sig Dispense Auth. Provider   ALPRAZolam Prudy Feeler(XANAX) 1 MG tablet Take 1 tablet (1 mg total) by mouth daily as needed for anxiety. 10 tablet Lurene ShadowPhelps, Taila Basinski O, PA-C     Controlled Substance Prescriptions [place] Controlled Substance Registry consulted? Not Applicable   Rolla Platehelps, Victorina Kable O, PA-C 06/30/18 1925

## 2018-06-30 NOTE — Telephone Encounter (Signed)
Patient wants to know if he can go back on Klonopin. He understands that Dr. Gilmore LarocheAkhtar is out of the office this week.

## 2018-06-30 NOTE — ED Triage Notes (Signed)
Pt c/o anxiety getting worse and he is out of klonipin and xanax and states his dr will not be back in the office until Tuesday.

## 2018-06-30 NOTE — Discharge Instructions (Signed)
°  Please call to schedule a follow up appointment with Dr.

## 2018-07-01 ENCOUNTER — Telehealth: Payer: Self-pay | Admitting: Emergency Medicine

## 2018-07-08 NOTE — Telephone Encounter (Signed)
Left VM for pt informing him that per Dr. Gilmore LarocheAkhtar he can not take both Klonopin and Xanax. I informed him per Dr. Gilmore LarocheAkhtar to continue taking small dose Xanax for now.

## 2018-07-08 NOTE — Telephone Encounter (Signed)
Has been reported to us that he was getting xanax and klonopine. Can not take both. For now continue xanax small dose

## 2018-07-11 ENCOUNTER — Other Ambulatory Visit (HOSPITAL_COMMUNITY): Payer: Self-pay

## 2018-07-11 MED ORDER — PAROXETINE HCL 30 MG PO TABS: ORAL_TABLET | ORAL | 0 refills | Status: DC

## 2018-07-17 ENCOUNTER — Encounter (HOSPITAL_COMMUNITY): Payer: Self-pay | Admitting: Psychiatry

## 2018-07-17 ENCOUNTER — Ambulatory Visit (INDEPENDENT_AMBULATORY_CARE_PROVIDER_SITE_OTHER): Payer: Medicare HMO | Admitting: Psychiatry

## 2018-07-17 VITALS — BP 130/82 | HR 90 | Ht 69.0 in | Wt 191.4 lb

## 2018-07-17 DIAGNOSIS — F331 Major depressive disorder, recurrent, moderate: Secondary | ICD-10-CM | POA: Diagnosis not present

## 2018-07-17 DIAGNOSIS — Z810 Family history of intellectual disabilities: Secondary | ICD-10-CM

## 2018-07-17 DIAGNOSIS — F411 Generalized anxiety disorder: Secondary | ICD-10-CM | POA: Diagnosis not present

## 2018-07-17 DIAGNOSIS — Z813 Family history of other psychoactive substance abuse and dependence: Secondary | ICD-10-CM

## 2018-07-17 DIAGNOSIS — F410 Panic disorder [episodic paroxysmal anxiety] without agoraphobia: Secondary | ICD-10-CM

## 2018-07-17 DIAGNOSIS — F17208 Nicotine dependence, unspecified, with other nicotine-induced disorders: Secondary | ICD-10-CM

## 2018-07-17 DIAGNOSIS — F17210 Nicotine dependence, cigarettes, uncomplicated: Secondary | ICD-10-CM

## 2018-07-17 DIAGNOSIS — R69 Illness, unspecified: Secondary | ICD-10-CM | POA: Diagnosis not present

## 2018-07-17 MED ORDER — PAROXETINE HCL 10 MG PO TABS: ORAL_TABLET | ORAL | 1 refills | Status: DC

## 2018-07-17 NOTE — Progress Notes (Signed)
[facility] Outpatient Follow up visit   Patient Identification: Gabriel Gilbert MRN:  [id number] Date of Evaluation:  07/17/2018 Referral Source: primary care.  Chief Complaint:   Chief Complaint    Follow-up; Anxiety     Visit Diagnosis:    ICD-10-CM   1. Major depressive disorder, recurrent episode, moderate (HCC) F33.1   2. GAD (generalized anxiety disorder) F41.1   3. Panic disorder F41.0   4. Nicotine dependence with other nicotine-induced disorder, unspecified nicotine product type F17.208     History of Present Illness:  67 years old single white male. Referred for management of depression   Returns for follow up. Has got xanax from urgent care says was running low . Apparently has been taking klonopine low dose at times and xanax.Marland Kitchen altough he mentioned in past has stopped klonopine for which reason wanted to just take xanax  Had detail discussion today of risk of memory loss, depression and tolerance to benzo. Avoid extra use. Has stopped wellbutrin says it wasn't working, focused to be on more regular dose of xanax  Still smokes, goes to [employer] to visit friends Talked about at age 83 he was started on xanax and remains focused why it is different now Explained again risk, age , memory and depression concerns Feels more dysphoric at night, lonely   Has applied bankrupcy  Aggravating factor: loneliness  ,modifying factor: dog  Timing more in morning    Past Psychiatric History: depression ,anxiety  Previous Psychotropic Medications: Yes   Substance Abuse History in the last 12 months:  No.  Consequences of Substance Abuse: NA  Past Medical History:  Past Medical History:  Diagnosis Date  . Bell's palsy 12-10  . Depression   . Diabetes mellitus    type 2  . Hyperlipidemia   . Hypertension     Past Surgical History:  Procedure Laterality Date  . CATARACT EXTRACTION Left   . kidney stone removed    . pfts     normal    Family Psychiatric History:  anxiety. Mom used valium  Family History:  Family History  Problem Relation Age of Onset  . COPD Mother   . Hypertension Mother   . Alzheimer's disease Mother        ?  . Diabetes Father   . COPD Father   . Drug abuse Son   . COPD Sister   . Cancer Sister        lung    Social History:   Social History   Socioeconomic History  . Marital status: Single    Spouse name: Not on file  . Number of children: Not on file  . Years of education: Not on file  . Highest education level: Not on file  Occupational History  . Not on file  Social Needs  . Financial resource strain: Not on file  . Food insecurity:    Worry: Not on file    Inability: Not on file  . Transportation needs:    Medical: Not on file    Non-medical: Not on file  Tobacco Use  . Smoking status: Current Every Day Smoker    Packs/day: 0.25    Years: 25.00    Pack years: 6.25    Types: E-cigarettes    Last attempt to quit: 02/10/2013    Years since quitting: 5.4  . Smokeless tobacco: Never Used  . Tobacco comment: using electronic cigarettes  Substance and Sexual Activity  . Alcohol use: No    Alcohol/week:  0.0 standard drinks  . Drug use: No  . Sexual activity: Never    Partners: Female  Lifestyle  . Physical activity:    Days per week: Not on file    Minutes per session: Not on file  . Stress: Not on file  Relationships  . Social connections:    Talks on phone: Not on file    Gets together: Not on file    Attends religious service: Not on file    Active member of club or organization: Not on file    Attends meetings of clubs or organizations: Not on file    Relationship status: Not on file  Other Topics Concern  . Not on file  Social History Narrative  . Not on file      Allergies:  No Known Allergies  Metabolic Disorder Labs: Lab Results  Component Value Date   HGBA1C 6.2 02/04/2018   No results found for: PROLACTIN Lab Results  Component Value Date   CHOL 163 05/16/2018   TRIG  242 (H) 05/16/2018   HDL 39 (L) 05/16/2018   CHOLHDL 4.2 05/16/2018   VLDL 22 01/17/2016   LDLCALC 90 05/16/2018   LDLCALC 79 01/17/2016     Current Medications: Current Outpatient Medications  Medication Sig Dispense Refill  . ALPRAZolam (XANAX) 1 MG tablet Take 1 tablet (1 mg total) by mouth daily as needed for anxiety. 10 tablet 0  . aspirin 81 MG tablet Take 81 mg by mouth daily.      Marland Kitchen atorvastatin (LIPITOR) 40 MG tablet Take 1 tablet (40 mg total) by mouth at bedtime. 90 tablet 3  . B-D UF III MINI PEN NEEDLES 31G X 5 MM MISC USE ONCE DAILY 100 each 11  . glucose blood (ONE TOUCH TEST STRIPS) test strip Pt testing one time a day. 100 each 3  . Insulin Glargine (BASAGLAR KWIKPEN) 100 UNIT/ML SOPN INJECT 38 UNITS INTO SKIN EVERY NIGHT AT BEDTIME 15 mL 5  . JARDIANCE 10 MG TABS tablet TAKE ONE TABLET BY MOUTH DAILY 30 tablet 4  . lisinopril (PRINIVIL,ZESTRIL) 10 MG tablet TAKE ONE TABLET BY MOUTH DAILY 90 tablet 1  . metFORMIN (GLUCOPHAGE) 1000 MG tablet Take 1 tablet (1,000 mg total) 2 (two) times daily with a meal by mouth. 180 tablet 3  . Misc. Devices MISC Set CPAP to 14 cm. water pressure.  [facility].    . ONE TOUCH ULTRA TEST test strip TEST as directed 100 each 3  . PARoxetine (PAXIL) 10 MG tablet One a day 30 tablet 1   No current facility-administered medications for this visit.       Psychiatric Specialty Exam: Review of Systems  Cardiovascular: Negative for palpitations.  Skin: Negative for rash.  Psychiatric/Behavioral: Negative for suicidal ideas. The patient is nervous/anxious.     Blood pressure 130/82, pulse 90, height 5\' 9"  (1.753 m), weight 191 lb 6.4 oz (86.8 kg), SpO2 95 %.Body mass index is 28.26 kg/m.  General Appearance: Casual  Eye Contact:  Fair  Speech:  Slow  Volume:  Decreased  Mood: subdued,   Affect:  congruent  Thought Process:  Goal Directed  Orientation:  Full (Time, Place, and Person)  Thought Content:  Rumination   Suicidal Thoughts:  No  Homicidal Thoughts:  No  Memory:  Immediate;   Fair Recent;   Fair  Judgement:  Poor  Insight:  Shallow  Psychomotor Activity:  Decreased  Concentration:  Concentration: Fair and Attention Span: Fair  Recall:  Fair  [employer] of Knowledge:Fair  Language: Fair  Akathisia:  Negative  Handed:  Right  AIMS (if indicated):  0  Assets:  Desire for Improvement  ADL's:  Intact  Cognition: WNL  Sleep:  fair    Treatment Plan Summary: Medication management and Plan as follows   1. Major depression moderate recurrent:subdued, has stopped wellbutrin. Will add 10mg  paxil to in addition to 30mg  already taking 2. YWV:PXTGGYIRSW, gets easily stressed. Refer to therapy. Does not want to stop xanax. Can continue low dose prn  Takes half of 1mg  prn . Continue paxil and add 10mg  more 3. Panic attacks: infrequent. Carries and takes xanax prn Continue paxil 4. Nicotine dependence: now taking e cig. Working on cutting down Has paxil 30mg . Added 10mg . Call for xanax in 7 days when refill due.   Thresa Ross, MD 9/5/20191:58 PM

## 2018-07-23 ENCOUNTER — Encounter (HOSPITAL_COMMUNITY): Payer: Self-pay | Admitting: Licensed Clinical Social Worker

## 2018-07-23 ENCOUNTER — Ambulatory Visit (INDEPENDENT_AMBULATORY_CARE_PROVIDER_SITE_OTHER): Payer: Medicare HMO | Admitting: Licensed Clinical Social Worker

## 2018-07-23 DIAGNOSIS — R69 Illness, unspecified: Secondary | ICD-10-CM | POA: Diagnosis not present

## 2018-07-23 DIAGNOSIS — F331 Major depressive disorder, recurrent, moderate: Secondary | ICD-10-CM | POA: Diagnosis not present

## 2018-07-23 DIAGNOSIS — F411 Generalized anxiety disorder: Secondary | ICD-10-CM

## 2018-07-23 DIAGNOSIS — F410 Panic disorder [episodic paroxysmal anxiety] without agoraphobia: Secondary | ICD-10-CM | POA: Diagnosis not present

## 2018-07-23 NOTE — Progress Notes (Signed)
Comprehensive Clinical Assessment (CCA) Note  07/23/2018 Gabriel Gilbert [id number]  Visit Diagnosis:      ICD-10-CM   1. Panic disorder F41.0   2. Major depressive disorder, recurrent episode, moderate (HCC) F33.1   3. GAD (generalized anxiety disorder) F41.1       CCA Part One  Part One has been completed on paper by the patient.  (See scanned document in Chart Review)  CCA Part Two A  Intake/Chief Complaint:  CCA Intake With Chief Complaint CCA Part Two Date: 07/23/18 CCA Part Two Time: 1307 Chief Complaint/Presenting Problem: Referred for therapy by psychiatrist Dr. Gilmore Laroche for concerns related to anxiety and depression. Patients Currently Reported Symptoms/Problems: Ruminates a lot Sometimes it is about when he suffered with panic attacks in his 63s Sometimes it is about death.  Often worries about having future panic attacks.   Does find it helpful to take Xanax to relieve his anxiety.  Psychiatrist is only prescribing 10 per month.  Patient believes this is not adequate.  When asked how he imagines he would be if he didn't have Xanax he said he probably wouldn't leave his house.  Recently came into urgent care when in a state of panic because he had run out of Xanax.    Admits to having a lack of motivation to engage in activities.  Feels lonely much of the time.      Individual's Strengths: Has a friend in [place]. Also has a girlfriend.  Goes to [employer] every morning to socialize.  Loves animals-has a hounddog and bird  Has educated himself about treatments for anxiety.      Type of Services Patient Feels Are Needed: Therapy and medication management Initial Clinical Notes/Concerns: Had panic disorder in his 65s.  Tried biofeedback, hypnotherapy  Last saw a therapist earlier this year.  Stopped going when he found out insurance wasn't going to cover it.  Hasn't found therapy to be particularly helpful overall.   Mental Health Symptoms Depression:  Depression: Worthlessness,  Increase/decrease in appetite, Fatigue, Hopelessness  Mania:  Mania: N/A  Anxiety:   Anxiety: Worrying, Tension, Irritability, Fatigue  Psychosis:  Psychosis: N/A  Trauma:  Trauma: N/A  Obsessions:  Obsessions: N/A  Compulsions:  Compulsions: N/A  Inattention:  Inattention: N/A  Hyperactivity/Impulsivity:  Hyperactivity/Impulsivity: N/A  Oppositional/Defiant Behaviors:  Oppositional/Defiant Behaviors: N/A  Borderline Personality:     Other Mood/Personality Symptoms:      Mental Status Exam Appearance and self-care  Stature:  Stature: Small  Weight:  Weight: Overweight  Clothing:  Clothing: Disheveled  Grooming:  Grooming: Neglected  Cosmetic use:  Cosmetic Use: None  Posture/gait:  Posture/Gait: Normal  Motor activity:  Motor Activity: Slowed  Sensorium  Attention:  Attention: Normal  Concentration:  Concentration: Normal  Orientation:  Orientation: X5  Recall/memory:  Recall/Memory: Defective in Remote  Affect and Mood  Affect:  Affect: Flat  Mood:  Mood: Anxious, Depressed  Relating  Eye contact:  Eye Contact: Normal  Facial expression:  Facial Expression: Constricted  Attitude toward examiner:  Attitude Toward Examiner: Cooperative  Thought and Language  Speech flow: Speech Flow: Paucity  Thought content:     Preoccupation:  Preoccupations: Ruminations  Hallucinations:     Organization:     Company secretary of Knowledge:  Fund of Knowledge: Average  Intelligence:  Intelligence: Average  Abstraction:     Judgement:  Judgement: Normal  Reality Testing:  Reality Testing: Adequate  Insight:  Insight: Fair  Decision Making:  Decision Making:  Vacilates(Reports he overanalyzes)  Social Functioning  Social Maturity:  Social Maturity: Responsible  Social Judgement:  Social Judgement: Normal  Stress  Stressors:     Coping Ability:     Skill Deficits:     Supports:      Family and Psychosocial History: Family history Marital status: Long term  relationship Long term relationship, how long?: 20 years Additional relationship information: She is originally from [place].   Are you sexually active?: No Does patient have children?: Yes How many children?: 1 How is patient's relationship with their children?: Son Carollee Herter (39)-lives in [place] with his mom and girlfriend   "I ain't talked to him for a long time.  We can't really relate to eachother"  Has a history of drug use.    Childhood History:  Childhood History By whom was/is the patient raised?: Both parents Patient's description of current relationship with people who raised him/her: Both parents are deceased.  Mom had Alzheimers.  Patient took care of her in her later years.   Does patient have siblings?: Yes Number of Siblings: 1 Description of patient's current relationship with siblings: His sister is deceased Did patient suffer any verbal/emotional/physical/sexual abuse as a child?: No Did patient suffer from severe childhood neglect?: No Has patient ever been sexually abused/assaulted/raped as an adolescent or adult?: No Was the patient ever a victim of a crime or a disaster?: No Witnessed domestic violence?: No Has patient been effected by domestic violence as an adult?: No  CCA Part Two B  Employment/Work Situation: Employment / Work Psychologist, occupational Employment situation: Retired Therapist, 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  Education: Education Did Garment/textile technologist From [employer]?: Yes Did Theme park manager?: Yes What Type of College Degree Do you Have?: Took some technical courses  Got certifcation for water treatment  Religion: Religion/Spirituality Are You A Religious Person?: No  Leisure/Recreation: Leisure / Recreation Leisure and Hobbies: Higher education careers adviser TV, Optometrist at [employer]  Exercise/Diet: Exercise/Diet Do You Exercise?: Yes What Type of Exercise Do You Do?: Run/Walk How Many Times a Week Do  You Exercise?: Daily Have You Gained or Lost A Significant Amount of Weight in the Past Six Months?: No Do You Follow a Special Diet?: No Do You Have Any Trouble Sleeping?: No  CCA Part Two C  Alcohol/Drug Use: Alcohol / Drug Use History of alcohol / drug use?: No history of alcohol / drug abuse                      CCA Part Three  ASAM's:  Six Dimensions of Multidimensional Assessment  Dimension 1:  Acute Intoxication and/or Withdrawal Potential:     Dimension 2:  Biomedical Conditions and Complications:     Dimension 3:  Emotional, Behavioral, or Cognitive Conditions and Complications:     Dimension 4:  Readiness to Change:     Dimension 5:  Relapse, Continued use, or Continued Problem Potential:     Dimension 6:  Recovery/Living Environment:      Substance use Disorder (SUD)    Social Function:  Social Functioning Social Maturity: Responsible Social Judgement: Normal  Stress:  Stress Patient Takes Medications The Way The Doctor Instructed?: Yes  Risk Assessment- Self-Harm Potential: Risk Assessment For Self-Harm Potential Thoughts of Self-Harm: No current thoughts Additional Comments for Self-Harm Potential: Denies history of harm to self  Risk Assessment -Dangerous to Others Potential:    DSM5 Diagnoses:  Patient Active Problem List   Diagnosis Date Noted  . Urinary hesitancy 05/16/2018  . Nicotine dependence, uncomplicated 01/01/2016  . Hydronephrosis with urinary obstruction due to ureteral calculus 01/01/2016  . Benign non-nodular prostatic hyperplasia with lower urinary tract symptoms 01/01/2016  . Acute abdominal pain in left flank 01/01/2016  . Left ureteral stone 01/01/2016  . Left cervical radiculopathy 04/28/2015  . Chondromalacia of right patellofemoral joint 04/28/2015  . OSA on CPAP 06/26/2013  . Major depressive disorder, recurrent episode, severe, without mention of psychotic behavior 04/21/2013  . Generalized anxiety disorder 04/21/2013   . Obesity 02/14/2012  . RLS (restless legs syndrome) 03/12/2011  . DERMATITIS, HANDS 08/04/2010  . BELL'S PALSY 10/26/2009  . CORTICAL SENILE CATARACT 05/09/2009  . LUMBAR RADICULOPATHY 11/26/2008  . Type 2 diabetes mellitus with periodontal complication (HCC) 03/13/2007  . INCONTINENCE, URGE 03/13/2007  . DEPRESSION, MAJOR, RECURRENT 08/20/2006  . TOBACCO DEPENDENCE 08/20/2006  . Essential hypertension, benign 08/20/2006  . KIDNEY STONE - NEPHROLITHIASIS 08/20/2006  . SLEEP APNEA 08/20/2006      Recommendations for Services/Supports/Treatments: Recommendations for Services/Supports/Treatments Recommendations For Services/Supports/Treatments: Individual Therapy, Medication Management  Treatment Plan Summary: OP Treatment Plan Summary: Wants to feel like he has some quality of life without anxiety getting in the way.  Referrals to Alternative Service(s): Referred to Alternative Service(s):   Place:   Date:   Time:    Referred to Alternative Service(s):   Place:   Date:   Time:    Referred to Alternative Service(s):   Place:   Date:   Time:    Referred to Alternative Service(s):   Place:   Date:   Time:     Marilu Favre

## 2018-07-25 ENCOUNTER — Telehealth (HOSPITAL_COMMUNITY): Payer: Self-pay

## 2018-07-25 MED ORDER — ALPRAZOLAM 0.5 MG PO TABS: 0.5000 mg | ORAL_TABLET | Freq: Every day | ORAL | 0 refills | Status: DC | PRN

## 2018-07-25 NOTE — Telephone Encounter (Signed)
Sent 0.5mg  30 tabs. His last one was from ED 1mg  was taking half prn. Not suppose to take it daily

## 2018-07-25 NOTE — Telephone Encounter (Signed)
Patient called requesting a refill on Xanax. Last refill was on 06/30/18. [facility] [place]

## 2018-07-28 ENCOUNTER — Other Ambulatory Visit: Payer: Self-pay | Admitting: Family Medicine

## 2018-08-06 ENCOUNTER — Ambulatory Visit (INDEPENDENT_AMBULATORY_CARE_PROVIDER_SITE_OTHER): Payer: Medicare HMO | Admitting: Licensed Clinical Social Worker

## 2018-08-06 DIAGNOSIS — F410 Panic disorder [episodic paroxysmal anxiety] without agoraphobia: Secondary | ICD-10-CM

## 2018-08-06 DIAGNOSIS — F331 Major depressive disorder, recurrent, moderate: Secondary | ICD-10-CM | POA: Diagnosis not present

## 2018-08-06 DIAGNOSIS — R69 Illness, unspecified: Secondary | ICD-10-CM | POA: Diagnosis not present

## 2018-08-06 DIAGNOSIS — F411 Generalized anxiety disorder: Secondary | ICD-10-CM | POA: Diagnosis not present

## 2018-08-07 NOTE — Progress Notes (Signed)
   THERAPIST PROGRESS NOTE  Session Time: 11:32am-12:30pm  Participation Level: Active  Behavioral Response: CasualAlertEuthymic  Type of Therapy: Individual Therapy  Treatment Goals addressed: Anxiety  Interventions: Assessment,  Psycho-ed about anxiety  Suicidal/Homicidal: Denied both  Therapist Interventions: Gathered further information about patient's treatment history.  Reviewed some basics about anxiety including the fight or flight response and what triggers it to become activated, the fact that panic symptoms are not dangerous, the cycle of anxiety and how coping through avoidance keeps anxiety going.       Summary: Identified one intervention for treating anxiety which he found effective in the past: the Emotional Freedom Technique.  Noted he has some dvds on the subject.  He was uncertain as to how often a person is to repeat the procedure in order for it to be effective.  It also wasn't clear as to whether or not he has used this technique at all recently. Reported getting one session of biofeedback at one point.  Also saw a hypnotist who gave him a tape to listen to.   Reported learning meditation many years ago.  Talked about how he practiced by repeating a mantra. Therapist asked if he ever worked with a therapist on changing his thinking to be more positive or realistic.  He said no.        Plan: Will focus on mindfulness at next session. Treatment plan review is due 10/22/18  Diagnosis: Panic disorder                         GAD                         MDD recurrent moderate    Gabriel Gilbert 08/07/2018

## 2018-08-12 ENCOUNTER — Encounter: Payer: Self-pay | Admitting: Family Medicine

## 2018-08-12 ENCOUNTER — Ambulatory Visit (INDEPENDENT_AMBULATORY_CARE_PROVIDER_SITE_OTHER): Payer: Medicare HMO | Admitting: Family Medicine

## 2018-08-12 ENCOUNTER — Telehealth: Payer: Self-pay | Admitting: Family Medicine

## 2018-08-12 VITALS — BP 118/63 | HR 78 | Ht 66.0 in | Wt 194.0 lb

## 2018-08-12 DIAGNOSIS — Z23 Encounter for immunization: Secondary | ICD-10-CM | POA: Diagnosis not present

## 2018-08-12 DIAGNOSIS — R5383 Other fatigue: Secondary | ICD-10-CM

## 2018-08-12 DIAGNOSIS — E11630 Type 2 diabetes mellitus with periodontal disease: Secondary | ICD-10-CM | POA: Diagnosis not present

## 2018-08-12 DIAGNOSIS — R6882 Decreased libido: Secondary | ICD-10-CM

## 2018-08-12 DIAGNOSIS — I10 Essential (primary) hypertension: Secondary | ICD-10-CM | POA: Diagnosis not present

## 2018-08-12 DIAGNOSIS — R69 Illness, unspecified: Secondary | ICD-10-CM | POA: Diagnosis not present

## 2018-08-12 DIAGNOSIS — Z794 Long term (current) use of insulin: Secondary | ICD-10-CM | POA: Diagnosis not present

## 2018-08-12 LAB — POCT GLYCOSYLATED HEMOGLOBIN (HGB A1C): Hemoglobin A1C: 6.5 % — AB (ref 4.0–5.6)

## 2018-08-12 MED ORDER — INSULIN NPH (HUMAN) (ISOPHANE) 100 UNIT/ML ~~LOC~~ SUSP: 20.0000 [IU] | Freq: Two times a day (BID) | SUBCUTANEOUS | 2 refills | Status: DC

## 2018-08-12 NOTE — Telephone Encounter (Signed)
Called drug rep, left VM for Rosalita Chessman [phone]) requesting samples.

## 2018-08-12 NOTE — Telephone Encounter (Signed)
Also please let him know I put in a lab slip to check his testosterone.

## 2018-08-12 NOTE — Telephone Encounter (Signed)
Please call drug rep and see fi we can ger samples of Jardiance 10mg .

## 2018-08-12 NOTE — Progress Notes (Signed)
Subjective:    CC: DM  HPI:  Diabetes - no hypoglycemic events. No wounds or sores that are not healing well. No increased thirst or urination. Checking glucose at home. Taking medications as prescribed without any side effects.  Having some difficulty affording his medications.  He is been trying to eat a ketogenic diet.  Hypertension- Pt denies chest pain, SOB, dizziness, or heart palpitations.  Taking meds as directed w/o problems.  Denies medication side effects.    Interested in considering having his testosterone levels checked.  Wonders if this could actually be contributing to some of his anxiety.  Past medical history, Surgical history, Family history not pertinant except as noted below, Social history, Allergies, and medications have been entered into the medical record, reviewed, and corrections made.   Review of Systems: No fevers, chills, night sweats, weight loss, chest pain, or shortness of breath.   Objective:    General: Well Developed, well nourished, and in no acute distress.  Neuro: Alert and oriented x3, extra-ocular muscles intact, sensation grossly intact.  HEENT: Normocephalic, atraumatic  Skin: Warm and dry, no rashes. Cardiac: Regular rate and rhythm, no murmurs rubs or gallops, no lower extremity edema.  Respiratory: Clear to auscultation bilaterally. Not using accessory muscles, speaking in full sentences.   Impression and Recommendations:   DM - Well controlled. Continue current regimen. Follow up in  4 months.  We discussed options.  He is actually doing really well on Jardiance and Hospital doctor.  We can switch him to Novolin in which with good Rx should be a lot cheaper and then will call and see if we can get some samples of Jardiance to get him through the end of the year.  HTN - Well controlled. Continue current regimen. Follow up in  4 months.   Fatigue/Low libido - Evaluate for hypogonadism -had him complete a questionnaire for low testosterone.  He  answered yes to 4 out of the 10 questions we certainly could test morning testosterone levels.  Lab slip printed

## 2018-08-13 DIAGNOSIS — E11630 Type 2 diabetes mellitus with periodontal disease: Secondary | ICD-10-CM | POA: Diagnosis not present

## 2018-08-13 DIAGNOSIS — R5383 Other fatigue: Secondary | ICD-10-CM | POA: Diagnosis not present

## 2018-08-13 DIAGNOSIS — R69 Illness, unspecified: Secondary | ICD-10-CM | POA: Diagnosis not present

## 2018-08-13 DIAGNOSIS — Z794 Long term (current) use of insulin: Secondary | ICD-10-CM | POA: Diagnosis not present

## 2018-08-13 DIAGNOSIS — I10 Essential (primary) hypertension: Secondary | ICD-10-CM | POA: Diagnosis not present

## 2018-08-13 NOTE — Telephone Encounter (Signed)
Left VM with update.  

## 2018-08-13 NOTE — Telephone Encounter (Signed)
Pt advised that samples are ready to pick.

## 2018-08-14 LAB — COMPLETE METABOLIC PANEL WITH GFR
AG Ratio: 2.3 (calc) (ref 1.0–2.5)
ALKALINE PHOSPHATASE (APISO): 55 U/L (ref 40–115)
ALT: 13 U/L (ref 9–46)
AST: 16 U/L (ref 10–35)
Albumin: 4.4 g/dL (ref 3.6–5.1)
BUN: 18 mg/dL (ref 7–25)
CO2: 27 mmol/L (ref 20–32)
CREATININE: 1.04 mg/dL (ref 0.70–1.25)
Calcium: 9.5 mg/dL (ref 8.6–10.3)
Chloride: 101 mmol/L (ref 98–110)
GFR, Est African American: 87 mL/min/{1.73_m2} (ref >=60)
GFR, Est Non African American: 75 mL/min/{1.73_m2} (ref >=60)
GLUCOSE: 144 mg/dL — AB (ref 65–99)
Globulin: 1.9 g/dL (calc) (ref 1.9–3.7)
Potassium: 4.4 mmol/L (ref 3.5–5.3)
Sodium: 137 mmol/L (ref 135–146)
Total Bilirubin: 0.5 mg/dL (ref 0.2–1.2)
Total Protein: 6.3 g/dL (ref 6.1–8.1)

## 2018-08-14 LAB — TESTOSTERONE: Testosterone: 223 ng/dL — ABNORMAL LOW (ref 250–827)

## 2018-08-15 ENCOUNTER — Other Ambulatory Visit: Payer: Self-pay | Admitting: *Deleted

## 2018-08-15 ENCOUNTER — Other Ambulatory Visit (HOSPITAL_COMMUNITY): Payer: Self-pay

## 2018-08-15 DIAGNOSIS — R6882 Decreased libido: Secondary | ICD-10-CM

## 2018-08-15 MED ORDER — PAROXETINE HCL 10 MG PO TABS: ORAL_TABLET | ORAL | 0 refills | Status: DC

## 2018-08-18 ENCOUNTER — Telehealth (HOSPITAL_COMMUNITY): Payer: Self-pay

## 2018-08-18 ENCOUNTER — Other Ambulatory Visit (HOSPITAL_COMMUNITY): Payer: Self-pay

## 2018-08-18 MED ORDER — PAROXETINE HCL 40 MG PO TABS: 40.0000 mg | ORAL_TABLET | Freq: Every day | ORAL | 0 refills | Status: DC

## 2018-08-18 NOTE — Telephone Encounter (Signed)
Received a fax from pharmacy saying that the patient said he is suppose to be on 40mg  of Paroxetine?  Next appointment is scheduled for 08-28-18   Please review and advise

## 2018-08-18 NOTE — Telephone Encounter (Signed)
Called pharmacy, patient did not pick up the 10mg  prescription of Paxil per [facility]. Discontinued the 10mg  script per Dr. Gilmore Laroche and sent in the 40mg  script.

## 2018-08-21 ENCOUNTER — Ambulatory Visit (INDEPENDENT_AMBULATORY_CARE_PROVIDER_SITE_OTHER): Payer: Medicare HMO | Admitting: Licensed Clinical Social Worker

## 2018-08-21 DIAGNOSIS — F410 Panic disorder [episodic paroxysmal anxiety] without agoraphobia: Secondary | ICD-10-CM | POA: Diagnosis not present

## 2018-08-21 DIAGNOSIS — F411 Generalized anxiety disorder: Secondary | ICD-10-CM | POA: Diagnosis not present

## 2018-08-21 DIAGNOSIS — R69 Illness, unspecified: Secondary | ICD-10-CM | POA: Diagnosis not present

## 2018-08-21 DIAGNOSIS — F331 Major depressive disorder, recurrent, moderate: Secondary | ICD-10-CM | POA: Diagnosis not present

## 2018-08-21 NOTE — Progress Notes (Signed)
   THERAPIST PROGRESS NOTE  Session Time: 2:03pm-2:56pm  Participation Level: Active  Behavioral Response: CasualAlertEuthymic  Type of Therapy: Individual Therapy  Treatment Goals addressed: Anxiety  Interventions: Mindfulness  Suicidal/Homicidal: Denied both  Therapist Interventions: Introduced the concept of mindfulness.  Emphasized how learning to focus on the present can help you to feel more in control of your emotions.  Explained how it can be useful to practice at times when you catch yourself having unhelpful thoughts.  Described how you can practice mindfulness by doing tasks in a mindful way or through focused breathing.  Encouraged patient to practice the skills regularly in addition to times of distress. Provided patient with some worksheets to read for homework.  Summary: Concluded he has a tendency to be more future oriented in his thinking, worrying about the unknown.  Specifically mentioned how he worries about death obsessively.   Noted he often rushes through activities even though he may not have anything in particular to do once the original activity is complete.    Reported his mood has been "stable."  Did not note any significant events that have occurred.       Plan: Expand on mindfulness.  Teach focused breathing exercise.   Treatment plan review is due 10/22/18  Diagnosis: Panic disorder                         GAD                         MDD recurrent moderate    Gabriel Gilbert 08/21/2018

## 2018-08-28 ENCOUNTER — Other Ambulatory Visit: Payer: Self-pay

## 2018-08-28 ENCOUNTER — Ambulatory Visit (HOSPITAL_COMMUNITY): Payer: Medicare HMO | Admitting: Psychiatry

## 2018-08-28 ENCOUNTER — Encounter (HOSPITAL_COMMUNITY): Payer: Self-pay | Admitting: Psychiatry

## 2018-08-28 VITALS — BP 148/82 | HR 81 | Ht 69.0 in | Wt 191.0 lb

## 2018-08-28 DIAGNOSIS — F410 Panic disorder [episodic paroxysmal anxiety] without agoraphobia: Secondary | ICD-10-CM | POA: Diagnosis not present

## 2018-08-28 DIAGNOSIS — F331 Major depressive disorder, recurrent, moderate: Secondary | ICD-10-CM

## 2018-08-28 DIAGNOSIS — F411 Generalized anxiety disorder: Secondary | ICD-10-CM

## 2018-08-28 DIAGNOSIS — R69 Illness, unspecified: Secondary | ICD-10-CM | POA: Diagnosis not present

## 2018-08-28 MED ORDER — ALPRAZOLAM 0.5 MG PO TABS: 0.5000 mg | ORAL_TABLET | Freq: Every day | ORAL | 0 refills | Status: DC | PRN

## 2018-08-28 MED ORDER — PAROXETINE HCL 40 MG PO TABS: 40.0000 mg | ORAL_TABLET | Freq: Every day | ORAL | 1 refills | Status: DC

## 2018-08-28 NOTE — Progress Notes (Signed)
[facility] Outpatient Follow up visit   Patient Identification: Gabriel Gilbert MRN:  [id number] Date of Evaluation:  08/28/2018 Referral Source: primary care.  Chief Complaint:   Chief Complaint    Follow-up; Other     Visit Diagnosis:    ICD-10-CM   1. Panic disorder F41.0   2. GAD (generalized anxiety disorder) F41.1   3. Major depressive disorder, recurrent episode, moderate (HCC) F33.1     History of Present Illness:  67 years old single white male. Referred for management of depression   Returns for follow up. Now limiting xanax to one or less a day Doing somewhat better on anxiety since we have increased the Paxil to 40 mg.  Explained risk of not being on higher dose of Xanax and talked about forgetfulness and addiction   Has applied bankrupcy  Aggravating factor: lonliness  ,modifying factor: dog. Some friends.  Timing in morning   Past Psychiatric History: depression ,anxiety  Previous Psychotropic Medications: Yes   Substance Abuse History in the last 12 months:  No.  Consequences of Substance Abuse: NA  Past Medical History:  Past Medical History:  Diagnosis Date  . Bell's palsy 12-10  . Depression   . Diabetes mellitus    type 2  . Hyperlipidemia   . Hypertension     Past Surgical History:  Procedure Laterality Date  . CATARACT EXTRACTION Left   . kidney stone removed    . pfts     normal    Family Psychiatric History: anxiety. Mom used valium  Family History:  Family History  Problem Relation Age of Onset  . COPD Mother   . Hypertension Mother   . Alzheimer's disease Mother        ?  . Diabetes Father   . COPD Father   . Drug abuse Son   . COPD Sister   . Cancer Sister        lung    Social History:   Social History   Socioeconomic History  . Marital status: Single    Spouse name: Not on file  . Number of children: Not on file  . Years of education: Not on file  . Highest education level: Not on file  Occupational History   . Not on file  Social Needs  . Financial resource strain: Not on file  . Food insecurity:    Worry: Not on file    Inability: Not on file  . Transportation needs:    Medical: Not on file    Non-medical: Not on file  Tobacco Use  . Smoking status: Current Every Day Smoker    Packs/day: 1.00    Years: 25.00    Pack years: 25.00    Types: Cigarettes    Last attempt to quit: 02/10/2013    Years since quitting: 5.5  . Smokeless tobacco: Never Used  . Tobacco comment: using electronic cigarettes  Substance and Sexual Activity  . Alcohol use: No    Alcohol/week: 0.0 standard drinks  . Drug use: No  . Sexual activity: Never    Partners: Female  Lifestyle  . Physical activity:    Days per week: Not on file    Minutes per session: Not on file  . Stress: Not on file  Relationships  . Social connections:    Talks on phone: Not on file    Gets together: Not on file    Attends religious service: Not on file    Active member of club or  organization: Not on file    Attends meetings of clubs or organizations: Not on file    Relationship status: Not on file  Other Topics Concern  . Not on file  Social History Narrative  . Not on file      Allergies:  No Known Allergies  Metabolic Disorder Labs: Lab Results  Component Value Date   HGBA1C 6.5 (A) 08/12/2018   No results found for: PROLACTIN Lab Results  Component Value Date   CHOL 163 05/16/2018   TRIG 242 (H) 05/16/2018   HDL 39 (L) 05/16/2018   CHOLHDL 4.2 05/16/2018   VLDL 22 01/17/2016   LDLCALC 90 05/16/2018   LDLCALC 79 01/17/2016     Current Medications: Current Outpatient Medications  Medication Sig Dispense Refill  . ALPRAZolam (XANAX) 0.5 MG tablet Take 1 tablet (0.5 mg total) by mouth daily as needed for anxiety. 30 tablet 0  . aspirin 81 MG tablet Take 81 mg by mouth daily.      Marland Kitchen atorvastatin (LIPITOR) 40 MG tablet Take 1 tablet (40 mg total) by mouth at bedtime. 90 tablet 3  . B-D UF III MINI PEN  NEEDLES 31G X 5 MM MISC USE ONCE DAILY 100 each 11  . glucose blood (ONE TOUCH TEST STRIPS) test strip Pt testing one time a day. 100 each 3  . insulin NPH Human (NOVOLIN N RELION) 100 UNIT/ML injection Inject 0.2 mLs (20 Units total) into the skin 2 (two) times daily before a meal. 20 mL 2  . JARDIANCE 10 MG TABS tablet TAKE ONE TABLET BY MOUTH DAILY 30 tablet 4  . lisinopril (PRINIVIL,ZESTRIL) 10 MG tablet TAKE ONE TABLET BY MOUTH DAILY 90 tablet 1  . metFORMIN (GLUCOPHAGE) 1000 MG tablet Take 1 tablet (1,000 mg total) 2 (two) times daily with a meal by mouth. 180 tablet 3  . Misc. Devices MISC Set CPAP to 14 cm. water pressure.  [facility].    . ONE TOUCH ULTRA TEST test strip TEST as directed 100 each 3  . PARoxetine (PAXIL) 40 MG tablet Take 1 tablet (40 mg total) by mouth daily. 30 tablet 1   No current facility-administered medications for this visit.       Psychiatric Specialty Exam: Review of Systems  Cardiovascular: Negative for chest pain.  Skin: Negative for rash.  Psychiatric/Behavioral: Negative for suicidal ideas.    Blood pressure (!) 148/82, pulse 81, height 5\' 9"  (1.753 m), weight 191 lb (86.6 kg).Body mass index is 28.21 kg/m.  General Appearance: Casual  Eye Contact:  Fair  Speech:  Slow  Volume:  Decreased  Mood: some better  Affect:  congruent  Thought Process:  Goal Directed  Orientation:  Full (Time, Place, and Person)  Thought Content:  Rumination  Suicidal Thoughts:  No  Homicidal Thoughts:  No  Memory:  Immediate;   Fair Recent;   Fair  Judgement:  Poor  Insight:  Shallow  Psychomotor Activity:  Decreased  Concentration:  Concentration: Fair and Attention Span: Fair  Recall:  [employer] of Knowledge:Fair  Language: Fair  Akathisia:  Negative  Handed:  Right  AIMS (if indicated):  0  Assets:  Desire for Improvement  ADL's:  Intact  Cognition: WNL  Sleep:  fair    Treatment Plan Summary: Medication management and Plan as follows    1. Major depression moderate recurrent: some better. Continue paxil 40mg  2. GAD gluctuates. Continue paxil   3. Panic attacks:infrequent. Take xanax prn or qd 4.  Nicotine dependence: now taking e cig. Working on cutting down Fu 46m.   Thresa Ross, MD 10/17/20191:51 PM

## 2018-09-11 ENCOUNTER — Telehealth: Payer: Self-pay

## 2018-09-11 MED ORDER — BASAGLAR KWIKPEN 100 UNIT/ML ~~LOC~~ SOPN: 38.0000 [IU] | PEN_INJECTOR | Freq: Every day | SUBCUTANEOUS | 3 refills | Status: DC

## 2018-09-11 NOTE — Telephone Encounter (Signed)
Okay, new prescription sent for the [facility] which is what he was on previously.

## 2018-09-11 NOTE — Telephone Encounter (Signed)
Pt had RX sent in for Novolin N Vial but he reports to pharmacy that he does not feel comfortable using vial.   Pt requesting new RX for Stephanie Coup be sent to [facility]

## 2018-09-11 NOTE — Telephone Encounter (Signed)
Pt does not want the Basaglar KwikPen.   He wants something cheaper. Reports that was why the Novolin was sent. He is not comfortable using a vial. Questions if the Novolin comes in a KwikPen, or if there is a cheaper option than the Basaglar.

## 2018-09-11 NOTE — Telephone Encounter (Signed)
Called pt and he stated that the pharmacy is looking into this for him to find something out. I told him that Dr. [name] would not be able to do this for him and that he would need to work with the pharmacy and his insurance company to see what is covered. He voiced understanding and agreed.Laureen Ochs, Viann Shove, CMA

## 2018-09-23 ENCOUNTER — Ambulatory Visit (INDEPENDENT_AMBULATORY_CARE_PROVIDER_SITE_OTHER): Payer: Medicare HMO | Admitting: Family Medicine

## 2018-09-23 ENCOUNTER — Telehealth: Payer: Self-pay | Admitting: Family Medicine

## 2018-09-23 ENCOUNTER — Encounter: Payer: Self-pay | Admitting: Family Medicine

## 2018-09-23 VITALS — BP 136/66 | HR 78 | Ht 66.24 in | Wt 190.0 lb

## 2018-09-23 DIAGNOSIS — Z6830 Body mass index (BMI) 30.0-30.9, adult: Secondary | ICD-10-CM

## 2018-09-23 DIAGNOSIS — R635 Abnormal weight gain: Secondary | ICD-10-CM | POA: Diagnosis not present

## 2018-09-23 NOTE — Telephone Encounter (Signed)
Please call patient and let him know that I would prefer that he not take the MCT Oil.  I think it could affect his triglyceride levels which are already high.  I would encourage him to continue to work on increasing his activity getting his heart rate up and continuing to cut back on carbohydrates and sweets and work on increasing vegetables and lean protein. We could always refer him to a nutritionist if he would like.

## 2018-09-23 NOTE — Progress Notes (Signed)
   Subjective:    Patient ID: Gabriel Gilbert, male    DOB: Mar 10, 1951, 67 y.o.   MRN: [id number]  HPI 67 year old male is here today to discuss strategies for weight loss he is interested in medication if possible.  He says he has been working to cut back on his carbohydrate intake, he is been walking his dog daily and also walks around [employer] for exercise.  He has lost 4 pounds since he was here a month ago.  He wants to try MCT oil.  He was told by the sales rep to consult his doctor bc of hx of HTN and DM.  Recently he had been trying to eat more protein.  Review of Systems     Objective:   Physical Exam  Constitutional: He is oriented to person, place, and time. He appears well-developed and well-nourished.  HENT:  Head: Normocephalic and atraumatic.  Eyes: Conjunctivae and EOM are normal.  Cardiovascular: Normal rate.  Pulmonary/Chest: Effort normal.  Neurological: He is alert and oriented to person, place, and time.  Skin: Skin is dry. No pallor.  Psychiatric: He has a normal mood and affect. His behavior is normal.  Vitals reviewed.      Assessment & Plan:  Abnormal weight gain- congratulated him on 4 lb weight loss.  Courage him to continue to work on his diet and exercise.  Encouraged him just to try to really increase his cardio.  In regards to the MCT Oil it evidently has made from fatty acids and is derived from coconut oil.  I am a little concerned that this could increase his triglyceride levels.  There we could keep an eye on it and if he wanted to try the supplement then we could always have him recheck his lipids after starting the medication.  We also discussed that there are some diabetic drugs that can help with weight loss such as Ozempic, Victoza, and Trulicity and certainly this would be consideration that he is already having a difficult time affording his insulin so that may be a challenge.  Gust possibly adding Premier protein for breakfast since he does not  really eat breakfast.  Time spent 20 minutes, greater than 50% of the time spent face-to-face sling about weight gain and options.

## 2018-09-24 NOTE — Telephone Encounter (Signed)
Pt advised. Verbalized understanding. Declined nutritionist referral at this time.

## 2018-09-25 ENCOUNTER — Other Ambulatory Visit (HOSPITAL_COMMUNITY): Payer: Self-pay | Admitting: Psychiatry

## 2018-09-29 ENCOUNTER — Ambulatory Visit (INDEPENDENT_AMBULATORY_CARE_PROVIDER_SITE_OTHER): Payer: Medicare HMO | Admitting: Licensed Clinical Social Worker

## 2018-09-29 DIAGNOSIS — F411 Generalized anxiety disorder: Secondary | ICD-10-CM

## 2018-09-29 DIAGNOSIS — F331 Major depressive disorder, recurrent, moderate: Secondary | ICD-10-CM

## 2018-09-29 DIAGNOSIS — R69 Illness, unspecified: Secondary | ICD-10-CM | POA: Diagnosis not present

## 2018-09-29 NOTE — Progress Notes (Signed)
   THERAPIST PROGRESS NOTE  Session Time: 1:10pm-1:56pm  Participation Level: Active  Behavioral Response: Disheveled  Had a strong odor of dog   Alert  Blunted affect   Type of Therapy: Individual Therapy  Treatment Goals addressed: Anxiety  Interventions:   Suicidal/Homicidal: Denied both  Therapist Interventions: Checked on use of mindfulness skills.   Talked about how loneliness contributes to his depression.  Offered suggestions for connecting with people locally.  Mentioned the [employer].  Patient said he was not willing to get involved in anything in [place] or [place].   Asked patient if there was anything in particular he wanted to address in therapy.  He did not identify anything.  Collaboratively decided to terminate therapy at this time.     Summary:  Reported his mood has been "about the same."  Hasn't been motivated to do much beyond his usual routine of watching TV, going to [employer], and walking his dog.  Denied having any panic attacks, but said anxiety remains moderate. Got the impression patient wasn't interested in learning more about how to apply mindfulness.  He said he read the packet of information but didn't do much beyond that.  Talked about how he makes an effort not to focus on his thoughts and he takes his Xanax first thing in the morning to quiet his unhelpful thoughts.   Talked about a friend he used to spend time with on a daily basis whom he had a lot in common.  He also had issues with anxiety.  This friend died in January.            Plan: No further sessions scheduled  Diagnosis: Panic disorder                         GAD                         MDD recurrent moderate    Darrin LuisSolomon, Sarah A, LCSW 09/29/2018

## 2018-10-01 ENCOUNTER — Telehealth (HOSPITAL_COMMUNITY): Payer: Self-pay

## 2018-10-01 ENCOUNTER — Other Ambulatory Visit (HOSPITAL_COMMUNITY): Payer: Self-pay | Admitting: Psychiatry

## 2018-10-01 NOTE — Telephone Encounter (Signed)
Patient called requesting a refill for Xanax. [facility]/18[facility]

## 2018-10-02 MED ORDER — ALPRAZOLAM 0.5 MG PO TABS: 0.5000 mg | ORAL_TABLET | Freq: Every day | ORAL | 0 refills | Status: DC | PRN

## 2018-10-02 NOTE — Telephone Encounter (Signed)
Can send to pharmacy. Not able to do online for some reason

## 2018-10-02 NOTE — Telephone Encounter (Signed)
Printed rx for Dr. Gilmore LarocheAkhtar to sign. Patient stated he will pick up rx from our office

## 2018-10-06 ENCOUNTER — Ambulatory Visit (INDEPENDENT_AMBULATORY_CARE_PROVIDER_SITE_OTHER): Payer: Medicare HMO | Admitting: Psychiatry

## 2018-10-06 ENCOUNTER — Encounter (HOSPITAL_COMMUNITY): Payer: Self-pay | Admitting: Psychiatry

## 2018-10-06 VITALS — BP 132/88 | HR 69 | Ht 66.0 in | Wt 197.0 lb

## 2018-10-06 DIAGNOSIS — F331 Major depressive disorder, recurrent, moderate: Secondary | ICD-10-CM

## 2018-10-06 DIAGNOSIS — F17208 Nicotine dependence, unspecified, with other nicotine-induced disorders: Secondary | ICD-10-CM

## 2018-10-06 DIAGNOSIS — F410 Panic disorder [episodic paroxysmal anxiety] without agoraphobia: Secondary | ICD-10-CM | POA: Diagnosis not present

## 2018-10-06 DIAGNOSIS — R69 Illness, unspecified: Secondary | ICD-10-CM | POA: Diagnosis not present

## 2018-10-06 DIAGNOSIS — F411 Generalized anxiety disorder: Secondary | ICD-10-CM | POA: Diagnosis not present

## 2018-10-06 NOTE — Progress Notes (Signed)
[facility] Outpatient Follow up visit   Patient Identification: Gabriel Gilbert MRN:  [id number] Date of Evaluation:  10/06/2018 Referral Source: primary care.  Chief Complaint:   Chief Complaint    Follow-up     Visit Diagnosis:    ICD-10-CM   1. GAD (generalized anxiety disorder) F41.1   2. Major depressive disorder, recurrent episode, moderate (HCC) F33.1   3. Panic disorder F41.0   4. Nicotine dependence with other nicotine-induced disorder, unspecified nicotine product type F17.208     History of Present Illness:  67 years old single white male. Referred for management of depression  Doing fair. Xanax prn helps anxiety or panic.  On paxil Goes to [employer] daily to keep busy and coffee  Counsellor has told him no longer need be in therapy Still feels lonely Explained risk of not being on higher dose of Xanax and talked about forgetfulness and addiction   Has applied bankrupcy  Aggravating factor: lonliness  ,modifying factor: dog. Some friends.  Timing in morning   Past Psychiatric History: depression ,anxiety  Previous Psychotropic Medications: Yes   Substance Abuse History in the last 12 months:  No.  Consequences of Substance Abuse: NA  Past Medical History:  Past Medical History:  Diagnosis Date  . Bell's palsy 12-10  . Depression   . Diabetes mellitus    type 2  . Hyperlipidemia   . Hypertension     Past Surgical History:  Procedure Laterality Date  . CATARACT EXTRACTION Left   . kidney stone removed    . pfts     normal    Family Psychiatric History: anxiety. Mom used valium  Family History:  Family History  Problem Relation Age of Onset  . COPD Mother   . Hypertension Mother   . Alzheimer's disease Mother        ?  . Diabetes Father   . COPD Father   . Drug abuse Son   . COPD Sister   . Cancer Sister        lung    Social History:   Social History   Socioeconomic History  . Marital status: Single    Spouse name: Not on file   . Number of children: Not on file  . Years of education: Not on file  . Highest education level: Not on file  Occupational History  . Not on file  Social Needs  . Financial resource strain: Not on file  . Food insecurity:    Worry: Not on file    Inability: Not on file  . Transportation needs:    Medical: Not on file    Non-medical: Not on file  Tobacco Use  . Smoking status: Current Every Day Smoker    Packs/day: 1.00    Years: 25.00    Pack years: 25.00    Types: Cigarettes    Last attempt to quit: 02/10/2013    Years since quitting: 5.6  . Smokeless tobacco: Never Used  . Tobacco comment: using electronic cigarettes  Substance and Sexual Activity  . Alcohol use: No    Alcohol/week: 0.0 standard drinks  . Drug use: No  . Sexual activity: Never    Partners: Female  Lifestyle  . Physical activity:    Days per week: Not on file    Minutes per session: Not on file  . Stress: Not on file  Relationships  . Social connections:    Talks on phone: Not on file    Gets together: Not on  file    Attends religious service: Not on file    Active member of club or organization: Not on file    Attends meetings of clubs or organizations: Not on file    Relationship status: Not on file  Other Topics Concern  . Not on file  Social History Narrative  . Not on file      Allergies:  No Known Allergies  Metabolic Disorder Labs: Lab Results  Component Value Date   HGBA1C 6.5 (A) 08/12/2018   No results found for: PROLACTIN Lab Results  Component Value Date   CHOL 163 05/16/2018   TRIG 242 (H) 05/16/2018   HDL 39 (L) 05/16/2018   CHOLHDL 4.2 05/16/2018   VLDL 22 01/17/2016   LDLCALC 90 05/16/2018   LDLCALC 79 01/17/2016     Current Medications: Current Outpatient Medications  Medication Sig Dispense Refill  . ALPRAZolam (XANAX) 0.5 MG tablet Take 1 tablet (0.5 mg total) by mouth daily as needed for anxiety. 30 tablet 0  . aspirin 81 MG tablet Take 81 mg by mouth  daily.      Marland Kitchen. atorvastatin (LIPITOR) 40 MG tablet Take 1 tablet (40 mg total) by mouth at bedtime. 90 tablet 3  . B-D UF III MINI PEN NEEDLES 31G X 5 MM MISC USE ONCE DAILY 100 each 11  . glucose blood (ONE TOUCH TEST STRIPS) test strip Pt testing one time a day. 100 each 3  . Insulin Glargine (BASAGLAR KWIKPEN) 100 UNIT/ML SOPN Inject 0.38 mLs (38 Units total) into the skin at bedtime. 15 mL 3  . JARDIANCE 10 MG TABS tablet TAKE ONE TABLET BY MOUTH DAILY 30 tablet 4  . lisinopril (PRINIVIL,ZESTRIL) 10 MG tablet TAKE ONE TABLET BY MOUTH DAILY 90 tablet 1  . metFORMIN (GLUCOPHAGE) 1000 MG tablet Take 1 tablet (1,000 mg total) 2 (two) times daily with a meal by mouth. 180 tablet 3  . Misc. Devices MISC Set CPAP to 14 cm. water pressure.  [facility].    . Misc. Devices MISC Please order a new mask DME:[facility]    . ONE TOUCH ULTRA TEST test strip TEST as directed 100 each 3  . PARoxetine (PAXIL) 40 MG tablet Take 1 tablet (40 mg total) by mouth daily. 30 tablet 1  . PARoxetine (PAXIL) 40 MG tablet TAKE ONE TABLET BY MOUTH DAILY 30 tablet 0   No current facility-administered medications for this visit.       Psychiatric Specialty Exam: Review of Systems  Cardiovascular: Negative for palpitations.  Skin: Negative for rash.  Psychiatric/Behavioral: Negative for suicidal ideas.    Blood pressure 132/88, pulse 69, height 5\' 6"  (1.676 m), weight 197 lb (89.4 kg).Body mass index is 31.8 kg/m.  General Appearance: Casual  Eye Contact:  Fair  Speech:  Slow  Volume:  Decreased  Mood: fair  Affect:  congruent  Thought Process:  Goal Directed  Orientation:  Full (Time, Place, and Person)  Thought Content:  Rumination  Suicidal Thoughts:  No  Homicidal Thoughts:  No  Memory:  Immediate;   Fair Recent;   Fair  Judgement:  Poor  Insight:  Shallow  Psychomotor Activity:  Decreased  Concentration:  Concentration: Fair and Attention Span: Fair  Recall:  [employer]  Fund of Knowledge:Fair   Language: Fair  Akathisia:  Negative  Handed:  Right  AIMS (if indicated):  0  Assets:  Desire for Improvement  ADL's:  Intact  Cognition: WNL  Sleep:  fair  Treatment Plan Summary: Medication management and Plan as follows   1. Major depression moderate recurrent:fair. Not worse. Continue paxil 2. GAD gluctuates. Continue paxil   3. Panic attacks:infrequent. Take xanax prn or qd 4. Nicotine dependence: now taking e cig. Working on cutting down Fu 80m.   Thresa Ross, MD 11/25/20193:13 PM

## 2018-10-30 ENCOUNTER — Telehealth (HOSPITAL_COMMUNITY): Payer: Self-pay

## 2018-10-30 MED ORDER — ALPRAZOLAM 0.5 MG PO TABS: 0.5000 mg | ORAL_TABLET | Freq: Every day | ORAL | 0 refills | Status: DC | PRN

## 2018-10-30 NOTE — Telephone Encounter (Signed)
Informed patient of medication sent.

## 2018-10-30 NOTE — Telephone Encounter (Signed)
sent 

## 2018-10-30 NOTE — Telephone Encounter (Signed)
Patient called for a refill on Alprazolam.Last refill was on 10/02/18.  [facility] [place]

## 2018-11-02 ENCOUNTER — Other Ambulatory Visit (HOSPITAL_COMMUNITY): Payer: Self-pay | Admitting: Psychiatry

## 2018-11-02 ENCOUNTER — Other Ambulatory Visit: Payer: Self-pay | Admitting: Family Medicine

## 2018-11-03 DIAGNOSIS — G4733 Obstructive sleep apnea (adult) (pediatric): Secondary | ICD-10-CM | POA: Diagnosis not present

## 2018-11-17 ENCOUNTER — Ambulatory Visit (INDEPENDENT_AMBULATORY_CARE_PROVIDER_SITE_OTHER): Payer: Medicare HMO | Admitting: Psychiatry

## 2018-11-17 ENCOUNTER — Encounter (HOSPITAL_COMMUNITY): Payer: Self-pay | Admitting: Psychiatry

## 2018-11-17 ENCOUNTER — Other Ambulatory Visit: Payer: Self-pay

## 2018-11-17 VITALS — BP 134/80 | HR 88 | Ht 66.0 in | Wt 192.0 lb

## 2018-11-17 DIAGNOSIS — F411 Generalized anxiety disorder: Secondary | ICD-10-CM | POA: Diagnosis not present

## 2018-11-17 DIAGNOSIS — F331 Major depressive disorder, recurrent, moderate: Secondary | ICD-10-CM | POA: Diagnosis not present

## 2018-11-17 DIAGNOSIS — F410 Panic disorder [episodic paroxysmal anxiety] without agoraphobia: Secondary | ICD-10-CM

## 2018-11-17 DIAGNOSIS — R69 Illness, unspecified: Secondary | ICD-10-CM | POA: Diagnosis not present

## 2018-11-17 MED ORDER — PAROXETINE HCL 40 MG PO TABS: 40.0000 mg | ORAL_TABLET | Freq: Every day | ORAL | 0 refills | Status: DC

## 2018-11-17 NOTE — Progress Notes (Signed)
[facility] Outpatient Follow up visit   Patient Identification: Gabriel Gilbert MRN:  [id number] Date of Evaluation:  11/17/2018 Referral Source: primary care.  Chief Complaint:   Chief Complaint    Follow-up; Other     Visit Diagnosis:    ICD-10-CM   1. Major depressive disorder, recurrent episode, moderate (HCC) F33.1   2. GAD (generalized anxiety disorder) F41.1   3. Panic disorder F41.0     History of Present Illness:  68 years old single white male. Referred for management of depression  Doing fair goes to [employer] meet his friends tries to keep himself active sporadic panic attacks takes Xanax every day sometimes cut down the dose as we have recommended on Paxil for depression as well.  Still feels lonely Explained risk of not being on higher dose of Xanax and talked about forgetfulness and addiction   Has applied bankrupcy  Aggravating factor: lonliness  ,modifying factor: dog. Some friends.  Timing in morning   Past Psychiatric History: depression ,anxiety  Previous Psychotropic Medications: Yes   Substance Abuse History in the last 12 months:  No.  Consequences of Substance Abuse: NA  Past Medical History:  Past Medical History:  Diagnosis Date  . Bell's palsy 12-10  . Depression   . Diabetes mellitus    type 2  . Hyperlipidemia   . Hypertension     Past Surgical History:  Procedure Laterality Date  . CATARACT EXTRACTION Left   . kidney stone removed    . pfts     normal    Family Psychiatric History: anxiety. Mom used valium  Family History:  Family History  Problem Relation Age of Onset  . COPD Mother   . Hypertension Mother   . Alzheimer's disease Mother        ?  . Diabetes Father   . COPD Father   . Drug abuse Son   . COPD Sister   . Cancer Sister        lung    Social History:   Social History   Socioeconomic History  . Marital status: Single    Spouse name: Not on file  . Number of children: Not on file  . Years of  education: Not on file  . Highest education level: Not on file  Occupational History  . Not on file  Social Needs  . Financial resource strain: Not on file  . Food insecurity:    Worry: Not on file    Inability: Not on file  . Transportation needs:    Medical: Not on file    Non-medical: Not on file  Tobacco Use  . Smoking status: Current Every Day Smoker    Packs/day: 1.00    Years: 25.00    Pack years: 25.00    Types: Cigarettes    Last attempt to quit: 02/10/2013    Years since quitting: 5.7  . Smokeless tobacco: Never Used  . Tobacco comment: using electronic cigarettes  Substance and Sexual Activity  . Alcohol use: No    Alcohol/week: 0.0 standard drinks  . Drug use: No  . Sexual activity: Never    Partners: Female  Lifestyle  . Physical activity:    Days per week: Not on file    Minutes per session: Not on file  . Stress: Not on file  Relationships  . Social connections:    Talks on phone: Not on file    Gets together: Not on file    Attends religious service: Not  on file    Active member of club or organization: Not on file    Attends meetings of clubs or organizations: Not on file    Relationship status: Not on file  Other Topics Concern  . Not on file  Social History Narrative  . Not on file      Allergies:  No Known Allergies  Metabolic Disorder Labs: Lab Results  Component Value Date   HGBA1C 6.5 (A) 08/12/2018   No results found for: PROLACTIN Lab Results  Component Value Date   CHOL 163 05/16/2018   TRIG 242 (H) 05/16/2018   HDL 39 (L) 05/16/2018   CHOLHDL 4.2 05/16/2018   VLDL 22 01/17/2016   LDLCALC 90 05/16/2018   LDLCALC 79 01/17/2016     Current Medications: Current Outpatient Medications  Medication Sig Dispense Refill  . ALPRAZolam (XANAX) 0.5 MG tablet Take 1 tablet (0.5 mg total) by mouth daily as needed for anxiety. 30 tablet 0  . aspirin 81 MG tablet Take 81 mg by mouth daily.      Marland Kitchen atorvastatin (LIPITOR) 40 MG tablet  Take 1 tablet (40 mg total) by mouth at bedtime. 90 tablet 3  . B-D UF III MINI PEN NEEDLES 31G X 5 MM MISC USE ONCE DAILY 100 each 11  . glucose blood (ONE TOUCH TEST STRIPS) test strip Pt testing one time a day. 100 each 3  . Insulin Glargine (BASAGLAR KWIKPEN) 100 UNIT/ML SOPN Inject 0.38 mLs (38 Units total) into the skin at bedtime. 15 mL 3  . JARDIANCE 10 MG TABS tablet TAKE ONE TABLET BY MOUTH DAILY 30 tablet 4  . lisinopril (PRINIVIL,ZESTRIL) 10 MG tablet TAKE ONE TABLET BY MOUTH DAILY 90 tablet 1  . metFORMIN (GLUCOPHAGE) 1000 MG tablet TAKE ONE TABLET BY MOUTH TWICE A DAY WITH FOOD 180 tablet 0  . Misc. Devices MISC Set CPAP to 14 cm. water pressure.  [facility].    . Misc. Devices MISC Please order a new mask DME:[facility]    . ONE TOUCH ULTRA TEST test strip TEST as directed 100 each 3  . PARoxetine (PAXIL) 40 MG tablet Take 1 tablet (40 mg total) by mouth daily. 30 tablet 0   No current facility-administered medications for this visit.       Psychiatric Specialty Exam: Review of Systems  Cardiovascular: Negative for chest pain.  Skin: Negative for rash.  Psychiatric/Behavioral: Negative for suicidal ideas.    Blood pressure 134/80, pulse 88, height 5\' 6"  (1.676 m), weight 192 lb (87.1 kg).Body mass index is 30.99 kg/m.  General Appearance: Casual  Eye Contact:  Fair  Speech:  Slow  Volume:  Decreased  Mood: fair  Affect:  congruent  Thought Process:  Goal Directed  Orientation:  Full (Time, Place, and Person)  Thought Content:  Rumination  Suicidal Thoughts:  No  Homicidal Thoughts:  No  Memory:  Immediate;   Fair Recent;   Fair  Judgement:  Poor  Insight:  Shallow  Psychomotor Activity:  Decreased  Concentration:  Concentration: Fair and Attention Span: Fair  Recall:  [employer] of Knowledge:Fair  Language: Fair  Akathisia:  Negative  Handed:  Right  AIMS (if indicated):  0  Assets:  Desire for Improvement  ADL's:  Intact  Cognition: WNL  Sleep:   fair    Treatment Plan Summary: Medication management and Plan as follows   1. Major depression moderate recurrent: fair. Continue paxil 2. GAD gluctuates. Continue paxil   3. Panic  attacks:infrequent. Take xanax prn or qd 4. Nicotine dependence: now taking e cig. Working on cutting down Fu 2550m. Call for xanax refills  Thresa RossNadeem Fallynn Gravett, MD 1/6/20202:16 PM

## 2018-11-19 DIAGNOSIS — G4733 Obstructive sleep apnea (adult) (pediatric): Secondary | ICD-10-CM | POA: Diagnosis not present

## 2018-11-19 DIAGNOSIS — G4710 Hypersomnia, unspecified: Secondary | ICD-10-CM | POA: Diagnosis not present

## 2018-11-19 DIAGNOSIS — Z9989 Dependence on other enabling machines and devices: Secondary | ICD-10-CM | POA: Diagnosis not present

## 2018-11-19 DIAGNOSIS — R69 Illness, unspecified: Secondary | ICD-10-CM | POA: Diagnosis not present

## 2018-11-26 DIAGNOSIS — G4733 Obstructive sleep apnea (adult) (pediatric): Secondary | ICD-10-CM | POA: Diagnosis not present

## 2018-11-27 ENCOUNTER — Telehealth (HOSPITAL_COMMUNITY): Payer: Self-pay

## 2018-11-27 MED ORDER — ALPRAZOLAM 0.5 MG PO TABS: 0.5000 mg | ORAL_TABLET | Freq: Every day | ORAL | 0 refills | Status: DC | PRN

## 2018-11-27 NOTE — Telephone Encounter (Signed)
Patient needs a refill on Alprazolam sent to [facility] in [place] 

## 2018-11-27 NOTE — Telephone Encounter (Signed)
sent 

## 2018-12-03 ENCOUNTER — Other Ambulatory Visit: Payer: Self-pay | Admitting: Family Medicine

## 2018-12-15 ENCOUNTER — Encounter: Payer: Self-pay | Admitting: Family Medicine

## 2018-12-15 ENCOUNTER — Ambulatory Visit (INDEPENDENT_AMBULATORY_CARE_PROVIDER_SITE_OTHER): Payer: Medicare HMO | Admitting: Family Medicine

## 2018-12-15 VITALS — BP 135/78 | HR 68 | Ht 66.0 in | Wt 197.0 lb

## 2018-12-15 DIAGNOSIS — Z794 Long term (current) use of insulin: Secondary | ICD-10-CM

## 2018-12-15 DIAGNOSIS — R3912 Poor urinary stream: Secondary | ICD-10-CM | POA: Diagnosis not present

## 2018-12-15 DIAGNOSIS — N401 Enlarged prostate with lower urinary tract symptoms: Secondary | ICD-10-CM | POA: Diagnosis not present

## 2018-12-15 DIAGNOSIS — I10 Essential (primary) hypertension: Secondary | ICD-10-CM

## 2018-12-15 DIAGNOSIS — E11630 Type 2 diabetes mellitus with periodontal disease: Secondary | ICD-10-CM | POA: Diagnosis not present

## 2018-12-15 LAB — POCT GLYCOSYLATED HEMOGLOBIN (HGB A1C): Hemoglobin A1C: 6.5 % — AB (ref 4.0–5.6)

## 2018-12-15 MED ORDER — TAMSULOSIN HCL 0.4 MG PO CAPS: 0.4000 mg | ORAL_CAPSULE | Freq: Every day | ORAL | 5 refills | Status: DC

## 2018-12-15 NOTE — Progress Notes (Signed)
Subjective:    CC: DM  HPI:  Diabetes - no hypoglycemic events. No wounds or sores that are not healing well. No increased thirst or urination. Checking glucose at home. Taking medications as prescribed without any side effects.  Hypertension- Pt denies chest pain, SOB, dizziness, or heart palpitations.  Taking meds as directed w/o problems.  Denies medication side effects.    Tobacco abuse-he currently smokes a pack a day.  He would really like to quit he says it just been such a struggle.  He says he has tried nicotine gum he is tried the vaping.  He is even tried acupuncture at one point in time.  He has actually never tried nicotine replacement patches.  He is a 68 year old male with a history of benign prostatic hypertrophy.  He is reporting that he is noticing more often weakened urinary stream.  Past medical history, Surgical history, Family history not pertinant except as noted below, Social history, Allergies, and medications have been entered into the medical record, reviewed, and corrections made.   Review of Systems: No fevers, chills, night sweats, weight loss, chest pain, or shortness of breath.   Objective:    General: Well Developed, well nourished, and in no acute distress.  Neuro: Alert and oriented x3, extra-ocular muscles intact, sensation grossly intact.  HEENT: Normocephalic, atraumatic  Skin: Warm and dry, no rashes. Cardiac: Regular rate and rhythm, no murmurs rubs or gallops, no lower extremity edema.  Respiratory: Clear to auscultation bilaterally. Not using accessory muscles, speaking in full sentences.   Impression and Recommendations:    DM -trolled with hemoglobin A1c of 6.5 today looks absolutely fantastic we will continue to monitor.  Follow-up in 4 months.  HTN -well controlled we will continue to monitor.  Tobacco abuse-discussed may be a trial of nicotine patches.  They are over-the-counter but could also check with his insurance they might  actually be covered if they are more than happy to send them as a prescription.  BPH with weak stream -we discussed options.  We will put him on a trial of Flomax.

## 2018-12-17 DIAGNOSIS — R69 Illness, unspecified: Secondary | ICD-10-CM | POA: Diagnosis not present

## 2018-12-17 DIAGNOSIS — G4710 Hypersomnia, unspecified: Secondary | ICD-10-CM | POA: Diagnosis not present

## 2018-12-17 DIAGNOSIS — G4733 Obstructive sleep apnea (adult) (pediatric): Secondary | ICD-10-CM | POA: Diagnosis not present

## 2018-12-17 DIAGNOSIS — Z9989 Dependence on other enabling machines and devices: Secondary | ICD-10-CM | POA: Diagnosis not present

## 2018-12-25 ENCOUNTER — Telehealth (HOSPITAL_COMMUNITY): Payer: Self-pay

## 2018-12-25 MED ORDER — ALPRAZOLAM 0.5 MG PO TABS: 0.5000 mg | ORAL_TABLET | Freq: Every day | ORAL | 0 refills | Status: DC | PRN

## 2018-12-25 NOTE — Telephone Encounter (Signed)
Patient called requesting a refill on Xanax. [facility] in [place].

## 2018-12-25 NOTE — Telephone Encounter (Signed)
Just sent

## 2018-12-27 DIAGNOSIS — G4733 Obstructive sleep apnea (adult) (pediatric): Secondary | ICD-10-CM | POA: Diagnosis not present

## 2019-01-06 ENCOUNTER — Telehealth (HOSPITAL_COMMUNITY): Payer: Self-pay

## 2019-01-06 NOTE — Telephone Encounter (Signed)
Patient states he has done some research on Rexulti and wants to know if it would be a good medication for him to try. Please review and advise.

## 2019-01-06 NOTE — Telephone Encounter (Signed)
meds can be discussed next visit, multiple factors play role . He can make earlier visit or wait till next appointment

## 2019-01-06 NOTE — Telephone Encounter (Signed)
Informed patient that per Dr. Gilmore Laroche  meds need to be discussed at next office visit. Patient scheduled appt for next week. Nothing else needed at this time.

## 2019-01-14 ENCOUNTER — Other Ambulatory Visit (HOSPITAL_COMMUNITY): Payer: Self-pay

## 2019-01-14 MED ORDER — PAROXETINE HCL 40 MG PO TABS: 40.0000 mg | ORAL_TABLET | Freq: Every day | ORAL | 0 refills | Status: DC

## 2019-01-15 ENCOUNTER — Other Ambulatory Visit: Payer: Self-pay

## 2019-01-15 ENCOUNTER — Encounter (HOSPITAL_COMMUNITY): Payer: Self-pay | Admitting: Psychiatry

## 2019-01-15 ENCOUNTER — Ambulatory Visit (INDEPENDENT_AMBULATORY_CARE_PROVIDER_SITE_OTHER): Payer: Medicare HMO | Admitting: Psychiatry

## 2019-01-15 VITALS — BP 140/62 | HR 89 | Ht 66.0 in | Wt 198.0 lb

## 2019-01-15 DIAGNOSIS — R69 Illness, unspecified: Secondary | ICD-10-CM | POA: Diagnosis not present

## 2019-01-15 DIAGNOSIS — F410 Panic disorder [episodic paroxysmal anxiety] without agoraphobia: Secondary | ICD-10-CM | POA: Diagnosis not present

## 2019-01-15 DIAGNOSIS — F331 Major depressive disorder, recurrent, moderate: Secondary | ICD-10-CM

## 2019-01-15 DIAGNOSIS — F17208 Nicotine dependence, unspecified, with other nicotine-induced disorders: Secondary | ICD-10-CM | POA: Diagnosis not present

## 2019-01-15 DIAGNOSIS — F411 Generalized anxiety disorder: Secondary | ICD-10-CM | POA: Diagnosis not present

## 2019-01-15 MED ORDER — BREXPIPRAZOLE 0.5 MG PO TABS: 0.5000 mg | ORAL_TABLET | Freq: Two times a day (BID) | ORAL | 0 refills | Status: DC

## 2019-01-15 NOTE — Progress Notes (Signed)
[facility] Outpatient Follow up visit   Patient Identification: Gabriel Gilbert MRN:  [id number] Date of Evaluation:  01/15/2019 Referral Source: primary care.  Chief Complaint:   Chief Complaint    Follow-up; Other     Visit Diagnosis:    ICD-10-CM   1. Major depressive disorder, recurrent episode, moderate (HCC) F33.1   2. GAD (generalized anxiety disorder) F41.1   3. Panic disorder F41.0   4. Nicotine dependence with other nicotine-induced disorder, unspecified nicotine product type F17.208     History of Present Illness:  68 years old single white male. Referred for management of depression  Patient comes in for an earlier visit wanted to discuss medication options he has heard of Rexulti.  Talked about side effects and also effect on depression.  He is taking Xanax on a regular basis still feels lonely tries to go to [employer] and visit friends but sometimes he does not like the topics that he discussed feels subdued.   Denies using alcohol or drugs Panic attacks sporadic around people  Explained risk of not being on higher dose of Xanax and talked about forgetfulness and addiction   Has applied bankrupcy  Aggravating factor:lonliness  ,modifying factor: dog. Some friends.  Timing in morning   Past Psychiatric History: depression ,anxiety  Previous Psychotropic Medications: Yes   Substance Abuse History in the last 12 months:  No.  Consequences of Substance Abuse: NA  Past Medical History:  Past Medical History:  Diagnosis Date  . Bell's palsy 12-10  . Depression   . Diabetes mellitus    type 2  . Hyperlipidemia   . Hypertension     Past Surgical History:  Procedure Laterality Date  . CATARACT EXTRACTION Left   . kidney stone removed    . pfts     normal    Family Psychiatric History: anxiety. Mom used valium  Family History:  Family History  Problem Relation Age of Onset  . COPD Mother   . Hypertension Mother   . Alzheimer's disease Mother         ?  . Diabetes Father   . COPD Father   . Drug abuse Son   . COPD Sister   . Cancer Sister        lung    Social History:   Social History   Socioeconomic History  . Marital status: Single    Spouse name: Not on file  . Number of children: Not on file  . Years of education: Not on file  . Highest education level: Not on file  Occupational History  . Not on file  Social Needs  . Financial resource strain: Not on file  . Food insecurity:    Worry: Not on file    Inability: Not on file  . Transportation needs:    Medical: Not on file    Non-medical: Not on file  Tobacco Use  . Smoking status: Current Every Day Smoker    Packs/day: 1.00    Years: 25.00    Pack years: 25.00    Types: Cigarettes    Last attempt to quit: 02/10/2013    Years since quitting: 5.9  . Smokeless tobacco: Never Used  . Tobacco comment: using electronic cigarettes  Substance and Sexual Activity  . Alcohol use: No    Alcohol/week: 0.0 standard drinks  . Drug use: No  . Sexual activity: Never    Partners: Female  Lifestyle  . Physical activity:    Days per week: Not  on file    Minutes per session: Not on file  . Stress: Not on file  Relationships  . Social connections:    Talks on phone: Not on file    Gets together: Not on file    Attends religious service: Not on file    Active member of club or organization: Not on file    Attends meetings of clubs or organizations: Not on file    Relationship status: Not on file  Other Topics Concern  . Not on file  Social History Narrative  . Not on file      Allergies:  No Known Allergies  Metabolic Disorder Labs: Lab Results  Component Value Date   HGBA1C 6.5 (A) 12/15/2018   No results found for: PROLACTIN Lab Results  Component Value Date   CHOL 163 05/16/2018   TRIG 242 (H) 05/16/2018   HDL 39 (L) 05/16/2018   CHOLHDL 4.2 05/16/2018   VLDL 22 01/17/2016   LDLCALC 90 05/16/2018   LDLCALC 79 01/17/2016     Current  Medications: Current Outpatient Medications  Medication Sig Dispense Refill  . ALPRAZolam (XANAX) 0.5 MG tablet Take 1 tablet (0.5 mg total) by mouth daily as needed for anxiety. 30 tablet 0  . aspirin 81 MG tablet Take 81 mg by mouth daily.      Marland Kitchen atorvastatin (LIPITOR) 40 MG tablet Take 1 tablet (40 mg total) by mouth at bedtime. 90 tablet 3  . B-D UF III MINI PEN NEEDLES 31G X 5 MM MISC USE ONCE DAILY 100 each 11  . glucose blood (ONE TOUCH TEST STRIPS) test strip Pt testing one time a day. 100 each 3  . Insulin Glargine (BASAGLAR KWIKPEN) 100 UNIT/ML SOPN Inject 0.38 mLs (38 Units total) into the skin at bedtime. 15 mL 3  . JARDIANCE 10 MG TABS tablet TAKE ONE TABLET BY MOUTH DAILY 30 tablet 4  . lisinopril (PRINIVIL,ZESTRIL) 10 MG tablet TAKE ONE TABLET BY MOUTH DAILY 90 tablet 0  . metFORMIN (GLUCOPHAGE) 1000 MG tablet TAKE ONE TABLET BY MOUTH TWICE A DAY WITH FOOD 180 tablet 0  . Misc. Devices MISC New CPAP machine with mask and supplies through [facility].  CPAP therapy at 14 cm. water pressure.    . ONE TOUCH ULTRA TEST test strip TEST as directed 100 each 3  . PARoxetine (PAXIL) 40 MG tablet Take 1 tablet (40 mg total) by mouth daily. 30 tablet 0  . tamsulosin (FLOMAX) 0.4 MG CAPS capsule Take 1 capsule (0.4 mg total) by mouth daily after supper. 30 capsule 5  . Brexpiprazole (REXULTI) 0.5 MG TABS Take 1 tablet (0.5 mg total) by mouth 2 (two) times daily. Start with one a day for one week then one twice a day 60 tablet 0   No current facility-administered medications for this visit.       Psychiatric Specialty Exam: Review of Systems  Cardiovascular: Negative for palpitations.  Skin: Negative for rash.  Psychiatric/Behavioral: Positive for depression. Negative for suicidal ideas.    Blood pressure 140/62, pulse 89, height 5\' 6"  (1.676 m), weight 198 lb (89.8 kg).Body mass index is 31.96 kg/m.  General Appearance: Casual  Eye Contact:  Fair  Speech:  Slow  Volume:   Decreased  Mood: subdued  Affect:  congruent  Thought Process:  Goal Directed  Orientation:  Full (Time, Place, and Person)  Thought Content:  Rumination  Suicidal Thoughts:  No  Homicidal Thoughts:  No  Memory:  Immediate;  Fair Recent;   Fair  Judgement:  Poor  Insight:  Shallow  Psychomotor Activity:  Decreased  Concentration:  Concentration: Fair and Attention Span: Fair  Recall:  [employer] of Knowledge:Fair  Language: Fair  Akathisia:  Negative  Handed:  Right  AIMS (if indicated):  0  Assets:  Desire for Improvement  ADL's:  Intact  Cognition: WNL  Sleep:  fair    Treatment Plan Summary: Medication management and Plan as follows   1. Major depression moderate recurrent: feeling subdued. Start rexulti small dose 0.5mg  increase to bid in one week, cut down paxil to half dose and then can discontinue . Call back in one week let us know status 2. GAD fluctuates, takex prn xanax, cut down paxil has paxil. Will start half dose today. Will start rexulti 3. Panic attacks:sporadic, on xanax, avoid nicotine 4. Nicotine dependence: now taking e cig. Working on cutting down Fu 65m or earlier if needed, call in one week   Thresa Ross, MD 3/5/20201:54 PM

## 2019-01-17 ENCOUNTER — Other Ambulatory Visit: Payer: Self-pay | Admitting: Family Medicine

## 2019-01-22 ENCOUNTER — Telehealth (HOSPITAL_COMMUNITY): Payer: Self-pay

## 2019-01-22 MED ORDER — ALPRAZOLAM 0.5 MG PO TABS: 0.5000 mg | ORAL_TABLET | Freq: Every day | ORAL | 0 refills | Status: DC | PRN

## 2019-01-22 NOTE — Telephone Encounter (Signed)
Patient called to request a refill on Xanax. Last refill was on 12/25/18. [facility] in [place].

## 2019-01-22 NOTE — Telephone Encounter (Signed)
Sent xanax

## 2019-01-25 DIAGNOSIS — G4733 Obstructive sleep apnea (adult) (pediatric): Secondary | ICD-10-CM | POA: Diagnosis not present

## 2019-02-12 ENCOUNTER — Ambulatory Visit (HOSPITAL_COMMUNITY): Payer: Medicare HMO | Admitting: Psychiatry

## 2019-02-20 ENCOUNTER — Telehealth (HOSPITAL_COMMUNITY): Payer: Self-pay

## 2019-02-20 MED ORDER — ALPRAZOLAM 0.5 MG PO TABS: 0.5000 mg | ORAL_TABLET | Freq: Every day | ORAL | 0 refills | Status: DC | PRN

## 2019-02-20 NOTE — Telephone Encounter (Signed)
Patient needs refill on Alprazolam. Last refill was on 01/22/19. [facility] in [place].

## 2019-02-20 NOTE — Telephone Encounter (Signed)
sent 

## 2019-02-25 DIAGNOSIS — G4733 Obstructive sleep apnea (adult) (pediatric): Secondary | ICD-10-CM | POA: Diagnosis not present

## 2019-02-27 ENCOUNTER — Other Ambulatory Visit: Payer: Self-pay | Admitting: Family Medicine

## 2019-03-09 ENCOUNTER — Other Ambulatory Visit: Payer: Self-pay | Admitting: Family Medicine

## 2019-03-09 ENCOUNTER — Other Ambulatory Visit (HOSPITAL_COMMUNITY): Payer: Self-pay | Admitting: Psychiatry

## 2019-03-09 ENCOUNTER — Ambulatory Visit (HOSPITAL_COMMUNITY): Payer: Medicare HMO | Admitting: Psychiatry

## 2019-03-10 ENCOUNTER — Encounter (HOSPITAL_COMMUNITY): Payer: Self-pay | Admitting: Psychiatry

## 2019-03-10 ENCOUNTER — Ambulatory Visit (INDEPENDENT_AMBULATORY_CARE_PROVIDER_SITE_OTHER): Payer: Medicare HMO | Admitting: Psychiatry

## 2019-03-10 DIAGNOSIS — F411 Generalized anxiety disorder: Secondary | ICD-10-CM

## 2019-03-10 DIAGNOSIS — F331 Major depressive disorder, recurrent, moderate: Secondary | ICD-10-CM | POA: Diagnosis not present

## 2019-03-10 DIAGNOSIS — F410 Panic disorder [episodic paroxysmal anxiety] without agoraphobia: Secondary | ICD-10-CM

## 2019-03-10 DIAGNOSIS — R69 Illness, unspecified: Secondary | ICD-10-CM | POA: Diagnosis not present

## 2019-03-10 NOTE — Progress Notes (Signed)
[facility] Outpatient Follow up visit  Telepsych Patient Identification: Gabriel Gilbert MRN:  [id number] Date of Evaluation:  03/10/2019 Referral Source: primary care.  Chief Complaint:    Visit Diagnosis:  No diagnosis found.  History of Present Illness:  68 years old single white male. Referred initially for management of depression I connected with [name] on 03/10/19 at 10:00 AM EDT by telephone and verified that I am speaking with the correct person using two identifiers.   I discussed the limitations, risks, security and privacy concerns of performing an evaluation and management service by telephone and the availability of in person appointments. I also discussed with the patient that there may be a patient responsible charge related to this service. The patient expressed understanding and agreed to proceed.   He was changed to rexulti last visit but says coudnt afford or insurance didn't cover so kept taking paxil. Doing fair. Visits [employer] keep himself busy has few friends takes xanax prn   Denies using alcohol or drugs Panic attacks sporadic around people  Explained risk of not being on higher dose of Xanax and talked about forgetfulness and addiction Says xanax keeps him going and helps anxiety and not worried about memory concerns if any.   Has applied bankrupcy  Aggravating factor:lonliness  ,modifying factor: dog. Some friends.  Timing in morning   Past Psychiatric History: depression ,anxiety  Previous Psychotropic Medications: Yes   Substance Abuse History in the last 12 months:  No.  Consequences of Substance Abuse: NA  Past Medical History:  Past Medical History:  Diagnosis Date  . Bell's palsy 12-10  . Depression   . Diabetes mellitus    type 2  . Hyperlipidemia   . Hypertension     Past Surgical History:  Procedure Laterality Date  . CATARACT EXTRACTION Left   . kidney stone removed    . pfts     normal    Family Psychiatric  History: anxiety. Mom used valium  Family History:  Family History  Problem Relation Age of Onset  . COPD Mother   . Hypertension Mother   . Alzheimer's disease Mother        ?  . Diabetes Father   . COPD Father   . Drug abuse Son   . COPD Sister   . Cancer Sister        lung    Social History:   Social History   Socioeconomic History  . Marital status: Single    Spouse name: Not on file  . Number of children: Not on file  . Years of education: Not on file  . Highest education level: Not on file  Occupational History  . Not on file  Social Needs  . Financial resource strain: Not on file  . Food insecurity:    Worry: Not on file    Inability: Not on file  . Transportation needs:    Medical: Not on file    Non-medical: Not on file  Tobacco Use  . Smoking status: Current Every Day Smoker    Packs/day: 1.00    Years: 25.00    Pack years: 25.00    Types: Cigarettes    Last attempt to quit: 02/10/2013    Years since quitting: 6.0  . Smokeless tobacco: Never Used  . Tobacco comment: using electronic cigarettes  Substance and Sexual Activity  . Alcohol use: No    Alcohol/week: 0.0 standard drinks  . Drug use: No  . Sexual activity:  Never    Partners: Female  Lifestyle  . Physical activity:    Days per week: Not on file    Minutes per session: Not on file  . Stress: Not on file  Relationships  . Social connections:    Talks on phone: Not on file    Gets together: Not on file    Attends religious service: Not on file    Active member of club or organization: Not on file    Attends meetings of clubs or organizations: Not on file    Relationship status: Not on file  Other Topics Concern  . Not on file  Social History Narrative  . Not on file      Allergies:  No Known Allergies  Metabolic Disorder Labs: Lab Results  Component Value Date   HGBA1C 6.5 (A) 12/15/2018   No results found for: PROLACTIN Lab Results  Component Value Date   CHOL 163  05/16/2018   TRIG 242 (H) 05/16/2018   HDL 39 (L) 05/16/2018   CHOLHDL 4.2 05/16/2018   VLDL 22 01/17/2016   LDLCALC 90 05/16/2018   LDLCALC 79 01/17/2016     Current Medications: Current Outpatient Medications  Medication Sig Dispense Refill  . ALPRAZolam (XANAX) 0.5 MG tablet Take 1 tablet (0.5 mg total) by mouth daily as needed for anxiety. 30 tablet 0  . aspirin 81 MG tablet Take 81 mg by mouth daily.      Marland Kitchen. atorvastatin (LIPITOR) 40 MG tablet TAKE ONE TABLET BY MOUTH EVERY NIGHT AT BEDTIME 90 tablet 2  . B-D UF III MINI PEN NEEDLES 31G X 5 MM MISC USE ONCE DAILY 100 each 11  . glucose blood (ONE TOUCH TEST STRIPS) test strip Pt testing one time a day. 100 each 3  . Insulin Glargine (BASAGLAR KWIKPEN) 100 UNIT/ML SOPN Inject 0.38 mLs (38 Units total) into the skin at bedtime. 15 mL 3  . JARDIANCE 10 MG TABS tablet TAKE ONE TABLET BY MOUTH DAILY 30 tablet 4  . lisinopril (ZESTRIL) 10 MG tablet TAKE ONE TABLET BY MOUTH DAILY 90 tablet 0  . metFORMIN (GLUCOPHAGE) 1000 MG tablet TAKE ONE TABLET BY MOUTH TWICE A DAY WITH FOOD 180 tablet 1  . Misc. Devices MISC New CPAP machine with mask and supplies through [facility].  CPAP therapy at 14 cm. water pressure.    . ONE TOUCH ULTRA TEST test strip TEST as directed 100 each 3  . PARoxetine (PAXIL) 40 MG tablet TAKE ONE TABLET BY MOUTH DAILY 30 tablet 0  . tamsulosin (FLOMAX) 0.4 MG CAPS capsule Take 1 capsule (0.4 mg total) by mouth daily after supper. 30 capsule 5   No current facility-administered medications for this visit.       Psychiatric Specialty Exam: Review of Systems  Cardiovascular: Negative for chest pain.  Skin: Negative for rash.  Psychiatric/Behavioral: Negative for suicidal ideas.    There were no vitals taken for this visit.There is no height or weight on file to calculate BMI.  General Appearance:   Eye Contact:    Speech:  Slow  Volume:  Decreased  Mood: not worse. Somewhat subdued  Affect:   congruent  Thought Process:  Goal Directed  Orientation:  Full (Time, Place, and Person)  Thought Content:  Rumination  Suicidal Thoughts:  No  Homicidal Thoughts:  No  Memory:  Immediate;   Fair Recent;   Fair  Judgement:  Poor  Insight:  Shallow  Psychomotor Activity:  Decreased  Concentration:  Concentration: Fair and Attention Span: Fair  Recall:  [employer] of Knowledge:Fair  Language: Fair  Akathisia:  Negative  Handed:  Right  AIMS (if indicated):  0  Assets:  Desire for Improvement  ADL's:  Intact  Cognition: WNL  Sleep:  fair    Treatment Plan Summary: Medication management and Plan as follows   1. Major depression moderate recurrent: somewhat subdued, paxil helps . Tries to keep busy. Got refill today Not on rexulti 3. Panic attacks:sporadic, on xanax, avoid nicotine 4. Nicotine dependence: now taking e cig. Working on cutting down Call back for refills which he usually does or prefers. I discussed the assessment and treatment plan with the patient. The patient was provided an opportunity to ask questions and all were answered. The patient agreed with the plan and demonstrated an understanding of the instructions.   The patient was advised to call back or seek an in-person evaluation if the symptoms worsen or if the condition fails to improve as anticipated.  I provided 15 minutes of non-face-to-face time during this encounter. Fu 2-31m.   Thresa Ross, MD 4/28/202010:07 AM

## 2019-03-17 DIAGNOSIS — G4733 Obstructive sleep apnea (adult) (pediatric): Secondary | ICD-10-CM | POA: Diagnosis not present

## 2019-03-17 DIAGNOSIS — R69 Illness, unspecified: Secondary | ICD-10-CM | POA: Diagnosis not present

## 2019-03-17 DIAGNOSIS — Z9989 Dependence on other enabling machines and devices: Secondary | ICD-10-CM | POA: Diagnosis not present

## 2019-03-18 ENCOUNTER — Other Ambulatory Visit: Payer: Self-pay | Admitting: Family Medicine

## 2019-03-20 ENCOUNTER — Telehealth (HOSPITAL_COMMUNITY): Payer: Self-pay

## 2019-03-20 MED ORDER — ALPRAZOLAM 0.5 MG PO TABS: 0.5000 mg | ORAL_TABLET | Freq: Every day | ORAL | 0 refills | Status: DC | PRN

## 2019-03-20 NOTE — Telephone Encounter (Signed)
Patient called requesting refill on Xanax. Last refill on 02/20/19. [facility] in [place]

## 2019-03-20 NOTE — Telephone Encounter (Signed)
Xanax refill sent

## 2019-03-26 ENCOUNTER — Other Ambulatory Visit: Payer: Self-pay

## 2019-03-26 MED ORDER — EMPAGLIFLOZIN 10 MG PO TABS: 10.0000 mg | ORAL_TABLET | Freq: Every day | ORAL | 4 refills | Status: DC

## 2019-03-26 NOTE — Telephone Encounter (Signed)
Patient called requesting RF be sent to [facility] for his Jardiance.   Last RX sent was 05/12/18, but patient states he has received free samples from Dr [name] in the interim.   RX pended.. ok to send?

## 2019-03-26 NOTE — Telephone Encounter (Signed)
Pt advised RX sent.

## 2019-03-27 DIAGNOSIS — G4733 Obstructive sleep apnea (adult) (pediatric): Secondary | ICD-10-CM | POA: Diagnosis not present

## 2019-04-13 ENCOUNTER — Other Ambulatory Visit: Payer: Self-pay | Admitting: Family Medicine

## 2019-04-14 ENCOUNTER — Other Ambulatory Visit: Payer: Self-pay

## 2019-04-14 MED ORDER — BASAGLAR KWIKPEN 100 UNIT/ML ~~LOC~~ SOPN: 38.0000 [IU] | PEN_INJECTOR | Freq: Every day | SUBCUTANEOUS | 0 refills | Status: DC

## 2019-04-16 ENCOUNTER — Ambulatory Visit: Payer: Self-pay | Admitting: Family Medicine

## 2019-04-17 ENCOUNTER — Telehealth (HOSPITAL_COMMUNITY): Payer: Self-pay

## 2019-04-17 MED ORDER — ALPRAZOLAM 0.5 MG PO TABS: 0.5000 mg | ORAL_TABLET | Freq: Every day | ORAL | 0 refills | Status: DC | PRN

## 2019-04-17 NOTE — Telephone Encounter (Signed)
Patient needs refill on Alprazolam sent to [facility] in [place]

## 2019-04-17 NOTE — Telephone Encounter (Signed)
sent 

## 2019-04-20 DIAGNOSIS — R69 Illness, unspecified: Secondary | ICD-10-CM | POA: Diagnosis not present

## 2019-04-20 DIAGNOSIS — Z122 Encounter for screening for malignant neoplasm of respiratory organs: Secondary | ICD-10-CM | POA: Diagnosis not present

## 2019-04-23 ENCOUNTER — Other Ambulatory Visit (HOSPITAL_COMMUNITY): Payer: Self-pay | Admitting: Psychiatry

## 2019-04-24 ENCOUNTER — Encounter: Payer: Self-pay | Admitting: Family Medicine

## 2019-04-24 ENCOUNTER — Ambulatory Visit (INDEPENDENT_AMBULATORY_CARE_PROVIDER_SITE_OTHER): Payer: Medicare HMO | Admitting: Family Medicine

## 2019-04-24 VITALS — BP 121/70 | HR 79 | Ht 66.0 in | Wt 189.0 lb

## 2019-04-24 DIAGNOSIS — I10 Essential (primary) hypertension: Secondary | ICD-10-CM | POA: Diagnosis not present

## 2019-04-24 DIAGNOSIS — G4733 Obstructive sleep apnea (adult) (pediatric): Secondary | ICD-10-CM

## 2019-04-24 DIAGNOSIS — Z9989 Dependence on other enabling machines and devices: Secondary | ICD-10-CM

## 2019-04-24 DIAGNOSIS — R911 Solitary pulmonary nodule: Secondary | ICD-10-CM

## 2019-04-24 DIAGNOSIS — E11630 Type 2 diabetes mellitus with periodontal disease: Secondary | ICD-10-CM

## 2019-04-24 DIAGNOSIS — Z794 Long term (current) use of insulin: Secondary | ICD-10-CM | POA: Diagnosis not present

## 2019-04-24 NOTE — Assessment & Plan Note (Signed)
Plan repeat CT in 6 mo by Pulm at [facility]

## 2019-04-24 NOTE — Assessment & Plan Note (Signed)
A1c looks great today. Great job on [employer] loss!! Continue current regimen. F/U in 4 mo.

## 2019-04-24 NOTE — Progress Notes (Signed)
Established Patient Office Visit  Subjective:  Patient ID: Gabriel Gilbert, male    DOB: Jun 26, 1951  Age: 68 y.o. MRN: [id number]  CC:  Chief Complaint  Patient presents with  . Diabetes  . Hypertension    HPI Gabriel Gilbert presents for  Hypertension- Pt denies chest pain, SOB, dizziness, or heart palpitations.  Taking meds as directed w/o problems.  Denies medication side effects.    Diabetes - no hypoglycemic events. No wounds or sores that are not healing well. No increased thirst or urination. Not checking glucose at home. Taking medications as prescribed without any side effects. He has lost about 9 lbs since March.   F/U OSA - he is wearing his CPAP regularly.  He ahas been having some air leaks and has called for a new mask.   He was told by Pulmonology that he has a lung nodules. They are planning to repeat CT in 6 months.    CT Chest Lung Screening (g code) 04/21/2019 [facility] Result Impression  IMPRESSION: Small 4.5 mm pulmonary nodule in the left posterior costophrenic sulcus, possibly representing atelectasis. This is not identified on the prior study.  RECOMMENDATIONS: 6 month follow-up with low-dose chest CT (exam code IMG 2001).  Lung-RADSTM CATEGORY: Category 3, probably benign  Smoking cessation resources: 1-800-QUIT-NOW and smokefree.gov    Electronically Signed by: Andy GaussGarret Young  Result Narrative  TECHNIQUE: Noncontrast screening protocol chest CT was performed. Radiation dose reduction was utilized (automated exposure control, mA or kV adjustment based on patient size, or iterative image reconstruction). 3-D maximal intensity projection images  were constructed and reviewed, along with axial and 2-dimensional sagittal reformatted images. The data was post processed used DynaCAD Lung software for enhanced nodule analysis, comparison, and detection.  COMPARISON: CT lung screen 04/17/2018, chest x-ray 10/30/2006   INDICATION: 68 year old male current  smoker 45 pack-year history. Lung cancer annual screening, asymptomatic, current smoker (min. 30 pack-yrs)  Personal history of tobacco use/personal history of nicotine dependence? Yes  Please note that the nodule numbering system utilized in this report is based on DynaCAD Lung analysis and may not necessarily be sequential. In cases with numerous small nodules only the largest or most suspicious may be detailed.  FINDINGS:   Lungs and pleura:  Patent central airways. Interval clearing of debris in the trachea. Mild centrilobular emphysema. Stable mucous plug in the right lower lobe, series 2 image 250.Marland Kitchen. No consolidation. No pleural effusion or pneumothorax.  Mediastinum/Soft Tissues:  Minimal scattered coronary calcifications and mild aortic ASVD. No adenopathy.  Upper Abdomen: Grossly unremarkable allowing for low dose technique.   Bones: No acute or aggressive bony abnormality. Mild to moderate mid thoracic multilevel degenerative changes.  Nodule assessment: No suspicious pulmonary nodules.  Other Result Information  Acute Interface, Incoming Rad Results - 04/21/2019  7:46 AM EDT TECHNIQUE:  Noncontrast screening protocol chest CT was performed. Radiation dose reduction was utilized (automated exposure control, mA or kV adjustment based on patient size, or iterative image reconstruction). 3-D maximal intensity projection images  were constructed and reviewed, along with axial and 2-dimensional sagittal reformatted images. The data was post processed used DynaCAD Lung software for enhanced nodule analysis, comparison, and detection.  COMPARISON:  CT lung screen 04/17/2018, chest x-ray 10/30/2006   INDICATION: 68 year old male current smoker 45 pack-year history. Lung cancer annual screening, asymptomatic, current smoker (min. 30 pack-yrs)  Personal history of tobacco use/personal history of nicotine dependence? Yes  Please note that the nodule numbering system utilized in  this  report is based on DynaCAD Lung analysis and may not necessarily be sequential. In cases with numerous small nodules only the largest or most suspicious may be detailed.  FINDINGS:   Lungs and pleura:  Patent central airways. Interval clearing of debris in the trachea. Mild centrilobular emphysema. Stable mucous plug in the right lower lobe, series 2 image 250.Marland Kitchen. No consolidation. No pleural effusion or pneumothorax.  Mediastinum/Soft Tissues:  Minimal scattered coronary calcifications and mild aortic ASVD. No adenopathy.  Upper Abdomen: Grossly unremarkable allowing for low dose technique.   Bones: No acute or aggressive bony abnormality. Mild to moderate mid thoracic multilevel degenerative changes.  Nodule assessment: No suspicious pulmonary nodules.   IMPRESSION: Small 4.5 mm pulmonary nodule in the left posterior costophrenic sulcus, possibly representing atelectasis. This is not identified on the prior study.  RECOMMENDATIONS: 6 month follow-up with low-dose chest CT (exam code IMG 2001).  Lung-RADSTM CATEGORY:  Category 3, probably benign  Smoking cessation resources: 1-800-QUIT-NOW and smokefree.gov    Electronically Signed by: Andy GaussGarret Young  Status Results Details     Past Medical History:  Diagnosis Date  . Bell's palsy 12-10  . Depression   . Diabetes mellitus    type 2  . Hyperlipidemia   . Hypertension     Past Surgical History:  Procedure Laterality Date  . CATARACT EXTRACTION Left   . kidney stone removed    . pfts     normal    Family History  Problem Relation Age of Onset  . COPD Mother   . Hypertension Mother   . Alzheimer's disease Mother        ?  . Diabetes Father   . COPD Father   . Drug abuse Son   . COPD Sister   . Cancer Sister        lung    Social History   Socioeconomic History  . Marital status: Single    Spouse name: Not on file  . Number of children: Not on file  . Years of education: Not on file  . Highest  education level: Not on file  Occupational History  . Not on file  Social Needs  . Financial resource strain: Not on file  . Food insecurity    Worry: Not on file    Inability: Not on file  . Transportation needs    Medical: Not on file    Non-medical: Not on file  Tobacco Use  . Smoking status: Current Every Day Smoker    Packs/day: 1.00    Years: 25.00    Pack years: 25.00    Types: Cigarettes    Last attempt to quit: 02/10/2013    Years since quitting: 6.2  . Smokeless tobacco: Never Used  . Tobacco comment: using electronic cigarettes  Substance and Sexual Activity  . Alcohol use: No    Alcohol/week: 0.0 standard drinks  . Drug use: No  . Sexual activity: Never    Partners: Female  Lifestyle  . Physical activity    Days per week: Not on file    Minutes per session: Not on file  . Stress: Not on file  Relationships  . Social Musicianconnections    Talks on phone: Not on file    Gets together: Not on file    Attends religious service: Not on file    Active member of club or organization: Not on file    Attends meetings of clubs or organizations: Not on file  Relationship status: Not on file  . Intimate partner violence    Fear of current or ex partner: Not on file    Emotionally abused: Not on file    Physically abused: Not on file    Forced sexual activity: Not on file  Other Topics Concern  . Not on file  Social History Narrative  . Not on file    Outpatient Medications Prior to Visit  Medication Sig Dispense Refill  . ALPRAZolam (XANAX) 0.5 MG tablet Take 1 tablet (0.5 mg total) by mouth daily as needed for anxiety. 30 tablet 0  . aspirin 81 MG tablet Take 81 mg by mouth daily.      Marland Kitchen atorvastatin (LIPITOR) 40 MG tablet TAKE ONE TABLET BY MOUTH EVERY NIGHT AT BEDTIME 90 tablet 2  . B-D UF III MINI PEN NEEDLES 31G X 5 MM MISC USE ONCE DAILY 100 each 10  . empagliflozin (JARDIANCE) 10 MG TABS tablet Take 10 mg by mouth daily. 30 tablet 4  . glucose blood (ONE  TOUCH TEST STRIPS) test strip Pt testing one time a day. 100 each 3  . Insulin Glargine (BASAGLAR KWIKPEN) 100 UNIT/ML SOPN Inject 0.38 mLs (38 Units total) into the skin at bedtime. 15 mL 0  . lisinopril (ZESTRIL) 10 MG tablet TAKE ONE TABLET BY MOUTH DAILY 90 tablet 0  . metFORMIN (GLUCOPHAGE) 1000 MG tablet TAKE ONE TABLET BY MOUTH TWICE A DAY WITH FOOD 180 tablet 1  . Misc. Devices MISC New CPAP machine with mask and supplies through [place].  CPAP therapy at 14 cm. water pressure.    . ONE TOUCH ULTRA TEST test strip TEST as directed 100 each 3  . PARoxetine (PAXIL) 40 MG tablet TAKE ONE TABLET BY MOUTH DAILY 30 tablet 0  . tamsulosin (FLOMAX) 0.4 MG CAPS capsule Take 1 capsule (0.4 mg total) by mouth daily after supper. 30 capsule 5   No facility-administered medications prior to visit.     No Known Allergies  ROS Review of Systems    Objective:    Physical Exam  BP 121/70   Pulse 79   Ht 5\' 6"  (1.676 m)   Wt 189 lb (85.7 kg)   SpO2 97%   BMI 30.51 kg/m  Wt Readings from Last 3 Encounters:  04/24/19 189 lb (85.7 kg)  01/15/19 198 lb (89.8 kg)  12/15/18 197 lb (89.4 kg)     Health Maintenance Due  Topic Date Due  . Hepatitis C Screening  [birth year]  . COLONOSCOPY  04/25/2016  . PNA vac Low Risk Adult (2 of 2 - PPSV23) 02/05/2019    There are no preventive care reminders to display for this patient.  Lab Results  Component Value Date   TSH 1.580 01/25/2015   Lab Results  Component Value Date   WBC 8.8 01/17/2016   HGB 16.6 01/17/2016   HCT 46.9 01/17/2016   MCV 89.3 01/17/2016   PLT 338 01/17/2016   Lab Results  Component Value Date   NA 137 08/13/2018   K 4.4 08/13/2018   CO2 27 08/13/2018   GLUCOSE 144 (H) 08/13/2018   BUN 18 08/13/2018   CREATININE 1.04 08/13/2018   BILITOT 0.5 08/13/2018   ALKPHOS 61 06/06/2017   AST 16 08/13/2018   ALT 13 08/13/2018   PROT 6.3 08/13/2018   ALBUMIN 4.1 06/06/2017   CALCIUM 9.5 08/13/2018    Lab Results  Component Value Date   CHOL 163 05/16/2018   Lab Results  Component Value Date   HDL 39 (L) 05/16/2018   Lab Results  Component Value Date   LDLCALC 90 05/16/2018   Lab Results  Component Value Date   TRIG 242 (H) 05/16/2018   Lab Results  Component Value Date   CHOLHDL 4.2 05/16/2018   Lab Results  Component Value Date   HGBA1C 6.5 (A) 12/15/2018      Assessment & Plan:   Problem List Items Addressed This Visit      Cardiovascular and Mediastinum   Essential hypertension, benign - Primary    Well controlled. Continue current regimen. Follow up in 6 months.          Respiratory   OSA on CPAP    Well controlled. Continue current regimen. Follow up in  12 months.         Endocrine   Type 2 diabetes mellitus with periodontal complication (HCC)    A1c looks great today. Great job on [employer] loss!! Continue current regimen. F/U in 4 mo.         Other   Pulmonary nodule, left    [facility]         Lab Results  Component Value Date   HGBA1C 6.5 (A) 12/15/2018    No orders of the defined types were placed in this encounter.   Follow-up: Return in about 4 months (around 08/24/2019) for Diabetes follow-up.    Nani Gasseratherine Metheney, MD

## 2019-04-24 NOTE — Assessment & Plan Note (Signed)
Well controlled. Continue current regimen. Follow up in  12 months.  

## 2019-04-24 NOTE — Assessment & Plan Note (Signed)
Well controlled. Continue current regimen. Follow up in  6 months.  

## 2019-04-25 LAB — BASIC METABOLIC PANEL WITH GFR
BUN: 17 mg/dL (ref 7–25)
CO2: 23 mmol/L (ref 20–32)
Calcium: 9.9 mg/dL (ref 8.6–10.3)
Chloride: 104 mmol/L (ref 98–110)
Creat: 0.9 mg/dL (ref 0.70–1.25)
GFR, Est African American: 103 mL/min/{1.73_m2} (ref >=60)
GFR, Est Non African American: 89 mL/min/{1.73_m2} (ref >=60)
Glucose, Bld: 82 mg/dL (ref 65–99)
Potassium: 4.4 mmol/L (ref 3.5–5.3)
Sodium: 140 mmol/L (ref 135–146)

## 2019-04-27 DIAGNOSIS — G4733 Obstructive sleep apnea (adult) (pediatric): Secondary | ICD-10-CM | POA: Diagnosis not present

## 2019-04-27 NOTE — Progress Notes (Signed)
All labs are normal. 

## 2019-05-18 ENCOUNTER — Telehealth (HOSPITAL_COMMUNITY): Payer: Self-pay

## 2019-05-18 MED ORDER — ALPRAZOLAM 0.5 MG PO TABS: 0.5000 mg | ORAL_TABLET | Freq: Every day | ORAL | 0 refills | Status: DC | PRN

## 2019-05-18 NOTE — Telephone Encounter (Signed)
sent 

## 2019-05-18 NOTE — Telephone Encounter (Signed)
Patient left vm requesting a refill on Alprazolam. [facility] in [place]

## 2019-05-18 NOTE — Addendum Note (Signed)
Addended by: Merian Capron on: 05/18/2019 09:59 AM   Modules accepted: Orders

## 2019-05-19 ENCOUNTER — Other Ambulatory Visit: Payer: Self-pay | Admitting: Family Medicine

## 2019-05-27 DIAGNOSIS — G4733 Obstructive sleep apnea (adult) (pediatric): Secondary | ICD-10-CM | POA: Diagnosis not present

## 2019-06-01 ENCOUNTER — Other Ambulatory Visit (HOSPITAL_COMMUNITY): Payer: Self-pay | Admitting: Psychiatry

## 2019-06-02 ENCOUNTER — Encounter (HOSPITAL_COMMUNITY): Payer: Self-pay | Admitting: Psychiatry

## 2019-06-02 ENCOUNTER — Ambulatory Visit (INDEPENDENT_AMBULATORY_CARE_PROVIDER_SITE_OTHER): Payer: Medicare HMO | Admitting: Psychiatry

## 2019-06-02 DIAGNOSIS — F331 Major depressive disorder, recurrent, moderate: Secondary | ICD-10-CM | POA: Diagnosis not present

## 2019-06-02 DIAGNOSIS — F411 Generalized anxiety disorder: Secondary | ICD-10-CM

## 2019-06-02 DIAGNOSIS — R69 Illness, unspecified: Secondary | ICD-10-CM | POA: Diagnosis not present

## 2019-06-02 DIAGNOSIS — F410 Panic disorder [episodic paroxysmal anxiety] without agoraphobia: Secondary | ICD-10-CM

## 2019-06-02 MED ORDER — PAROXETINE HCL 40 MG PO TABS: 40.0000 mg | ORAL_TABLET | Freq: Every day | ORAL | 2 refills | Status: DC

## 2019-06-02 NOTE — Progress Notes (Signed)
[facility] Outpatient Follow up visit  Telepsych Patient Identification: Gabriel Gilbert MRN:  [id number] Date of Evaluation:  06/02/2019 Referral Source: primary care.  Chief Complaint:   depression follow up  Visit Diagnosis:    ICD-10-CM   1. Major depressive disorder, recurrent episode, moderate (HCC)  F33.1   2. GAD (generalized anxiety disorder)  F41.1   3. Panic disorder  F41.0     History of Present Illness:  68  years old single white male. Referred initially for management of depression  I connected with Gabriel Gilbert on 06/02/19 at  3:30 PM EDT by telephone and verified that I am speaking with the correct person using two identifiers.  I discussed the limitations, risks, security and privacy concerns of performing an evaluation and management service by telephone and the availability of in person appointments. I also discussed with the patient that there may be a patient responsible charge related to this service. The patient expressed understanding and agreed to proceed.   Subdued but not worsel goes to [employer] but doesn't like the topic of discussion with the men there Ran low on paxil, hasnt called, will reinstate  Takes xanax regularly for anxiety  Denies using alcohol or drugs Panic attacks sporadic around people  Explained risk of not being on higher dose of Xanax and talked about forgetfulness and addiction Says xanax keeps him going and helps anxiety and not worried about memory concerns if any.   Aggravating factor:lonliness  ,modifying factor: dog. Some friends.  Timing in morning   Past Psychiatric History: depression ,anxiety  Previous Psychotropic Medications: Yes   Substance Abuse History in the last 12 months:  No.  Consequences of Substance Abuse: NA  Past Medical History:  Past Medical History:  Diagnosis Date  . Bell's palsy 12-10  . Depression   . Diabetes mellitus    type 2  . Hyperlipidemia   . Hypertension     Past Surgical  History:  Procedure Laterality Date  . CATARACT EXTRACTION Left   . kidney stone removed    . pfts     normal    Family Psychiatric History: anxiety. Mom used valium  Family History:  Family History  Problem Relation Age of Onset  . COPD Mother   . Hypertension Mother   . Alzheimer's disease Mother        ?  . Diabetes Father   . COPD Father   . Drug abuse Son   . COPD Sister   . Cancer Sister        lung    Social History:   Social History   Socioeconomic History  . Marital status: Single    Spouse name: Not on file  . Number of children: Not on file  . Years of education: Not on file  . Highest education level: Not on file  Occupational History  . Not on file  Social Needs  . Financial resource strain: Not on file  . Food insecurity    Worry: Not on file    Inability: Not on file  . Transportation needs    Medical: Not on file    Non-medical: Not on file  Tobacco Use  . Smoking status: Current Every Day Smoker    Packs/day: 1.00    Years: 25.00    Pack years: 25.00    Types: Cigarettes    Last attempt to quit: 02/10/2013    Years since quitting: 6.3  . Smokeless tobacco: Never Used  .  Tobacco comment: using electronic cigarettes  Substance and Sexual Activity  . Alcohol use: No    Alcohol/week: 0.0 standard drinks  . Drug use: No  . Sexual activity: Never    Partners: Female  Lifestyle  . Physical activity    Days per week: Not on file    Minutes per session: Not on file  . Stress: Not on file  Relationships  . Social Musicianconnections    Talks on phone: Not on file    Gets together: Not on file    Attends religious service: Not on file    Active member of club or organization: Not on file    Attends meetings of clubs or organizations: Not on file    Relationship status: Not on file  Other Topics Concern  . Not on file  Social History Narrative  . Not on file      Allergies:  No Known Allergies  Metabolic Disorder Labs: Lab Results   Component Value Date   HGBA1C 6.5 (A) 12/15/2018   No results found for: PROLACTIN Lab Results  Component Value Date   CHOL 163 05/16/2018   TRIG 242 (H) 05/16/2018   HDL 39 (L) 05/16/2018   CHOLHDL 4.2 05/16/2018   VLDL 22 01/17/2016   LDLCALC 90 05/16/2018   LDLCALC 79 01/17/2016     Current Medications: Current Outpatient Medications  Medication Sig Dispense Refill  . ALPRAZolam (XANAX) 0.5 MG tablet Take 1 tablet (0.5 mg total) by mouth daily as needed for anxiety. 30 tablet 0  . aspirin 81 MG tablet Take 81 mg by mouth daily.      Marland Kitchen. atorvastatin (LIPITOR) 40 MG tablet TAKE ONE TABLET BY MOUTH EVERY NIGHT AT BEDTIME 90 tablet 2  . B-D UF III MINI PEN NEEDLES 31G X 5 MM MISC USE ONCE DAILY 100 each 10  . empagliflozin (JARDIANCE) 10 MG TABS tablet Take 10 mg by mouth daily. 30 tablet 4  . glucose blood (ONE TOUCH TEST STRIPS) test strip Pt testing one time a day. 100 each 3  . Insulin Glargine (BASAGLAR KWIKPEN) 100 UNIT/ML SOPN INJECT 0.38 MLS (38 UNITS TOTAL) INTO THE SKIN EVERY NIGHT AT BEDTIME 12 pen 2  . lisinopril (ZESTRIL) 10 MG tablet TAKE ONE TABLET BY MOUTH DAILY 90 tablet 0  . metFORMIN (GLUCOPHAGE) 1000 MG tablet TAKE ONE TABLET BY MOUTH TWICE A DAY WITH FOOD 180 tablet 1  . Misc. Devices MISC New CPAP machine with mask and supplies through [facility].  CPAP therapy at 14 cm. water pressure.    . ONE TOUCH ULTRA TEST test strip TEST as directed 100 each 3  . PARoxetine (PAXIL) 40 MG tablet Take 1 tablet (40 mg total) by mouth daily. 30 tablet 2  . tamsulosin (FLOMAX) 0.4 MG CAPS capsule Take 1 capsule (0.4 mg total) by mouth daily after supper. 30 capsule 5   No current facility-administered medications for this visit.       Psychiatric Specialty Exam: Review of Systems  Cardiovascular: Negative for chest pain.  Skin: Negative for rash.  Psychiatric/Behavioral: Negative for suicidal ideas.    There were no vitals taken for this visit.There is no  height or weight on file to calculate BMI.  General Appearance:   Eye Contact:    Speech:  Slow  Volume:  Decreased  Mood: somewhat subdued  Affect:  congruent  Thought Process:  Goal Directed  Orientation:  Full (Time, Place, and Person)  Thought Content:  Rumination  Suicidal Thoughts:  No  Homicidal Thoughts:  No  Memory:  Immediate;   Fair Recent;   Fair  Judgement:  Poor  Insight:  Shallow  Psychomotor Activity:  Decreased  Concentration:  Concentration: Fair and Attention Span: Fair  Recall:  [employer]Fair  Fund of Knowledge:Fair  Language: Fair  Akathisia:  Negative  Handed:  Right  AIMS (if indicated):  0  Assets:  Desire for Improvement  ADL's:  Intact  Cognition: WNL  Sleep:  fair    Treatment Plan Summary: Medication management and Plan as follows   1. Major depression moderate recurrent: baseline, continue paxil, take half for first 2 days since off for few days. Then increase to one a day 3. Panic attacks:sporadic, on xanax, avoid nicotine 4. Nicotine dependence: now taking e cig. Working on cutting down  I discussed the assessment and treatment plan with the patient. The patient was provided an opportunity to ask questions and all were answered. The patient agreed with the plan and demonstrated an understanding of the instructions.   The patient was advised to call back or seek an in-person evaluation if the symptoms worsen or if the condition fails to improve as anticipated.  I provided 15 minutes of non-face-to-face time during this encounter. Fu [id number].   Thresa RossNadeem Sameena Artus, MD 7/21/20203:49 PM

## 2019-06-10 ENCOUNTER — Other Ambulatory Visit: Payer: Self-pay | Admitting: Family Medicine

## 2019-06-18 ENCOUNTER — Telehealth (HOSPITAL_COMMUNITY): Payer: Self-pay

## 2019-06-18 ENCOUNTER — Other Ambulatory Visit (HOSPITAL_COMMUNITY): Payer: Self-pay | Admitting: Psychiatry

## 2019-06-18 MED ORDER — ALPRAZOLAM 0.5 MG PO TABS: 0.5000 mg | ORAL_TABLET | Freq: Every day | ORAL | 0 refills | Status: DC | PRN

## 2019-06-18 NOTE — Telephone Encounter (Signed)
This is a Dr. De Nurse patient who is requesting a refill on Xanax. [facility] uses [facility] [place]

## 2019-06-18 NOTE — Telephone Encounter (Signed)
Rx sent 

## 2019-06-27 DIAGNOSIS — G4733 Obstructive sleep apnea (adult) (pediatric): Secondary | ICD-10-CM | POA: Diagnosis not present

## 2019-07-10 ENCOUNTER — Other Ambulatory Visit: Payer: Self-pay | Admitting: Family Medicine

## 2019-07-10 ENCOUNTER — Other Ambulatory Visit: Payer: Self-pay

## 2019-07-10 ENCOUNTER — Encounter: Payer: Self-pay | Admitting: Family Medicine

## 2019-07-10 ENCOUNTER — Ambulatory Visit (INDEPENDENT_AMBULATORY_CARE_PROVIDER_SITE_OTHER): Payer: Medicare HMO | Admitting: Family Medicine

## 2019-07-10 VITALS — BP 128/79 | HR 84 | Temp 98.2°F | Ht 66.0 in | Wt 186.0 lb

## 2019-07-10 DIAGNOSIS — R350 Frequency of micturition: Secondary | ICD-10-CM | POA: Diagnosis not present

## 2019-07-10 DIAGNOSIS — N401 Enlarged prostate with lower urinary tract symptoms: Secondary | ICD-10-CM

## 2019-07-10 DIAGNOSIS — R319 Hematuria, unspecified: Secondary | ICD-10-CM

## 2019-07-10 LAB — POCT URINALYSIS DIPSTICK
Bilirubin, UA: NEGATIVE
Glucose, UA: POSITIVE — AB
Ketones, UA: NEGATIVE
Nitrite, UA: POSITIVE
Protein, UA: POSITIVE — AB
Spec Grav, UA: 1.025 (ref 1.010–1.025)
Urobilinogen, UA: 0.2 E.U./dL
pH, UA: 6 (ref 5.0–8.0)

## 2019-07-10 MED ORDER — CIPROFLOXACIN HCL 500 MG PO TABS: 500.0000 mg | ORAL_TABLET | Freq: Two times a day (BID) | ORAL | 0 refills | Status: DC

## 2019-07-10 NOTE — Patient Instructions (Signed)
Thank you for coming in today. Start cipro antibiotic twice daily today for 2 weeks.  Restart flomax medicine.  We will do blood work and urine test today.  Get back with Urology. Let me know if you cant any help.   Recheck with Dr Madilyn Fireman or me as needed.    Benign Prostatic Hyperplasia  Benign prostatic hyperplasia (BPH) is an enlarged prostate gland that is caused by the normal aging process and not by cancer. The prostate is a walnut-sized gland that is involved in the production of semen. It is located in front of the rectum and below the bladder. The bladder stores urine and the urethra is the tube that carries the urine out of the body. The prostate may get bigger as a man gets older. An enlarged prostate can press on the urethra. This can make it harder to pass urine. The build-up of urine in the bladder can cause infection. Back pressure and infection may progress to bladder damage and kidney (renal) failure. What are the causes? This condition is part of a normal aging process. However, not all men develop problems from this condition. If the prostate enlarges away from the urethra, urine flow will not be blocked. If it enlarges toward the urethra and compresses it, there will be problems passing urine. What increases the risk? This condition is more likely to develop in men over the age of 84 years. What are the signs or symptoms? Symptoms of this condition include:  Getting up often during the night to urinate.  Needing to urinate frequently during the day.  Difficulty starting urine flow.  Decrease in size and strength of your urine stream.  Leaking (dribbling) after urinating.  Inability to pass urine. This needs immediate treatment.  Inability to completely empty your bladder.  Pain when you pass urine. This is more common if there is also an infection.  Urinary tract infection (UTI). How is this diagnosed? This condition is diagnosed based on your medical  history, a physical exam, and your symptoms. Tests will also be done, such as:  A post-void bladder scan. This measures any amount of urine that may remain in your bladder after you finish urinating.  A digital rectal exam. In a rectal exam, your health care provider checks your prostate by putting a lubricated, gloved finger into your rectum to feel the back of your prostate gland. This exam detects the size of your gland and any abnormal lumps or growths.  An exam of your urine (urinalysis).  A prostate specific antigen (PSA) screening. This is a blood test used to screen for prostate cancer.  An ultrasound. This test uses sound waves to electronically produce a picture of your prostate gland. Your health care provider may refer you to a specialist in kidney and prostate diseases (urologist). How is this treated? Once symptoms begin, your health care provider will monitor your condition (active surveillance or watchful waiting). Treatment for this condition will depend on the severity of your condition. Treatment may include:  Observation and yearly exams. This may be the only treatment needed if your condition and symptoms are mild.  Medicines to relieve your symptoms, including: ? Medicines to shrink the prostate. ? Medicines to relax the muscle of the prostate.  Surgery in severe cases. Surgery may include: ? Prostatectomy. In this procedure, the prostate tissue is removed completely through an open incision or with a laparoscope or robotics. ? Transurethral resection of the prostate (TURP). In this procedure, a tool is inserted  through the opening at the tip of the penis (urethra). It is used to cut away tissue of the inner core of the prostate. The pieces are removed through the same opening of the penis. This removes the blockage. ? Transurethral incision (TUIP). In this procedure, small cuts are made in the prostate. This lessens the prostate's pressure on the urethra. ?  Transurethral microwave thermotherapy (TUMT). This procedure uses microwaves to create heat. The heat destroys and removes a small amount of prostate tissue. ? Transurethral needle ablation (TUNA). This procedure uses radio frequencies to destroy and remove a small amount of prostate tissue. ? Interstitial laser coagulation (ILC). This procedure uses a laser to destroy and remove a small amount of prostate tissue. ? Transurethral electrovaporization (TUVP). This procedure uses electrodes to destroy and remove a small amount of prostate tissue. ? Prostatic urethral lift. This procedure inserts an implant to push the lobes of the prostate away from the urethra. Follow these instructions at home:  Take over-the-counter and prescription medicines only as told by your health care provider.  Monitor your symptoms for any changes. Contact your health care provider with any changes.  Avoid drinking large amounts of liquid before going to bed or out in public.  Avoid or reduce how much caffeine or alcohol you drink.  Give yourself time when you urinate.  Keep all follow-up visits as told by your health care provider. This is important. Contact a health care provider if:  You have unexplained back pain.  Your symptoms do not get better with treatment.  You develop side effects from the medicine you are taking.  Your urine becomes very dark or has a bad smell.  Your lower abdomen becomes distended and you have trouble passing your urine. Get help right away if:  You have a fever or chills.  You suddenly cannot urinate.  You feel lightheaded, or very dizzy, or you faint.  There are large amounts of blood or clots in the urine.  Your urinary problems become hard to manage.  You develop moderate to severe low back or flank pain. The flank is the side of your body between the ribs and the hip. These symptoms may represent a serious problem that is an emergency. Do not wait to see if the  symptoms will go away. Get medical help right away. Call your local emergency services (911 in the [place] Do not drive yourself to the hospital. Summary  Benign prostatic hyperplasia (BPH) is an enlarged prostate that is caused by the normal aging process and not by cancer.  An enlarged prostate can press on the urethra. This can make it hard to pass urine.  This condition is part of a normal aging process and is more likely to develop in men over the age of 50 years.  Get help right away if you suddenly cannot urinate. This information is not intended to replace advice given to you by your health care provider. Make sure you discuss any questions you have with your health care provider. Document Released: 10/29/2005 Document Revised: 09/23/2018 Document Reviewed: 12/03/2016 Elsevier Patient Education  2020 [employer]

## 2019-07-10 NOTE — Progress Notes (Signed)
Gabriel Gilbert is a 68 y.o. male who [facility] today for blood in urine  Patient has a history of BPH and a several month history of urinary frequency feeling of incomplete bladder voiding and post void dribbling.  He notes this morning he woke and had blood in the urine with visible clots.  He notes worsening symptoms.  He denies any fevers or chills.  He does have a history of kidney stones seen by urology in [place] but notes that his symptoms are not consistent with kidney stones.  He had a trial of Flomax a few months ago and only took it for few days and was not sure if it helped any.  He is not currently taking Flomax.  He denies any drug allergies.  ROS as above:  Exam:  BP 128/79   Pulse 84   Temp 98.2 F (36.8 C) (Oral)   Ht 5\' 6"  (1.676 m)   Wt 186 lb (84.4 kg)   BMI 30.02 kg/m  Wt Readings from Last 5 Encounters:  07/10/19 186 lb (84.4 kg)  04/24/19 189 lb (85.7 kg)  12/15/18 197 lb (89.4 kg)  09/23/18 190 lb (86.2 kg)  08/12/18 194 lb (88 kg)    Gen: Well NAD HEENT: EOMI,  MMM Lungs: Normal work of breathing. CTABL Heart: RRR no MRG Abd: NABS, Soft. Nondistended, Nontender no masses palpated.  No CV angle tenderness to percussion. Exts: Brisk capillary refill, warm and well perfused.  Rectal exam: Small hemorrhoid present.  Prostate is enlarged and firm.  Not particular tender.  No nodules palpated.  Lab and Radiology Results Results for orders placed or performed in visit on 07/10/19 (from the past 72 hour(s))  POCT urinalysis dipstick     Status: Abnormal   Collection Time: 07/10/19 11:19 AM  Result Value Ref Range   Color, UA light red    Clarity, UA Cloudy    Glucose, UA Positive (A) Negative   Bilirubin, UA Negative    Ketones, UA Negative    Spec Grav, UA 1.025 1.010 - 1.025   Blood, UA Large    pH, UA 6.0 5.0 -  8.0   Protein, UA Positive (A) Negative    Comment: 300   Urobilinogen, UA 0.2 0.2 or 1.0 E.U./dL   Nitrite, UA Positive    Leukocytes, UA Trace (A) Negative   Appearance light red    Odor malodorousf    No results found.  Lab Results  Component Value Date   PSA 1.5 05/16/2018   PSA 1.2 06/06/2017   PSA 0.90 04/05/2010     Assessment and Plan: 68 y.o. male with hematuria with lower urinary tract symptoms and enlarged prostate.  PSA checked somewhat recently and was in normal range.  Plan to treat empirically with Cipro for likely prostatitis versus UTI.  However urinalysis and culture is currently pending. We will also check CBC with differential metabolic panel and PSA today.  Recommend follow back up with urology.  Recommend also restarting Flomax.  If not sufficiently controlled may consider finasteride with PCP.  Check back with PCP in near future.   PDMP reviewed during this encounter. Orders Placed This Encounter  Procedures  . Urine Culture  . CBC with Differential/Platelet  . COMPLETE METABOLIC PANEL WITH GFR  . PSA  . Urinalysis, microscopic only  . POCT urinalysis dipstick   Meds ordered this encounter  Medications  . ciprofloxacin (  CIPRO) 500 MG tablet    Sig: Take 1 tablet (500 mg total) by mouth 2 (two) times daily.    Dispense:  28 tablet    Refill:  0     Historical information moved to improve visibility of documentation.  Past Medical History:  Diagnosis Date  . Bell's palsy 12-10  . Depression   . Diabetes mellitus    type 2  . Hyperlipidemia   . Hypertension    Past Surgical History:  Procedure Laterality Date  . CATARACT EXTRACTION Left   . kidney stone removed    . pfts     normal   Social History   Tobacco Use  . Smoking status: Current Every Day Smoker    Packs/day: 1.00    Years: 25.00    Pack years: 25.00    Types: Cigarettes    Last attempt to quit: 02/10/2013    Years since quitting: 6.4  . Smokeless tobacco: Never Used   . Tobacco comment: using electronic cigarettes  Substance Use Topics  . Alcohol use: No    Alcohol/week: 0.0 standard drinks   family history includes Alzheimer's disease in his mother; COPD in his father, mother, and sister; Cancer in his sister; Diabetes in his father; Drug abuse in his son; Hypertension in his mother.  Medications: Current Outpatient Medications  Medication Sig Dispense Refill  . ALPRAZolam (XANAX) 0.5 MG tablet Take 1 tablet (0.5 mg total) by mouth daily as needed for anxiety. 30 tablet 0  . aspirin 81 MG tablet Take 81 mg by mouth daily.      Marland Kitchen atorvastatin (LIPITOR) 40 MG tablet TAKE ONE TABLET BY MOUTH EVERY NIGHT AT BEDTIME 90 tablet 2  . B-D UF III MINI PEN NEEDLES 31G X 5 MM MISC USE ONCE DAILY 100 each 10  . empagliflozin (JARDIANCE) 10 MG TABS tablet Take 10 mg by mouth daily. 30 tablet 4  . glucose blood (ONE TOUCH TEST STRIPS) test strip Pt testing one time a day. 100 each 3  . Insulin Glargine (BASAGLAR KWIKPEN) 100 UNIT/ML SOPN INJECT 0.38 MLS (38 UNITS TOTAL) INTO THE SKIN EVERY NIGHT AT BEDTIME 12 pen 2  . lisinopril (ZESTRIL) 10 MG tablet TAKE ONE TABLET BY MOUTH DAILY 90 tablet 1  . metFORMIN (GLUCOPHAGE) 1000 MG tablet TAKE ONE TABLET BY MOUTH TWICE A DAY WITH FOOD 180 tablet 1  . Misc. Devices MISC New CPAP machine with mask and supplies through [place].  CPAP therapy at 14 cm. water pressure.    . ONE TOUCH ULTRA TEST test strip TEST as directed 100 each 3  . PARoxetine (PAXIL) 40 MG tablet Take 1 tablet (40 mg total) by mouth daily. 30 tablet 2  . tamsulosin (FLOMAX) 0.4 MG CAPS capsule Take 1 capsule (0.4 mg total) by mouth daily after supper. 30 capsule 5  . ciprofloxacin (CIPRO) 500 MG tablet Take 1 tablet (500 mg total) by mouth 2 (two) times daily. 28 tablet 0   No current facility-administered medications for this visit.    No Known Allergies   Discussed warning signs or symptoms. Please see discharge instructions. Patient  expresses understanding.

## 2019-07-11 LAB — CBC WITH DIFFERENTIAL/PLATELET
Absolute Monocytes: 882 cells/uL (ref 200–950)
Basophils Absolute: 61 cells/uL (ref 0–200)
Basophils Relative: 0.4 %
Eosinophils Absolute: 46 cells/uL (ref 15–500)
Eosinophils Relative: 0.3 %
HCT: 49.5 % (ref 38.5–50.0)
Hemoglobin: 16.5 g/dL (ref 13.2–17.1)
Lymphs Abs: 2158 cells/uL (ref 850–3900)
MCH: 31.3 pg (ref 27.0–33.0)
MCHC: 33.3 g/dL (ref 32.0–36.0)
MCV: 93.9 fL (ref 80.0–100.0)
MPV: 10.7 fL (ref 7.5–12.5)
Monocytes Relative: 5.8 %
Neutro Abs: 12054 cells/uL — ABNORMAL HIGH (ref 1500–7800)
Neutrophils Relative %: 79.3 %
Platelets: 333 10*3/uL (ref 140–400)
RBC: 5.27 10*6/uL (ref 4.20–5.80)
RDW: 13.8 % (ref 11.0–15.0)
Total Lymphocyte: 14.2 %
WBC: 15.2 10*3/uL — ABNORMAL HIGH (ref 3.8–10.8)

## 2019-07-11 LAB — COMPLETE METABOLIC PANEL WITH GFR
AG Ratio: 2.2 (calc) (ref 1.0–2.5)
ALT: 8 U/L — ABNORMAL LOW (ref 9–46)
AST: 11 U/L (ref 10–35)
Albumin: 4.2 g/dL (ref 3.6–5.1)
Alkaline phosphatase (APISO): 59 U/L (ref 35–144)
BUN: 15 mg/dL (ref 7–25)
CO2: 22 mmol/L (ref 20–32)
Calcium: 9.6 mg/dL (ref 8.6–10.3)
Chloride: 109 mmol/L (ref 98–110)
Creat: 0.84 mg/dL (ref 0.70–1.25)
GFR, Est African American: 106 mL/min/{1.73_m2} (ref >=60)
GFR, Est Non African American: 91 mL/min/{1.73_m2} (ref >=60)
Globulin: 1.9 g/dL (calc) (ref 1.9–3.7)
Glucose, Bld: 109 mg/dL — ABNORMAL HIGH (ref 65–99)
Potassium: 4.4 mmol/L (ref 3.5–5.3)
Sodium: 141 mmol/L (ref 135–146)
Total Bilirubin: 0.4 mg/dL (ref 0.2–1.2)
Total Protein: 6.1 g/dL (ref 6.1–8.1)

## 2019-07-11 LAB — PSA: PSA: 2.2 ng/mL (ref <=4.0)

## 2019-07-16 ENCOUNTER — Telehealth (HOSPITAL_COMMUNITY): Payer: Self-pay

## 2019-07-16 MED ORDER — ALPRAZOLAM 0.5 MG PO TABS: 0.5000 mg | ORAL_TABLET | Freq: Every day | ORAL | 0 refills | Status: DC | PRN

## 2019-07-16 NOTE — Telephone Encounter (Signed)
Patient called to request a refill on Alprazolam. Gabriel Gilbert in [place]

## 2019-07-16 NOTE — Telephone Encounter (Signed)
sent 

## 2019-07-28 DIAGNOSIS — R350 Frequency of micturition: Secondary | ICD-10-CM | POA: Diagnosis not present

## 2019-07-28 DIAGNOSIS — R310 Gross hematuria: Secondary | ICD-10-CM | POA: Diagnosis not present

## 2019-07-28 DIAGNOSIS — G4733 Obstructive sleep apnea (adult) (pediatric): Secondary | ICD-10-CM | POA: Diagnosis not present

## 2019-07-29 ENCOUNTER — Other Ambulatory Visit: Payer: Self-pay

## 2019-07-29 ENCOUNTER — Encounter: Payer: Self-pay | Admitting: Family Medicine

## 2019-07-29 ENCOUNTER — Ambulatory Visit (INDEPENDENT_AMBULATORY_CARE_PROVIDER_SITE_OTHER): Payer: Medicare HMO | Admitting: Family Medicine

## 2019-07-29 VITALS — BP 134/76 | HR 92 | Temp 98.2°F | Wt 184.0 lb

## 2019-07-29 DIAGNOSIS — L089 Local infection of the skin and subcutaneous tissue, unspecified: Secondary | ICD-10-CM

## 2019-07-29 MED ORDER — CLINDAMYCIN HCL 300 MG PO CAPS: 300.0000 mg | ORAL_CAPSULE | Freq: Three times a day (TID) | ORAL | 0 refills | Status: DC

## 2019-07-29 NOTE — Patient Instructions (Signed)
Thank you for coming in today. I think you have a dental or facial infection.  Start clindamycin today.  If not improving quickly let us know.  Recommend follow up with dentist.  Returns as needed.    Dental Abscess  A dental abscess is a collection of pus in or around a tooth that results from an infection. An abscess can cause pain in the affected area as well as other symptoms. Treatment is important to help with symptoms and to prevent the infection from spreading. What are the causes? This condition is caused by a bacterial infection around the root of the tooth that involves the inner part of the tooth (pulp). It may result from:  Severe tooth decay.  Trauma to the tooth, such as a broken or chipped tooth, that allows bacteria to enter into the pulp.  Severe gum disease around a tooth. What increases the risk? This condition is more likely to develop in males. It is also more likely to develop in people who:  Have dental decay (cavities).  Eat sugary snacks between meals.  Use tobacco products.  Have diabetes.  Have a weakened disease-fighting system (immune system).  Do not brush and care for their teeth regularly. What are the signs or symptoms? Symptoms of this condition include:  Severe pain in and around the infected tooth.  Swelling and redness around the infected tooth, in the mouth, or in the face.  Tenderness.  Pus drainage.  Bad breath.  Bitter taste in the mouth.  Difficulty swallowing.  Difficulty opening the mouth.  Nausea.  Vomiting.  Chills.  Swollen neck glands.  Fever. How is this diagnosed? This condition is diagnosed based on:  Your symptoms and your medical and dental history.  An examination of the infected tooth. During the exam, your dentist may tap on the infected tooth. You may also have X-rays of the affected area. How is this treated? This condition is treated by getting rid of the infection. This may be done with:   Incision and drainage. This procedure is done by making an incision in the abscess to drain out the pus. Removing pus is the first priority in treating an abscess.  Antibiotic medicines. These may be used in certain situations.  Antibacterial mouth rinse.  A root canal. This may be performed to save the tooth. Your dentist accesses the visible part of your tooth (crown) with a drill and removes any damaged pulp. Then the space is filled and sealed off.  Tooth extraction. The tooth is pulled out if it cannot be saved by other treatment. You may also receive treatment for pain, such as:  Acetaminophen or NSAIDs.  Gels that contain a numbing medicine.  An injection to block the pain near your nerve. Follow these instructions at home: Medicines  Take over-the-counter and prescription medicines only as told by your dentist.  If you were prescribed an antibiotic, take it as told by your dentist. Do not stop taking the antibiotic even if you start to feel better.  If you were prescribed a gel that contains a numbing medicine, use it exactly as told in the directions. Do not use these gels for children who are younger than 262 years of age.  Do not drive or use heavy machinery while taking prescription pain medicine. General instructions  Rinse out your mouth often with salt water to relieve pain or swelling. To make a salt-water mixture, completely dissolve -1 tsp of salt in 1 cup of warm water.  Eat a soft diet while your abscess is healing.  Drink enough fluid to keep your urine pale yellow.  Do not apply heat to the outside of your mouth.  Do not use any products that contain nicotine or tobacco, such as cigarettes and e-cigarettes. If you need help quitting, ask your health care provider.  Keep all follow-up visits as told by your dentist. This is important. How is this prevented?  Brush your teeth every morning and night with fluoride toothpaste. Floss one time each day.   Get regularly scheduled dental cleanings.  Consider having a dental sealant applied on teeth that have deep holes (caries).  Drink fluoridated water regularly. This includes most tap water. Check the label on bottled water to see if it contains fluoride.  Drink water instead of sugary drinks.  Eat healthy meals and snacks.  Wear a mouth guard or face shield to protect your teeth while playing sports. Contact a health care provider if:  Your pain is worse and is not helped by medicine. Get help right away if:  You have a fever or chills.  Your symptoms suddenly get worse.  You have a very bad headache.  You have problems breathing or swallowing.  You have trouble opening your mouth.  You have swelling in your neck or around your eye. Summary  A dental abscess is a collection of pus in or around a tooth that results from an infection.  A dental abscess may result from severe tooth decay, trauma to the tooth, or severe gum disease around a tooth.  Symptoms include severe pain, swelling, redness, and drainage of pus in and around the infected tooth.  The first priority in treating a dental abscess is to drain out the pus. Treatment may also involve removing damage inside the tooth (root canal) or pulling out (extracting) the tooth. This information is not intended to replace advice given to you by your health care provider. Make sure you discuss any questions you have with your health care provider. Document Released: 10/29/2005 Document Revised: 10/11/2017 Document Reviewed: 07/01/2017 Elsevier Patient Education  2020 [employer].

## 2019-07-29 NOTE — Progress Notes (Signed)
Gabriel Gilbert is a 68 y.o. male who [facility] today for right facial swelling present for a few days. Worsening recently.  He notes that he has multiple open teeth with history of frequent dental infections.  He notes the right upper tooth has been a little sore recently.  He denies any drainage.  He notes his facial swelling has been associated pain which is manageable with Advil.    He was recently seen on August 28 for hematuria thought to be related to prostatitis.  He was treated with Cipro and referred to urology.  He notes he got better pretty quickly with ciprofloxacin and stopped taking it after about 5 days.  He restarted Cipro a few days ago with facial swelling and notes that it has not helped much.    ROS as above:  Exam:  BP 134/76   Pulse 92   Temp 98.2 F (36.8 C) (Oral)   Wt 184 lb (83.5 kg)   BMI 29.70 kg/m  Wt Readings from Last 5 Encounters:  07/29/19 184 lb (83.5 kg)  07/10/19 186 lb (84.4 kg)  04/24/19 189 lb (85.7 kg)  12/15/18 197 lb (89.4 kg)  09/23/18 190 lb (86.2 kg)    Gen: Well NAD HEENT: EOMI,  MMM multiple dental caries with broken teeth.  Right upper incisor tender to palpation mildly.  No surrounding erythema or expressible pus.  Right facial swelling present.  Mildly tender palpation right maxillary area.  No discrete abscesses or fluctuant areas identified.  Normal eye motion bilaterally. Lungs: Normal work of breathing. CTABL Heart: RRR no MRG Abd: NABS, Soft. Nondistended, Nontender Exts: Brisk capillary refill, warm and well perfused.   Lab and Radiology Results No results found for this or any previous visit (from the past 72 hour(s)). No results found.    Assessment and Plan: 68 y.o. male with dental infection likely resulting in cellulitis of face.  Doubtful for severe infection.  Not responding to  ciprofloxacin.  Plan to switch to clindamycin and recheck if not improving in near future.  If not improving may proceed with CT scan maxillofacial area  PDMP not reviewed this encounter. No orders of the defined types were placed in this encounter.  Meds ordered this encounter  Medications  . clindamycin (CLEOCIN) 300 MG capsule    Sig: Take 1 capsule (300 mg total) by mouth 3 (three) times daily.    Dispense:  30 capsule    Refill:  0     Historical information moved to improve visibility of documentation.  Past Medical History:  Diagnosis Date  . Bell's palsy 12-10  . Depression   . Diabetes mellitus    type 2  . Hyperlipidemia   . Hypertension    Past Surgical History:  Procedure Laterality Date  . CATARACT EXTRACTION Left   . kidney stone removed    . pfts     normal   Social History   Tobacco Use  . Smoking status: Current Every Day Smoker    Packs/day: 1.00    Years: 25.00    Pack years: 25.00    Types: Cigarettes    Last attempt to quit: 02/10/2013    Years since quitting: 6.4  . Smokeless tobacco: Never Used  . Tobacco comment: using electronic cigarettes  Substance Use Topics  . Alcohol use: No    Alcohol/week: 0.0 standard drinks   family history includes Alzheimer's disease  in his mother; COPD in his father, mother, and sister; Cancer in his sister; Diabetes in his father; Drug abuse in his son; Hypertension in his mother.  Medications: Current Outpatient Medications  Medication Sig Dispense Refill  . ALPRAZolam (XANAX) 0.5 MG tablet Take 1 tablet (0.5 mg total) by mouth daily as needed for anxiety. 30 tablet 0  . aspirin 81 MG tablet Take 81 mg by mouth daily.      Marland Kitchen atorvastatin (LIPITOR) 40 MG tablet TAKE ONE TABLET BY MOUTH EVERY NIGHT AT BEDTIME 90 tablet 2  . B-D UF III MINI PEN NEEDLES 31G X 5 MM MISC USE ONCE DAILY 100 each 10  . clindamycin (CLEOCIN) 300 MG capsule Take 1 capsule (300 mg total) by mouth 3 (three) times daily. 30 capsule 0  .  empagliflozin (JARDIANCE) 10 MG TABS tablet Take 10 mg by mouth daily. 30 tablet 4  . glucose blood (ONE TOUCH TEST STRIPS) test strip Pt testing one time a day. 100 each 3  . Insulin Glargine (BASAGLAR KWIKPEN) 100 UNIT/ML SOPN INJECT 0.38 MLS (38 UNITS TOTAL) INTO THE SKIN EVERY NIGHT AT BEDTIME 12 pen 2  . lisinopril (ZESTRIL) 10 MG tablet TAKE ONE TABLET BY MOUTH DAILY 90 tablet 1  . metFORMIN (GLUCOPHAGE) 1000 MG tablet TAKE ONE TABLET BY MOUTH TWICE A DAY WITH FOOD 180 tablet 1  . Misc. Devices MISC New CPAP machine with mask and supplies through [place].  CPAP therapy at 14 cm. water pressure.    . ONE TOUCH ULTRA TEST test strip TEST as directed 100 each 3  . PARoxetine (PAXIL) 40 MG tablet Take 1 tablet (40 mg total) by mouth daily. 30 tablet 2  . tamsulosin (FLOMAX) 0.4 MG CAPS capsule Take 1 capsule (0.4 mg total) by mouth daily after supper. 30 capsule 5   No current facility-administered medications for this visit.    No Known Allergies   Discussed warning signs or symptoms. Please see discharge instructions. Patient expresses understanding.

## 2019-08-12 LAB — URINALYSIS, MICROSCOPIC ONLY
Hyaline Cast: NONE SEEN /LPF
Squamous Epithelial / HPF: NONE SEEN /HPF (ref <=5)
WBC, UA: >=60 /HPF — AB (ref 0–5)

## 2019-08-12 LAB — URINE CULTURE
MICRO NUMBER:: 826098
SPECIMEN QUALITY:: ADEQUATE

## 2019-08-12 LAB — HOUSE ACCOUNT TRACKING

## 2019-08-17 ENCOUNTER — Telehealth (HOSPITAL_COMMUNITY): Payer: Self-pay

## 2019-08-17 NOTE — Telephone Encounter (Signed)
Error

## 2019-08-18 ENCOUNTER — Telehealth (HOSPITAL_COMMUNITY): Payer: Self-pay | Admitting: Psychiatry

## 2019-08-18 NOTE — Telephone Encounter (Signed)
Pt calling, needs refill on xanax sent to [facility]

## 2019-08-18 NOTE — Telephone Encounter (Signed)
sent 

## 2019-08-24 ENCOUNTER — Encounter: Payer: Self-pay | Admitting: Family Medicine

## 2019-08-24 ENCOUNTER — Ambulatory Visit (INDEPENDENT_AMBULATORY_CARE_PROVIDER_SITE_OTHER): Payer: Medicare HMO | Admitting: Family Medicine

## 2019-08-24 VITALS — BP 128/56 | HR 82 | Temp 97.9°F | Ht 66.0 in | Wt 180.0 lb

## 2019-08-24 DIAGNOSIS — Z794 Long term (current) use of insulin: Secondary | ICD-10-CM

## 2019-08-24 DIAGNOSIS — Z1211 Encounter for screening for malignant neoplasm of colon: Secondary | ICD-10-CM | POA: Diagnosis not present

## 2019-08-24 DIAGNOSIS — I10 Essential (primary) hypertension: Secondary | ICD-10-CM

## 2019-08-24 DIAGNOSIS — Z1159 Encounter for screening for other viral diseases: Secondary | ICD-10-CM

## 2019-08-24 DIAGNOSIS — E11630 Type 2 diabetes mellitus with periodontal disease: Secondary | ICD-10-CM

## 2019-08-24 DIAGNOSIS — Z23 Encounter for immunization: Secondary | ICD-10-CM

## 2019-08-24 LAB — POCT GLYCOSYLATED HEMOGLOBIN (HGB A1C): Hemoglobin A1C: 5.6 % (ref 4.0–5.6)

## 2019-08-24 NOTE — Patient Instructions (Signed)
Decrease insulin to 20 units daily.

## 2019-08-24 NOTE — Progress Notes (Signed)
Established Patient Office Visit  Subjective:  Patient ID: Gabriel Gilbert, male    DOB: 05-01-1951  Age: 68 y.o. MRN: [id number]  CC:  Chief Complaint  Patient presents with  . Diabetes    HPI [name] presents for   Hypertension- Pt denies chest pain, SOB, dizziness, or heart palpitations.  Taking meds as directed w/o problems.  Denies medication side effects.    Diabetes - no hypoglycemic events. No wounds or sores that are not healing well. No increased thirst or urination. Checking glucose at home. Taking medications as prescribed without any side effects. He is suing 38 units of basal glargine daily.  He says that about a month ago he actually switched to a vegetarian diet.  He really wants to be able to get off of insulin and so decided to try to make some changes.  He is also lost about 4 pounds in the last month as well.  He is also due for colon cancer screening.  He did stool cards about a year and a half ago and is due for repeat screening.  He has not had any problems recently   Past Medical History:  Diagnosis Date  . Bell's palsy 12-10  . Depression   . Diabetes mellitus    type 2  . Hyperlipidemia   . Hypertension     Past Surgical History:  Procedure Laterality Date  . CATARACT EXTRACTION Left   . kidney stone removed    . pfts     normal    Family History  Problem Relation Age of Onset  . COPD Mother   . Hypertension Mother   . Alzheimer's disease Mother        ?  . Diabetes Father   . COPD Father   . Drug abuse Son   . COPD Sister   . Cancer Sister        lung    Social History   Socioeconomic History  . Marital status: Single    Spouse name: Not on file  . Number of children: Not on file  . Years of education: Not on file  . Highest education level: Not on file  Occupational History  . Not on file  Social Needs  . Financial resource strain: Not on file  . Food insecurity    Worry: Not on file    Inability: Not on file  .  Transportation needs    Medical: Not on file    Non-medical: Not on file  Tobacco Use  . Smoking status: Current Every Day Smoker    Packs/day: 1.00    Years: 25.00    Pack years: 25.00    Types: Cigarettes    Last attempt to quit: 02/10/2013    Years since quitting: 6.5  . Smokeless tobacco: Never Used  . Tobacco comment: using electronic cigarettes  Substance and Sexual Activity  . Alcohol use: No    Alcohol/week: 0.0 standard drinks  . Drug use: No  . Sexual activity: Never    Partners: Female  Lifestyle  . Physical activity    Days per week: Not on file    Minutes per session: Not on file  . Stress: Not on file  Relationships  . Social Herbalist on phone: Not on file    Gets together: Not on file    Attends religious service: Not on file    Active member of club or organization: Not on file  Attends meetings of clubs or organizations: Not on file    Relationship status: Not on file  . Intimate partner violence    Fear of current or ex partner: Not on file    Emotionally abused: Not on file    Physically abused: Not on file    Forced sexual activity: Not on file  Other Topics Concern  . Not on file  Social History Narrative  . Not on file    Outpatient Medications Prior to Visit  Medication Sig Dispense Refill  . ALPRAZolam (XANAX) 0.5 MG tablet TAKE ONE TABLET BY MOUTH DAILY AS NEEDED FOR ANXIETY 30 tablet 0  . aspirin 81 MG tablet Take 81 mg by mouth daily.      Marland Kitchen atorvastatin (LIPITOR) 40 MG tablet TAKE ONE TABLET BY MOUTH EVERY NIGHT AT BEDTIME 90 tablet 2  . B-D UF III MINI PEN NEEDLES 31G X 5 MM MISC USE ONCE DAILY 100 each 10  . empagliflozin (JARDIANCE) 10 MG TABS tablet Take 10 mg by mouth daily. 30 tablet 4  . glucose blood (ONE TOUCH TEST STRIPS) test strip Pt testing one time a day. 100 each 3  . Insulin Glargine (BASAGLAR KWIKPEN) 100 UNIT/ML SOPN INJECT 0.38 MLS (38 UNITS TOTAL) INTO THE SKIN EVERY NIGHT AT BEDTIME 12 pen 2  . lisinopril  (ZESTRIL) 10 MG tablet TAKE ONE TABLET BY MOUTH DAILY 90 tablet 1  . metFORMIN (GLUCOPHAGE) 1000 MG tablet TAKE ONE TABLET BY MOUTH TWICE A DAY WITH FOOD 180 tablet 1  . Misc. Devices MISC New CPAP machine with mask and supplies through [facility].  CPAP therapy at 14 cm. water pressure.    . ONE TOUCH ULTRA TEST test strip TEST as directed 100 each 3  . PARoxetine (PAXIL) 40 MG tablet Take 1 tablet (40 mg total) by mouth daily. 30 tablet 2  . tamsulosin (FLOMAX) 0.4 MG CAPS capsule Take 1 capsule (0.4 mg total) by mouth daily after supper. 30 capsule 5  . clindamycin (CLEOCIN) 300 MG capsule Take 1 capsule (300 mg total) by mouth 3 (three) times daily. 30 capsule 0   No facility-administered medications prior to visit.     No Known Allergies  ROS Review of Systems    Objective:    Physical Exam  Constitutional: He is oriented to person, place, and time. He appears well-developed and well-nourished.  HENT:  Head: Normocephalic and atraumatic.  Cardiovascular: Normal rate, regular rhythm and normal heart sounds.  Pulmonary/Chest: Effort normal and breath sounds normal.  Neurological: He is alert and oriented to person, place, and time.  Skin: Skin is warm and dry.  Psychiatric: He has a normal mood and affect. His behavior is normal.    BP (!) 128/56   Pulse 82   Temp 97.9 F (36.6 C) (Oral)   Ht _0  (1.676 m)   Wt 180 lb (81.6 kg)   SpO2 97%   BMI 29.05 kg/m  Wt Readings from Last 3 Encounters:  08/24/19 180 lb (81.6 kg)  07/29/19 184 lb (83.5 kg)  07/10/19 186 lb (84.4 kg)     Health Maintenance Due  Topic Date Due  . Hepatitis C Screening  1951/09/03  . COLONOSCOPY  04/25/2016    There are no preventive care reminders to display for this patient.  Lab Results  Component Value Date   TSH 1.580 01/25/2015   Lab Results  Component Value Date   WBC 15.2 (H) 07/10/2019   HGB 16.5 07/10/2019  HCT 49.5 07/10/2019   MCV 93.9 07/10/2019   PLT 333  07/10/2019   Lab Results  Component Value Date   NA 141 07/10/2019   K 4.4 07/10/2019   CO2 22 07/10/2019   GLUCOSE 109 (H) 07/10/2019   BUN 15 07/10/2019   CREATININE 0.84 07/10/2019   BILITOT 0.4 07/10/2019   ALKPHOS 61 06/06/2017   AST 11 07/10/2019   ALT 8 (L) 07/10/2019   PROT 6.1 07/10/2019   ALBUMIN 4.1 06/06/2017   CALCIUM 9.6 07/10/2019   Lab Results  Component Value Date   CHOL 163 05/16/2018   Lab Results  Component Value Date   HDL 39 (L) 05/16/2018   Lab Results  Component Value Date   LDLCALC 90 05/16/2018   Lab Results  Component Value Date   TRIG 242 (H) 05/16/2018   Lab Results  Component Value Date   CHOLHDL 4.2 05/16/2018   Lab Results  Component Value Date   HGBA1C 5.6 08/24/2019      Assessment & Plan:   Problem List Items Addressed This Visit      Cardiovascular and Mediastinum   Essential hypertension, benign    Well controlled. Continue current regimen. Follow up in  3 mo      Relevant Orders   COMPLETE METABOLIC PANEL WITH GFR   Lipid panel     Endocrine   Type 2 diabetes mellitus with periodontal complication (HCC)    V7O looks phenomenal today at 5.6.  I decrease his Basaglar from 38 units down to 20 units.  Keep up the good regimen.  I do think the vegetarian diet has made a big difference for him.  And he is also down 4 pounds which is fantastic.  Otherwise continue with oral medications.  Follow-up in 3 months.      Relevant Orders   POCT HgB A1C (Completed)   COMPLETE METABOLIC PANEL WITH GFR   Lipid panel    Other Visit Diagnoses    Encounter for hepatitis C screening test for low risk patient    -  Primary   Relevant Orders   Hepatitis C Antibody   Needs flu shot       Relevant Orders   Flu Vaccine QUAD High Dose(Fluad) (Completed)   Need for 23-polyvalent pneumococcal polysaccharide vaccine       Relevant Orders   Pneumococcal polysaccharide vaccine 23-valent greater than or equal to 2yo subcutaneous/IM  (Completed)   Colon cancer screening         Colon cancer screening-discussed options with him.  He would like to opt for Cologuard so we will get that kit ordered and sent to his home.  For hepatitis C screening.  Flu vaccine and pneumonia vaccine administered today.   No orders of the defined types were placed in this encounter.   Follow-up: Return in about 3 months (around 11/24/2019) for Diabetes follow-up.    Beatrice Lecher, MD

## 2019-08-24 NOTE — Assessment & Plan Note (Signed)
Well controlled. Continue current regimen. Follow up in  3 mo .  

## 2019-08-24 NOTE — Assessment & Plan Note (Signed)
A1c looks phenomenal today at 5.6.  I decrease his Basaglar from 38 units down to 20 units.  Keep up the good regimen.  I do think the vegetarian diet has made a big difference for him.  And he is also down 4 pounds which is fantastic.  Otherwise continue with oral medications.  Follow-up in 3 months.

## 2019-08-25 DIAGNOSIS — Z794 Long term (current) use of insulin: Secondary | ICD-10-CM | POA: Diagnosis not present

## 2019-08-25 DIAGNOSIS — E11630 Type 2 diabetes mellitus with periodontal disease: Secondary | ICD-10-CM | POA: Diagnosis not present

## 2019-08-25 DIAGNOSIS — I10 Essential (primary) hypertension: Secondary | ICD-10-CM | POA: Diagnosis not present

## 2019-08-25 DIAGNOSIS — Z1159 Encounter for screening for other viral diseases: Secondary | ICD-10-CM | POA: Diagnosis not present

## 2019-08-26 LAB — LIPID PANEL
Cholesterol: 73 mg/dL (ref <200)
HDL: 33 mg/dL — ABNORMAL LOW (ref >=40)
LDL Cholesterol (Calc): 17 mg/dL (calc)
Non-HDL Cholesterol (Calc): 40 mg/dL (calc) (ref <130)
Total CHOL/HDL Ratio: 2.2 (calc) (ref <5.0)
Triglycerides: 145 mg/dL (ref <150)

## 2019-08-26 LAB — COMPLETE METABOLIC PANEL WITH GFR
AG Ratio: 2.2 (calc) (ref 1.0–2.5)
ALT: 6 U/L — ABNORMAL LOW (ref 9–46)
AST: 12 U/L (ref 10–35)
Albumin: 4.4 g/dL (ref 3.6–5.1)
Alkaline phosphatase (APISO): 56 U/L (ref 35–144)
BUN: 12 mg/dL (ref 7–25)
CO2: 22 mmol/L (ref 20–32)
Calcium: 9.3 mg/dL (ref 8.6–10.3)
Chloride: 106 mmol/L (ref 98–110)
Creat: 0.85 mg/dL (ref 0.70–1.25)
GFR, Est African American: 105 mL/min/{1.73_m2} (ref >=60)
GFR, Est Non African American: 91 mL/min/{1.73_m2} (ref >=60)
Globulin: 2 g/dL (calc) (ref 1.9–3.7)
Glucose, Bld: 172 mg/dL — ABNORMAL HIGH (ref 65–99)
Potassium: 5 mmol/L (ref 3.5–5.3)
Sodium: 138 mmol/L (ref 135–146)
Total Bilirubin: 0.5 mg/dL (ref 0.2–1.2)
Total Protein: 6.4 g/dL (ref 6.1–8.1)

## 2019-08-26 LAB — HEPATITIS C ANTIBODY
Hepatitis C Ab: NONREACTIVE
SIGNAL TO CUT-OFF: 0.01 (ref <1.00)

## 2019-08-27 DIAGNOSIS — G4733 Obstructive sleep apnea (adult) (pediatric): Secondary | ICD-10-CM | POA: Diagnosis not present

## 2019-09-01 ENCOUNTER — Ambulatory Visit (INDEPENDENT_AMBULATORY_CARE_PROVIDER_SITE_OTHER): Payer: Medicare HMO | Admitting: Psychiatry

## 2019-09-01 ENCOUNTER — Encounter (HOSPITAL_COMMUNITY): Payer: Self-pay | Admitting: Psychiatry

## 2019-09-01 DIAGNOSIS — F410 Panic disorder [episodic paroxysmal anxiety] without agoraphobia: Secondary | ICD-10-CM | POA: Diagnosis not present

## 2019-09-01 DIAGNOSIS — F411 Generalized anxiety disorder: Secondary | ICD-10-CM

## 2019-09-01 DIAGNOSIS — F331 Major depressive disorder, recurrent, moderate: Secondary | ICD-10-CM

## 2019-09-01 DIAGNOSIS — R69 Illness, unspecified: Secondary | ICD-10-CM | POA: Diagnosis not present

## 2019-09-01 MED ORDER — VENLAFAXINE HCL 37.5 MG PO TABS: 37.5000 mg | ORAL_TABLET | Freq: Every day | ORAL | 1 refills | Status: DC

## 2019-09-01 MED ORDER — PAROXETINE HCL 20 MG PO TABS: 20.0000 mg | ORAL_TABLET | Freq: Every day | ORAL | 0 refills | Status: DC

## 2019-09-01 NOTE — Progress Notes (Signed)
[facility] Outpatient Follow up visit  Telepsych Patient Identification: Gabriel Gilbert MRN:  [id number] Date of Evaluation:  09/01/2019 Referral Source: primary care.  Chief Complaint:   depression follow up  Visit Diagnosis:    ICD-10-CM   1. Major depressive disorder, recurrent episode, moderate (HCC)  F33.1   2. GAD (generalized anxiety disorder)  F41.1   3. Panic disorder  F41.0     History of Present Illness:  68  years old single white male. Referred initially for management of depression   I connected with [name] on 09/01/19 at  4:30 PM EDT by telephone and verified that I am speaking with the correct person using two identifiers.  I discussed the limitations, risks, security and privacy concerns of performing an evaluation and management service by telephone and the availability of in person appointments. I also discussed with the patient that there may be a patient responsible charge related to this service. The patient expressed understanding and agreed to proceed.   Has lost some weight, fu with primary care shows that veggie diet is helping Mood not subdued But feels irritable bowel getting worse. Says paxil used to help it but not feel it is doing now Xanax helps anxiety and panic  Denies using alcohol or drugs Panic attacks sporadic around people  Explained risk of not being on higher dose of Xanax and talked about forgetfulness and addiction    Aggravating factor:: lonliness ,modifying factor: dog. Some friends.  Timing in morning   Past Psychiatric History: depression ,anxiety  Previous Psychotropic Medications: Yes   Substance Abuse History in the last 12 months:  No.  Consequences of Substance Abuse: NA  Past Medical History:  Past Medical History:  Diagnosis Date  . Bell's palsy 12-10  . Depression   . Diabetes mellitus    type 2  . Hyperlipidemia   . Hypertension     Past Surgical History:  Procedure Laterality Date  . CATARACT  EXTRACTION Left   . kidney stone removed    . pfts     normal    Family Psychiatric History: anxiety. Mom used valium  Family History:  Family History  Problem Relation Age of Onset  . COPD Mother   . Hypertension Mother   . Alzheimer's disease Mother        ?  . Diabetes Father   . COPD Father   . Drug abuse Son   . COPD Sister   . Cancer Sister        lung    Social History:   Social History   Socioeconomic History  . Marital status: Single    Spouse name: Not on file  . Number of children: Not on file  . Years of education: Not on file  . Highest education level: Not on file  Occupational History  . Not on file  Social Needs  . Financial resource strain: Not on file  . Food insecurity    Worry: Not on file    Inability: Not on file  . Transportation needs    Medical: Not on file    Non-medical: Not on file  Tobacco Use  . Smoking status: Current Every Day Smoker    Packs/day: 1.00    Years: 25.00    Pack years: 25.00    Types: Cigarettes    Last attempt to quit: 02/10/2013    Years since quitting: 6.5  . Smokeless tobacco: Never Used  . Tobacco comment: using electronic cigarettes  Substance and Sexual Activity  . Alcohol use: No    Alcohol/week: 0.0 standard drinks  . Drug use: No  . Sexual activity: Never    Partners: Female  Lifestyle  . Physical activity    Days per week: Not on file    Minutes per session: Not on file  . Stress: Not on file  Relationships  . Social Musicianconnections    Talks on phone: Not on file    Gets together: Not on file    Attends religious service: Not on file    Active member of club or organization: Not on file    Attends meetings of clubs or organizations: Not on file    Relationship status: Not on file  Other Topics Concern  . Not on file  Social History Narrative  . Not on file      Allergies:  No Known Allergies  Metabolic Disorder Labs: Lab Results  Component Value Date   HGBA1C 5.6 08/24/2019   No  results found for: PROLACTIN Lab Results  Component Value Date   CHOL 73 08/25/2019   TRIG 145 08/25/2019   HDL 33 (L) 08/25/2019   CHOLHDL 2.2 08/25/2019   VLDL 22 01/17/2016   LDLCALC 17 08/25/2019   LDLCALC 90 05/16/2018     Current Medications: Current Outpatient Medications  Medication Sig Dispense Refill  . ALPRAZolam (XANAX) 0.5 MG tablet TAKE ONE TABLET BY MOUTH DAILY AS NEEDED FOR ANXIETY 30 tablet 0  . aspirin 81 MG tablet Take 81 mg by mouth daily.      Marland Kitchen. atorvastatin (LIPITOR) 40 MG tablet TAKE ONE TABLET BY MOUTH EVERY NIGHT AT BEDTIME 90 tablet 2  . B-D UF III MINI PEN NEEDLES 31G X 5 MM MISC USE ONCE DAILY 100 each 10  . empagliflozin (JARDIANCE) 10 MG TABS tablet Take 10 mg by mouth daily. 30 tablet 4  . glucose blood (ONE TOUCH TEST STRIPS) test strip Pt testing one time a day. 100 each 3  . Insulin Glargine (BASAGLAR KWIKPEN) 100 UNIT/ML SOPN INJECT 0.38 MLS (38 UNITS TOTAL) INTO THE SKIN EVERY NIGHT AT BEDTIME 12 pen 2  . lisinopril (ZESTRIL) 10 MG tablet TAKE ONE TABLET BY MOUTH DAILY 90 tablet 1  . metFORMIN (GLUCOPHAGE) 1000 MG tablet TAKE ONE TABLET BY MOUTH TWICE A DAY WITH FOOD 180 tablet 1  . Misc. Devices MISC New CPAP machine with mask and supplies through [facility].  CPAP therapy at 14 cm. water pressure.    . ONE TOUCH ULTRA TEST test strip TEST as directed 100 each 3  . PARoxetine (PAXIL) 20 MG tablet Take 1 tablet (20 mg total) by mouth daily. 30 tablet 0  . tamsulosin (FLOMAX) 0.4 MG CAPS capsule Take 1 capsule (0.4 mg total) by mouth daily after supper. 30 capsule 5  . venlafaxine (EFFEXOR) 37.5 MG tablet Take 1 tablet (37.5 mg total) by mouth daily. 30 tablet 1   No current facility-administered medications for this visit.       Psychiatric Specialty Exam: Review of Systems  Cardiovascular: Negative for chest pain.  Skin: Negative for rash.  Psychiatric/Behavioral: Negative for suicidal ideas.    There were no vitals taken for this  visit.There is no height or weight on file to calculate BMI.  General Appearance:   Eye Contact:    Speech:  Slow  Volume:  Decreased  Mood: fair  Affect:  congruent  Thought Process:  Goal Directed  Orientation:  Full (Time, Place, and  Person)  Thought Content:  Rumination  Suicidal Thoughts:  No  Homicidal Thoughts:  No  Memory:  Immediate;   Fair Recent;   Fair  Judgement:  Poor  Insight:  Shallow  Psychomotor Activity:  Decreased  Concentration:  Concentration: Fair and Attention Span: Fair  Recall:  [employer] of Knowledge:Fair  Language: Fair  Akathisia:  Negative  Handed:  Right  AIMS (if indicated):  0  Assets:  Desire for Improvement  ADL's:  Intact  Cognition: WNL  Sleep:  fair    Treatment Plan Summary: Medication management and Plan as follows   1. Major depression moderate recurrent: baseline. Will lower paxil as he feels its not helping irritable bowel. Change to 20mg , will add effexor 37.5mg  and may cross taper later if needed more 3. Panic attacks:sporadic, on xanax, avoid nicotine 4. Nicotine dependence: now taking e cig. Working on cutting down Continue to work on He does not feel veggie diet has contributed to irritable. Bowel. Also fu with primary care if gets worse or other associated symptoms  I discussed the assessment and treatment plan with the patient. The patient was provided an opportunity to ask questions and all were answered. The patient agreed with the plan and demonstrated an understanding of the instructions.   The patient was advised to call back or seek an in-person evaluation if the symptoms worsen or if the condition fails to improve as anticipated.  I provided 15 minutes of non-face-to-face time during this encounter. Fu 78m. Wants to come in 42m. Can call earlier  3m, MD 10/20/20204:35 PM

## 2019-09-05 ENCOUNTER — Other Ambulatory Visit: Payer: Self-pay | Admitting: Family Medicine

## 2019-09-09 DIAGNOSIS — R197 Diarrhea, unspecified: Secondary | ICD-10-CM | POA: Diagnosis not present

## 2019-09-10 ENCOUNTER — Other Ambulatory Visit: Payer: Self-pay | Admitting: Physician Assistant

## 2019-09-14 ENCOUNTER — Other Ambulatory Visit: Payer: Self-pay | Admitting: Family Medicine

## 2019-09-16 ENCOUNTER — Telehealth (HOSPITAL_COMMUNITY): Payer: Self-pay

## 2019-09-16 MED ORDER — ALPRAZOLAM 0.5 MG PO TABS: ORAL_TABLET | ORAL | 0 refills | Status: DC

## 2019-09-16 NOTE — Telephone Encounter (Signed)
Patient called for a refill on Alprazolam. Gabriel Gilbert in [place]

## 2019-09-16 NOTE — Telephone Encounter (Signed)
sent 

## 2019-10-06 ENCOUNTER — Other Ambulatory Visit (HOSPITAL_COMMUNITY): Payer: Self-pay | Admitting: Psychiatry

## 2019-10-16 ENCOUNTER — Telehealth (HOSPITAL_COMMUNITY): Payer: Self-pay | Admitting: Psychiatry

## 2019-10-16 MED ORDER — ALPRAZOLAM 0.5 MG PO TABS: ORAL_TABLET | ORAL | 0 refills | Status: DC

## 2019-10-16 NOTE — Telephone Encounter (Signed)
Pt needs refill on xanax sent to [facility]

## 2019-10-16 NOTE — Telephone Encounter (Signed)
sent 

## 2019-10-30 ENCOUNTER — Encounter (HOSPITAL_COMMUNITY): Payer: Self-pay | Admitting: Psychiatry

## 2019-10-30 ENCOUNTER — Ambulatory Visit (INDEPENDENT_AMBULATORY_CARE_PROVIDER_SITE_OTHER): Payer: Medicare HMO | Admitting: Psychiatry

## 2019-10-30 DIAGNOSIS — F411 Generalized anxiety disorder: Secondary | ICD-10-CM | POA: Diagnosis not present

## 2019-10-30 DIAGNOSIS — F410 Panic disorder [episodic paroxysmal anxiety] without agoraphobia: Secondary | ICD-10-CM | POA: Diagnosis not present

## 2019-10-30 DIAGNOSIS — F331 Major depressive disorder, recurrent, moderate: Secondary | ICD-10-CM | POA: Diagnosis not present

## 2019-10-30 DIAGNOSIS — F17290 Nicotine dependence, other tobacco product, uncomplicated: Secondary | ICD-10-CM | POA: Diagnosis not present

## 2019-10-30 DIAGNOSIS — R69 Illness, unspecified: Secondary | ICD-10-CM | POA: Diagnosis not present

## 2019-10-30 NOTE — Progress Notes (Signed)
[facility]  Telepsych Patient Identification: Gabriel Gilbert MRN:  [id number] Date of Evaluation:  10/30/2019 Referral Source: primary care.  Chief Complaint:   depression follow up  Visit Diagnosis:    ICD-10-CM   1. Major depressive disorder, recurrent episode, moderate (HCC)  F33.1   2. GAD (generalized anxiety disorder)  F41.1   3. Panic disorder  F41.0     History of Present Illness:  68  years old single white male. Referred initially for management of depression   I connected with Gabriel Gilbert on 10/30/19 at  1:00 PM EST by telephone and verified that I am speaking with the correct person using two identifiers.   I discussed the limitations, risks, security and privacy concerns of performing an evaluation and management service by telephone and the availability of in person appointments. I also discussed with the patient that there may be a patient responsible charge related to this service. The patient expressed understanding and agreed to proceed.   Doing baseline, keeps to himself mostly Xanax helps anxiety  paxil helps depression  Denies using alcohol or drugs Panic attacks sporadic around people   Aggravating factor:: lonliness ,modifying factor: dog. Some friends.  Timing in morning   Past Psychiatric History: depression ,anxiety  Previous Psychotropic Medications: Yes   Substance Abuse History in the last 12 months:  No.  Consequences of Substance Abuse: NA  Past Medical History:  Past Medical History:  Diagnosis Date  . Bell's palsy 12-10  . Depression   . Diabetes mellitus    type 2  . Hyperlipidemia   . Hypertension     Past Surgical History:  Procedure Laterality Date  . CATARACT EXTRACTION Left   . kidney stone removed    . pfts     normal    Family Psychiatric History: anxiety. Mom used valium  Family History:  Family History  Problem Relation Age of Onset  . COPD Mother   . Hypertension Mother   .  Alzheimer's disease Mother        ?  . Diabetes Father   . COPD Father   . Drug abuse Son   . COPD Sister   . Cancer Sister        lung    Social History:   Social History   Socioeconomic History  . Marital status: Single    Spouse name: Not on file  . Number of children: Not on file  . Years of education: Not on file  . Highest education level: Not on file  Occupational History  . Not on file  Tobacco Use  . Smoking status: Current Every Day Smoker    Packs/day: 1.00    Years: 25.00    Pack years: 25.00    Types: Cigarettes    Last attempt to quit: 02/10/2013    Years since quitting: 6.7  . Smokeless tobacco: Never Used  . Tobacco comment: using electronic cigarettes  Substance and Sexual Activity  . Alcohol use: No    Alcohol/week: 0.0 standard drinks  . Drug use: No  . Sexual activity: Never    Partners: Female  Other Topics Concern  . Not on file  Social History Narrative  . Not on file   Social Determinants of Health   Financial Resource Strain:   . Difficulty of Paying Living Expenses: Not on file  Food Insecurity:   . Worried About Programme researcher, broadcasting/film/videounning Out of Food in the Last Year: Not on file  .  Ran Out of Food in the Last Year: Not on file  Transportation Needs:   . Lack of Transportation (Medical): Not on file  . Lack of Transportation (Non-Medical): Not on file  Physical Activity:   . Days of Exercise per Week: Not on file  . Minutes of Exercise per Session: Not on file  Stress:   . Feeling of Stress : Not on file  Social Connections:   . Frequency of Communication with Friends and Family: Not on file  . Frequency of Social Gatherings with Friends and Family: Not on file  . Attends Religious Services: Not on file  . Active Member of Clubs or Organizations: Not on file  . Attends Banker Meetings: Not on file  . Marital Status: Not on file      Allergies:  No Known Allergies  Metabolic Disorder Labs: Lab Results  Component Value Date    HGBA1C 5.6 08/24/2019   No results found for: PROLACTIN Lab Results  Component Value Date   CHOL 73 08/25/2019   TRIG 145 08/25/2019   HDL 33 (L) 08/25/2019   CHOLHDL 2.2 08/25/2019   VLDL 22 01/17/2016   LDLCALC 17 08/25/2019   LDLCALC 90 05/16/2018     Current Medications: Current Outpatient Medications  Medication Sig Dispense Refill  . ALPRAZolam (XANAX) 0.5 MG tablet TAKE ONE TABLET BY MOUTH DAILY AS NEEDED FOR ANXIETY 30 tablet 0  . aspirin 81 MG tablet Take 81 mg by mouth daily.      Marland Kitchen atorvastatin (LIPITOR) 40 MG tablet TAKE ONE TABLET BY MOUTH EVERY NIGHT AT BEDTIME 90 tablet 2  . B-D UF III MINI PEN NEEDLES 31G X 5 MM MISC USE ONCE DAILY 100 each 10  . glucose blood (ONE TOUCH TEST STRIPS) test strip Pt testing one time a day. 100 each 3  . Insulin Glargine (BASAGLAR KWIKPEN) 100 UNIT/ML SOPN INJECT 38 UNITS TOTAL INTO THE SKIN EVERY NIGHT AT BEDTIME 12 pen 1  . JARDIANCE 10 MG TABS tablet TAKE ONE TABLET BY MOUTH DAILY 30 tablet 3  . lisinopril (ZESTRIL) 10 MG tablet TAKE ONE TABLET BY MOUTH DAILY 90 tablet 1  . metFORMIN (GLUCOPHAGE) 1000 MG tablet TAKE ONE TABLET BY MOUTH TWICE A DAY WITH FOOD 180 tablet 0  . Misc. Devices MISC New CPAP machine with mask and supplies through [facility].  CPAP therapy at 14 cm. water pressure.    . ONE TOUCH ULTRA TEST test strip TEST as directed 100 each 3  . PARoxetine (PAXIL) 20 MG tablet TAKE ONE TABLET BY MOUTH DAILY 30 tablet 0  . tamsulosin (FLOMAX) 0.4 MG CAPS capsule Take 1 capsule (0.4 mg total) by mouth daily after supper. 30 capsule 5  . venlafaxine (EFFEXOR) 37.5 MG tablet Take 1 tablet (37.5 mg total) by mouth daily. 30 tablet 1   No current facility-administered medications for this visit.      Psychiatric Specialty Exam: Review of Systems  Cardiovascular: Negative for chest pain.  Skin: Negative for rash.  Psychiatric/Behavioral: Negative for suicidal ideas.    There were no vitals taken for this  visit.There is no height or weight on file to calculate BMI.  General Appearance:   Eye Contact:    Speech:  Slow  Volume:  Decreased  Mood: fair  Affect:  congruent  Thought Process:  Goal Directed  Orientation:  Full (Time, Place, and Person)  Thought Content:  Rumination  Suicidal Thoughts:  No  Homicidal Thoughts:  No  Memory:  Immediate;   Fair Recent;   Fair  Judgement:  Poor  Insight:  Shallow  Psychomotor Activity:  Decreased  Concentration:  Concentration: Fair and Attention Span: Fair  Recall:  [employer] of Knowledge:Fair  Language: Fair  Akathisia:  Negative  Handed:  Right  AIMS (if indicated):  0  Assets:  Desire for Improvement  ADL's:  Intact  Cognition: WNL  Sleep:  fair    Treatment Plan Summary: Medication management and Plan as follows   1. Major depression moderate recurrent: baseline. Continue paxil and effexor Consider therapy again for lonliness and coping skills 3. Panic attacks:sporadic, on xanax, avoid nicotine 4. Nicotine dependence: now taking e cig. Working on cutting down  I discussed the assessment and treatment plan with the patient. The patient was provided an opportunity to ask questions and all were answered. The patient agreed with the plan and demonstrated an understanding of the instructions.   The patient was advised to call back or seek an in-person evaluation if the symptoms worsen or if the condition fails to improve as anticipated.  I provided 15 minutes of non-face-to-face time during this encounter. Fu 2-3 m. Call for refills.   Merian Capron, MD 12/18/20201:03 PM

## 2019-11-12 ENCOUNTER — Telehealth (HOSPITAL_COMMUNITY): Payer: Self-pay | Admitting: Psychiatry

## 2019-11-12 MED ORDER — ALPRAZOLAM 0.5 MG PO TABS: ORAL_TABLET | ORAL | 0 refills | Status: DC

## 2019-11-12 NOTE — Telephone Encounter (Signed)
Pt needs refill on xanax sent [facility]

## 2019-11-23 ENCOUNTER — Telehealth (HOSPITAL_COMMUNITY): Payer: Self-pay

## 2019-11-23 MED ORDER — TRAZODONE HCL 50 MG PO TABS: 50.0000 mg | ORAL_TABLET | Freq: Every day | ORAL | 0 refills | Status: DC

## 2019-11-23 NOTE — Telephone Encounter (Signed)
Ok send trazadone 50mg  qhs instead

## 2019-11-23 NOTE — Telephone Encounter (Signed)
Sent a one month supply Trazodone 50mg  per Dr. . Informed patient.

## 2019-11-23 NOTE — Telephone Encounter (Signed)
Patient wants to know if he can try another anti-depressant medication because the one he is on is not working to well for him. Please advise.

## 2019-11-23 NOTE — Telephone Encounter (Signed)
Called patient back and now he says he is having trouble with both sleep and depression and would like to try Trazodone instead of the Wellbutrin. Please advise.

## 2019-11-23 NOTE — Telephone Encounter (Signed)
We can add wellbutrin 100mg  sr.  He need to continue paxil as well

## 2019-11-24 ENCOUNTER — Ambulatory Visit (INDEPENDENT_AMBULATORY_CARE_PROVIDER_SITE_OTHER): Payer: Medicare HMO | Admitting: Family Medicine

## 2019-11-24 ENCOUNTER — Encounter: Payer: Self-pay | Admitting: Family Medicine

## 2019-11-24 VITALS — BP 117/58 | HR 89 | Ht 66.0 in | Wt 180.0 lb

## 2019-11-24 DIAGNOSIS — Z1211 Encounter for screening for malignant neoplasm of colon: Secondary | ICD-10-CM

## 2019-11-24 DIAGNOSIS — I10 Essential (primary) hypertension: Secondary | ICD-10-CM

## 2019-11-24 DIAGNOSIS — Z794 Long term (current) use of insulin: Secondary | ICD-10-CM | POA: Diagnosis not present

## 2019-11-24 DIAGNOSIS — E11630 Type 2 diabetes mellitus with periodontal disease: Secondary | ICD-10-CM | POA: Diagnosis not present

## 2019-11-24 LAB — POCT GLYCOSYLATED HEMOGLOBIN (HGB A1C): Hemoglobin A1C: 6.2 % — AB (ref 4.0–5.6)

## 2019-11-24 NOTE — Assessment & Plan Note (Signed)
A1c up a little bit from previous now at 6.2.  Continue current regimen.  Continue to monitor diet and stay active continue with walking.  Follow-up in 4 months.

## 2019-11-24 NOTE — Assessment & Plan Note (Signed)
Well controlled. Continue current regimen. Follow up in  4 months.   

## 2019-11-24 NOTE — Progress Notes (Signed)
Established Patient Office Visit  Subjective:  Patient ID: Gabriel Gilbert, male    DOB: 07-07-1951  Age: 69 y.o. MRN: [id number]  CC:  Chief Complaint  Patient presents with  . Hypertension  . Diabetes    HPI KACEN MELLINGER presents for   Diabetes - no hypoglycemic events. No wounds or sores that are not healing well. No increased thirst or urination. Checking glucose at home. Taking medications as prescribed without any side effects.  Has been walking regularly for exercise.   Hypertension- Pt denies chest pain, SOB, dizziness, or heart palpitations.  Taking meds as directed w/o problems.  Denies medication side effects.     Past Medical History:  Diagnosis Date  . Bell's palsy 12-10  . Depression   . Diabetes mellitus    type 2  . Hyperlipidemia   . Hypertension     Past Surgical History:  Procedure Laterality Date  . CATARACT EXTRACTION Left   . kidney stone removed    . pfts     normal    Family History  Problem Relation Age of Onset  . COPD Mother   . Hypertension Mother   . Alzheimer's disease Mother        ?  . Diabetes Father   . COPD Father   . Drug abuse Son   . COPD Sister   . Cancer Sister        lung    Social History   Socioeconomic History  . Marital status: Single    Spouse name: Not on file  . Number of children: Not on file  . Years of education: Not on file  . Highest education level: Not on file  Occupational History  . Not on file  Tobacco Use  . Smoking status: Current Every Day Smoker    Packs/day: 1.00    Years: 25.00    Pack years: 25.00    Types: Cigarettes    Last attempt to quit: 02/10/2013    Years since quitting: 6.7  . Smokeless tobacco: Never Used  . Tobacco comment: using electronic cigarettes  Substance and Sexual Activity  . Alcohol use: No    Alcohol/week: 0.0 standard drinks  . Drug use: No  . Sexual activity: Never    Partners: Female  Other Topics Concern  . Not on file  Social History Narrative   . Not on file   Social Determinants of Health   Financial Resource Strain:   . Difficulty of Paying Living Expenses: Not on file  Food Insecurity:   . Worried About Programme researcher, broadcasting/film/video in the Last Year: Not on file  . Ran Out of Food in the Last Year: Not on file  Transportation Needs:   . Lack of Transportation (Medical): Not on file  . Lack of Transportation (Non-Medical): Not on file  Physical Activity:   . Days of Exercise per Week: Not on file  . Minutes of Exercise per Session: Not on file  Stress:   . Feeling of Stress : Not on file  Social Connections:   . Frequency of Communication with Friends and Family: Not on file  . Frequency of Social Gatherings with Friends and Family: Not on file  . Attends Religious Services: Not on file  . Active Member of Clubs or Organizations: Not on file  . Attends Banker Meetings: Not on file  . Marital Status: Not on file  Intimate Partner Violence:   . Fear of Current or  Ex-Partner: Not on file  . Emotionally Abused: Not on file  . Physically Abused: Not on file  . Sexually Abused: Not on file    Outpatient Medications Prior to Visit  Medication Sig Dispense Refill  . ALPRAZolam (XANAX) 0.5 MG tablet TAKE ONE TABLET BY MOUTH DAILY AS NEEDED FOR ANXIETY 30 tablet 0  . aspirin 81 MG tablet Take 81 mg by mouth daily.      Marland Kitchen atorvastatin (LIPITOR) 40 MG tablet TAKE ONE TABLET BY MOUTH EVERY NIGHT AT BEDTIME 90 tablet 2  . B-D UF III MINI PEN NEEDLES 31G X 5 MM MISC USE ONCE DAILY 100 each 10  . glucose blood (ONE TOUCH TEST STRIPS) test strip Pt testing one time a day. 100 each 3  . Insulin Glargine (BASAGLAR KWIKPEN) 100 UNIT/ML SOPN INJECT 38 UNITS TOTAL INTO THE SKIN EVERY NIGHT AT BEDTIME 12 pen 1  . JARDIANCE 10 MG TABS tablet TAKE ONE TABLET BY MOUTH DAILY 30 tablet 3  . lisinopril (ZESTRIL) 10 MG tablet TAKE ONE TABLET BY MOUTH DAILY 90 tablet 1  . metFORMIN (GLUCOPHAGE) 1000 MG tablet TAKE ONE TABLET BY MOUTH  TWICE A DAY WITH FOOD 180 tablet 0  . Misc. Devices MISC New CPAP machine with mask and supplies through [facility].  CPAP therapy at 14 cm. water pressure.    . ONE TOUCH ULTRA TEST test strip TEST as directed 100 each 3  . PARoxetine (PAXIL) 20 MG tablet TAKE ONE TABLET BY MOUTH DAILY 30 tablet 0  . tamsulosin (FLOMAX) 0.4 MG CAPS capsule Take 1 capsule (0.4 mg total) by mouth daily after supper. 30 capsule 5  . traZODone (DESYREL) 50 MG tablet Take 1 tablet (50 mg total) by mouth at bedtime. 30 tablet 0  . venlafaxine (EFFEXOR) 37.5 MG tablet Take 1 tablet (37.5 mg total) by mouth daily. 30 tablet 1   No facility-administered medications prior to visit.    No Known Allergies  ROS Review of Systems    Objective:    Physical Exam  BP (!) 117/58   Pulse 89   Ht 5\' 6"  (1.676 m)   Wt 180 lb (81.6 kg)   SpO2 97%   BMI 29.05 kg/m  Wt Readings from Last 3 Encounters:  11/24/19 180 lb (81.6 kg)  08/24/19 180 lb (81.6 kg)  07/29/19 184 lb (83.5 kg)     Health Maintenance Due  Topic Date Due  . COLONOSCOPY  04/25/2016    There are no preventive care reminders to display for this patient.  Lab Results  Component Value Date   TSH 1.580 01/25/2015   Lab Results  Component Value Date   WBC 15.2 (H) 07/10/2019   HGB 16.5 07/10/2019   HCT 49.5 07/10/2019   MCV 93.9 07/10/2019   PLT 333 07/10/2019   Lab Results  Component Value Date   NA 138 08/25/2019   K 5.0 08/25/2019   CO2 22 08/25/2019   GLUCOSE 172 (H) 08/25/2019   BUN 12 08/25/2019   CREATININE 0.85 08/25/2019   BILITOT 0.5 08/25/2019   ALKPHOS 61 06/06/2017   AST 12 08/25/2019   ALT 6 (L) 08/25/2019   PROT 6.4 08/25/2019   ALBUMIN 4.1 06/06/2017   CALCIUM 9.3 08/25/2019   Lab Results  Component Value Date   CHOL 73 08/25/2019   Lab Results  Component Value Date   HDL 33 (L) 08/25/2019   Lab Results  Component Value Date   LDLCALC 17 08/25/2019  Lab Results  Component Value Date    TRIG 145 08/25/2019   Lab Results  Component Value Date   CHOLHDL 2.2 08/25/2019   Lab Results  Component Value Date   HGBA1C 6.2 (A) 11/24/2019      Assessment & Plan:   Problem List Items Addressed This Visit      Cardiovascular and Mediastinum   Essential hypertension, benign    Well controlled. Continue current regimen. Follow up in  4 months.         Endocrine   Type 2 diabetes mellitus with periodontal complication (HCC) - Primary    A1c up a little bit from previous now at 6.2.  Continue current regimen.  Continue to monitor diet and stay active continue with walking.  Follow-up in 4 months.      Relevant Orders   POCT HgB A1C (Completed)    Other Visit Diagnoses    Screening for malignant neoplasm of colon       Relevant Orders   Ambulatory referral to Gastroenterology      No orders of the defined types were placed in this encounter.   Follow-up: Return in about 4 months (around 03/23/2020) for Diabetes follow-up, Hypertension and labs.    Nani Gasser, MD

## 2019-11-30 ENCOUNTER — Ambulatory Visit (HOSPITAL_COMMUNITY): Payer: Medicare HMO | Admitting: Licensed Clinical Social Worker

## 2019-12-02 ENCOUNTER — Other Ambulatory Visit (HOSPITAL_COMMUNITY): Payer: Self-pay | Admitting: Psychiatry

## 2019-12-11 ENCOUNTER — Other Ambulatory Visit: Payer: Self-pay | Admitting: Family Medicine

## 2019-12-14 ENCOUNTER — Other Ambulatory Visit (HOSPITAL_COMMUNITY): Payer: Self-pay

## 2019-12-14 MED ORDER — ALPRAZOLAM 0.5 MG PO TABS: ORAL_TABLET | ORAL | 1 refills | Status: DC

## 2019-12-25 ENCOUNTER — Other Ambulatory Visit: Payer: Self-pay | Admitting: Family Medicine

## 2020-01-04 ENCOUNTER — Other Ambulatory Visit (HOSPITAL_COMMUNITY): Payer: Self-pay | Admitting: Psychiatry

## 2020-01-08 ENCOUNTER — Telehealth (HOSPITAL_COMMUNITY): Payer: Self-pay | Admitting: Psychiatry

## 2020-01-08 MED ORDER — ALPRAZOLAM 0.5 MG PO TABS: ORAL_TABLET | ORAL | 0 refills | Status: DC

## 2020-01-08 NOTE — Telephone Encounter (Signed)
Pt needs refill on xanax sent to [facility]  The last rx that was sent, look like it was not done correctly.

## 2020-01-08 NOTE — Telephone Encounter (Signed)
Ok sent!

## 2020-01-14 ENCOUNTER — Other Ambulatory Visit: Payer: Self-pay | Admitting: Family Medicine

## 2020-01-15 ENCOUNTER — Encounter: Payer: Self-pay | Admitting: Family Medicine

## 2020-01-15 ENCOUNTER — Ambulatory Visit (INDEPENDENT_AMBULATORY_CARE_PROVIDER_SITE_OTHER): Payer: Medicare HMO | Admitting: Family Medicine

## 2020-01-15 ENCOUNTER — Other Ambulatory Visit: Payer: Self-pay

## 2020-01-15 DIAGNOSIS — I10 Essential (primary) hypertension: Secondary | ICD-10-CM | POA: Diagnosis not present

## 2020-01-15 DIAGNOSIS — F17200 Nicotine dependence, unspecified, uncomplicated: Secondary | ICD-10-CM

## 2020-01-15 DIAGNOSIS — R69 Illness, unspecified: Secondary | ICD-10-CM | POA: Diagnosis not present

## 2020-01-15 MED ORDER — LISINOPRIL 20 MG PO TABS: 20.0000 mg | ORAL_TABLET | Freq: Every day | ORAL | 1 refills | Status: DC

## 2020-01-15 NOTE — Patient Instructions (Signed)
Nice to meet you today! Increase lisinopril to 20mg .  I have sent in a new prescription for this.  Work on cutting back and eventually quitting smoking.  Follow up in 2 weeks for a nurse visit to recheck BP.

## 2020-01-15 NOTE — Assessment & Plan Note (Signed)
Blood pressure is not at goal at for age and co-morbidities.  I recommend that he increase lisinopril to 20mg  daily.  In addition they were instructed to follow a low sodium diet with regular exercise to help to maintain adequate control of blood pressure.   Return in 2 weeks for nurse visit for BP check.

## 2020-01-15 NOTE — Progress Notes (Signed)
Gabriel Gilbert - 69 y.o. male MRN [id number]  Date of birth: [birth year]  Subjective Chief Complaint  Patient presents with  . Hypertension    HPI Gabriel Gilbert is a 69 y.o. male with history of HTN, T2DM and nicotine dependence here today with concern about his blood pressure.  He reports that he monitors BP at [place] a few days per week and has noticed that the past few readings have been elevated. He denies changes to his diet.  He has been smoking a little more.  He has not had chest pain,shortness of breath, palpitations, headache or vision changes.   ROS:  A comprehensive ROS was completed and negative except as noted per HPI  No Known Allergies  Past Medical History:  Diagnosis Date  . Bell's palsy 12-10  . Depression   . Diabetes mellitus    type 2  . Hyperlipidemia   . Hypertension     Past Surgical History:  Procedure Laterality Date  . CATARACT EXTRACTION Left   . kidney stone removed    . pfts     normal    Social History   Socioeconomic History  . Marital status: Single    Spouse name: Not on file  . Number of children: Not on file  . Years of education: Not on file  . Highest education level: Not on file  Occupational History  . Not on file  Tobacco Use  . Smoking status: Current Every Day Smoker    Packs/day: 1.00    Years: 25.00    Pack years: 25.00    Types: Cigarettes    Last attempt to quit: 02/10/2013    Years since quitting: 6.9  . Smokeless tobacco: Never Used  . Tobacco comment: using electronic cigarettes  Substance and Sexual Activity  . Alcohol use: No    Alcohol/week: 0.0 standard drinks  . Drug use: No  . Sexual activity: Never    Partners: Female  Other Topics Concern  . Not on file  Social History Narrative  . Not on file   Social Determinants of Health   Financial Resource Strain:   . Difficulty of Paying Living Expenses: Not on file  Food Insecurity:   . Worried About Charity fundraiser in the Last Year:  Not on file  . Ran Out of Food in the Last Year: Not on file  Transportation Needs:   . Lack of Transportation (Medical): Not on file  . Lack of Transportation (Non-Medical): Not on file  Physical Activity:   . Days of Exercise per Week: Not on file  . Minutes of Exercise per Session: Not on file  Stress:   . Feeling of Stress : Not on file  Social Connections:   . Frequency of Communication with Friends and Family: Not on file  . Frequency of Social Gatherings with Friends and Family: Not on file  . Attends Religious Services: Not on file  . Active Member of Clubs or Organizations: Not on file  . Attends Archivist Meetings: Not on file  . Marital Status: Not on file    Family History  Problem Relation Age of Onset  . COPD Mother   . Hypertension Mother   . Alzheimer's disease Mother        ?  . Diabetes Father   . COPD Father   . Drug abuse Son   . COPD Sister   . Cancer Sister        lung  Health Maintenance  Topic Date Due  . OPHTHALMOLOGY EXAM  07/11/2012  . COLONOSCOPY  04/25/2016  . HEMOGLOBIN A1C  05/23/2020  . FOOT EXAM  11/23/2020  . TETANUS/TDAP  04/27/2026  . INFLUENZA VACCINE  Completed  . Hepatitis C Screening  Completed  . PNA vac Low Risk Adult  Completed     ----------------------------------------------------------------------------------------------------------------------------------------------------------------------------------------------------------------- Physical Exam BP (!) 145/71   Pulse 67   Ht 5\' 6"  (1.676 m)   Wt 180 lb (81.6 kg)   BMI 29.05 kg/m   Physical Exam Constitutional:      Appearance: Normal appearance.  HENT:     Head: Normocephalic and atraumatic.  Eyes:     General: No scleral icterus. Cardiovascular:     Rate and Rhythm: Normal rate and regular rhythm.  Pulmonary:     Effort: Pulmonary effort is normal.     Breath sounds: Normal breath sounds.  Musculoskeletal:     Cervical back: Neck supple.   Skin:    General: Skin is warm and dry.  Neurological:     General: No focal deficit present.     Mental Status: He is alert.  Psychiatric:        Mood and Affect: Mood normal.     ------------------------------------------------------------------------------------------------------------------------------------------------------------------------------------------------------------------- Assessment and Plan  Essential Hypertension, Benign Blood pressure is not at goal at for age and co-morbidities.  I recommend that he increase lisinopril to 20mg  daily.  In addition they were instructed to follow a low sodium diet with regular exercise to help to maintain adequate control of blood pressure.   Return in 2 weeks for nurse visit for BP check.    TOBACCO DEPENDENCE He was counseled on smoking cessation. Recommend cutting back with goal of quitting.  Declines medications to help with this at this time.    Meds ordered this encounter  Medications  . lisinopril (ZESTRIL) 20 MG tablet    Sig: Take 1 tablet (20 mg total) by mouth daily.    Dispense:  90 tablet    Refill:  1    Return in about 2 weeks (around 01/29/2020) for Nurse visit for BP check.    This visit occurred during the SARS-CoV-2 public health emergency.  Safety protocols were in place, including screening questions prior to the visit, additional usage of staff PPE, and extensive cleaning of exam room while observing appropriate contact time as indicated for disinfecting solutions.

## 2020-01-15 NOTE — Assessment & Plan Note (Signed)
He was counseled on smoking cessation. Recommend cutting back with goal of quitting.  Declines medications to help with this at this time.

## 2020-01-21 ENCOUNTER — Other Ambulatory Visit: Payer: Self-pay | Admitting: Family Medicine

## 2020-01-29 ENCOUNTER — Ambulatory Visit (INDEPENDENT_AMBULATORY_CARE_PROVIDER_SITE_OTHER): Payer: Medicare HMO | Admitting: Family Medicine

## 2020-01-29 ENCOUNTER — Other Ambulatory Visit: Payer: Self-pay

## 2020-01-29 VITALS — BP 124/70 | HR 88 | Temp 98.0°F | Wt 175.0 lb

## 2020-01-29 DIAGNOSIS — I10 Essential (primary) hypertension: Secondary | ICD-10-CM | POA: Diagnosis not present

## 2020-01-29 NOTE — Progress Notes (Signed)
Agree with documentation as above.   Geraldy Akridge, MD  

## 2020-01-29 NOTE — Progress Notes (Signed)
Pt presented for NV for blood pressure check. Denies chest pains, palpitations, SOB, h/a's, lightheadedness or medications problems. Pt confirmed and is taking all daily medications as directed. No miss dose. Pt did not require med refill for bp med. Today's blood pressure reading was 124/70, 88. Pt advised to make next appointment for follow up with provider.

## 2020-02-04 ENCOUNTER — Encounter (HOSPITAL_COMMUNITY): Payer: Self-pay | Admitting: Psychiatry

## 2020-02-04 ENCOUNTER — Ambulatory Visit (INDEPENDENT_AMBULATORY_CARE_PROVIDER_SITE_OTHER): Payer: Medicare HMO | Admitting: Psychiatry

## 2020-02-04 ENCOUNTER — Other Ambulatory Visit: Payer: Self-pay

## 2020-02-04 ENCOUNTER — Ambulatory Visit (INDEPENDENT_AMBULATORY_CARE_PROVIDER_SITE_OTHER): Payer: Medicare HMO | Admitting: Family Medicine

## 2020-02-04 ENCOUNTER — Ambulatory Visit (HOSPITAL_COMMUNITY): Payer: Medicare HMO | Admitting: Psychiatry

## 2020-02-04 ENCOUNTER — Encounter: Payer: Self-pay | Admitting: Family Medicine

## 2020-02-04 VITALS — BP 108/52 | HR 85 | Ht 66.0 in | Wt 175.0 lb

## 2020-02-04 DIAGNOSIS — F411 Generalized anxiety disorder: Secondary | ICD-10-CM | POA: Diagnosis not present

## 2020-02-04 DIAGNOSIS — F410 Panic disorder [episodic paroxysmal anxiety] without agoraphobia: Secondary | ICD-10-CM | POA: Diagnosis not present

## 2020-02-04 DIAGNOSIS — R202 Paresthesia of skin: Secondary | ICD-10-CM | POA: Diagnosis not present

## 2020-02-04 DIAGNOSIS — M25512 Pain in left shoulder: Secondary | ICD-10-CM | POA: Diagnosis not present

## 2020-02-04 DIAGNOSIS — R69 Illness, unspecified: Secondary | ICD-10-CM | POA: Diagnosis not present

## 2020-02-04 DIAGNOSIS — F17208 Nicotine dependence, unspecified, with other nicotine-induced disorders: Secondary | ICD-10-CM | POA: Diagnosis not present

## 2020-02-04 DIAGNOSIS — F331 Major depressive disorder, recurrent, moderate: Secondary | ICD-10-CM | POA: Diagnosis not present

## 2020-02-04 MED ORDER — PREDNISONE 20 MG PO TABS: 40.0000 mg | ORAL_TABLET | Freq: Every day | ORAL | 0 refills | Status: DC

## 2020-02-04 MED ORDER — PAROXETINE HCL 20 MG PO TABS: 20.0000 mg | ORAL_TABLET | Freq: Every day | ORAL | 0 refills | Status: DC

## 2020-02-04 NOTE — Progress Notes (Signed)
Acute Office Visit  Subjective:    Patient ID: Gabriel Gilbert, male    DOB: 09-09-1951, 69 y.o.   MRN: [id number]  Chief Complaint  Patient presents with  . Shoulder Pain    L shoulder x 3 wk.denies inj/trauma    HPI Patient is in today for left shoulder pain x 3 weeks.  He does not remember any specific injury or trauma.  I will have any prior imaging on file.  He says he is able to reach upward and extend the arm but has difficulty reaching back.  Says it is painful to sleep on that side so has been trying to sleep on his right side and when he sleeps on his right side he says he feels a little bit nauseated.  Not tried any heat, ice, or anti-inflammatory.  Also complains of his entire left hand going numb only at night while sleeping.  He says normally he will rest his hand on his chest and that is when he notices it.  If he falls asleep with his hand outstretched it does not happen.  He says it only last for about 5 or so minutes once he wakes up.  But then once he wakes up he cannot go back to sleep.  He feels like that in the shoulder are really disruptive to his sleep right now.  Again he has not tried any treatments.  Past Medical History:  Diagnosis Date  . Bell's palsy 12-10  . Depression   . Diabetes mellitus    type 2  . Hyperlipidemia   . Hypertension     Past Surgical History:  Procedure Laterality Date  . CATARACT EXTRACTION Left   . kidney stone removed    . pfts     normal    Family History  Problem Relation Age of Onset  . COPD Mother   . Hypertension Mother   . Alzheimer's disease Mother        ?  . Diabetes Father   . COPD Father   . Drug abuse Son   . COPD Sister   . Cancer Sister        lung    Social History   Socioeconomic History  . Marital status: Single    Spouse name: Not on file  . Number of children: Not on file  . Years of education: Not on file  . Highest education level: Not on file  Occupational History  . Not on file   Tobacco Use  . Smoking status: Current Every Day Smoker    Packs/day: 1.00    Years: 25.00    Pack years: 25.00    Types: Cigarettes    Last attempt to quit: 02/10/2013    Years since quitting: 6.9  . Smokeless tobacco: Never Used  . Tobacco comment: using electronic cigarettes  Substance and Sexual Activity  . Alcohol use: No    Alcohol/week: 0.0 standard drinks  . Drug use: No  . Sexual activity: Never    Partners: Female  Other Topics Concern  . Not on file  Social History Narrative  . Not on file   Social Determinants of Health   Financial Resource Strain:   . Difficulty of Paying Living Expenses:   Food Insecurity:   . Worried About Programme researcher, broadcasting/film/video in the Last Year:   . Barista in the Last Year:   Transportation Needs:   . Freight forwarder (Medical):   Marland Kitchen Lack  of Transportation (Non-Medical):   Physical Activity:   . Days of Exercise per Week:   . Minutes of Exercise per Session:   Stress:   . Feeling of Stress :   Social Connections:   . Frequency of Communication with Friends and Family:   . Frequency of Social Gatherings with Friends and Family:   . Attends Religious Services:   . Active Member of Clubs or Organizations:   . Attends Archivist Meetings:   Marland Kitchen Marital Status:   Intimate Partner Violence:   . Fear of Current or Ex-Partner:   . Emotionally Abused:   Marland Kitchen Physically Abused:   . Sexually Abused:     Outpatient Medications Prior to Visit  Medication Sig Dispense Refill  . ALPRAZolam (XANAX) 0.5 MG tablet TAKE ONE TABLET BY MOUTH DAILY AS NEEDED FOR ANXIETY 30 tablet 0  . aspirin 81 MG tablet Take 81 mg by mouth daily.      Marland Kitchen atorvastatin (LIPITOR) 40 MG tablet TAKE ONE TABLET BY MOUTH EVERY NIGHT AT BEDTIME 90 tablet 2  . B-D UF III MINI PEN NEEDLES 31G X 5 MM MISC USE ONCE DAILY 100 each 10  . glucose blood (ONE TOUCH TEST STRIPS) test strip Pt testing one time a day. 100 each 3  . Insulin Glargine (BASAGLAR KWIKPEN)  100 UNIT/ML INJECT 38 UNITS TOTAL INTO THE SKIN EVERY NIGHT AT BEDTIME 12 pen 2  . JARDIANCE 10 MG TABS tablet TAKE ONE TABLET BY MOUTH DAILY 30 tablet 2  . lisinopril (ZESTRIL) 20 MG tablet Take 1 tablet (20 mg total) by mouth daily. 90 tablet 1  . metFORMIN (GLUCOPHAGE) 1000 MG tablet TAKE ONE TABLET BY MOUTH TWICE A DAY WITH FOOD 180 tablet 1  . Misc. Devices MISC New CPAP machine with mask and supplies through [facility].  CPAP therapy at 14 cm. water pressure.    . ONE TOUCH ULTRA TEST test strip TEST as directed 100 each 3  . PARoxetine (PAXIL) 20 MG tablet Take 1 tablet (20 mg total) by mouth daily. 30 tablet 0  . tamsulosin (FLOMAX) 0.4 MG CAPS capsule Take 1 capsule (0.4 mg total) by mouth daily after supper. 30 capsule 5  . traZODone (DESYREL) 50 MG tablet Take 1 tablet (50 mg total) by mouth at bedtime. 30 tablet 0  . venlafaxine (EFFEXOR) 37.5 MG tablet TAKE ONE TABLET BY MOUTH DAILY 30 tablet 0   No facility-administered medications prior to visit.    No Known Allergies  Review of Systems     Objective:    Physical Exam Vitals reviewed.  Constitutional:      Appearance: He is well-developed.  HENT:     Head: Normocephalic and atraumatic.  Eyes:     Conjunctiva/sclera: Conjunctivae normal.  Cardiovascular:     Rate and Rhythm: Normal rate.  Pulmonary:     Effort: Pulmonary effort is normal.  Musculoskeletal:     Comments: Left shoulder with decreased extension about 120 degrees.  He had pain with external and internal rotation.  He had discomfort with Neer's test.  He had decreased internal rotation when trying to reach his lower back.  No sign of frozen shoulder.  Nontender over the joint itself.  Skin:    General: Skin is dry.     Coloration: Skin is not pale.  Neurological:     Mental Status: He is alert and oriented to person, place, and time.     Comments: Negative Tinel's and Phalen sign.  Psychiatric:        Behavior: Behavior normal.     BP  (!) 108/52   Pulse 85   Ht 5\' 6"  (1.676 m)   Wt 175 lb (79.4 kg)   SpO2 97%   BMI 28.25 kg/m  Wt Readings from Last 3 Encounters:  02/04/20 175 lb (79.4 kg)  01/29/20 175 lb 0.6 oz (79.4 kg)  01/15/20 180 lb (81.6 kg)    There are no preventive care reminders to display for this patient.  There are no preventive care reminders to display for this patient.   Lab Results  Component Value Date   TSH 1.580 01/25/2015   Lab Results  Component Value Date   WBC 15.2 (H) 07/10/2019   HGB 16.5 07/10/2019   HCT 49.5 07/10/2019   MCV 93.9 07/10/2019   PLT 333 07/10/2019   Lab Results  Component Value Date   NA 138 08/25/2019   K 5.0 08/25/2019   CO2 22 08/25/2019   GLUCOSE 172 (H) 08/25/2019   BUN 12 08/25/2019   CREATININE 0.85 08/25/2019   BILITOT 0.5 08/25/2019   ALKPHOS 61 06/06/2017   AST 12 08/25/2019   ALT 6 (L) 08/25/2019   PROT 6.4 08/25/2019   ALBUMIN 4.1 06/06/2017   CALCIUM 9.3 08/25/2019   Lab Results  Component Value Date   CHOL 73 08/25/2019   Lab Results  Component Value Date   HDL 33 (L) 08/25/2019   Lab Results  Component Value Date   LDLCALC 17 08/25/2019   Lab Results  Component Value Date   TRIG 145 08/25/2019   Lab Results  Component Value Date   CHOLHDL 2.2 08/25/2019   Lab Results  Component Value Date   HGBA1C 6.2 (A) 11/24/2019       Assessment & Plan:   Problem List Items Addressed This Visit    None    Visit Diagnoses    Acute pain of left shoulder    -  Primary   Relevant Orders   Ambulatory referral to Physical Therapy   Left hand paresthesia         Left shoulder pain  -suspect bursitis based on exam today.  Discussed formal physical therapy.  Did not do any x-rays today since he did not have any specific injury or trauma.  He has pain pretty much with full extension and internal and external rotation.  But I put him on prednisone more so for the paresthesias to see if it helps and if it temporarily eliminates his  symptoms but also see if it may help with his shoulder pain.  Recommend a trial of icing as well.  Also consider oral NSAID if the prednisone is helpful.  Left hand paresthesia-negative Phalen's and Tinel sign today.  But it definitely seems somewhat positional when his hand rests on his chest with sleep.  So most likely coming from a pinched nerve.  Either the median nerve or from higher up in the elbow, shoulder or neck.  Meds ordered this encounter  Medications  . predniSONE (DELTASONE) 20 MG tablet    Sig: Take 2 tablets (40 mg total) by mouth daily with breakfast.    Dispense:  10 tablet    Refill:  0     01/22/2020, MD

## 2020-02-04 NOTE — Patient Instructions (Signed)
Shoulder Pain Many things can cause shoulder pain, including:  An injury.  Moving the shoulder in the same way again and again (overuse).  Joint pain (arthritis). Pain can come from:  Swelling and irritation (inflammation) of any part of the shoulder.  An injury to the shoulder joint.  An injury to: ? Tissues that connect muscle to bone (tendons). ? Tissues that connect bones to each other (ligaments). ? Bones. Follow these instructions at home: Watch for changes in your symptoms. Let your doctor know about them. Follow these instructions to help with your pain. If you have a sling:  Wear the sling as told by your doctor. Remove it only as told by your doctor.  Loosen the sling if your fingers: ? Tingle. ? Become numb. ? Turn cold and blue.  Keep the sling clean.  If the sling is not waterproof: ? Do not let it get wet. ? Take the sling off when you shower or bathe. Managing pain, stiffness, and swelling   If told, put ice on the painful area: ? Put ice in a plastic bag. ? Place a towel between your skin and the bag. ? Leave the ice on for 20 minutes, 2-3 times a day. Stop putting ice on if it does not help with the pain.  Squeeze a soft ball or a foam pad as much as possible. This prevents swelling in the shoulder. It also helps to strengthen the arm. General instructions  Take over-the-counter and prescription medicines only as told by your doctor.  Keep all follow-up visits as told by your doctor. This is important. Contact a doctor if:  Your pain gets worse.  Medicine does not help your pain.  You have new pain in your arm, hand, or fingers. Get help right away if:  Your arm, hand, or fingers: ? Tingle. ? Are numb. ? Are swollen. ? Are painful. ? Turn white or blue. Summary  Shoulder pain can be caused by many things. These include injury, moving the shoulder in the same away again and again, and joint pain.  Watch for changes in your symptoms.  Let your doctor know about them.  This condition may be treated with a sling, ice, and pain medicine.  Contact your doctor if the pain gets worse or you have new pain. Get help right away if your arm, hand, or fingers tingle or get numb, swollen, or painful.  Keep all follow-up visits as told by your doctor. This is important. This information is not intended to replace advice given to you by your health care provider. Make sure you discuss any questions you have with your health care provider. Document Revised: 05/13/2018 Document Reviewed: 05/13/2018 Elsevier Patient Education  2020 Elsevier Inc.  

## 2020-02-04 NOTE — Progress Notes (Signed)
[facility] Outpatient Follow up visit  Telepsych Patient Identification: ABEER IVERSEN MRN:  [id number] Date of Evaluation:  02/04/2020 Referral Source: primary care.  Chief Complaint:    depression follow up  Visit Diagnosis:    ICD-10-CM   1. Major depressive disorder, recurrent episode, moderate (HCC)  F33.1   2. GAD (generalized anxiety disorder)  F41.1   3. Panic disorder  F41.0   4. Nicotine dependence with other nicotine-induced disorder, unspecified nicotine product type  F17.208     History of Present Illness:  69  years old single white male. Referred initially for management of depression    I connected with [name] on 02/04/20 at  1:15 PM EDT by telephone and verified that I am speaking with the correct person using two identifiers.   I discussed the limitations, risks, security and privacy concerns of performing an evaluation and management service by telephone and the availability of in person appointments. I also discussed with the patient that there may be a patient responsible charge related to this service. The patient expressed understanding and agreed to proceed.   Doing baseline, gets lonely that can lead to depression paxil helps depression  Denies using alcohol or drugs Panic attacks sporadic around people. Has to take xanax for anxiety   Aggravating factor:: lonliness ,modifying factor: dog Some friends.  Timing in morning   Past Psychiatric History: depression ,anxiety  Previous Psychotropic Medications: Yes   Substance Abuse History in the last 12 months:  No.  Consequences of Substance Abuse: NA  Past Medical History:  Past Medical History:  Diagnosis Date  . Bell's palsy 12-10  . Depression   . Diabetes mellitus    type 2  . Hyperlipidemia   . Hypertension     Past Surgical History:  Procedure Laterality Date  . CATARACT EXTRACTION Left   . kidney stone removed    . pfts     normal    Family Psychiatric History: anxiety.  Mom used valium  Family History:  Family History  Problem Relation Age of Onset  . COPD Mother   . Hypertension Mother   . Alzheimer's disease Mother        ?  . Diabetes Father   . COPD Father   . Drug abuse Son   . COPD Sister   . Cancer Sister        lung    Social History:   Social History   Socioeconomic History  . Marital status: Single    Spouse name: Not on file  . Number of children: Not on file  . Years of education: Not on file  . Highest education level: Not on file  Occupational History  . Not on file  Tobacco Use  . Smoking status: Current Every Day Smoker    Packs/day: 1.00    Years: 25.00    Pack years: 25.00    Types: Cigarettes    Last attempt to quit: 02/10/2013    Years since quitting: 6.9  . Smokeless tobacco: Never Used  . Tobacco comment: using electronic cigarettes  Substance and Sexual Activity  . Alcohol use: No    Alcohol/week: 0.0 standard drinks  . Drug use: No  . Sexual activity: Never    Partners: Female  Other Topics Concern  . Not on file  Social History Narrative  . Not on file   Social Determinants of Health   Financial Resource Strain:   . Difficulty of Paying Living Expenses:  Food Insecurity:   . Worried About Programme researcher, broadcasting/film/video in the Last Year:   . Barista in the Last Year:   Transportation Needs:   . Freight forwarder (Medical):   Marland Kitchen Lack of Transportation (Non-Medical):   Physical Activity:   . Days of Exercise per Week:   . Minutes of Exercise per Session:   Stress:   . Feeling of Stress :   Social Connections:   . Frequency of Communication with Friends and Family:   . Frequency of Social Gatherings with Friends and Family:   . Attends Religious Services:   . Active Member of Clubs or Organizations:   . Attends Banker Meetings:   Marland Kitchen Marital Status:       Allergies:  No Known Allergies  Metabolic Disorder Labs: Lab Results  Component Value Date   HGBA1C 6.2 (A)  11/24/2019   No results found for: PROLACTIN Lab Results  Component Value Date   CHOL 73 08/25/2019   TRIG 145 08/25/2019   HDL 33 (L) 08/25/2019   CHOLHDL 2.2 08/25/2019   VLDL 22 01/17/2016   LDLCALC 17 08/25/2019   LDLCALC 90 05/16/2018     Current Medications: Current Outpatient Medications  Medication Sig Dispense Refill  . ALPRAZolam (XANAX) 0.5 MG tablet TAKE ONE TABLET BY MOUTH DAILY AS NEEDED FOR ANXIETY 30 tablet 0  . aspirin 81 MG tablet Take 81 mg by mouth daily.      Marland Kitchen atorvastatin (LIPITOR) 40 MG tablet TAKE ONE TABLET BY MOUTH EVERY NIGHT AT BEDTIME 90 tablet 2  . B-D UF III MINI PEN NEEDLES 31G X 5 MM MISC USE ONCE DAILY 100 each 10  . glucose blood (ONE TOUCH TEST STRIPS) test strip Pt testing one time a day. 100 each 3  . Insulin Glargine (BASAGLAR KWIKPEN) 100 UNIT/ML INJECT 38 UNITS TOTAL INTO THE SKIN EVERY NIGHT AT BEDTIME 12 pen 2  . JARDIANCE 10 MG TABS tablet TAKE ONE TABLET BY MOUTH DAILY 30 tablet 2  . lisinopril (ZESTRIL) 20 MG tablet Take 1 tablet (20 mg total) by mouth daily. 90 tablet 1  . metFORMIN (GLUCOPHAGE) 1000 MG tablet TAKE ONE TABLET BY MOUTH TWICE A DAY WITH FOOD 180 tablet 1  . Misc. Devices MISC New CPAP machine with mask and supplies through [facility].  CPAP therapy at 14 cm. water pressure.    . ONE TOUCH ULTRA TEST test strip TEST as directed 100 each 3  . PARoxetine (PAXIL) 20 MG tablet Take 1 tablet (20 mg total) by mouth daily. 30 tablet 0  . tamsulosin (FLOMAX) 0.4 MG CAPS capsule Take 1 capsule (0.4 mg total) by mouth daily after supper. 30 capsule 5  . traZODone (DESYREL) 50 MG tablet Take 1 tablet (50 mg total) by mouth at bedtime. 30 tablet 0  . venlafaxine (EFFEXOR) 37.5 MG tablet TAKE ONE TABLET BY MOUTH DAILY 30 tablet 0   No current facility-administered medications for this visit.      Psychiatric Specialty Exam: Review of Systems  Cardiovascular: Negative for chest pain.  Skin: Negative for rash.   Psychiatric/Behavioral: Negative for suicidal ideas.    There were no vitals taken for this visit.There is no height or weight on file to calculate BMI.  General Appearance:   Eye Contact:    Speech:  Slow  Volume:  Decreased  Mood: fair  Affect:  congruent  Thought Process:  Goal Directed  Orientation:  Full (Time, Place,  and Person)  Thought Content:  Rumination  Suicidal Thoughts:  No  Homicidal Thoughts:  No  Memory:  Immediate;   Fair Recent;   Fair  Judgement:  Poor  Insight:  Shallow  Psychomotor Activity:  Decreased  Concentration:  Concentration: Fair and Attention Span: Fair  Recall:  [employer] of Knowledge:Fair  Language: Fair  Akathisia:  Negative  Handed:  Right  AIMS (if indicated):  0  Assets:  Desire for Improvement  ADL's:  Intact  Cognition: WNL  Sleep:  fair    Treatment Plan Summary: Medication management and Plan as follows   1. Major depression moderate recurrent: fair, work on Database administrator, paxil. No side effects 3. Panic attacks:sporadic, on xanax, avoid nicotine 4. Nicotine dependence: now taking e cig. Working on cutting down  I discussed the assessment and treatment plan with the patient. The patient was provided an opportunity to ask questions and all were answered. The patient agreed with the plan and demonstrated an understanding of the instructions.   The patient was advised to call back or seek an in-person evaluation if the symptoms worsen or if the condition fails to improve as anticipated.  I provided 15 minutes of non-face-to-face time during this encounter. Fu 2-3 m. Call for refills. paxil sent  Thresa Ross, MD 3/25/20211:21 PM

## 2020-02-10 ENCOUNTER — Telehealth (HOSPITAL_COMMUNITY): Payer: Self-pay

## 2020-02-10 MED ORDER — ALPRAZOLAM 0.5 MG PO TABS: ORAL_TABLET | ORAL | 0 refills | Status: DC

## 2020-02-10 NOTE — Telephone Encounter (Signed)
sent 

## 2020-02-10 NOTE — Telephone Encounter (Signed)
Patient needs refill on Xanax. Karin Golden in [place]

## 2020-02-18 ENCOUNTER — Other Ambulatory Visit: Payer: Self-pay

## 2020-02-18 ENCOUNTER — Ambulatory Visit: Payer: Medicare HMO | Admitting: Rehabilitative and Restorative Service Providers"

## 2020-02-18 ENCOUNTER — Encounter: Payer: Self-pay | Admitting: Rehabilitative and Restorative Service Providers"

## 2020-02-18 DIAGNOSIS — M25512 Pain in left shoulder: Secondary | ICD-10-CM

## 2020-02-18 DIAGNOSIS — R293 Abnormal posture: Secondary | ICD-10-CM | POA: Diagnosis not present

## 2020-02-18 DIAGNOSIS — R29898 Other symptoms and signs involving the musculoskeletal system: Secondary | ICD-10-CM | POA: Diagnosis not present

## 2020-02-18 NOTE — Patient Instructions (Signed)
Access Code: [email or web address]: [email or web address]: 04/08/2021Prepared by: Tranise Forrest HoltExercises  Standing Scapular Retraction - 2 x daily - 7 x weekly - 10 reps - 1 sets  Standing Shoulder Internal Rotation Stretch with Towel - 2 x daily - 7 x weekly - 1 sets - 3 reps - 20 seconds hold  Seated Shoulder Flexion AAROM with Pulley Behind - 2 x daily - 7 x weekly - 10 reps - 1 sets  Seated Shoulder Scaption AAROM with Pulley at Side - 2 x daily - 7 x weekly - 10 reps - 1 sets

## 2020-02-18 NOTE — Therapy (Signed)
[facility] [place] [address] [place], [place], [address] Phone: [phone]   Fax:  [phone]  Physical Therapy Evaluation  Patient Details  Name: Gabriel Gilbert MRN: [id number] Date of Birth: July 12, 1951 Referring Provider (PT): Dr [name]   Encounter Date: 02/18/2020  PT End of Session - 02/18/20 1444    Visit Number  1    Number of Visits  12    Date for PT Re-Evaluation  05/01/20    PT Start Time  1401    PT Stop Time  1448    PT Time Calculation (min)  47 min    Activity Tolerance  Patient tolerated treatment well       Past Medical History:  Diagnosis Date  . Bell's palsy 12-10  . Depression   . Diabetes mellitus    type 2  . Hyperlipidemia   . Hypertension     Past Surgical History:  Procedure Laterality Date  . CATARACT EXTRACTION Left   . kidney stone removed    . pfts     normal    There were no vitals filed for this visit.   Subjective Assessment - 02/18/20 1407    Subjective  Patient reports that he has Lt shoulder pain when sleeping - pain awakens him when he is sleeping and it is hard for him to sleep on the Lt side. Lt shoulder has been painful for the past 3 or more weeks. Prednisone may have helped some but he ran out    Pertinent History  HTN; anxiety; depression; arthritic changes; knee pain    Patient Stated Goals  get rid of the shoulder pain and sleep on the Lt side    Currently in Pain?  No/denies    Pain Score  0-No pain    Pain Location  Shoulder    Pain Orientation  Left    Pain Descriptors / Indicators  Aching    Pain Type  Acute pain    Pain Radiating Towards  mid arm    Pain Onset  More than a month ago    Pain Frequency  Intermittent    Aggravating Factors   lying on the Lt side - numbness in Lt UE when sleeping on back    Pain Relieving Factors  moving; changing positions of arm         [facility] PT Assessment - 02/18/20 0001      Assessment   Medical Diagnosis  Lt  shoulder dysfunction    Referring Provider (PT)  Dr [name]    Onset Date/Surgical Date  01/11/20    Hand Dominance  Left    Next MD Visit  03/24/20    Prior Therapy  x3 for knee pain       Precautions   Precautions  None      Restrictions   Weight Bearing Restrictions  No      Balance Screen   Has the patient fallen in the past 6 months  No    Has the patient had a decrease in activity level because of a fear of falling?   No    Is the patient reluctant to leave their home because of a fear of falling?   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     Leisure  household chores; yard work; shopping/walking; walking dog daily ~ 10 min       Observation/Other Assessments   Focus on Therapeutic Outcomes (FOTO)   25% limitation       Sensation   Additional Comments  numbness in the Lt UE when lying on back at night       Posture/Postural Control   Posture Comments  head forward; shds rounded and elevated; head of the humerus anterior in orientation; increased thoracic kyphosis       AROM   Right/Left Shoulder  --   pain with Lt shd flexion; abdcution and IR    Right Shoulder Extension  70 Degrees    Right Shoulder Flexion  127 Degrees    Right Shoulder ABduction  133 Degrees    Right Shoulder Internal Rotation  --   thumb to T9   Right Shoulder External Rotation  86 Degrees    Left Shoulder Extension  51 Degrees    Left Shoulder Flexion  119 Degrees    Left Shoulder ABduction  117 Degrees    Left Shoulder Internal Rotation  --   hand to sacrum    Left Shoulder External Rotation  80 Degrees    Cervical Flexion  50    Cervical Extension  39    Cervical - Right Side Bend  38    Cervical - Left Side Bend  40    Cervical - Right Rotation  60    Cervical - Left Rotation  62       Strength   Overall Strength Comments  WFL's bilat UE's       Palpation   Spinal mobility  hypomobile thoracic spine with PA mobs    Palpation comment  muscular tightness Lt pecs; upper trap; leveator; deltoid                 Objective measurements completed on examination: See above findings.      [facility] PT Treatment/Exercise - 02/18/20 0001      Shoulder Exercises: Standing   Other Standing Exercises  scap squeeze 10 sec x 10 with noodle       Shoulder Exercises: Pulleys   Flexion  --   10 sec hold x 5   Scaption  --   10 sec hold x 5      Moist Heat Therapy   Number Minutes Moist Heat  10 Minutes    Moist Heat Location  Shoulder   Lt             PT Education - 02/18/20 1442    Education Details  HEP; pulley for home; POC    Person(s) Educated  Patient    Methods  Explanation;Demonstration;Tactile cues;Verbal cues;Handout    Comprehension  Verbalized understanding;Returned demonstration;Verbal cues required;Tactile cues required          PT Long Term Goals - 02/18/20 1450      PT LONG TERM GOAL #1   Title  Increase AROM Lt shoulder to equal or greater that AROM Rt shoulder    Time  6    Period  Weeks    Status  New    Target Date  03/31/20      PT LONG TERM GOAL #2   Title  Patient reports ability to sleep on Lt side for 2-4 hours at night without awakening due to pain    Time  6    Period  Weeks    Status  New      PT LONG TERM GOAL #3   Title  Improve cervical mobility and ROM in lateral flexion Rt and Lt with no tightness or pain    Time  6    Period  Weeks    Status  New    Target Date  03/31/20      PT LONG TERM GOAL #4   Title  Independent in HEP    Time  6    Period  Weeks    Status  New    Target Date  03/31/20      PT LONG TERM GOAL #5   Title  Maintain FOTO at no greater than 25% limitation    Time  6    Period  Weeks    Status  New    Target Date  03/31/20             Plan - 02/18/20 1445     Clinical Impression Statement  Patient presents with signs and symptoms consistent with early adhesive capsulitis. He has pain with elevation and internal rotation and pain a night. There is no known injury. He is a Lt side sleeper. Patient has poor posture and alignment; limited Lt > Rt shoulder and cervical ROM; pain with AROM; decreased functional activity level; pain with sleep. Patient will benefit from PT to address problems identified.    Stability/Clinical Decision Making  Stable/Uncomplicated    Clinical Decision Making  Low    Rehab Potential  Good    PT Frequency  2x / week    PT Duration  6 weeks    PT Treatment/Interventions  Patient/family education;ADLs/Self Care Home Management;Cryotherapy;Electrical Stimulation;Iontophoresis 4mg /ml Dexamethasone;Moist Heat;Ultrasound;Therapeutic activities;Therapeutic exercise;Neuromuscular re-education;Manual techniques;Passive range of motion;Dry needling;Taping;Vasopneumatic Device    PT Next Visit Plan  fiurther assessment of cervical dysfunction as indicated; review HEP; progress with ROM Lt shoulder; postural strengthening; manual work; PROM/stretching Lt shoulder; modalities as indicated    PT Home Exercise Plan  [email or web address]    Consulted and Agree with Plan of Care  Patient       Patient will benefit from skilled therapeutic intervention in order to improve the following deficits and impairments:  Increased fascial restricitons, Impaired UE functional use, Pain, Decreased activity tolerance, Hypermobility, Decreased mobility, Decreased strength, Improper body mechanics, Impaired sensation  Visit Diagnosis: Acute pain of left shoulder - Plan: PT plan of care cert/re-cert  Abnormal posture - Plan: PT plan of care cert/re-cert  Other symptoms and signs involving the musculoskeletal system - Plan: PT plan of care cert/re-cert     Problem List Patient Active Problem List   Diagnosis Date Noted  . Pulmonary nodule, left 04/24/2019  .  Urinary hesitancy 05/16/2018  . Nicotine dependence, uncomplicated 01/01/2016  . Hydronephrosis with urinary obstruction due to ureteral calculus 01/01/2016  . Benign non-nodular prostatic hyperplasia with lower urinary tract symptoms 01/01/2016  . Left ureteral stone 01/01/2016  . Left cervical radiculopathy 04/28/2015  . Chondromalacia of right patellofemoral joint 04/28/2015  . OSA on CPAP 06/26/2013  . Major depressive disorder, recurrent episode, severe, without mention of psychotic behavior 04/21/2013  . Generalized anxiety disorder 04/21/2013  . Obesity 02/14/2012  . RLS (restless legs syndrome) 03/12/2011  . DERMATITIS, HANDS 08/04/2010  . BELL'S PALSY 10/26/2009  . CORTICAL SENILE CATARACT 05/09/2009  . LUMBAR RADICULOPATHY 11/26/2008  . Type 2 diabetes mellitus with periodontal complication (HCC) 03/13/2007  . INCONTINENCE, URGE 03/13/2007  .  DEPRESSION, MAJOR, RECURRENT 08/20/2006  . TOBACCO DEPENDENCE 08/20/2006  . Essential hypertension, benign 08/20/2006  . KIDNEY STONE - NEPHROLITHIASIS 08/20/2006  . SLEEP APNEA 08/20/2006    Korde Jeppsen Rober Minion PT, MPH  02/18/2020, 4:02 PM  [facility] [address] [place], [place], [address] Phone: [phone]   Fax:  [phone]  Name: Gabriel Gilbert MRN: [id number] Date of Birth: Mar 24, 1951

## 2020-02-24 ENCOUNTER — Telehealth: Payer: Self-pay | Admitting: Physical Therapy

## 2020-02-24 ENCOUNTER — Encounter: Payer: Medicare HMO | Admitting: Physical Therapy

## 2020-02-24 NOTE — Telephone Encounter (Signed)
Patient did not show for PT appt.  Called patient regarding missed appt and spoke to him.  He stated that he didn't know about appt, as he did not receive a reminder text.  He did verify he knew about next upcoming appt on Friday; confirmed.   Mayer Camel, PTA 02/24/20 2:23 PM

## 2020-02-26 ENCOUNTER — Ambulatory Visit (INDEPENDENT_AMBULATORY_CARE_PROVIDER_SITE_OTHER): Payer: Medicare HMO | Admitting: Physical Therapy

## 2020-02-26 ENCOUNTER — Other Ambulatory Visit: Payer: Self-pay

## 2020-02-26 ENCOUNTER — Encounter: Payer: Self-pay | Admitting: Physical Therapy

## 2020-02-26 DIAGNOSIS — R293 Abnormal posture: Secondary | ICD-10-CM

## 2020-02-26 DIAGNOSIS — M25512 Pain in left shoulder: Secondary | ICD-10-CM | POA: Diagnosis not present

## 2020-02-26 DIAGNOSIS — R29898 Other symptoms and signs involving the musculoskeletal system: Secondary | ICD-10-CM | POA: Diagnosis not present

## 2020-02-26 NOTE — Therapy (Signed)
[facility] [address] [place], [place], [address] Phone: [phone]   Fax:  [phone]  Physical Therapy Treatment  Patient Details  Name: Gabriel Gilbert MRN: [id number] Date of Birth: [birth year] Referring Provider (PT): Dr [name]   Encounter Date: 02/26/2020  PT End of Session - 02/26/20 1447    Visit Number  2    Number of Visits  12    Date for PT Re-Evaluation  05/01/20    PT Start Time  1401    PT Stop Time  1440    PT Time Calculation (min)  39 min    Activity Tolerance  Patient tolerated treatment well    Behavior During Therapy  [facility] for tasks assessed/performed       Past Medical History:  Diagnosis Date  . Bell's palsy 12-10  . Depression   . Diabetes mellitus    type 2  . Hyperlipidemia   . Hypertension     Past Surgical History:  Procedure Laterality Date  . CATARACT EXTRACTION Left   . kidney stone removed    . pfts     normal    There were no vitals filed for this visit.  Subjective Assessment - 02/26/20 1404    Subjective  "I'm doing fair".  Pt reports he has not done his HEP (lacking motivation). He is sleeping on his back since can't sleep on Lt side.  He continues to have pain with reaching out (abdct) up to 5/10.    Patient Stated Goals  get rid of the shoulder pain and sleep on the Lt side    Currently in Pain?  No/denies    Pain Score  0-No pain         [facility] PT Assessment - 02/26/20 0001      Assessment   Medical Diagnosis  Lt shoulder dysfunction    Referring Provider (PT)  Dr [name]    Onset Date/Surgical Date  01/11/20    Hand Dominance  Left    Next MD Visit  03/24/20    Prior Therapy  x3 for knee pain       AROM   Left Shoulder Extension  56 Degrees    Left Shoulder Flexion  128 Degrees    Left Shoulder ABduction  125 Degrees    Left Shoulder Internal Rotation  --   hand to sacrum    Left Shoulder External Rotation  82 Degrees   standing, arm abdct 90  deg     [facility] Adult PT Treatment/Exercise - 02/26/20 0001      Shoulder Exercises: Supine   Horizontal ABduction  AAROM;Both;10 reps   with cane   External Rotation  AAROM;Left;10 reps   with cane     Shoulder Exercises: Seated   Other Seated Exercises  seated scap retraction x 5 sec x 5 reps (cues to continue exercises)      Shoulder Exercises: Standing   Extension  AAROM;Both;10 reps   cane behind back      Shoulder Exercises: Pulleys   Scaption  3 minutes      Shoulder Exercises: Stretch   Internal Rotation Stretch  5 reps   AAROM, using opp hand behind back.    Other Shoulder Stretches  midlevel doorway stretch x 15 sec x 3 reps.      Moderate cues for pacing and to continue reps.     PT Long Term Goals - 02/18/20 1450      PT LONG TERM  GOAL #1   Title  Increase AROM Lt shoulder to equal or greater that AROM Rt shoulder    Time  6    Period  Weeks    Status  New    Target Date  03/31/20      PT LONG TERM GOAL #2   Title  Patient reports ability to sleep on Lt side for 2-4 hours at night without awakening due to pain    Time  6    Period  Weeks    Status  New      PT LONG TERM GOAL #3   Title  Improve cervical mobility and ROM in lateral flexion Rt and Lt with no tightness or pain    Time  6    Period  Weeks    Status  New    Target Date  03/31/20      PT LONG TERM GOAL #4   Title  Independent in HEP    Time  6    Period  Weeks    Status  New    Target Date  03/31/20      PT LONG TERM GOAL #5   Title  Maintain FOTO at no greater than 25% limitation    Time  6    Period  Weeks    Status  New    Target Date  03/31/20       Pt issued updated HEP.  Pt verbalized understanding.    Plan - 02/26/20 1447    Clinical Impression Statement  Pt demonstrated improved Lt shoulder ROM, despite report of not completing HEP outside of therapy session.  Pt tolerated AAROM exercises with some discomfort at end range, encouraged to keep range to tolerance.   Adjusted HEP.  Goals are ongoing.    Stability/Clinical Decision Making  Stable/Uncomplicated    Rehab Potential  Good    PT Frequency  2x / week    PT Duration  6 weeks    PT Treatment/Interventions  Patient/family education;ADLs/Self Care Home Management;Cryotherapy;Electrical Stimulation;Iontophoresis 4mg /ml Dexamethasone;Moist Heat;Ultrasound;Therapeutic activities;Therapeutic exercise;Neuromuscular re-education;Manual techniques;Passive range of motion;Dry needling;Taping;Vasopneumatic Device    PT Next Visit Plan  fiurther assessment of cervical dysfunction as indicated; review HEP; progress with ROM Lt shoulder; postural strengthening; manual work; PROM/stretching Lt shoulder; modalities as indicated    PT Home Exercise Plan  [email or web address]    Consulted and Agree with Plan of Care  Patient       Patient will benefit from skilled therapeutic intervention in order to improve the following deficits and impairments:  Increased fascial restricitons, Impaired UE functional use, Pain, Decreased activity tolerance, Hypermobility, Decreased mobility, Decreased strength, Improper body mechanics, Impaired sensation  Visit Diagnosis: Acute pain of left shoulder  Abnormal posture  Other symptoms and signs involving the musculoskeletal system     Problem List Patient Active Problem List   Diagnosis Date Noted  . Pulmonary nodule, left 04/24/2019  . Urinary hesitancy 05/16/2018  . Nicotine dependence, uncomplicated 08/65/7846  . Hydronephrosis with urinary obstruction due to ureteral calculus 01/01/2016  . Benign non-nodular prostatic hyperplasia with lower urinary tract symptoms 01/01/2016  . Left ureteral stone 01/01/2016  . Left cervical radiculopathy 04/28/2015  . Chondromalacia of right patellofemoral joint 04/28/2015  . OSA on CPAP 06/26/2013  . Major depressive disorder, recurrent episode, severe, without mention of psychotic behavior 04/21/2013  . Generalized anxiety disorder  04/21/2013  . Obesity 02/14/2012  . RLS (restless legs syndrome) 03/12/2011  . DERMATITIS, HANDS 08/04/2010  . BELL'S  PALSY 10/26/2009  . CORTICAL SENILE CATARACT 05/09/2009  . LUMBAR RADICULOPATHY 11/26/2008  . Type 2 diabetes mellitus with periodontal complication (HCC) 03/13/2007  . INCONTINENCE, URGE 03/13/2007  . DEPRESSION, MAJOR, RECURRENT 08/20/2006  . TOBACCO DEPENDENCE 08/20/2006  . Essential hypertension, benign 08/20/2006  . KIDNEY STONE - NEPHROLITHIASIS 08/20/2006  . SLEEP APNEA 08/20/2006   Mayer Camel, PTA 02/26/20 4:29 PM  [facility] [address] [place], [place], [address] Phone: [phone]   Fax:  [phone]  Name: Gabriel Gilbert MRN: [id number] Date of Birth: 1950-12-10

## 2020-03-03 ENCOUNTER — Telehealth: Payer: Self-pay

## 2020-03-03 ENCOUNTER — Ambulatory Visit: Payer: Medicare HMO | Admitting: Family Medicine

## 2020-03-03 ENCOUNTER — Encounter: Payer: Self-pay | Admitting: Rehabilitative and Restorative Service Providers"

## 2020-03-03 ENCOUNTER — Other Ambulatory Visit: Payer: Self-pay

## 2020-03-03 ENCOUNTER — Ambulatory Visit (INDEPENDENT_AMBULATORY_CARE_PROVIDER_SITE_OTHER): Payer: Medicare HMO | Admitting: Rehabilitative and Restorative Service Providers"

## 2020-03-03 DIAGNOSIS — R29898 Other symptoms and signs involving the musculoskeletal system: Secondary | ICD-10-CM

## 2020-03-03 DIAGNOSIS — R293 Abnormal posture: Secondary | ICD-10-CM | POA: Diagnosis not present

## 2020-03-03 DIAGNOSIS — M25512 Pain in left shoulder: Secondary | ICD-10-CM | POA: Diagnosis not present

## 2020-03-03 NOTE — Patient Instructions (Addendum)
Scapula Adduction With Pectorals, Low   Stand in doorframe with palms against frame and arms at 45. Lean forward and squeeze shoulder blades. Hold _30__ seconds. Repeat _3__ times per session. Do _2__ sessions per day.    Scapula Adduction With Pectorals, Mid-Range   Stand in doorframe with palms against frame and arms at 90. Lean forward and squeeze shoulder blades. Hold __30_ seconds. Repeat _3__ times per session. Do _2__ sessions per day.  \Scapula Adduction With Pectorals, High   Stand in doorframe with palms against frame and arms at 120. Lean forward and squeeze shoulder blades. Hold _30__ seconds. Repeat _3__ times per session. Do _2__ sessions per day. NO PAIN       Access Code: [email or web address]: [email or web address]: 04/22/2021Prepared by: Karletta Millay HoltExercises  Supine Shoulder Flexion Extension AAROM with Dowel - 2 x daily - 7 x weekly - 1 sets - 10 reps - 5 hold  Supine Chest Stretch with Elbows Bent - 2 x daily - 7 x weekly - 1 sets - 2-3 reps - 15- seconds hold  Standing Scapular Retraction - 2 x daily - 7 x weekly - 5 reps - 1 sets - 5 hold  Standing Shoulder Internal Rotation Stretch with Hands Behind Back - 2 x daily - 7 x weekly - 11 sets - 10 reps - 5 hold  Doorway Pec Stretch at 90 Degrees Abduction - 2 x daily - 7 x weekly - 1 sets - 2-3 reps - 15 sec hold  Standing Shoulder Extension with Dowel - 1 x daily - 7 x weekly - 2-3 sets - 10 reps  Standing Shoulder External Rotation with Resistance - 1 x daily - 7 x weekly - 2-3 sets - 10 reps - 3 sec hold  Standing Row with Anchored Resistance - 1 x daily - 7 x weekly - 2-3 sets - 10 reps - 3 sec hold  Shoulder Extension with Resistance - 1 x daily - 7 x weekly - 2-3 sets - 10 reps - 3 sec hold  Seated Cervical Retraction - 4-5 x daily - 7 x weekly - 1 sets - 5-10 reps - 5-10 sec hold  Seated Cervical Sidebending AROM - 2 x daily - 7 x weekly - 1 sets - 3 reps - 11-15 sec hold

## 2020-03-03 NOTE — Therapy (Signed)
[facility] [place] [address] [place], [place], [address] Phone: [phone]   Fax:  [phone]  Physical Therapy Treatment  Patient Details  Name: Gabriel Gilbert MRN: [id number] Date of Birth: 1951/11/01 Referring Provider (PT): Dr [name]   Encounter Date: 03/03/2020  PT End of Session - 03/03/20 1406    Visit Number  3    Number of Visits  12    Date for PT Re-Evaluation  05/01/20    PT Start Time  1402    PT Stop Time  1445    PT Time Calculation (min)  43 min    Activity Tolerance  Patient tolerated treatment well       Past Medical History:  Diagnosis Date  . Bell's palsy 12-10  . Depression   . Diabetes mellitus    type 2  . Hyperlipidemia   . Hypertension     Past Surgical History:  Procedure Laterality Date  . CATARACT EXTRACTION Left   . kidney stone removed    . pfts     normal    There were no vitals filed for this visit.  Subjective Assessment - 03/03/20 1407    Subjective  Shoulder is better. Less painful. Not doing his exercises. Can't remember to do them.    Currently in Pain?  No/denies                       [facility] Adult PT Treatment/Exercise - 03/03/20 0001      Shoulder Exercises: Seated   Other Seated Exercises  seated scap retraction x 10 sec x 5 reps; axial extension 10 sec x 10; lateral cervical flexion 10 sec x 3 reps repeated with reach to the floor to increase upper trap stretch; posterior shoulder rolls        Shoulder Exercises: Standing   Other Standing Exercises  scap squeeze 10 sec x 10; L's x 10; W's x 10 with noodle       Shoulder Exercises: Pulleys   Scaption  2 minutes   encouraging hold with elevation      Shoulder Exercises: Stretch   Other Shoulder Stretches  doorway lower/middle level positions 30 sec x 3 reps - higher position placing hands on doorway without stepping through the door 30 sec x 3 reps encouraging pt to avoid pain       Manual  Therapy   Manual therapy comments  pt sitting     Joint Mobilization  thoracic PA mobs Grade II    Soft tissue mobilization  working through the anterior chest/shoulders with pt seated     Passive ROM  passive thoracic extension noodle along spine              PT Education - 03/03/20 1438    Education Details  HEP    Person(s) Educated  Patient    Methods  Explanation;Demonstration;Tactile cues;Verbal cues;Handout    Comprehension  Verbalized understanding;Returned demonstration;Verbal cues required;Tactile cues required          PT Long Term Goals - 02/18/20 1450      PT LONG TERM GOAL #1   Title  Increase AROM Lt shoulder to equal or greater that AROM Rt shoulder    Time  6    Period  Weeks    Status  New    Target Date  03/31/20      PT LONG TERM GOAL #2   Title  Patient reports ability to  sleep on Lt side for 2-4 hours at night without awakening due to pain    Time  6    Period  Weeks    Status  New      PT LONG TERM GOAL #3   Title  Improve cervical mobility and ROM in lateral flexion Rt and Lt with no tightness or pain    Time  6    Period  Weeks    Status  New    Target Date  03/31/20      PT LONG TERM GOAL #4   Title  Independent in HEP    Time  6    Period  Weeks    Status  New    Target Date  03/31/20      PT LONG TERM GOAL #5   Title  Maintain FOTO at no greater than 25% limitation    Time  6    Period  Weeks    Status  New    Target Date  03/31/20            Plan - 03/03/20 1413    Clinical Impression Statement  Patient reports improvement in Lt shoulder pain but states that he continues to have some pain with certain activities. He is not consistent with HEP and was encouraged to work on exercises at home. Patient increased exercise in the clinic today without difficulty. Issued TB for home use. .    Rehab Potential  Good    PT Frequency  2x / week    PT Duration  6 weeks    PT Treatment/Interventions  Patient/family  education;ADLs/Self Care Home Management;Cryotherapy;Electrical Stimulation;Iontophoresis 4mg /ml Dexamethasone;Moist Heat;Ultrasound;Therapeutic activities;Therapeutic exercise;Neuromuscular re-education;Manual techniques;Passive range of motion;Dry needling;Taping;Vasopneumatic Device    PT Next Visit Plan  further assessment of cervical dysfunction as indicated; encourage compliance HEP; progress with ROM Lt shoulder; postural strengthening; manual work; PROM/stretching Lt shoulder; modalities as indicated    PT Home Exercise Plan  [place]; [facility]    Consulted and Agree with Plan of Care  Patient       Patient will benefit from skilled therapeutic intervention in order to improve the following deficits and impairments:     Visit Diagnosis: Acute pain of left shoulder  Abnormal posture  Other symptoms and signs involving the musculoskeletal system     Problem List Patient Active Problem List   Diagnosis Date Noted  . Pulmonary nodule, left 04/24/2019  . Urinary hesitancy 05/16/2018  . Nicotine dependence, uncomplicated 16/08/9603  . Hydronephrosis with urinary obstruction due to ureteral calculus 01/01/2016  . Benign non-nodular prostatic hyperplasia with lower urinary tract symptoms 01/01/2016  . Left ureteral stone 01/01/2016  . Left cervical radiculopathy 04/28/2015  . Chondromalacia of right patellofemoral joint 04/28/2015  . OSA on CPAP 06/26/2013  . Major depressive disorder, recurrent episode, severe, without mention of psychotic behavior 04/21/2013  . Generalized anxiety disorder 04/21/2013  . Obesity 02/14/2012  . RLS (restless legs syndrome) 03/12/2011  . DERMATITIS, HANDS 08/04/2010  . BELL'S PALSY 10/26/2009  . CORTICAL SENILE CATARACT 05/09/2009  . LUMBAR RADICULOPATHY 11/26/2008  . Type 2 diabetes mellitus with periodontal complication ([place]) 54/07/8118  . INCONTINENCE, URGE 03/13/2007  . DEPRESSION, MAJOR, RECURRENT 08/20/2006  . TOBACCO DEPENDENCE 08/20/2006   . Essential hypertension, benign 08/20/2006  . KIDNEY STONE - NEPHROLITHIASIS 08/20/2006  . SLEEP APNEA 08/20/2006    Damaria Vachon Nilda Simmer PT, MPH  03/03/2020, 2:40 PM  [facility] [place],  [place], [address] Phone: [phone]   Fax:  [phone]  Name: Gabriel Gilbert MRN: [id number] Date of Birth: 1951-06-08

## 2020-03-03 NOTE — Telephone Encounter (Signed)
Patient has a follow up with PCP tomorrow about sleeping problems.

## 2020-03-03 NOTE — Telephone Encounter (Signed)
Anthoney called and left a message for a return call. I called him back and left a message for a return call.

## 2020-03-04 ENCOUNTER — Encounter: Payer: Self-pay | Admitting: Family Medicine

## 2020-03-04 ENCOUNTER — Ambulatory Visit (INDEPENDENT_AMBULATORY_CARE_PROVIDER_SITE_OTHER): Payer: Medicare HMO | Admitting: Family Medicine

## 2020-03-04 VITALS — BP 142/67 | HR 58 | Wt 174.7 lb

## 2020-03-04 DIAGNOSIS — G4700 Insomnia, unspecified: Secondary | ICD-10-CM | POA: Diagnosis not present

## 2020-03-04 MED ORDER — TRAZODONE HCL 50 MG PO TABS: 50.0000 mg | ORAL_TABLET | Freq: Every day | ORAL | 0 refills | Status: DC

## 2020-03-04 NOTE — Progress Notes (Addendum)
Established Patient Office Visit  Subjective:  Patient ID: Gabriel Gilbert, male    DOB: 31-Mar-1951  Age: 69 y.o. MRN: [id number]  CC: No chief complaint on file.   HPI Gabriel Gilbert presents for sleep issues.    He complains of difficulty staying asleep at night.  He says he can usually fall asleep.  He goes to bed around 11 PM and then usually wakes up around 6 AM though lately because he has not been sleeping well he has been sleeping in.  He does wear CPAP at night and according to his CPAP machine he has been getting around 6 hours total.  What happens is he will usually wake up and then cannot go back to sleep either for an hour or sometimes even the rest of the night.  He thought initially his left arm was keeping him up because he would occasionally wake up with pain but says his arm is actually gotten better so he does not think that is what is causing it because he still having sleep issues.  He reports that he has tried some over-the-counter medications including [employer] brand sleep aid, Benadryl, and melatonin.  He says he even tried alcohol.  Past Medical History:  Diagnosis Date  . Bell's palsy 12-10  . Depression   . Diabetes mellitus    type 2  . Hyperlipidemia   . Hypertension     Past Surgical History:  Procedure Laterality Date  . CATARACT EXTRACTION Left   . kidney stone removed    . pfts     normal    Family History  Problem Relation Age of Onset  . COPD Mother   . Hypertension Mother   . Alzheimer's disease Mother        ?  . Diabetes Father   . COPD Father   . Drug abuse Son   . COPD Sister   . Cancer Sister        lung    Social History   Socioeconomic History  . Marital status: Single    Spouse name: Not on file  . Number of children: Not on file  . Years of education: Not on file  . Highest education level: Not on file  Occupational History  . Not on file  Tobacco Use  . Smoking status: Current Every Day Smoker    Packs/day: 1.00     Years: 25.00    Pack years: 25.00    Types: Cigarettes    Last attempt to quit: 02/10/2013    Years since quitting: 7.0  . Smokeless tobacco: Never Used  . Tobacco comment: using electronic cigarettes  Substance and Sexual Activity  . Alcohol use: No    Alcohol/week: 0.0 standard drinks  . Drug use: No  . Sexual activity: Never    Partners: Female  Other Topics Concern  . Not on file  Social History Narrative  . Not on file   Social Determinants of Health   Financial Resource Strain:   . Difficulty of Paying Living Expenses:   Food Insecurity:   . Worried About Programme researcher, broadcasting/film/video in the Last Year:   . Barista in the Last Year:   Transportation Needs:   . Freight forwarder (Medical):   Marland Kitchen Lack of Transportation (Non-Medical):   Physical Activity:   . Days of Exercise per Week:   . Minutes of Exercise per Session:   Stress:   . Feeling of Stress :  Social Connections:   . Frequency of Communication with Friends and Family:   . Frequency of Social Gatherings with Friends and Family:   . Attends Religious Services:   . Active Member of Clubs or Organizations:   . Attends Banker Meetings:   Marland Kitchen Marital Status:   Intimate Partner Violence:   . Fear of Current or Ex-Partner:   . Emotionally Abused:   Marland Kitchen Physically Abused:   . Sexually Abused:     Outpatient Medications Prior to Visit  Medication Sig Dispense Refill  . ALPRAZolam (XANAX) 0.5 MG tablet TAKE ONE TABLET BY MOUTH DAILY AS NEEDED FOR ANXIETY 30 tablet 0  . aspirin 81 MG tablet Take 81 mg by mouth daily.      Marland Kitchen atorvastatin (LIPITOR) 40 MG tablet TAKE ONE TABLET BY MOUTH EVERY NIGHT AT BEDTIME 90 tablet 2  . B-D UF III MINI PEN NEEDLES 31G X 5 MM MISC USE ONCE DAILY 100 each 10  . glucose blood (ONE TOUCH TEST STRIPS) test strip Pt testing one time a day. 100 each 3  . Insulin Glargine (BASAGLAR KWIKPEN) 100 UNIT/ML INJECT 38 UNITS TOTAL INTO THE SKIN EVERY NIGHT AT BEDTIME 12 pen 2  .  JARDIANCE 10 MG TABS tablet TAKE ONE TABLET BY MOUTH DAILY 30 tablet 2  . lisinopril (ZESTRIL) 20 MG tablet Take 1 tablet (20 mg total) by mouth daily. 90 tablet 1  . metFORMIN (GLUCOPHAGE) 1000 MG tablet TAKE ONE TABLET BY MOUTH TWICE A DAY WITH FOOD 180 tablet 1  . Misc. Devices MISC New CPAP machine with mask and supplies through [facility].  CPAP therapy at 14 cm. water pressure.    . ONE TOUCH ULTRA TEST test strip TEST as directed 100 each 3  . PARoxetine (PAXIL) 20 MG tablet Take 1 tablet (20 mg total) by mouth daily. 30 tablet 0  . predniSONE (DELTASONE) 20 MG tablet Take 2 tablets (40 mg total) by mouth daily with breakfast. 10 tablet 0  . tamsulosin (FLOMAX) 0.4 MG CAPS capsule Take 1 capsule (0.4 mg total) by mouth daily after supper. 30 capsule 5  . venlafaxine (EFFEXOR) 37.5 MG tablet TAKE ONE TABLET BY MOUTH DAILY 30 tablet 0  . traZODone (DESYREL) 50 MG tablet Take 1 tablet (50 mg total) by mouth at bedtime. 30 tablet 0   No facility-administered medications prior to visit.    No Known Allergies  ROS Review of Systems    Objective:    Physical Exam  BP (!) 142/67 (BP Location: Left Arm, Patient Position: Sitting, Cuff Size: Normal)   Pulse (!) 58   Wt 174 lb 11.2 oz (79.2 kg)   SpO2 95%   BMI 28.20 kg/m  Wt Readings from Last 3 Encounters:  03/04/20 174 lb 11.2 oz (79.2 kg)  02/04/20 175 lb (79.4 kg)  01/29/20 175 lb 0.6 oz (79.4 kg)     Health Maintenance Due  Topic Date Due  . COVID-19 Vaccine (1) Never done    There are no preventive care reminders to display for this patient.  Lab Results  Component Value Date   TSH 1.580 01/25/2015   Lab Results  Component Value Date   WBC 15.2 (H) 07/10/2019   HGB 16.5 07/10/2019   HCT 49.5 07/10/2019   MCV 93.9 07/10/2019   PLT 333 07/10/2019   Lab Results  Component Value Date   NA 138 08/25/2019   K 5.0 08/25/2019   CO2 22 08/25/2019   GLUCOSE  172 (H) 08/25/2019   BUN 12 08/25/2019    CREATININE 0.85 08/25/2019   BILITOT 0.5 08/25/2019   ALKPHOS 61 06/06/2017   AST 12 08/25/2019   ALT 6 (L) 08/25/2019   PROT 6.4 08/25/2019   ALBUMIN 4.1 06/06/2017   CALCIUM 9.3 08/25/2019   Lab Results  Component Value Date   CHOL 73 08/25/2019   Lab Results  Component Value Date   HDL 33 (L) 08/25/2019   Lab Results  Component Value Date   LDLCALC 17 08/25/2019   Lab Results  Component Value Date   TRIG 145 08/25/2019   Lab Results  Component Value Date   CHOLHDL 2.2 08/25/2019   Lab Results  Component Value Date   HGBA1C 6.2 (A) 11/24/2019      Assessment & Plan:   Problem List Items Addressed This Visit      Other   Insomnia - Primary    Had a long discussion today about sleep and working on sleep hygiene.  Reviewed elements of going to bed and getting up at the same time making sure the bedroom is dark cool and quiet.  Avoiding caffeine.  He does drink caffeine till usually about 3:00 in the afternoon.  Avoiding screen time an hour before bedtime.  He did try trazodone 50 mg and says it was not helpful but I would like to try to push his dose a little bit higher before we give up on it and really like to try to avoid something that is habit-forming we had a long discussion about that today.  He is on Paxil so may not be able to push his trazodone very high without risk of serotonin syndrome but will monitor carefully.  Discussed slowly tapering up on the medication.  He has tried multiple over-the-counter's.  If we do need to try something prescription I would like to see if his insurance would cover Belsomra before moving to Ambien or Lunesta.      Relevant Medications   traZODone (DESYREL) 50 MG tablet      Meds ordered this encounter  Medications  . traZODone (DESYREL) 50 MG tablet    Sig: Take 1-3 tablets (50-150 mg total) by mouth at bedtime.    Dispense:  90 tablet    Refill:  0    Follow-up: No follow-ups on file.   Spent 25 minutes in  encounter.  Beatrice Lecher, MD

## 2020-03-04 NOTE — Addendum Note (Signed)
Addended by: Nani Gasser D on: 03/04/2020 05:00 PM   Modules accepted: Level of Service

## 2020-03-04 NOTE — Assessment & Plan Note (Signed)
Had a long discussion today about sleep and working on sleep hygiene.  Reviewed elements of going to bed and getting up at the same time making sure the bedroom is dark cool and quiet.  Avoiding caffeine.  He does drink caffeine till usually about 3:00 in the afternoon.  Avoiding screen time an hour before bedtime.  He did try trazodone 50 mg and says it was not helpful but I would like to try to push his dose a little bit higher before we give up on it and really like to try to avoid something that is habit-forming we had a long discussion about that today.  He is on Paxil so may not be able to push his trazodone very high without risk of serotonin syndrome but will monitor carefully.  Discussed slowly tapering up on the medication.  He has tried multiple over-the-counter's.  If we do need to try something prescription I would like to see if his insurance would cover Belsomra before moving to Ambien or Lunesta.

## 2020-03-09 ENCOUNTER — Ambulatory Visit (INDEPENDENT_AMBULATORY_CARE_PROVIDER_SITE_OTHER): Payer: Medicare HMO | Admitting: Physical Therapy

## 2020-03-09 ENCOUNTER — Encounter: Payer: Self-pay | Admitting: Physical Therapy

## 2020-03-09 ENCOUNTER — Other Ambulatory Visit: Payer: Self-pay

## 2020-03-09 DIAGNOSIS — M25512 Pain in left shoulder: Secondary | ICD-10-CM

## 2020-03-09 DIAGNOSIS — R29898 Other symptoms and signs involving the musculoskeletal system: Secondary | ICD-10-CM

## 2020-03-09 DIAGNOSIS — R293 Abnormal posture: Secondary | ICD-10-CM | POA: Diagnosis not present

## 2020-03-09 NOTE — Therapy (Signed)
[facility] [place] [address] [place], [place], [address] Phone: [phone]   Fax:  [phone]  Physical Therapy Treatment  Patient Details  Name: Gabriel Gilbert MRN: [id number] Date of Birth: 1951-06-08 Referring Provider (PT): Dr [name]   Encounter Date: 03/09/2020  PT End of Session - 03/09/20 1538    Visit Number  4    Number of Visits  12    Date for PT Re-Evaluation  05/01/20    PT Start Time  1434    PT Stop Time  1517    PT Time Calculation (min)  43 min       Past Medical History:  Diagnosis Date  . Bell's palsy 12-10  . Depression   . Diabetes mellitus    type 2  . Hyperlipidemia   . Hypertension     Past Surgical History:  Procedure Laterality Date  . CATARACT EXTRACTION Left   . kidney stone removed    . pfts     normal    There were no vitals filed for this visit.  Subjective Assessment - 03/09/20 1434    Subjective  Lt shoulder is feeling better, he is having less pain. Abduction of Lt shoulder causes discomfort,. Not doing his exercises at home, he reports lacking motivation to complete them.    Pertinent History  HTN; anxiety; depression; arthritic changes; knee pain    Patient Stated Goals  get rid of the shoulder pain and sleep on the Lt side    Currently in Pain?  Yes    Pain Score  3     Pain Location  Shoulder    Pain Orientation  Left;Lateral    Pain Descriptors / Indicators  Dull    Pain Onset  More than a month ago    Aggravating Factors   laying on Lt side    Pain Relieving Factors  changing positions         [facility] PT Assessment - 03/09/20 0001      AROM   Left Shoulder External Rotation  80 Degrees   in standing, 52 in supine      [facility] Adult PT Treatment/Exercise - 03/09/20 0001      Shoulder Exercises: Supine   Horizontal ABduction  AAROM;10 reps   with cane, 10 sec hold   External Rotation  AAROM;Left;10 reps   with cane, 5 sec hold     Shoulder Exercises:  Standing   External Rotation  Strengthening;Both;10 reps;Theraband    Theraband Level (Shoulder External Rotation)  Level 2 (Red)    Extension  AAROM;Both;10 reps   cane behind back    Other Standing Exercises  scap squeeze 10 sec x 10; L's x 10; W's x 10 with noodle       Shoulder Exercises: Pulleys   Flexion  2 minutes    ABduction  2 minutes      Shoulder Exercises: Stretch   Other Shoulder Stretches  doorway lower/middle level positions 30 sec x 2 reps - higher position placing hands on doorway without stepping through the door 30 sec x 3 reps encouraging pt to avoid pain    Star gazer stretch in supine x 45 seconds.      Manual Therapy   Manual Therapy  Soft tissue mobilization   supine, Lt delt IASTM        PT Education - 03/09/20 1554    Education Details  HEP    Person(s) Educated  Patient  Methods  Explanation       PT Long Term Goals - 03/09/20 1552      PT LONG TERM GOAL #1   Title  Increase AROM Lt shoulder to equal or greater that AROM Rt shoulder    Time  6    Period  Weeks    Status  On-going      PT LONG TERM GOAL #2   Title  Patient reports ability to sleep on Lt side for 2-4 hours at night without awakening due to pain    Time  6    Period  Weeks    Status  On-going      PT LONG TERM GOAL #3   Title  Improve cervical mobility and ROM in lateral flexion Rt and Lt with no tightness or pain    Time  6    Period  Weeks    Status  On-going      PT LONG TERM GOAL #4   Title  Independent in HEP    Time  6    Period  Weeks    Status  On-going      PT LONG TERM GOAL #5   Title  Maintain FOTO at no greater than 25% limitation    Time  6    Period  Weeks    Status  On-going        Plan - 03/09/20 1539    Clinical Impression Statement  Pt has not been consistant wit HEP due to reported lack of motivation. Pt encouraged to incorporate HEP to daily life to assist with meeting goals. All exercises were tolerated with minimal to no pain. Pt is  making progress towards all goals.    Stability/Clinical Decision Making  Stable/Uncomplicated    Rehab Potential  Good    PT Frequency  2x / week    PT Duration  6 weeks    PT Treatment/Interventions  Patient/family education;ADLs/Self Care Home Management;Cryotherapy;Electrical Stimulation;Iontophoresis 4mg /ml Dexamethasone;Moist Heat;Ultrasound;Therapeutic activities;Therapeutic exercise;Neuromuscular re-education;Manual techniques;Passive range of motion;Dry needling;Taping;Vasopneumatic Device    PT Next Visit Plan  further assessment of cervical dysfunction as indicated; encourage compliance HEP; progress with ROM Lt shoulder; postural strengthening; manual work; PROM/stretching Lt shoulder; modalities as indicated    PT Home Exercise Plan  [id number]; VHI       Patient will benefit from skilled therapeutic intervention in order to improve the following deficits and impairments:  Increased fascial restricitons, Impaired UE functional use, Pain, Decreased activity tolerance, Hypermobility, Decreased mobility, Decreased strength, Improper body mechanics, Impaired sensation  Visit Diagnosis: Acute pain of left shoulder  Abnormal posture  Other symptoms and signs involving the musculoskeletal system     Problem List Patient Active Problem List   Diagnosis Date Noted  . Insomnia 03/04/2020  . Pulmonary nodule, left 04/24/2019  . Urinary hesitancy 05/16/2018  . Nicotine dependence, uncomplicated 01/01/2016  . Hydronephrosis with urinary obstruction due to ureteral calculus 01/01/2016  . Benign non-nodular prostatic hyperplasia with lower urinary tract symptoms 01/01/2016  . Left ureteral stone 01/01/2016  . Left cervical radiculopathy 04/28/2015  . Chondromalacia of right patellofemoral joint 04/28/2015  . OSA on CPAP 06/26/2013  . Major depressive disorder, recurrent episode, severe, without mention of psychotic behavior 04/21/2013  . Generalized anxiety disorder 04/21/2013  .  Obesity 02/14/2012  . RLS (restless legs syndrome) 03/12/2011  . DERMATITIS, HANDS 08/04/2010  . BELL'S PALSY 10/26/2009  . CORTICAL SENILE CATARACT 05/09/2009  . LUMBAR RADICULOPATHY 11/26/2008  . Type 2 diabetes  mellitus with periodontal complication ([place]) 64/33/2951  . INCONTINENCE, URGE 03/13/2007  . DEPRESSION, MAJOR, RECURRENT 08/20/2006  . TOBACCO DEPENDENCE 08/20/2006  . Essential hypertension, benign 08/20/2006  . KIDNEY STONE - NEPHROLITHIASIS 08/20/2006  . SLEEP APNEA 08/20/2006    This entire session was performed under direct supervision and direction of a licensed Physical Brewing technologist. I have personally read, edited and approved of the note as written.  Kerin Perna, PTA 03/09/20 4:59 PM    Ronaldo Miyamoto, SPTA 03/09/20 3:56 PM    [facility] [place], [place], [address] Phone: [phone]   Fax:  [phone]  Name: Gabriel Gilbert MRN: [id number] Date of Birth: [birth year]

## 2020-03-10 ENCOUNTER — Telehealth (HOSPITAL_COMMUNITY): Payer: Self-pay

## 2020-03-10 MED ORDER — ALPRAZOLAM 0.5 MG PO TABS: ORAL_TABLET | ORAL | 0 refills | Status: DC

## 2020-03-10 NOTE — Telephone Encounter (Signed)
Patient needs a refill on Alprazolam sent to [facility] in [place] 

## 2020-03-10 NOTE — Telephone Encounter (Signed)
sent 

## 2020-03-14 ENCOUNTER — Ambulatory Visit (INDEPENDENT_AMBULATORY_CARE_PROVIDER_SITE_OTHER): Payer: Medicare HMO | Admitting: Physical Therapy

## 2020-03-14 ENCOUNTER — Other Ambulatory Visit: Payer: Self-pay

## 2020-03-14 ENCOUNTER — Encounter: Payer: Self-pay | Admitting: Physical Therapy

## 2020-03-14 DIAGNOSIS — R293 Abnormal posture: Secondary | ICD-10-CM | POA: Diagnosis not present

## 2020-03-14 DIAGNOSIS — R29898 Other symptoms and signs involving the musculoskeletal system: Secondary | ICD-10-CM | POA: Diagnosis not present

## 2020-03-14 DIAGNOSIS — M25512 Pain in left shoulder: Secondary | ICD-10-CM | POA: Diagnosis not present

## 2020-03-14 NOTE — Therapy (Signed)
[place], [place], [address] Phone: [phone]   Fax:  [phone]  Physical Therapy Treatment  Patient Details  Name: Gabriel Gilbert MRN: [id number] Date of Birth: 1951-03-11 Referring Provider (PT): Dr Madilyn Fireman   Encounter Date: 03/14/2020  PT End of Session - 03/14/20 1439    Visit Number  5    Number of Visits  12    Date for PT Re-Evaluation  05/01/20    PT Start Time  2778    PT Stop Time  1516    PT Time Calculation (min)  38 min       Past Medical History:  Diagnosis Date  . Bell's palsy 12-10  . Depression   . Diabetes mellitus    type 2  . Hyperlipidemia   . Hypertension     Past Surgical History:  Procedure Laterality Date  . CATARACT EXTRACTION Left   . kidney stone removed    . pfts     normal    There were no vitals filed for this visit.  Subjective Assessment - 03/14/20 1635    Subjective  Pt states he is having less Lt shoulder pain. Home exercises are not being completed due to lack of motivation. per pt report.    Pertinent History  HTN; anxiety; depression; arthritic changes; knee pain    Patient Stated Goals  get rid of the shoulder pain and sleep on the Lt side    Currently in Pain?  No/denies    Pain Score  0-No pain    Pain Location  Shoulder    Pain Orientation  Left         [facility] Adult PT Treatment/Exercise - 03/14/20 0001      Shoulder Exercises: Supine   External Rotation  AAROM;Left;10 reps   with cane, 5 sec hold   Flexion  AAROM;Left;10 reps   5 sec hold, with cane     Shoulder Exercises: Standing   External Rotation  Strengthening;Both;10 reps;Theraband    Theraband Level (Shoulder External Rotation)  Level 2 (Red)    Extension  Strengthening;Both;10 reps;Theraband    Theraband Level (Shoulder Extension)  Level 2 (Red)   on railing, demo door for home   Row  Strengthening;10 reps;Theraband   on railing, demo door for home   Theraband  Level (Shoulder Row)  Level 2 (Red)    Other Standing Exercises  scap squeeze 5 sec x 10; L's x 10; W's x 10 with noodle    against noodle   Other Standing Exercises  L' and W's, x 10, 1 set, with noodle behind back      Shoulder Exercises: Pulleys   Flexion  2 minutes   hold 3 seconds   ABduction  2 minutes   hold 3 seconds     Shoulder Exercises: ROM/Strengthening   UBE (Upper Arm Bike)  L1, 1 min foward, 1 min backward      Shoulder Exercises: Stretch   Star Gazer Stretch  3 reps;30 seconds   SPTA hand as support to reduce Lt arm discomfort       PT Education - 03/14/20 1638    Education Details  HEP; tips to work exercise into daily life easily    Person(s) Educated  Patient    Methods  Explanation    Comprehension  Verbalized understanding       PT Long Term Goals - 03/14/20 1643      PT LONG TERM GOAL #  1   Title  Increase AROM Lt shoulder to equal or greater that AROM Rt shoulder    Baseline  Rt 140 deg, Lt 135 deg-standing (03/14/20)    Time  6    Period  Weeks    Status  On-going      PT LONG TERM GOAL #2   Title  Patient reports ability to sleep on Lt side for 2-4 hours at night without awakening due to pain    Time  6    Period  Weeks    Status  On-going      PT LONG TERM GOAL #3   Title  Improve cervical mobility and ROM in lateral flexion Rt and Lt with no tightness or pain    Time  6    Period  Weeks    Status  On-going      PT LONG TERM GOAL #4   Title  Independent in HEP    Time  6    Period  Weeks    Status  On-going      PT LONG TERM GOAL #5   Title  Maintain FOTO at no greater than 25% limitation    Time  6    Period  Weeks    Status  On-going         Plan - 03/14/20 1638    Clinical Impression Statement  HEP adherance is on-going due to lack of pt motivation to complete. He was encouraged to incorporate exercises into daily life to help shoulder heal and promote a healthy lifestyle. Pt has made significant progress towards LTG 1; 5  degree difference between Rt and Lt side shoulder flexion. All other goals on-going.    Stability/Clinical Decision Making  Stable/Uncomplicated    Rehab Potential  Good    PT Frequency  2x / week    PT Duration  6 weeks    PT Treatment/Interventions  Patient/family education;ADLs/Self Care Home Management;Cryotherapy;Electrical Stimulation;Iontophoresis 4mg /ml Dexamethasone;Moist Heat;Ultrasound;Therapeutic activities;Therapeutic exercise;Neuromuscular re-education;Manual techniques;Passive range of motion;Dry needling;Taping;Vasopneumatic Device    PT Next Visit Plan  further assessment of cervical dysfunction as indicated; encourage compliance HEP; progress with ROM Lt shoulder; postural strengthening; manual work; PROM/stretching Lt shoulder; modalities as indicated    PT Home Exercise Plan  [id number]; VHI    Consulted and Agree with Plan of Care  Patient       Patient will benefit from skilled therapeutic intervention in order to improve the following deficits and impairments:  Increased fascial restricitons, Impaired UE functional use, Pain, Decreased activity tolerance, Hypermobility, Decreased mobility, Decreased strength, Improper body mechanics, Impaired sensation  Visit Diagnosis: Acute pain of left shoulder  Abnormal posture  Other symptoms and signs involving the musculoskeletal system     Problem List Patient Active Problem List   Diagnosis Date Noted  . Insomnia 03/04/2020  . Pulmonary nodule, left 04/24/2019  . Urinary hesitancy 05/16/2018  . Nicotine dependence, uncomplicated 01/01/2016  . Hydronephrosis with urinary obstruction due to ureteral calculus 01/01/2016  . Benign non-nodular prostatic hyperplasia with lower urinary tract symptoms 01/01/2016  . Left ureteral stone 01/01/2016  . Left cervical radiculopathy 04/28/2015  . Chondromalacia of right patellofemoral joint 04/28/2015  . OSA on CPAP 06/26/2013  . Major depressive disorder, recurrent episode,  severe, without mention of psychotic behavior 04/21/2013  . Generalized anxiety disorder 04/21/2013  . Obesity 02/14/2012  . RLS (restless legs syndrome) 03/12/2011  . DERMATITIS, HANDS 08/04/2010  . BELL'S PALSY 10/26/2009  . CORTICAL SENILE CATARACT 05/09/2009  .  LUMBAR RADICULOPATHY 11/26/2008  . Type 2 diabetes mellitus with periodontal complication ([facility]) 03/13/2007  . INCONTINENCE, URGE 03/13/2007  . DEPRESSION, MAJOR, RECURRENT 08/20/2006  . TOBACCO DEPENDENCE 08/20/2006  . Essential hypertension, benign 08/20/2006  . KIDNEY STONE - NEPHROLITHIASIS 08/20/2006  . SLEEP APNEA 08/20/2006    Lannie Fields, SPTA 03/14/20 4:52 PM  This entire session was performed under direct supervision and direction of a licensed Physical Environmental health practitioner. I have personally read, edited and approved of the note as written.  Mayer Camel, PTA 03/14/20 5:05 PM   [facility] [place] [address] [place] [address] [place], [place], [address] Phone: [phone]   Fax:  [phone]  Name: Gabriel Gilbert MRN: [id number] Date of Birth: [birth year]

## 2020-03-16 ENCOUNTER — Ambulatory Visit (INDEPENDENT_AMBULATORY_CARE_PROVIDER_SITE_OTHER): Payer: Medicare HMO | Admitting: Physical Therapy

## 2020-03-16 ENCOUNTER — Encounter: Payer: Self-pay | Admitting: Physical Therapy

## 2020-03-16 ENCOUNTER — Other Ambulatory Visit: Payer: Self-pay

## 2020-03-16 DIAGNOSIS — R293 Abnormal posture: Secondary | ICD-10-CM | POA: Diagnosis not present

## 2020-03-16 DIAGNOSIS — R29898 Other symptoms and signs involving the musculoskeletal system: Secondary | ICD-10-CM | POA: Diagnosis not present

## 2020-03-16 DIAGNOSIS — M25512 Pain in left shoulder: Secondary | ICD-10-CM

## 2020-03-16 NOTE — Therapy (Signed)
[place], [place], [address] Phone: [phone]   Fax:  [phone]  Physical Therapy Treatment  Patient Details  Name: Gabriel Gilbert MRN: [id number] Date of Birth: [birth year] Referring Provider (PT): Dr Madilyn Fireman   Encounter Date: 03/16/2020  PT End of Session - 03/16/20 1431    Visit Number  6    Number of Visits  12    Date for PT Re-Evaluation  05/01/20    PT Start Time  5003    PT Stop Time  1515    PT Time Calculation (min)  44 min       Past Medical History:  Diagnosis Date  . Bell's palsy 12-10  . Depression   . Diabetes mellitus    type 2  . Hyperlipidemia   . Hypertension     Past Surgical History:  Procedure Laterality Date  . CATARACT EXTRACTION Left   . kidney stone removed    . pfts     normal    There were no vitals filed for this visit.  Subjective Assessment - 03/16/20 1637    Subjective  Pt states that he is having trouble reaching up high on to shelves. He reports no pain at rest. Sleeping has improved and he is now able to sleep on Lt side per pt statement. He has not done any home exercises due to motivation.    Pertinent History  HTN; anxiety; depression; arthritic changes; knee pain    Currently in Pain?  No/denies    Pain Score  0-No pain    Pain Onset  More than a month ago         [facility] PT Assessment - 03/16/20 0001      Assessment   Medical Diagnosis  Lt shoulder dysfunction    Referring Provider (PT)  Dr Madilyn Fireman    Onset Date/Surgical Date  01/11/20    Hand Dominance  Left    Next MD Visit  03/24/20    Prior Therapy  x3 for knee pain       AROM   Right Shoulder Flexion  120 Degrees    Left Shoulder Flexion  125 Degrees    Cervical - Right Side Bend  30    Cervical - Left Side Bend  30      [facility] Adult PT Treatment/Exercise - 03/16/20 0001      Shoulder Exercises: Supine   Other Supine Exercises  scap squeezes. x10, 5 sec      Shoulder  Exercises: Sidelying   External Rotation  AROM;Left;10 reps      Shoulder Exercises: Standing   Row  Strengthening;20 reps   2 sets, reverse wall push up, VC's for form   Other Standing Exercises  wall pushups with plus, 2 sets, 10 reps   tacitle cues for form     Shoulder Exercises: Pulleys   Flexion  2 minutes   hold 3 seconds   ABduction  2 minutes   hold 3 seconds     Shoulder Exercises: Stretch   Star Gazer Stretch  3 reps;30 seconds   Lt arm only, modified with pillow under elbow and back of palm on forehead   Other Shoulder Stretches  Lt hand wall walks for shoulder flexion stretch, x 3, 15 sec   cues to back off from pain     Manual Therapy   Manual Therapy  Soft tissue mobilization   supine, STM to Lt lateral deltoid to decrease  fascial restrictions and improve ROM.             PT Education - 03/16/20 1639    Education Details  HEP compliance    Person(s) Educated  Patient    Methods  Explanation    Comprehension  Verbalized understanding          PT Long Term Goals - 03/16/20 1443      PT LONG TERM GOAL #1   Title  Increase AROM Lt shoulder to equal or greater that AROM Rt shoulder    Baseline  125 Lt, 120 Rt(03/16/20)    Time  6    Period  Weeks    Status  Achieved      PT LONG TERM GOAL #2   Title  Patient reports ability to sleep on Lt side for 2-4 hours at night without awakening due to pain    Baseline  unaware of time frame, sleeping ok on Lt side in certain position    Time  6    Period  Weeks    Status  Partially Met      PT LONG TERM GOAL #3   Title  Improve cervical mobility and ROM in lateral flexion Rt and Lt with no tightness or pain    Baseline  30 deg bilat    Time  6    Period  Weeks    Status  Achieved      PT LONG TERM GOAL #4   Title  Independent in HEP    Time  6    Period  Weeks    Status  On-going      PT LONG TERM GOAL #5   Title  Maintain FOTO at no greater than 25% limitation    Time  6    Period  Weeks     Status  On-going            Plan - 03/16/20 1639    Clinical Impression Statement  Pt has made good progress with LTG's; he has met his goals for cervical and shoulder ROM. He has partially met his goal of sleeping on Lt side without waking from pain. Pt tolerated treatment well with mild to no increases in Lt shoulder discomfort; only occured with end range flexion or ER. Remaining goals are on-going.    Stability/Clinical Decision Making  Stable/Uncomplicated    Rehab Potential  Good    PT Frequency  2x / week    PT Duration  6 weeks    PT Treatment/Interventions  Patient/family education;ADLs/Self Care Home Management;Cryotherapy;Electrical Stimulation;Iontophoresis 33m/ml Dexamethasone;Moist Heat;Ultrasound;Therapeutic activities;Therapeutic exercise;Neuromuscular re-education;Manual techniques;Passive range of motion;Dry needling;Taping;Vasopneumatic Device    PT Next Visit Plan  further assessment of cervical dysfunction as indicated; encourage compliance HEP; progress with ROM Lt shoulder; postural strengthening; manual work; PROM/stretching Lt shoulder; modalities as indicated    PT Home Exercise Plan  CAltmar VHI    Consulted and Agree with Plan of Care  Patient       Patient will benefit from skilled therapeutic intervention in order to improve the following deficits and impairments:  Increased fascial restricitons, Impaired UE functional use, Pain, Decreased activity tolerance, Hypermobility, Decreased mobility, Decreased strength, Improper body mechanics, Impaired sensation  Visit Diagnosis: Acute pain of left shoulder  Abnormal posture  Other symptoms and signs involving the musculoskeletal system     Problem List Patient Active Problem List   Diagnosis Date Noted  . Insomnia 03/04/2020  . Pulmonary nodule, left 04/24/2019  .  Urinary hesitancy 05/16/2018  . Nicotine dependence, uncomplicated 92/78/0044  . Hydronephrosis with urinary obstruction due to ureteral  calculus 01/01/2016  . Benign non-nodular prostatic hyperplasia with lower urinary tract symptoms 01/01/2016  . Left ureteral stone 01/01/2016  . Left cervical radiculopathy 04/28/2015  . Chondromalacia of right patellofemoral joint 04/28/2015  . OSA on CPAP 06/26/2013  . Major depressive disorder, recurrent episode, severe, without mention of psychotic behavior 04/21/2013  . Generalized anxiety disorder 04/21/2013  . Obesity 02/14/2012  . RLS (restless legs syndrome) 03/12/2011  . DERMATITIS, HANDS 08/04/2010  . BELL'S PALSY 10/26/2009  . CORTICAL SENILE CATARACT 05/09/2009  . LUMBAR RADICULOPATHY 11/26/2008  . Type 2 diabetes mellitus with periodontal complication ([place]) 71/58/0638  . INCONTINENCE, URGE 03/13/2007  . DEPRESSION, MAJOR, RECURRENT 08/20/2006  . TOBACCO DEPENDENCE 08/20/2006  . Essential hypertension, benign 08/20/2006  . KIDNEY STONE - NEPHROLITHIASIS 08/20/2006  . SLEEP APNEA 08/20/2006     Ronaldo Miyamoto, SPTA 03/16/20 4:46 PM  This entire session was performed under direct supervision and direction of a licensed Physical Brewing technologist. I have personally read, edited and approved of the note as written.  Kerin Perna, PTA 03/16/20 5:17 PM   [place], [place], [address] Phone: [phone]   Fax:  [phone]  Name: Gabriel Gilbert MRN: [id number] Date of Birth: February 10, 1951

## 2020-03-21 ENCOUNTER — Encounter: Payer: Medicare HMO | Admitting: Physical Therapy

## 2020-03-23 ENCOUNTER — Encounter: Payer: Medicare HMO | Admitting: Rehabilitative and Restorative Service Providers"

## 2020-03-23 ENCOUNTER — Other Ambulatory Visit: Payer: Self-pay | Admitting: Family Medicine

## 2020-03-23 ENCOUNTER — Other Ambulatory Visit: Payer: Self-pay

## 2020-03-23 MED ORDER — ATORVASTATIN CALCIUM 40 MG PO TABS: 40.0000 mg | ORAL_TABLET | Freq: Every day | ORAL | 2 refills | Status: DC

## 2020-03-24 ENCOUNTER — Ambulatory Visit (INDEPENDENT_AMBULATORY_CARE_PROVIDER_SITE_OTHER): Payer: Medicare HMO | Admitting: Family Medicine

## 2020-03-24 ENCOUNTER — Encounter: Payer: Self-pay | Admitting: Family Medicine

## 2020-03-24 VITALS — BP 104/54 | HR 82 | Ht 66.0 in | Wt 175.0 lb

## 2020-03-24 DIAGNOSIS — Z794 Long term (current) use of insulin: Secondary | ICD-10-CM | POA: Diagnosis not present

## 2020-03-24 DIAGNOSIS — I10 Essential (primary) hypertension: Secondary | ICD-10-CM | POA: Diagnosis not present

## 2020-03-24 DIAGNOSIS — E11630 Type 2 diabetes mellitus with periodontal disease: Secondary | ICD-10-CM

## 2020-03-24 LAB — POCT GLYCOSYLATED HEMOGLOBIN (HGB A1C): Hemoglobin A1C: 6.6 % — AB (ref 4.0–5.6)

## 2020-03-24 NOTE — Progress Notes (Signed)
Taking 20 U of basaglar

## 2020-03-24 NOTE — Assessment & Plan Note (Signed)
Pressure is little borderline low today but was actually high last time he was here so I am not can make any adjustments today we will just continue to monitor it.

## 2020-03-24 NOTE — Patient Instructions (Addendum)
Please restart your cholesterol pill.  It reduces your risk for cardiovascular stroke and/or heart attack.  If you are having problems with it or you do not feel good on it then you need to let me know.

## 2020-03-24 NOTE — Assessment & Plan Note (Signed)
Hemoglobin A1c is up from previous at 6.6 today but still at goal.  Just continue to work on diet and exercise.

## 2020-03-24 NOTE — Progress Notes (Signed)
Established Patient Office Visit  Subjective:  Patient ID: Gabriel Gilbert, male    DOB: 12-29-1950  Age: 69 y.o. MRN: [id number]  CC:  Chief Complaint  Patient presents with  . Diabetes    HPI Gabriel Gilbert presents for   Diabetes - no hypoglycemic events. No wounds or sores that are not healing well. No increased thirst or urination. Checking glucose at home. Taking medications as prescribed without any side effects.  Hypertension- Pt denies chest pain, SOB, dizziness, or heart palpitations.  Taking meds as directed w/o problems.  Denies medication side effects.    F/U inosmnia -    Past Medical History:  Diagnosis Date  . Bell's palsy 12-10  . Depression   . Diabetes mellitus    type 2  . Hyperlipidemia   . Hypertension     Past Surgical History:  Procedure Laterality Date  . CATARACT EXTRACTION Left   . kidney stone removed    . pfts     normal    Family History  Problem Relation Age of Onset  . COPD Mother   . Hypertension Mother   . Alzheimer's disease Mother        ?  . Diabetes Father   . COPD Father   . Drug abuse Son   . COPD Sister   . Cancer Sister        lung    Social History   Socioeconomic History  . Marital status: Single    Spouse name: Not on file  . Number of children: Not on file  . Years of education: Not on file  . Highest education level: Not on file  Occupational History  . Not on file  Tobacco Use  . Smoking status: Current Every Day Smoker    Packs/day: 1.00    Years: 25.00    Pack years: 25.00    Types: Cigarettes    Last attempt to quit: 02/10/2013    Years since quitting: 7.1  . Smokeless tobacco: Never Used  . Tobacco comment: using electronic cigarettes  Substance and Sexual Activity  . Alcohol use: No    Alcohol/week: 0.0 standard drinks  . Drug use: No  . Sexual activity: Never    Partners: Female  Other Topics Concern  . Not on file  Social History Narrative  . Not on file   Social Determinants of  Health   Financial Resource Strain:   . Difficulty of Paying Living Expenses:   Food Insecurity:   . Worried About Programme researcher, broadcasting/film/video in the Last Year:   . Barista in the Last Year:   Transportation Needs:   . Freight forwarder (Medical):   Marland Kitchen Lack of Transportation (Non-Medical):   Physical Activity:   . Days of Exercise per Week:   . Minutes of Exercise per Session:   Stress:   . Feeling of Stress :   Social Connections:   . Frequency of Communication with Friends and Family:   . Frequency of Social Gatherings with Friends and Family:   . Attends Religious Services:   . Active Member of Clubs or Organizations:   . Attends Banker Meetings:   Marland Kitchen Marital Status:   Intimate Partner Violence:   . Fear of Current or Ex-Partner:   . Emotionally Abused:   Marland Kitchen Physically Abused:   . Sexually Abused:     Outpatient Medications Prior to Visit  Medication Sig Dispense Refill  . ALPRAZolam Prudy Feeler)  0.5 MG tablet TAKE ONE TABLET BY MOUTH DAILY AS NEEDED FOR ANXIETY 30 tablet 0  . aspirin 81 MG tablet Take 81 mg by mouth daily.      . B-D UF III MINI PEN NEEDLES 31G X 5 MM MISC USE ONCE DAILY 100 each 10  . glucose blood (ONE TOUCH TEST STRIPS) test strip Pt testing one time a day. 100 each 3  . Insulin Glargine (BASAGLAR KWIKPEN) 100 UNIT/ML INJECT 38 UNITS TOTAL INTO THE SKIN EVERY NIGHT AT BEDTIME 12 pen 2  . JARDIANCE 10 MG TABS tablet TAKE ONE TABLET BY MOUTH DAILY 30 tablet 2  . lisinopril (ZESTRIL) 20 MG tablet Take 1 tablet (20 mg total) by mouth daily. 90 tablet 1  . metFORMIN (GLUCOPHAGE) 1000 MG tablet TAKE ONE TABLET BY MOUTH TWICE A DAY WITH FOOD 180 tablet 1  . Misc. Devices MISC New CPAP machine with mask and supplies through [facility].  CPAP therapy at 14 cm. water pressure.    . ONE TOUCH ULTRA TEST test strip TEST as directed 100 each 3  . traZODone (DESYREL) 50 MG tablet Take 1-3 tablets (50-150 mg total) by mouth at bedtime. 90 tablet 0   . venlafaxine (EFFEXOR) 37.5 MG tablet TAKE ONE TABLET BY MOUTH DAILY 30 tablet 0  . atorvastatin (LIPITOR) 40 MG tablet Take 1 tablet (40 mg total) by mouth at bedtime. (Patient not taking: Reported on 03/24/2020) 90 tablet 2  . PARoxetine (PAXIL) 20 MG tablet Take 1 tablet (20 mg total) by mouth daily. 30 tablet 0  . predniSONE (DELTASONE) 20 MG tablet Take 2 tablets (40 mg total) by mouth daily with breakfast. 10 tablet 0  . tamsulosin (FLOMAX) 0.4 MG CAPS capsule Take 1 capsule (0.4 mg total) by mouth daily after supper. 30 capsule 5   No facility-administered medications prior to visit.    No Known Allergies  ROS Review of Systems    Objective:    Physical Exam  Constitutional: He is oriented to person, place, and time. He appears well-developed and well-nourished.  HENT:  Head: Normocephalic and atraumatic.  Cardiovascular: Normal rate, regular rhythm and normal heart sounds.  Pulmonary/Chest: Effort normal and breath sounds normal.  Neurological: He is alert and oriented to person, place, and time.  Skin: Skin is warm and dry.  Psychiatric: He has a normal mood and affect. His behavior is normal.    BP (!) 104/54   Pulse 82   Ht 5\' 6"  (1.676 m)   Wt 175 lb (79.4 kg)   SpO2 100%   BMI 28.25 kg/m  Wt Readings from Last 3 Encounters:  03/24/20 175 lb (79.4 kg)  03/04/20 174 lb 11.2 oz (79.2 kg)  02/04/20 175 lb (79.4 kg)     There are no preventive care reminders to display for this patient.  There are no preventive care reminders to display for this patient.  Lab Results  Component Value Date   TSH 1.580 01/25/2015   Lab Results  Component Value Date   WBC 15.2 (H) 07/10/2019   HGB 16.5 07/10/2019   HCT 49.5 07/10/2019   MCV 93.9 07/10/2019   PLT 333 07/10/2019   Lab Results  Component Value Date   NA 139 03/25/2020   K 4.5 03/25/2020   CO2 29 03/25/2020   GLUCOSE 100 (H) 03/25/2020   BUN 18 03/25/2020   CREATININE 0.84 03/25/2020   BILITOT 0.5  08/25/2019   ALKPHOS 61 06/06/2017   AST 12 08/25/2019  ALT 6 (L) 08/25/2019   PROT 6.4 08/25/2019   ALBUMIN 4.1 06/06/2017   CALCIUM 9.7 03/25/2020   Lab Results  Component Value Date   CHOL 73 08/25/2019   Lab Results  Component Value Date   HDL 33 (L) 08/25/2019   Lab Results  Component Value Date   LDLCALC 17 08/25/2019   Lab Results  Component Value Date   TRIG 145 08/25/2019   Lab Results  Component Value Date   CHOLHDL 2.2 08/25/2019   Lab Results  Component Value Date   HGBA1C 6.6 (A) 03/24/2020      Assessment & Plan:   Problem List Items Addressed This Visit      Cardiovascular and Mediastinum   Essential hypertension, benign    Pressure is little borderline low today but was actually high last time he was here so I am not can make any adjustments today we will just continue to monitor it.      Relevant Orders   BASIC METABOLIC PANEL WITH GFR (Completed)     Endocrine   Type 2 diabetes mellitus with periodontal complication (HCC) - Primary    Hemoglobin A1c is up from previous at 6.6 today but still at goal.  Just continue to work on diet and exercise.      Relevant Orders   POCT glycosylated hemoglobin (Hb A1C) (Completed)   BASIC METABOLIC PANEL WITH GFR (Completed)     Did encourage him to get his Covid vaccine.  He says he is on the fence about it.   No orders of the defined types were placed in this encounter.   Follow-up: Return in about 3 months (around 06/24/2020), or if symptoms worsen or fail to improve, for Diabetes follow-up.    Nani Gasser, MD

## 2020-03-25 DIAGNOSIS — I10 Essential (primary) hypertension: Secondary | ICD-10-CM | POA: Diagnosis not present

## 2020-03-25 DIAGNOSIS — E11630 Type 2 diabetes mellitus with periodontal disease: Secondary | ICD-10-CM | POA: Diagnosis not present

## 2020-03-25 DIAGNOSIS — Z794 Long term (current) use of insulin: Secondary | ICD-10-CM | POA: Diagnosis not present

## 2020-03-25 LAB — BASIC METABOLIC PANEL WITH GFR
BUN: 18 mg/dL (ref 7–25)
CO2: 29 mmol/L (ref 20–32)
Calcium: 9.7 mg/dL (ref 8.6–10.3)
Chloride: 102 mmol/L (ref 98–110)
Creat: 0.84 mg/dL (ref 0.70–1.25)
GFR, Est African American: 105 mL/min/{1.73_m2} (ref >=60)
GFR, Est Non African American: 91 mL/min/{1.73_m2} (ref >=60)
Glucose, Bld: 100 mg/dL — ABNORMAL HIGH (ref 65–99)
Potassium: 4.5 mmol/L (ref 3.5–5.3)
Sodium: 139 mmol/L (ref 135–146)

## 2020-03-28 NOTE — Progress Notes (Signed)
All labs are normal. 

## 2020-04-01 ENCOUNTER — Ambulatory Visit (INDEPENDENT_AMBULATORY_CARE_PROVIDER_SITE_OTHER): Payer: Medicare HMO | Admitting: Physical Therapy

## 2020-04-01 ENCOUNTER — Other Ambulatory Visit: Payer: Self-pay

## 2020-04-01 DIAGNOSIS — R293 Abnormal posture: Secondary | ICD-10-CM

## 2020-04-01 DIAGNOSIS — M25512 Pain in left shoulder: Secondary | ICD-10-CM

## 2020-04-01 DIAGNOSIS — R29898 Other symptoms and signs involving the musculoskeletal system: Secondary | ICD-10-CM

## 2020-04-01 NOTE — Patient Instructions (Signed)
Over Head Pull: Narrow Grip     [place] [phone]   On back, knees bent, feet flat, band across thighs, elbows straight but relaxed. Pull hands apart (start). Keeping elbows straight, bring arms up and over head, hands toward floor. Keep pull steady on band. Hold momentarily. Return slowly, keeping pull steady, back to start. Repeat __10_ times. Band color __red ____   Side Pull: Double Arm   On back, knees bent, feet flat. Arms perpendicular to body, shoulder level, elbows straight but relaxed. Pull arms out to sides, elbows straight. Resistance band comes across collarbones, hands toward floor. Hold momentarily. Slowly return to starting position. Repeat _10__ times. Band color __red__   Sash   On back, knees bent, feet flat, left hand on left hip, right hand above left. Pull right arm DIAGONALLY (hip to shoulder) across chest. Bring right arm along head toward floor. Hold momentarily. Slowly return to starting position. Repeat _10__ times. Do with left arm. Band color __red____   Shoulder Rotation: Double Arm   On back, knees bent, feet flat, elbows tucked at sides, bent 90, hands palms up. Pull hands apart and down toward floor, keeping elbows near sides. Hold momentarily. Slowly return to starting position. Repeat _10__ times. Band color ___red___    

## 2020-04-04 NOTE — Therapy (Signed)
[place], [place], [address] Phone: [phone]   Fax:  [phone]  Physical Therapy Treatment  Patient Details  Name: Gabriel Gilbert MRN: [id number] Date of Birth: 1951/05/12 Referring Provider (PT): Dr Madilyn Fireman   Encounter Date: 04/01/2020  PT End of Session - 04/04/20 0919    Visit Number  7    Number of Visits  12    Date for PT Re-Evaluation  05/01/20    PT Start Time  8184   pt arrived late   PT Stop Time  1356    PT Time Calculation (min)  32 min    Activity Tolerance  Patient tolerated treatment well;No increased pain    Behavior During Therapy  WFL for tasks assessed/performed       Past Medical History:  Diagnosis Date  . Bell's palsy 12-10  . Depression   . Diabetes mellitus    type 2  . Hyperlipidemia   . Hypertension     Past Surgical History:  Procedure Laterality Date  . CATARACT EXTRACTION Left   . kidney stone removed    . pfts     normal    There were no vitals filed for this visit.  Subjective Assessment - 04/04/20 0922    Subjective  Pt reports he felt his shoulder was improving, but now that he has not been to therapy he noticed a decline. He continues to have difficulty sleeping; especially on Lt side. He has noticed more numbness in LUE to fingers lately.    Pertinent History  HTN; anxiety; depression; arthritic changes; knee pain    Patient Stated Goals  get rid of the shoulder pain and sleep on the Lt side    Currently in Pain?  No/denies    Pain Score  0-No pain    Pain Onset  More than a month ago         [facility] PT Assessment - 04/04/20 0001      Assessment   Medical Diagnosis  Lt shoulder dysfunction    Referring Provider (PT)  Dr Madilyn Fireman    Onset Date/Surgical Date  01/11/20    Hand Dominance  Left    Next MD Visit  03/24/20    Prior Therapy  x3 for knee pain       [facility] Adult PT Treatment/Exercise - 04/04/20 0001      Shoulder  Exercises: Supine   Horizontal ABduction  Strengthening;Both;10 reps    Theraband Level (Shoulder Horizontal ABduction)  Level 2 (Red)    External Rotation  Strengthening;Both;10 reps    Theraband Level (Shoulder External Rotation)  Level 2 (Red)    Flexion  Left;10 reps;Strengthening;AROM   5 sec hold, with cane   Theraband Level (Shoulder Flexion)  Level 2 (Red)   overhead pull   Diagonals  Strengthening;Both;10 reps;Theraband    Theraband Level (Shoulder Diagonals)  Level 2 (Red)      Shoulder Exercises: Sidelying   Other Sidelying Exercises  open book x 8 reps each side      Shoulder Exercises: Pulleys   Flexion  2 minutes    ABduction  2 minutes      Shoulder Exercises: ROM/Strengthening   UBE (Upper Arm Bike)  L1, 1 min foward, 1 min backward             PT Education - 04/04/20 0922    Education Details  HEP, red band issued    Person(s) Educated  Patient  Methods  Explanation;Handout;Demonstration;Tactile cues;Verbal cues    Comprehension  Verbalized understanding;Returned demonstration          PT Long Term Goals - 03/16/20 1443      PT LONG TERM GOAL #1   Title  Increase AROM Lt shoulder to equal or greater that AROM Rt shoulder    Baseline  125 Lt, 120 Rt(03/16/20)    Time  6    Period  Weeks    Status  Achieved      PT LONG TERM GOAL #2   Title  Patient reports ability to sleep on Lt side for 2-4 hours at night without awakening due to pain    Baseline  unaware of time frame, sleeping ok on Lt side in certain position    Time  6    Period  Weeks    Status  Partially Met      PT LONG TERM GOAL #3   Title  Improve cervical mobility and ROM in lateral flexion Rt and Lt with no tightness or pain    Baseline  30 deg bilat    Time  6    Period  Weeks    Status  Achieved      PT LONG TERM GOAL #4   Title  Independent in HEP    Time  6    Period  Weeks    Status  On-going      PT LONG TERM GOAL #5   Title  Maintain FOTO at no greater than 25%  limitation    Time  6    Period  Weeks    Status  On-going            Plan - 04/04/20 0919    Clinical Impression Statement  Pt has been away from therapy for 2 wks and is now reporting flare of symptoms.  Continued challenge with compliance to HEP; modified HEP and issued red band for exercises. Continued encouragement provided. Pt tolerated all exercises well, with out increase in pain. Making gradual progress towards goals.    Stability/Clinical Decision Making  Stable/Uncomplicated    Rehab Potential  Good    PT Frequency  2x / week    PT Duration  6 weeks    PT Treatment/Interventions  Patient/family education;ADLs/Self Care Home Management;Cryotherapy;Electrical Stimulation;Iontophoresis 66m/ml Dexamethasone;Moist Heat;Ultrasound;Therapeutic activities;Therapeutic exercise;Neuromuscular re-education;Manual techniques;Passive range of motion;Dry needling;Taping;Vasopneumatic Device    PT Next Visit Plan  progress with ROM Lt shoulder; postural strengthening; manual work; PROM/stretching Lt shoulder; modalities as indicated    PT Home Exercise Plan  [facility]    Consulted and Agree with Plan of Care  Patient       Patient will benefit from skilled therapeutic intervention in order to improve the following deficits and impairments:  Increased fascial restricitons, Impaired UE functional use, Pain, Decreased activity tolerance, Hypermobility, Decreased mobility, Decreased strength, Improper body mechanics, Impaired sensation  Visit Diagnosis: Acute pain of left shoulder  Abnormal posture  Other symptoms and signs involving the musculoskeletal system     Problem List Patient Active Problem List   Diagnosis Date Noted  . Insomnia 03/04/2020  . Pulmonary nodule, left 04/24/2019  . Urinary hesitancy 05/16/2018  . Nicotine dependence, uncomplicated 032/44/0102 . Hydronephrosis with urinary obstruction due to ureteral calculus 01/01/2016  . Benign non-nodular prostatic  hyperplasia with lower urinary tract symptoms 01/01/2016  . Left ureteral stone 01/01/2016  . Left cervical radiculopathy 04/28/2015  . Chondromalacia of right patellofemoral joint 04/28/2015  .  OSA on CPAP 06/26/2013  . Major depressive disorder, recurrent episode, severe, without mention of psychotic behavior 04/21/2013  . Generalized anxiety disorder 04/21/2013  . Obesity 02/14/2012  . RLS (restless legs syndrome) 03/12/2011  . DERMATITIS, HANDS 08/04/2010  . BELL'S PALSY 10/26/2009  . CORTICAL SENILE CATARACT 05/09/2009  . LUMBAR RADICULOPATHY 11/26/2008  . Type 2 diabetes mellitus with periodontal complication ([place]) 96/88/6484  . INCONTINENCE, URGE 03/13/2007  . DEPRESSION, MAJOR, RECURRENT 08/20/2006  . TOBACCO DEPENDENCE 08/20/2006  . Essential hypertension, benign 08/20/2006  . KIDNEY STONE - NEPHROLITHIASIS 08/20/2006  . SLEEP APNEA 08/20/2006   Kerin Perna, PTA 04/04/20 9:24 AM  [place], [place], [address] Phone: [phone]   Fax:  [phone]  Name: Gabriel Gilbert MRN: [id number] Date of Birth: 09-28-1951

## 2020-04-08 ENCOUNTER — Other Ambulatory Visit: Payer: Self-pay

## 2020-04-08 ENCOUNTER — Ambulatory Visit: Payer: Medicare HMO | Admitting: Physical Therapy

## 2020-04-08 ENCOUNTER — Telehealth: Payer: Self-pay

## 2020-04-08 DIAGNOSIS — R29898 Other symptoms and signs involving the musculoskeletal system: Secondary | ICD-10-CM | POA: Diagnosis not present

## 2020-04-08 DIAGNOSIS — M25512 Pain in left shoulder: Secondary | ICD-10-CM | POA: Diagnosis not present

## 2020-04-08 DIAGNOSIS — R293 Abnormal posture: Secondary | ICD-10-CM

## 2020-04-08 MED ORDER — DOXEPIN HCL 3 MG PO TABS: 3.0000 mg | ORAL_TABLET | Freq: Every day | ORAL | 1 refills | Status: DC

## 2020-04-08 NOTE — Therapy (Addendum)
[place], [place], [address] Phone: [phone]   Fax:  [phone]  Physical Therapy Treatment  Patient Details  Name: Gabriel Gilbert MRN: [id number] Date of Birth: 11/20/1950 Referring Provider (PT): Dr Madilyn Fireman   Encounter Date: 04/08/2020  PT End of Session - 04/08/20 1453    Visit Number  8    Number of Visits  12    Date for PT Re-Evaluation  05/01/20    PT Start Time  6720    PT Stop Time  1540    PT Time Calculation (min)  42 min    Activity Tolerance  Patient tolerated treatment well;No increased pain    Behavior During Therapy  WFL for tasks assessed/performed       Past Medical History:  Diagnosis Date  . Bell's palsy 12-10  . Depression   . Diabetes mellitus    type 2  . Hyperlipidemia   . Hypertension     Past Surgical History:  Procedure Laterality Date  . CATARACT EXTRACTION Left   . kidney stone removed    . pfts     normal    There were no vitals filed for this visit.  Subjective Assessment - 04/08/20 1501    Subjective  Pt reports his Lt shoulder wakes him in night if he sleeps on it; also goes numb when sleeping. Pt  reports he has not completed the HEP.    Pertinent History  HTN; anxiety; depression; arthritic changes; knee pain    Currently in Pain?  No/denies    Pain Score  0-No pain    Pain Location  Shoulder    Pain Orientation  Left    Aggravating Factors   reaching back (horiz abdct); sleeping on arm    Pain Relieving Factors  avoiding aggrivating motions         [facility] PT Assessment - 04/08/20 0001      Assessment   Medical Diagnosis  Lt shoulder dysfunction    Referring Provider (PT)  Dr Madilyn Fireman    Onset Date/Surgical Date  01/11/20    Hand Dominance  Left    Next MD Visit  to be scheduled.    Prior Therapy  x3 for knee pain       Observation/Other Assessments   Focus on Therapeutic Outcomes (FOTO)   32% limitation      AROM   Right  Shoulder Extension  52 Degrees    Right Shoulder Flexion  132 Degrees    Right Shoulder ABduction  130 Degrees    Right Shoulder External Rotation  90 Degrees   standing, arm abdct 90 deg   Left Shoulder Extension  50 Degrees    Left Shoulder Flexion  137 Degrees    Left Shoulder ABduction  125 Degrees    Left Shoulder External Rotation  86 Degrees       [facility] Adult PT Treatment/Exercise - 04/08/20 0001      Shoulder Exercises: Supine   Horizontal ABduction  Strengthening;Both;10 reps    Theraband Level (Shoulder Horizontal ABduction)  Level 2 (Red)    External Rotation  Strengthening;Both;10 reps;12 reps    Theraband Level (Shoulder External Rotation)  Level 2 (Red)    Flexion  Both;10 reps;Theraband    Theraband Level (Shoulder Flexion)  Level 2 (Red)    Diagonals  Strengthening;Theraband;Left;Right;12 reps    Theraband Level (Shoulder Diagonals)  Level 2 (Red)      Shoulder Exercises: Pulleys  Flexion  2 minutes    ABduction  2 minutes      Shoulder Exercises: ROM/Strengthening   UBE (Upper Arm Bike)  L2: 2 min forward, 2 min backward       Shoulder Exercises: Stretch   Other Shoulder Stretches  low and midlevel doorway stretch x 10 sec x 2 reps (before / after IASTM)    Other Shoulder Stretches  bicep stretch bilat on door frame x 10 sec x 2 reps       Manual Therapy   Manual therapy comments  I strip of sensitive skin tape place with 15% stretch over biceps (belly) to deltoid tuberosity; perpendicular strip applied at deltoid tuberosity to decompress tissue and decrease pain.     Soft tissue mobilization  IASTM and STM to Lt bicep and deltoid       Neck Exercises: Stretches   Other Neck Stretches  Rt lateral flexion for upper trap stretch x 10 sec x 3 reps                   PT Long Term Goals - 04/08/20 1549      PT LONG TERM GOAL #1   Title  Increase AROM Lt shoulder to equal or greater that AROM Rt shoulder    Time  6    Period  Weeks    Status   Achieved      PT LONG TERM GOAL #2   Title  Patient reports ability to sleep on Lt side for 2-4 hours at night without awakening due to pain    Time  6    Period  Weeks    Status  Partially Met      PT LONG TERM GOAL #3   Title  Improve cervical mobility and ROM in lateral flexion Rt and Lt with no tightness or pain    Baseline  30 deg bilat    Time  6    Period  Weeks    Status  Achieved      PT LONG TERM GOAL #4   Title  Independent in HEP    Time  6    Period  Weeks    Status  On-going      PT LONG TERM GOAL #5   Title  Maintain FOTO at no greater than 25% limitation    Baseline  32% limitation    Time  6    Period  Weeks    Status  On-going            Plan - 04/08/20 1551    Clinical Impression Statement   Pt's ROM in both shoulders has varied over the visits.Pt  reported reduced pain in Lt deltoid with shoulder ext after IASTM/STM to bicep/deltoid.  Trial of sensitive skin tape applied to shoulder to decrease pain and decompress tissue.Pt continues to report non-compliance with HEP despite multiple attempts with encouragment.   Pt has partially met his goals at this time.  Will speak with supervising PT regarding pt's progress and plan going forward.    Stability/Clinical Decision Making  Stable/Uncomplicated    Rehab Potential  Good    PT Frequency  2x / week    PT Duration  6 weeks    PT Treatment/Interventions  Patient/family education;ADLs/Self Care Home Management;Cryotherapy;Electrical Stimulation;Iontophoresis 88m/ml Dexamethasone;Moist Heat;Ultrasound;Therapeutic activities;Therapeutic exercise;Neuromuscular re-education;Manual techniques;Passive range of motion;Dry needling;Taping;Vasopneumatic Device    PT Home Exercise Plan  [id number]; [facility]    Consulted and Agree with Plan of Care  Patient       Patient will benefit from skilled therapeutic intervention in order to improve the following deficits and impairments:  Increased fascial restricitons, Impaired UE  functional use, Pain, Decreased activity tolerance, Hypermobility, Decreased mobility, Decreased strength, Improper body mechanics, Impaired sensation  Visit Diagnosis: Acute pain of left shoulder  Abnormal posture  Other symptoms and signs involving the musculoskeletal system     Problem List Patient Active Problem List   Diagnosis Date Noted  . Insomnia 03/04/2020  . Pulmonary nodule, left 04/24/2019  . Urinary hesitancy 05/16/2018  . Nicotine dependence, uncomplicated 36/62/9476  . Hydronephrosis with urinary obstruction due to ureteral calculus 01/01/2016  . Benign non-nodular prostatic hyperplasia with lower urinary tract symptoms 01/01/2016  . Left ureteral stone 01/01/2016  . Left cervical radiculopathy 04/28/2015  . Chondromalacia of right patellofemoral joint 04/28/2015  . OSA on CPAP 06/26/2013  . Major depressive disorder, recurrent episode, severe, without mention of psychotic behavior 04/21/2013  . Generalized anxiety disorder 04/21/2013  . Obesity 02/14/2012  . RLS (restless legs syndrome) 03/12/2011  . DERMATITIS, HANDS 08/04/2010  . BELL'S PALSY 10/26/2009  . CORTICAL SENILE CATARACT 05/09/2009  . LUMBAR RADICULOPATHY 11/26/2008  . Type 2 diabetes mellitus with periodontal complication ([place]) 54/65/0354  . INCONTINENCE, URGE 03/13/2007  . DEPRESSION, MAJOR, RECURRENT 08/20/2006  . TOBACCO DEPENDENCE 08/20/2006  . Essential hypertension, benign 08/20/2006  . KIDNEY STONE - NEPHROLITHIASIS 08/20/2006  . SLEEP APNEA 08/20/2006   Kerin Perna, PTA 04/08/20 4:05 PM  [facility] [place], [place], [address] Phone: [phone]   Fax:  [phone]  Name: Gabriel Gilbert MRN: [id number] Date of Birth: [birth year]  PHYSICAL THERAPY DISCHARGE SUMMARY  Visits from Start of Care: 7  Current functional level related to goals / functional outcomes: See last progress note for  discharge status.    Remaining deficits: Patient reports and demonstrated only minimal improvement in symptoms with PT. He was not compliant with HEP.    Education / Equipment: HEP; TB  Plan: Patient agrees to discharge.  Patient goals were partially met. Patient is being discharged due to lack of progress.  ?????     Celyn P. Helene Kelp PT, MPH 05/06/20 2:35 PM

## 2020-04-08 NOTE — Telephone Encounter (Signed)
Patient advised, will start new med and is agreeable to not taking with alcohol

## 2020-04-08 NOTE — Telephone Encounter (Signed)
Patient called stating that he is still having trouble with sleep. He is able to fall asleep initially, but wakes up at 4:00 every morning and is unable to go back to sleep. Reports that he is taking 3 tablets QHS and also admits to drinking several glasses of whiskey with his meds in efforts to sleep better. Discussed with patient that this was not safe and he needs to STOP drinking alcohol with medications, especially for sleep.   He is wanting alternative for sleep.

## 2020-04-08 NOTE — Telephone Encounter (Signed)
Ok, removed from med list. Will try doxepin. I have really great success with this. Agree no alcohol.

## 2020-04-10 DIAGNOSIS — E119 Type 2 diabetes mellitus without complications: Secondary | ICD-10-CM | POA: Diagnosis not present

## 2020-04-10 DIAGNOSIS — R7989 Other specified abnormal findings of blood chemistry: Secondary | ICD-10-CM | POA: Diagnosis not present

## 2020-04-10 DIAGNOSIS — F17290 Nicotine dependence, other tobacco product, uncomplicated: Secondary | ICD-10-CM | POA: Diagnosis not present

## 2020-04-10 DIAGNOSIS — E782 Mixed hyperlipidemia: Secondary | ICD-10-CM | POA: Diagnosis not present

## 2020-04-10 DIAGNOSIS — Z79899 Other long term (current) drug therapy: Secondary | ICD-10-CM | POA: Diagnosis not present

## 2020-04-10 DIAGNOSIS — R079 Chest pain, unspecified: Secondary | ICD-10-CM | POA: Diagnosis not present

## 2020-04-10 DIAGNOSIS — G4733 Obstructive sleep apnea (adult) (pediatric): Secondary | ICD-10-CM | POA: Diagnosis not present

## 2020-04-10 DIAGNOSIS — F419 Anxiety disorder, unspecified: Secondary | ICD-10-CM | POA: Diagnosis not present

## 2020-04-10 DIAGNOSIS — Z7984 Long term (current) use of oral hypoglycemic drugs: Secondary | ICD-10-CM | POA: Diagnosis not present

## 2020-04-10 DIAGNOSIS — R69 Illness, unspecified: Secondary | ICD-10-CM | POA: Diagnosis not present

## 2020-04-12 ENCOUNTER — Telehealth (HOSPITAL_COMMUNITY): Payer: Self-pay

## 2020-04-12 MED ORDER — ALPRAZOLAM 0.5 MG PO TABS: ORAL_TABLET | ORAL | 0 refills | Status: DC

## 2020-04-12 NOTE — Telephone Encounter (Signed)
sent 

## 2020-04-12 NOTE — Telephone Encounter (Signed)
Patient needs Xanax refill sent to [facility] in [place]

## 2020-04-18 ENCOUNTER — Encounter (HOSPITAL_COMMUNITY): Payer: Self-pay | Admitting: Psychiatry

## 2020-04-18 ENCOUNTER — Telehealth (INDEPENDENT_AMBULATORY_CARE_PROVIDER_SITE_OTHER): Payer: Medicare HMO | Admitting: Psychiatry

## 2020-04-18 DIAGNOSIS — F331 Major depressive disorder, recurrent, moderate: Secondary | ICD-10-CM

## 2020-04-18 DIAGNOSIS — F411 Generalized anxiety disorder: Secondary | ICD-10-CM | POA: Diagnosis not present

## 2020-04-18 DIAGNOSIS — R69 Illness, unspecified: Secondary | ICD-10-CM | POA: Diagnosis not present

## 2020-04-18 MED ORDER — VORTIOXETINE HBR 10 MG PO TABS: 10.0000 mg | ORAL_TABLET | Freq: Every day | ORAL | 0 refills | Status: DC

## 2020-04-18 NOTE — Progress Notes (Signed)
[facility] Outpatient Follow up visit  Telepsych Patient Identification: Gabriel Gilbert MRN:  [id number] Date of Evaluation:  04/18/2020 Referral Source: primary care.  Chief Complaint:    depression Visit Diagnosis:    ICD-10-CM   1. Major depressive disorder, recurrent episode, moderate (HCC)  F33.1   2. GAD (generalized anxiety disorder)  F41.1     History of Present Illness:  69  years old single white male. Referred initially for management of depression     I connected with Gabriel Gilbert on 04/18/20 at  3:00 PM EDT by telephone and verified that I am speaking with the correct person using two identifiers.   I discussed the limitations, risks, security and privacy concerns of performing an evaluation and management service by telephone and the availability of in person appointments. I also discussed with the patient that there may be a patient responsible charge related to this service. The patient expressed understanding and agreed to proceed.   Last visit has mentioned paxil helping depression. Now states was feeling low so stopped paxil , feels it was not working Other meds including effexor has been tried and he wanted to change  Mostly stays in home watches TV. Used to go [employer] but not too much  Does not want to increase paxil and wants to consider a change of med PCP has started doxepin for sleep  Continues to smoke also on xanax one a day for anxiety   Denies using alcohol or drugs Panic attacks sporadic around people. Has to take xanax for anxiety   Aggravating factor::lonliness ,modifying factor: dog Some friends.  Timing in morning   Past Psychiatric History: depression ,anxiety  Previous Psychotropic Medications: Yes   Substance Abuse History in the last 12 months:  No.  Consequences of Substance Abuse: NA  Past Medical History:  Past Medical History:  Diagnosis Date  . Bell's palsy 12-10  . Depression   . Diabetes mellitus    type 2  .  Hyperlipidemia   . Hypertension     Past Surgical History:  Procedure Laterality Date  . CATARACT EXTRACTION Left   . kidney stone removed    . pfts     normal    Family Psychiatric History: anxiety. Gabriel Gilbert used Gabriel Gilbert  Family History:  Family History  Problem Relation Age of Onset  . COPD Mother   . Hypertension Mother   . Alzheimer's disease Mother        ?  . Diabetes Father   . COPD Father   . Drug abuse Gabriel Gilbert   . COPD Gabriel Gilbert   . Cancer Gabriel Gilbert        lung    Social History:   Social History   Socioeconomic History  . Marital status: Single    Spouse name: Not on file  . Number of children: Not on file  . Years of education: Not on file  . Highest education level: Not on file  Occupational History  . Not on file  Tobacco Use  . Smoking status: Current Every Day Smoker    Packs/day: 1.00    Years: 25.00    Pack years: 25.00    Types: Cigarettes    Last attempt to quit: 02/10/2013    Years since quitting: 7.1  . Smokeless tobacco: Never Used  . Tobacco comment: using electronic cigarettes  Substance and Sexual Activity  . Alcohol use: No    Alcohol/week: 0.0 standard drinks  . Drug use: No  . Sexual  activity: Never    Partners: Female  Other Topics Concern  . Not on file  Social History Narrative  . Not on file   Social Determinants of Health   Financial Resource Strain:   . Difficulty of Paying Living Expenses:   Food Insecurity:   . Worried About Gabriel Gilbert in the Last Year:   . Barista in the Last Year:   Transportation Needs:   . Freight forwarder (Medical):   Marland Kitchen Lack of Transportation (Non-Medical):   Physical Activity:   . Days of Exercise per Week:   . Minutes of Exercise per Session:   Stress:   . Feeling of Stress :   Social Connections:   . Frequency of Communication with Friends and Family:   . Frequency of Social Gatherings with Friends and Family:   . Attends Religious Services:   . Active Member of Clubs or  Organizations:   . Attends Banker Meetings:   Marland Kitchen Marital Status:       Allergies:  No Known Allergies  Metabolic Disorder Labs: Lab Results  Component Value Date   HGBA1C 6.6 (A) 03/24/2020   No results found for: PROLACTIN Lab Results  Component Value Date   CHOL 73 08/25/2019   TRIG 145 08/25/2019   HDL 33 (L) 08/25/2019   CHOLHDL 2.2 08/25/2019   VLDL 22 01/17/2016   LDLCALC 17 08/25/2019   LDLCALC 90 05/16/2018     Current Medications: Current Outpatient Medications  Medication Sig Dispense Refill  . ALPRAZolam (XANAX) 0.5 MG tablet TAKE ONE TABLET BY MOUTH DAILY AS NEEDED FOR ANXIETY 30 tablet 0  . aspirin 81 MG tablet Take 81 mg by mouth daily.      Marland Kitchen atorvastatin (LIPITOR) 40 MG tablet Take 1 tablet (40 mg total) by mouth at bedtime. (Patient not taking: Reported on 03/24/2020) 90 tablet 2  . B-D UF III MINI PEN NEEDLES 31G X 5 MM MISC USE ONCE DAILY 100 each 10  . Doxepin HCl 3 MG TABS Take 1 tablet (3 mg total) by mouth at bedtime. 30 tablet 1  . glucose blood (ONE TOUCH TEST STRIPS) test strip Pt testing one time a day. 100 each 3  . Insulin Glargine (BASAGLAR KWIKPEN) 100 UNIT/ML INJECT 38 UNITS TOTAL INTO THE SKIN EVERY NIGHT AT BEDTIME 12 pen 2  . JARDIANCE 10 MG TABS tablet TAKE ONE TABLET BY MOUTH DAILY 30 tablet 2  . lisinopril (ZESTRIL) 20 MG tablet Take 1 tablet (20 mg total) by mouth daily. 90 tablet 1  . metFORMIN (GLUCOPHAGE) 1000 MG tablet TAKE ONE TABLET BY MOUTH TWICE A DAY WITH FOOD 180 tablet 1  . Misc. Devices MISC New CPAP machine with mask and supplies through [facility].  CPAP therapy at 14 cm. water pressure.    . ONE TOUCH ULTRA TEST test strip TEST as directed 100 each 3  . vortioxetine HBr (TRINTELLIX) 10 MG TABS tablet Take 1 tablet (10 mg total) by mouth daily. Stopped paxil 30 tablet 0   No current facility-administered medications for this visit.      Psychiatric Specialty Exam: Review of Systems   Cardiovascular: Negative for chest pain.  Skin: Negative for rash.  Psychiatric/Behavioral: Negative for suicidal ideas.    There were no vitals taken for this visit.There is no height or weight on file to calculate BMI.  General Appearance:   Eye Contact:    Speech:  Slow  Volume:  Decreased  Mood: subdued  Affect:  congruent  Thought Process:  Goal Directed  Orientation:  Full (Time, Place, and Person)  Thought Content:  Rumination  Suicidal Thoughts:  No  Homicidal Thoughts:  No  Memory:  Immediate;   Fair Recent;   Fair  Judgement:  Poor  Insight:  Shallow  Psychomotor Activity:  Decreased  Concentration:  Concentration: Fair and Attention Span: Fair  Recall:  [employer] of Knowledge:Fair  Language: Fair  Akathisia:  Negative  Handed:  Right  AIMS (if indicated):  0  Assets:  Desire for Improvement  ADL's:  Intact  Cognition: WNL  Sleep:  fair    Treatment Plan Summary: Medication management and Plan as follows   1. Major depression moderate recurrent: subdued, feels lonely, highly recommend therapy for depression and how to deal with lonliness. Does not want to be on paxil, will start brintellix 10mg .. says will see if he can afford it. 2.GAD: start Brinellix , schedule therapy, continue xanax small dose  Work on coping skills and to add activities to combat depression. Not suicidal  3. Panic attacks:sporadic, on xanax, avoid nicotine 4. Nicotine dependence: now taking e cig. Working on cutting down  I discussed the assessment and treatment plan with the patient. The patient was provided an opportunity to ask questions and all were answered. The patient agreed with the plan and demonstrated an understanding of the instructions.   The patient was advised to call back or seek an in-person evaluation if the symptoms worsen or if the condition fails to improve as anticipated.  I provided 15 minutes of non-face-to-face time during this encounter. Fu 81m.   0m, MD 6/7/20213:13 PM

## 2020-04-27 ENCOUNTER — Other Ambulatory Visit: Payer: Self-pay | Admitting: Family Medicine

## 2020-04-27 ENCOUNTER — Other Ambulatory Visit: Payer: Self-pay | Admitting: *Deleted

## 2020-04-27 MED ORDER — EMPAGLIFLOZIN 10 MG PO TABS: 10.0000 mg | ORAL_TABLET | Freq: Every day | ORAL | 2 refills | Status: DC

## 2020-05-09 ENCOUNTER — Telehealth (HOSPITAL_COMMUNITY): Payer: Self-pay | Admitting: Psychiatry

## 2020-05-09 MED ORDER — ALPRAZOLAM 0.5 MG PO TABS: ORAL_TABLET | ORAL | 0 refills | Status: DC

## 2020-05-09 NOTE — Telephone Encounter (Signed)
Pt requesting refill on xanax [employer]

## 2020-05-09 NOTE — Telephone Encounter (Signed)
sent 

## 2020-05-23 ENCOUNTER — Ambulatory Visit (HOSPITAL_COMMUNITY): Payer: Medicare HMO | Admitting: Licensed Clinical Social Worker

## 2020-05-27 ENCOUNTER — Other Ambulatory Visit (HOSPITAL_COMMUNITY): Payer: Self-pay

## 2020-05-27 MED ORDER — VORTIOXETINE HBR 10 MG PO TABS: 10.0000 mg | ORAL_TABLET | Freq: Every day | ORAL | 0 refills | Status: DC

## 2020-05-30 ENCOUNTER — Telehealth (HOSPITAL_COMMUNITY): Payer: Self-pay

## 2020-05-30 MED ORDER — PAROXETINE HCL 20 MG PO TABS: 20.0000 mg | ORAL_TABLET | Freq: Every day | ORAL | 1 refills | Status: DC

## 2020-05-30 NOTE — Telephone Encounter (Addendum)
Sent Paxil in per Dr. Gilmore Laroche Informed patient

## 2020-05-30 NOTE — Telephone Encounter (Signed)
You can send last paxil dose if he needs or he may have some at home Can stop trintellix

## 2020-05-30 NOTE — Telephone Encounter (Signed)
Patient says he wants to go back on Paxil because he can not afford the Trintellix. Please advise

## 2020-06-08 ENCOUNTER — Telehealth (HOSPITAL_COMMUNITY): Payer: Self-pay

## 2020-06-08 ENCOUNTER — Other Ambulatory Visit (HOSPITAL_COMMUNITY): Payer: Self-pay | Admitting: Psychiatry

## 2020-06-08 MED ORDER — ALPRAZOLAM 0.5 MG PO TABS: ORAL_TABLET | ORAL | 0 refills | Status: DC

## 2020-06-08 NOTE — Telephone Encounter (Signed)
sent 

## 2020-06-08 NOTE — Telephone Encounter (Signed)
This is a Dr. Gilmore Laroche pt. Do you mind sending in a refill for his Xanax to [facility] in [place] please?

## 2020-06-24 ENCOUNTER — Other Ambulatory Visit: Payer: Self-pay

## 2020-06-24 ENCOUNTER — Other Ambulatory Visit: Payer: Self-pay | Admitting: Family Medicine

## 2020-06-24 ENCOUNTER — Ambulatory Visit: Payer: Medicare HMO | Admitting: Family Medicine

## 2020-06-24 ENCOUNTER — Ambulatory Visit (INDEPENDENT_AMBULATORY_CARE_PROVIDER_SITE_OTHER): Payer: Medicare HMO | Admitting: Licensed Clinical Social Worker

## 2020-06-24 DIAGNOSIS — R69 Illness, unspecified: Secondary | ICD-10-CM | POA: Diagnosis not present

## 2020-06-24 DIAGNOSIS — F411 Generalized anxiety disorder: Secondary | ICD-10-CM | POA: Diagnosis not present

## 2020-06-24 DIAGNOSIS — G4700 Insomnia, unspecified: Secondary | ICD-10-CM

## 2020-06-25 NOTE — Progress Notes (Signed)
Comprehensive Clinical Assessment (CCA) Note  06/25/2020 BO TEICHER [id number]  Visit Diagnosis:      ICD-10-CM   1. GAD (generalized anxiety disorder)  F41.1        CCA Biopsychosocial  Intake/Chief Complaint:  CCA Intake With Chief Complaint CCA Part Two Date: 06/24/20 CCA Part Two Time: 1105 Chief Complaint/Presenting Problem: Anxiety, Mood Patient's Currently Reported Symptoms/Problems: Mood: isolates, doesn't do anything, mild irritability, low energy, low motivation, doesn't pick up around the house much, feels lonely, difficulty staying asleep, difficulty with concentration, weightloss,   Anxiety: overthinks, fear of death, worried, nervous, fearful, feels a little looked at and judged in public, history of panic attacks in the past but no longer, health anxiety, fear of leaving the house Individual's Strengths: can wood work [employer] Preferences: deosn't prefer loud people, prefers to be around people, doesn't prefer being by himself Individual's Abilities: wood working, good at solving problems with mechanical issues Type of Services Patient Feels Are Needed: Therapy and medication management Initial Clinical Notes/Concerns: Symptoms started in childhood but increased in his 20's when he started to worry about his mortality, symptoms occur 4 to 7 days a week, symptoms are severe per patient  Mental Health Symptoms Depression:  Depression: Worthlessness, Increase/decrease in appetite, Fatigue, Irritability, Sleep (too much or little), Change in energy/activity, Difficulty Concentrating, Weight gain/loss, Duration of symptoms greater than two weeks  Mania:  Mania: N/A  Anxiety:   Anxiety: Worrying, Tension, Irritability, Fatigue, Sleep, Difficulty concentrating  Psychosis:   N/A  Trauma:  Trauma: N/A  Obsessions:  Obsessions: N/A  Compulsions:  Compulsions: N/A  Inattention:  Inattention: N/A  Hyperactivity/Impulsivity:  Hyperactivity/Impulsivity: N/A   Oppositional/Defiant Behaviors:  Oppositional/Defiant Behaviors: N/A  Emotional Irregularity:  Emotional Irregularity: None  Other Mood/Personality Symptoms:  Other Mood/Personality Symptoms: N/A   Mental Status Exam Appearance and self-care  Stature:  Stature: Small  Weight:  Weight: Average weight  Clothing:  Clothing: Casual  Grooming:  Grooming: Normal  Cosmetic use:  Cosmetic Use: None  Posture/gait:  Posture/Gait: Normal  Motor activity:  Motor Activity: Slowed  Sensorium  Attention:  Attention: Normal  Concentration:  Concentration: Normal  Orientation:  Orientation: X5  Recall/memory:  Recall/Memory: Defective in Remote  Affect and Mood  Affect:  Affect: Depressed  Mood:  Mood: Anxious, Depressed  Relating  Eye contact:  Eye Contact: Normal  Facial expression:  Facial Expression: Constricted  Attitude toward examiner:  Attitude Toward Examiner: Cooperative  Thought and Language  Speech flow: Speech Flow: Soft  Thought content:  Thought Content: Appropriate to Mood and Circumstances  Preoccupation:  Preoccupations: None  Hallucinations:  Hallucinations: None  Organization:   Logical  Company secretary of Knowledge:  Fund of Knowledge: Average  Intelligence:  Intelligence: Average  Abstraction:  Abstraction: Normal  Judgement:  Judgement: Normal  Reality Testing:  Reality Testing: Adequate  Insight:  Insight: Fair  Decision Making:  Decision Making: Normal (Reports he overanalyzes)  Social Functioning  Social Maturity:  Social Maturity: Responsible  Social Judgement:  Social Judgement: Normal  Stress  Stressors:  Stressors: Relationship  Coping Ability:  Coping Ability: Building surveyor Deficits:  Skill Deficits: Activities of daily living, Interpersonal, Self-care  Supports:  Supports: Friends/Service system     Religion: Religion/Spirituality Are You A Religious Person?: No How Might This Affect Treatment?: No  impact  Leisure/Recreation: Leisure / Recreation Do You Have Hobbies?: Yes Leisure and Hobbies: watch tv  Exercise/Diet: Exercise/Diet Do You Exercise?: Yes What Type of Exercise  Do You Do?: Run/Walk How Many Times a Week Do You Exercise?: 1-3 times a week Have You Gained or Lost A Significant Amount of Weight in the Past Six Months?: Yes-Lost Number of Pounds Lost?: 20 Do You Follow a Special Diet?: No Do You Have Any Trouble Sleeping?: Yes Explanation of Sleeping Difficulties: Trouble staying asleep,  wakes up during the night   CCA Employment/Education  Employment/Work Situation: Employment / Work Situation Employment situation: Retired Therapist, 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, therapeutic Has patient ever been in the [employer]?: No  Education: Education Is Patient Currently Attending School?: No Last Grade Completed: 12 Name of High School: [employer] Did Garment/textile technologist From [employer]?: Yes Did Theme park manager?: Yes (Took several classes) What Type of College Degree Do you Have?: N/A Did You Attend Graduate School?: No What Was Your Major?: N/A Did You Have Any Special Interests In School?: N/A Did You Have An Individualized Education Program (IIEP):  (Got help with reading in Elementary school) Did You Have Any Difficulty At School?: Yes Were Any Medications Ever Prescribed For These Difficulties?: No Patient's Education Has Been Impacted by Current Illness: No   CCA Family/Childhood History  Family and Relationship History: Family history Marital status: Divorced Divorced, when?: 1984 What types of issues is patient dealing with in the relationship?: None Additional relationship information: None Are you sexually active?: No What is your sexual orientation?: Heterosexual Has your sexual activity been affected by drugs, alcohol, medication, or emotional stress?: No desire Does  patient have children?: Yes How many children?: 1 How is patient's relationship with their children?: Son, strained relationships  Childhood History:  Childhood History By whom was/is the patient raised?: Both parents Additional childhood history information: Mother and father. Father was Hotel manager. Patient describes childhood as "normal." Description of patient's relationship with caregiver when they were a child: Mother: close, Father: pretty good Patient's description of current relationship with people who raised him/her: Mother: deceased, Father: deceased How were you disciplined when you got in trouble as a child/adolescent?: spanked/paddled, Does patient have siblings?: Yes Number of Siblings: 1 Description of patient's current relationship with siblings: Sister, deceased Did patient suffer any verbal/emotional/physical/sexual abuse as a child?: No Did patient suffer from severe childhood neglect?: No Has patient ever been sexually abused/assaulted/raped as an adolescent or adult?: No Was the patient ever a victim of a crime or a disaster?: No Witnessed domestic violence?: No Has patient been affected by domestic violence as an adult?: No  Child/Adolescent Assessment:     CCA Substance Use  Alcohol/Drug Use: Alcohol / Drug Use Pain Medications: See patient MAR Prescriptions: See patient MAR Over the Counter: See patient MAR History of alcohol / drug use?: Yes Substance #1 Name of Substance 1: Alcohol 1 - Age of First Use: 21 1 - Amount (size/oz): about 3 shots 1 - Frequency: daily 1 - Duration: 2 weeks 1 - Last Use / Amount: this morning                       ASAM's:  Six Dimensions of Multidimensional Assessment  Dimension 1:  Acute Intoxication and/or Withdrawal Potential:   Dimension 1:  Description of individual's past and current experiences of substance use and withdrawal: NOne  Dimension 2:  Biomedical Conditions and Complications:   Dimension 2:   Description of patient's biomedical conditions and  complications: None  Dimension 3:  Emotional, Behavioral, or Cognitive Conditions and Complications:  Dimension 3:  Description of emotional, behavioral, or cognitive conditions and complications: None  Dimension 4:  Readiness to Change:  Dimension 4:  Description of Readiness to Change criteria: None  Dimension 5:  Relapse, Continued use, or Continued Problem Potential:  Dimension 5:  Relapse, continued use, or continued problem potential critiera description: none  Dimension 6:  Recovery/Living Environment:  Dimension 6:  Recovery/Iiving environment criteria description: None  ASAM Severity Score: ASAM's Severity Rating Score: 0  ASAM Recommended Level of Treatment:     Substance use Disorder (SUD)    Recommendations for Services/Supports/Treatments: Recommendations for Services/Supports/Treatments Recommendations For Services/Supports/Treatments: Individual Therapy, Medication Management  DSM5 Diagnoses: Patient Active Problem List   Diagnosis Date Noted  . Insomnia 03/04/2020  . Pulmonary nodule, left 04/24/2019  . Urinary hesitancy 05/16/2018  . Nicotine dependence, uncomplicated 01/01/2016  . Hydronephrosis with urinary obstruction due to ureteral calculus 01/01/2016  . Benign non-nodular prostatic hyperplasia with lower urinary tract symptoms 01/01/2016  . Left ureteral stone 01/01/2016  . Left cervical radiculopathy 04/28/2015  . Chondromalacia of right patellofemoral joint 04/28/2015  . OSA on CPAP 06/26/2013  . Major depressive disorder, recurrent episode, severe, without mention of psychotic behavior 04/21/2013  . Generalized anxiety disorder 04/21/2013  . Obesity 02/14/2012  . RLS (restless legs syndrome) 03/12/2011  . DERMATITIS, HANDS 08/04/2010  . BELL'S PALSY 10/26/2009  . CORTICAL SENILE CATARACT 05/09/2009  . LUMBAR RADICULOPATHY 11/26/2008  . Type 2 diabetes mellitus with periodontal complication (HCC)  03/13/2007  . INCONTINENCE, URGE 03/13/2007  . DEPRESSION, MAJOR, RECURRENT 08/20/2006  . TOBACCO DEPENDENCE 08/20/2006  . Essential hypertension, benign 08/20/2006  . KIDNEY STONE - NEPHROLITHIASIS 08/20/2006  . SLEEP APNEA 08/20/2006    Patient Centered Plan: Patient is on the following Treatment Plan(s):  Anxiety   Referrals to Alternative Service(s): Referred to Alternative Service(s):   Place:   Date:   Time:    Referred to Alternative Service(s):   Place:   Date:   Time:    Referred to Alternative Service(s):   Place:   Date:   Time:    Referred to Alternative Service(s):   Place:   Date:   Time:     Bynum Bellows, LCSW

## 2020-06-30 ENCOUNTER — Ambulatory Visit (INDEPENDENT_AMBULATORY_CARE_PROVIDER_SITE_OTHER): Payer: Medicare HMO | Admitting: Family Medicine

## 2020-06-30 ENCOUNTER — Encounter: Payer: Self-pay | Admitting: Family Medicine

## 2020-06-30 VITALS — BP 157/73 | HR 101 | Ht 66.0 in | Wt 172.0 lb

## 2020-06-30 DIAGNOSIS — G4700 Insomnia, unspecified: Secondary | ICD-10-CM

## 2020-06-30 DIAGNOSIS — Z9989 Dependence on other enabling machines and devices: Secondary | ICD-10-CM

## 2020-06-30 DIAGNOSIS — R69 Illness, unspecified: Secondary | ICD-10-CM | POA: Diagnosis not present

## 2020-06-30 DIAGNOSIS — F411 Generalized anxiety disorder: Secondary | ICD-10-CM | POA: Diagnosis not present

## 2020-06-30 DIAGNOSIS — G4733 Obstructive sleep apnea (adult) (pediatric): Secondary | ICD-10-CM | POA: Diagnosis not present

## 2020-06-30 MED ORDER — HYDROXYZINE PAMOATE 25 MG PO CAPS: 25.0000 mg | ORAL_CAPSULE | Freq: Three times a day (TID) | ORAL | 0 refills | Status: DC | PRN

## 2020-06-30 MED ORDER — TRAZODONE HCL 50 MG PO TABS: 50.0000 mg | ORAL_TABLET | Freq: Every day | ORAL | 1 refills | Status: DC

## 2020-06-30 NOTE — Assessment & Plan Note (Addendum)
Discussed options.  We will discontinue doxepin and switch back to trazodone which she is used previously we will start with 50 mg at bedtime.  I did not want to do the Remeron today.  He does take alprazolam about once a day on average.  Gust the importance of avoiding naps and try to go to bed the same time and completely avoiding alcohol.  I discussed that if the stress is part of the cause of the sleep disorder that treating the stress is most important to improve the sleep.

## 2020-06-30 NOTE — Assessment & Plan Note (Addendum)
He has had anxiety disorder lifelong but for whatever reason it is really significantly increased we discussed the importance of cutting out alcohol which can temporarily help as a depressant but then can actually worsen his symptoms overall.  Just really encouraged him to quit drinking it completely.  Also encouraged him to follow-up with his psychiatrist also feel like he would benefit significantly from some therapy to work on really coping with his anxiety and physical symptoms that he gets with his anxiety.  Says he actually just started some treatments with a new therapist but is only been for 1 session.  We will do trial of hydroxyzine but again discussed the importance of really managing his anxiety symptoms instead of relying on medication.

## 2020-06-30 NOTE — Progress Notes (Signed)
Established Patient Office Visit  Subjective:  Patient ID: Gabriel Gilbert, male    DOB: 1951/10/06  Age: 69 y.o. MRN: [id number]  CC:  Chief Complaint  Patient presents with  . Insomnia    HPI Gabriel Gilbert presents for poor sleep quality.  He says he is typically waking up about 3-4 times at night and has difficulty going back to sleep.  He feels like it is gotten significantly worse in the last week.  Though the insomnia is more chronic.  He was on trazodone at one point but says he has not been on that for a while.  Currently he is on doxepin but it sounds like it has not been helpful for him.  He actually had some old Remeron that the previous psychiatrist had given him and so he took that for a little while and says he thought it did help some.  He just really feels like his anxiety is really wrapped up.  He is not really sure why denies any new stressors or triggers.  To try to compensate he is actually been drinking more alcohol.  He feels like it does help temporarily.  He says when he first wakes up in the morning he feels so incredibly anxious that he will take a drink.  And that he is also been drinking at bedtime to help him sleep.  Feels like his anxiety is higher.  He reports that he is been frustrated that he is not getting to take more Xanax.  And so sometimes he will end up taking an extra tab or extra half a tab and then towards the end of the month is having to split the tabs in half and then he worries that he will actually run out.  Past Medical History:  Diagnosis Date  . Bell's palsy 12-10  . Depression   . Diabetes mellitus    type 2  . Hyperlipidemia   . Hypertension     Past Surgical History:  Procedure Laterality Date  . CATARACT EXTRACTION Left   . kidney stone removed    . pfts     normal    Family History  Problem Relation Age of Onset  . COPD Mother   . Hypertension Mother   . Alzheimer's disease Mother        ?  . Diabetes Father   . COPD  Father   . Drug abuse Son   . COPD Sister   . Cancer Sister        lung    Social History   Socioeconomic History  . Marital status: Single    Spouse name: Not on file  . Number of children: Not on file  . Years of education: Not on file  . Highest education level: Not on file  Occupational History  . Not on file  Tobacco Use  . Smoking status: Current Every Day Smoker    Packs/day: 1.00    Years: 25.00    Pack years: 25.00    Types: Cigarettes    Last attempt to quit: 02/10/2013    Years since quitting: 7.3  . Smokeless tobacco: Never Used  . Tobacco comment: using electronic cigarettes  Vaping Use  . Vaping Use: Every day  Substance and Sexual Activity  . Alcohol use: No    Alcohol/week: 0.0 standard drinks  . Drug use: No  . Sexual activity: Never    Partners: Female  Other Topics Concern  . Not on file  Social History Narrative  . Not on file   Social Determinants of Health   Financial Resource Strain:   . Difficulty of Paying Living Expenses: Not on file  Food Insecurity:   . Worried About Programme researcher, broadcasting/film/video in the Last Year: Not on file  . Ran Out of Food in the Last Year: Not on file  Transportation Needs:   . Lack of Transportation (Medical): Not on file  . Lack of Transportation (Non-Medical): Not on file  Physical Activity:   . Days of Exercise per Week: Not on file  . Minutes of Exercise per Session: Not on file  Stress:   . Feeling of Stress : Not on file  Social Connections:   . Frequency of Communication with Friends and Family: Not on file  . Frequency of Social Gatherings with Friends and Family: Not on file  . Attends Religious Services: Not on file  . Active Member of Clubs or Organizations: Not on file  . Attends Banker Meetings: Not on file  . Marital Status: Not on file  Intimate Partner Violence:   . Fear of Current or Ex-Partner: Not on file  . Emotionally Abused: Not on file  . Physically Abused: Not on file  .  Sexually Abused: Not on file    Outpatient Medications Prior to Visit  Medication Sig Dispense Refill  . ALPRAZolam (XANAX) 0.5 MG tablet TAKE ONE TABLET BY MOUTH DAILY AS NEEDED FOR ANXIETY 30 tablet 0  . aspirin 81 MG tablet Take 81 mg by mouth daily.      Marland Kitchen atorvastatin (LIPITOR) 40 MG tablet Take 1 tablet (40 mg total) by mouth at bedtime. 90 tablet 2  . B-D UF III MINI PEN NEEDLES 31G X 5 MM MISC USE ONCE DAILY 100 each 9  . empagliflozin (JARDIANCE) 10 MG TABS tablet Take 1 tablet (10 mg total) by mouth daily. 30 tablet 2  . glucose blood (ONE TOUCH TEST STRIPS) test strip Pt testing one time a day. 100 each 3  . Insulin Glargine (BASAGLAR KWIKPEN) 100 UNIT/ML INJECT 38 UNITS TOTAL INTO THE SKIN EVERY NIGHT AT BEDTIME (Patient taking differently: Inject 20 Units into the skin at bedtime. ) 12 pen 2  . lisinopril (ZESTRIL) 20 MG tablet Take 1 tablet (20 mg total) by mouth daily. 90 tablet 1  . metFORMIN (GLUCOPHAGE) 1000 MG tablet TAKE ONE TABLET BY MOUTH TWICE A DAY WITH FOOD 180 tablet 1  . Misc. Devices MISC New CPAP machine with mask and supplies through [facility].  CPAP therapy at 14 cm. water pressure.    . ONE TOUCH ULTRA TEST test strip TEST as directed 100 each 3  . Doxepin HCl 3 MG TABS Take 1 tablet (3 mg total) by mouth at bedtime. 30 tablet 1  . PARoxetine (PAXIL) 20 MG tablet Take 1 tablet (20 mg total) by mouth daily. 30 tablet 1  . venlafaxine (EFFEXOR) 37.5 MG tablet TAKE ONE TABLET BY MOUTH DAILY 30 tablet 0   No facility-administered medications prior to visit.    No Known Allergies  ROS Review of Systems    Objective:    Physical Exam Vitals reviewed.  Constitutional:      Appearance: He is well-developed.  HENT:     Head: Normocephalic and atraumatic.  Eyes:     Conjunctiva/sclera: Conjunctivae normal.  Cardiovascular:     Rate and Rhythm: Normal rate.  Pulmonary:     Effort: Pulmonary effort is normal.  Skin:    General: Skin is dry.      Coloration: Skin is not pale.  Neurological:     Mental Status: He is alert and oriented to person, place, and time.  Psychiatric:        Behavior: Behavior normal.     BP (!) 157/73   Pulse (!) 101   Ht 5\' 6"  (1.676 m)   Wt 172 lb (78 kg)   SpO2 96%   BMI 27.76 kg/m  Wt Readings from Last 3 Encounters:  06/30/20 172 lb (78 kg)  03/24/20 175 lb (79.4 kg)  03/04/20 174 lb 11.2 oz (79.2 kg)     There are no preventive care reminders to display for this patient.  There are no preventive care reminders to display for this patient.  Lab Results  Component Value Date   TSH 1.580 01/25/2015   Lab Results  Component Value Date   WBC 15.2 (H) 07/10/2019   HGB 16.5 07/10/2019   HCT 49.5 07/10/2019   MCV 93.9 07/10/2019   PLT 333 07/10/2019   Lab Results  Component Value Date   NA 139 03/25/2020   K 4.5 03/25/2020   CO2 29 03/25/2020   GLUCOSE 100 (H) 03/25/2020   BUN 18 03/25/2020   CREATININE 0.84 03/25/2020   BILITOT 0.5 08/25/2019   ALKPHOS 61 06/06/2017   AST 12 08/25/2019   ALT 6 (L) 08/25/2019   PROT 6.4 08/25/2019   ALBUMIN 4.1 06/06/2017   CALCIUM 9.7 03/25/2020   Lab Results  Component Value Date   CHOL 73 08/25/2019   Lab Results  Component Value Date   HDL 33 (L) 08/25/2019   Lab Results  Component Value Date   LDLCALC 17 08/25/2019   Lab Results  Component Value Date   TRIG 145 08/25/2019   Lab Results  Component Value Date   CHOLHDL 2.2 08/25/2019   Lab Results  Component Value Date   HGBA1C 6.6 (A) 03/24/2020      Assessment & Plan:   Problem List Items Addressed This Visit      Respiratory   OSA on CPAP - Primary     Other   Insomnia    Discussed options.  We will discontinue doxepin and switch back to trazodone which she is used previously we will start with 50 mg at bedtime.  I did not want to do the Remeron today.  He does take alprazolam about once a day on average.  Gust the importance of avoiding naps and try to go  to bed the same time and completely avoiding alcohol.  I discussed that if the stress is part of the cause of the sleep disorder that treating the stress is most important to improve the sleep.      Relevant Medications   traZODone (DESYREL) 50 MG tablet   Generalized anxiety disorder    He has had anxiety disorder lifelong but for whatever reason it is really significantly increased we discussed the importance of cutting out alcohol which can temporarily help as a depressant but then can actually worsen his symptoms overall.  Just really encouraged him to quit drinking it completely.  Also encouraged him to follow-up with his psychiatrist also feel like he would benefit significantly from some therapy to work on really coping with his anxiety and physical symptoms that he gets with his anxiety.  Says he actually just started some treatments with a new therapist but is only been for 1 session.  We will  do trial of hydroxyzine but again discussed the importance of really managing his anxiety symptoms instead of relying on medication.      Relevant Medications   traZODone (DESYREL) 50 MG tablet   hydrOXYzine (VISTARIL) 25 MG capsule      Meds ordered this encounter  Medications  . traZODone (DESYREL) 50 MG tablet    Sig: Take 1 tablet (50 mg total) by mouth at bedtime.    Dispense:  90 tablet    Refill:  1  . hydrOXYzine (VISTARIL) 25 MG capsule    Sig: Take 1 capsule (25 mg total) by mouth 3 (three) times daily as needed.    Dispense:  30 capsule    Refill:  0    Follow-up: Return if symptoms worsen or fail to improve.  Keep follow-up in 1 week for diabetes and blood pressure.  He feels his blood pressure is high today because he is feeling very anxious.  Spent 25 minutes in encounter.  Gabriel Gasser, MD

## 2020-06-30 NOTE — Patient Instructions (Signed)
Please stop you Doxepin and start the trazodone instead.

## 2020-07-08 ENCOUNTER — Telehealth (HOSPITAL_COMMUNITY): Payer: Self-pay

## 2020-07-08 ENCOUNTER — Ambulatory Visit (INDEPENDENT_AMBULATORY_CARE_PROVIDER_SITE_OTHER): Payer: Medicare HMO | Admitting: Family Medicine

## 2020-07-08 ENCOUNTER — Encounter: Payer: Self-pay | Admitting: Family Medicine

## 2020-07-08 VITALS — BP 124/57 | HR 76 | Ht 66.0 in | Wt 177.0 lb

## 2020-07-08 DIAGNOSIS — I10 Essential (primary) hypertension: Secondary | ICD-10-CM

## 2020-07-08 DIAGNOSIS — E11630 Type 2 diabetes mellitus with periodontal disease: Secondary | ICD-10-CM | POA: Diagnosis not present

## 2020-07-08 DIAGNOSIS — Z794 Long term (current) use of insulin: Secondary | ICD-10-CM | POA: Diagnosis not present

## 2020-07-08 LAB — POCT GLYCOSYLATED HEMOGLOBIN (HGB A1C): Hemoglobin A1C: 6.1 % — AB (ref 4.0–5.6)

## 2020-07-08 MED ORDER — ALPRAZOLAM 0.5 MG PO TABS: ORAL_TABLET | ORAL | 0 refills | Status: DC

## 2020-07-08 MED ORDER — METFORMIN HCL 1000 MG PO TABS: 1000.0000 mg | ORAL_TABLET | Freq: Two times a day (BID) | ORAL | 1 refills | Status: DC

## 2020-07-08 MED ORDER — EMPAGLIFLOZIN 10 MG PO TABS: 10.0000 mg | ORAL_TABLET | Freq: Every day | ORAL | 1 refills | Status: DC

## 2020-07-08 NOTE — Telephone Encounter (Signed)
sent 

## 2020-07-08 NOTE — Assessment & Plan Note (Signed)
Doing great!! A1C down to 6.1. on 20 units for the last 2 months.  OK to dec down to 15 units. He doesn't usually  Check his glucose so I did ask him to check occ to make sure not having any hypoglycemia great job with weight loss.

## 2020-07-08 NOTE — Progress Notes (Signed)
Established Patient Office Visit  Subjective:  Patient ID: Gabriel Gilbert, male    DOB: [birth year]  Age: 69 y.o. MRN: [id number]  CC:  Chief Complaint  Patient presents with  . Diabetes    HPI Gabriel Gilbert presents for Diabetes - no hypoglycemic events. No wounds or sores that are not healing well. No increased thirst or urination. Checking glucose at home. Taking medications as prescribed without any side effects. He is using 20 units of insulin.   Decided to become vegetarian about 3-4 months ago.  Doesn't eat fish wither.  He has been losing weight with the diet change. Though sometime only eats once a day.   Past Medical History:  Diagnosis Date  . Bell's palsy 12-10  . Depression   . Diabetes mellitus    type 2  . Hyperlipidemia   . Hypertension     Past Surgical History:  Procedure Laterality Date  . CATARACT EXTRACTION Left   . kidney stone removed    . pfts     normal    Family History  Problem Relation Age of Onset  . COPD Mother   . Hypertension Mother   . Alzheimer's disease Mother        ?  . Diabetes Father   . COPD Father   . Drug abuse Son   . COPD Sister   . Cancer Sister        lung    Social History   Socioeconomic History  . Marital status: Single    Spouse name: Not on file  . Number of children: Not on file  . Years of education: Not on file  . Highest education level: Not on file  Occupational History  . Not on file  Tobacco Use  . Smoking status: Current Every Day Smoker    Packs/day: 1.00    Years: 25.00    Pack years: 25.00    Types: Cigarettes    Last attempt to quit: 02/10/2013    Years since quitting: 7.4  . Smokeless tobacco: Never Used  . Tobacco comment: using electronic cigarettes  Vaping Use  . Vaping Use: Every day  Substance and Sexual Activity  . Alcohol use: No    Alcohol/week: 0.0 standard drinks  . Drug use: No  . Sexual activity: Never    Partners: Female  Other Topics Concern  . Not on file   Social History Narrative  . Not on file   Social Determinants of Health   Financial Resource Strain:   . Difficulty of Paying Living Expenses: Not on file  Food Insecurity:   . Worried About Programme researcher, broadcasting/film/video in the Last Year: Not on file  . Ran Out of Food in the Last Year: Not on file  Transportation Needs:   . Lack of Transportation (Medical): Not on file  . Lack of Transportation (Non-Medical): Not on file  Physical Activity:   . Days of Exercise per Week: Not on file  . Minutes of Exercise per Session: Not on file  Stress:   . Feeling of Stress : Not on file  Social Connections:   . Frequency of Communication with Friends and Family: Not on file  . Frequency of Social Gatherings with Friends and Family: Not on file  . Attends Religious Services: Not on file  . Active Member of Clubs or Organizations: Not on file  . Attends Banker Meetings: Not on file  . Marital Status: Not on file  Intimate Partner Violence:   . Fear of Current or Ex-Partner: Not on file  . Emotionally Abused: Not on file  . Physically Abused: Not on file  . Sexually Abused: Not on file    Outpatient Medications Prior to Visit  Medication Sig Dispense Refill  . ALPRAZolam (XANAX) 0.5 MG tablet TAKE ONE TABLET BY MOUTH DAILY AS NEEDED FOR ANXIETY 30 tablet 0  . aspirin 81 MG tablet Take 81 mg by mouth daily.      Marland Kitchen atorvastatin (LIPITOR) 40 MG tablet Take 1 tablet (40 mg total) by mouth at bedtime. 90 tablet 2  . B-D UF III MINI PEN NEEDLES 31G X 5 MM MISC USE ONCE DAILY 100 each 9  . glucose blood (ONE TOUCH TEST STRIPS) test strip Pt testing one time a day. 100 each 3  . hydrOXYzine (VISTARIL) 25 MG capsule Take 1 capsule (25 mg total) by mouth 3 (three) times daily as needed. 30 capsule 0  . Insulin Glargine (BASAGLAR KWIKPEN) 100 UNIT/ML INJECT 38 UNITS TOTAL INTO THE SKIN EVERY NIGHT AT BEDTIME (Patient taking differently: Inject 20 Units into the skin at bedtime. ) 12 pen 2  .  lisinopril (ZESTRIL) 20 MG tablet Take 1 tablet (20 mg total) by mouth daily. 90 tablet 1  . Misc. Devices MISC New CPAP machine with mask and supplies through [facility].  CPAP therapy at 14 cm. water pressure.    . ONE TOUCH ULTRA TEST test strip TEST as directed 100 each 3  . PARoxetine (PAXIL) 20 MG tablet Take 1 tablet (20 mg total) by mouth daily. 30 tablet 1  . traZODone (DESYREL) 50 MG tablet Take 1 tablet (50 mg total) by mouth at bedtime. 90 tablet 1  . empagliflozin (JARDIANCE) 10 MG TABS tablet Take 1 tablet (10 mg total) by mouth daily. 30 tablet 2  . metFORMIN (GLUCOPHAGE) 1000 MG tablet TAKE ONE TABLET BY MOUTH TWICE A DAY WITH FOOD 180 tablet 1   No facility-administered medications prior to visit.    No Known Allergies  ROS Review of Systems    Objective:    Physical Exam Constitutional:      Appearance: He is well-developed.  HENT:     Head: Normocephalic and atraumatic.     Comments: Mild swelling of the left upper eyelid and some bagging skin prominence underneath.  Cardiovascular:     Rate and Rhythm: Normal rate and regular rhythm.     Heart sounds: Normal heart sounds.  Pulmonary:     Effort: Pulmonary effort is normal.     Breath sounds: Normal breath sounds.  Skin:    General: Skin is warm and dry.  Neurological:     Mental Status: He is alert and oriented to person, place, and time.  Psychiatric:        Behavior: Behavior normal.     BP (!) 124/57   Pulse 76   Ht 5\' 6"  (1.676 m)   Wt 177 lb (80.3 kg)   SpO2 97%   BMI 28.57 kg/m  Wt Readings from Last 3 Encounters:  07/08/20 177 lb (80.3 kg)  06/30/20 172 lb (78 kg)  03/24/20 175 lb (79.4 kg)     There are no preventive care reminders to display for this patient.  There are no preventive care reminders to display for this patient.  Lab Results  Component Value Date   TSH 1.580 01/25/2015   Lab Results  Component Value Date   WBC 15.2 (H)  07/10/2019   HGB 16.5 07/10/2019    HCT 49.5 07/10/2019   MCV 93.9 07/10/2019   PLT 333 07/10/2019   Lab Results  Component Value Date   NA 139 03/25/2020   K 4.5 03/25/2020   CO2 29 03/25/2020   GLUCOSE 100 (H) 03/25/2020   BUN 18 03/25/2020   CREATININE 0.84 03/25/2020   BILITOT 0.5 08/25/2019   ALKPHOS 61 06/06/2017   AST 12 08/25/2019   ALT 6 (L) 08/25/2019   PROT 6.4 08/25/2019   ALBUMIN 4.1 06/06/2017   CALCIUM 9.7 03/25/2020   Lab Results  Component Value Date   CHOL 73 08/25/2019   Lab Results  Component Value Date   HDL 33 (L) 08/25/2019   Lab Results  Component Value Date   LDLCALC 17 08/25/2019   Lab Results  Component Value Date   TRIG 145 08/25/2019   Lab Results  Component Value Date   CHOLHDL 2.2 08/25/2019   Lab Results  Component Value Date   HGBA1C 6.1 (A) 07/08/2020      Assessment & Plan:   Problem List Items Addressed This Visit      Cardiovascular and Mediastinum   Essential hypertension, benign    Well controlled. Continue current regimen. Follow up in  3 months.          Endocrine   Type 2 diabetes mellitus with periodontal complication (HCC) - Primary    Doing great!! A1C down to 6.1. on 20 units for the last 2 months.  OK to dec down to 15 units. He doesn't usually  Check his glucose so I did ask him to check occ to make sure not having any hypoglycemia great job with weight loss.       Relevant Medications   empagliflozin (JARDIANCE) 10 MG TABS tablet   metFORMIN (GLUCOPHAGE) 1000 MG tablet   Other Relevant Orders   POCT glycosylated hemoglobin (Hb A1C) (Completed)      Meds ordered this encounter  Medications  . empagliflozin (JARDIANCE) 10 MG TABS tablet    Sig: Take 1 tablet (10 mg total) by mouth daily.    Dispense:  90 tablet    Refill:  1  . metFORMIN (GLUCOPHAGE) 1000 MG tablet    Sig: Take 1 tablet (1,000 mg total) by mouth 2 (two) times daily with a meal.    Dispense:  180 tablet    Refill:  1    Follow-up: Return in about 3 months  (around 10/08/2020) for Diabetes follow-up.    Nani Gasser, MD

## 2020-07-08 NOTE — Telephone Encounter (Signed)
Patient needs a refill on Alprazolam sent to [facility] in [place] 

## 2020-07-08 NOTE — Patient Instructions (Signed)
Decrease Basaglar to 15 units a day.  Please call if you start having low blood sugars less than 80.

## 2020-07-08 NOTE — Assessment & Plan Note (Signed)
Well controlled. Continue current regimen. Follow up in  3 months.  

## 2020-07-18 ENCOUNTER — Other Ambulatory Visit: Payer: Self-pay | Admitting: Family Medicine

## 2020-07-22 ENCOUNTER — Other Ambulatory Visit: Payer: Self-pay

## 2020-07-22 MED ORDER — HYDROXYZINE PAMOATE 25 MG PO CAPS: 25.0000 mg | ORAL_CAPSULE | Freq: Three times a day (TID) | ORAL | 1 refills | Status: DC | PRN

## 2020-07-25 ENCOUNTER — Ambulatory Visit (INDEPENDENT_AMBULATORY_CARE_PROVIDER_SITE_OTHER): Payer: Medicare HMO | Admitting: Licensed Clinical Social Worker

## 2020-07-25 DIAGNOSIS — F411 Generalized anxiety disorder: Secondary | ICD-10-CM | POA: Diagnosis not present

## 2020-07-25 DIAGNOSIS — R69 Illness, unspecified: Secondary | ICD-10-CM | POA: Diagnosis not present

## 2020-07-26 NOTE — Progress Notes (Signed)
° °  THERAPIST PROGRESS NOTE  Session Time: 1:00 pm-1:45 pm  Participation Level: Active  Behavioral Response: CasualAlertAnxious  Type of Therapy: Individual Therapy  Treatment Goals addressed: Coping  Interventions: CBT and Solution Focused  Case Summary: Gabriel Gilbert is a 69 y.o. male who presents oriented x5 (person, place, situation, time, and object), casually dressed, appropriately groomed, average height, average weight, and cooperative to address anxiety. Patient has a history of medical treatment including hypertension, sleep apnea, and cataract. Patient has a history of mental health treatment including outpatient therapy, and medication management. Patient denies suicidal and homicidal ideations. Patient denies substance abuse. Patient is at low risk for lethality.  Session #1  Physically: Patient is doing ok physically. He has difficulty with sleep and pain.  Spiritually/values: No issues identified. Relationships: Patient has been spending time with his male friend. They get along well. Patient goes to [employer] to have coffee with several older men. Patient is ok with the conversations but doesn't feel like they are on the same level. It is not very mentally stimulating. Patient said that one of the men told him he smells. Patient admitted that his dog does smell and it gets on his clothes at times. Patient has felt self conscious about going due to this but still goes to have conversation with these guys. Emotionally/Mentally/Behavior: Patient has had moments of anxiety. Patient worries about the future and worries about his mortality. Patient has thought about his mortality since his 20's since he read an article about guys who blew themselves up with fireworks. Patient is experiences anticipation anxiety. He agreed to pay attention to his thoughts that trigger anxiety and depression.   Suicidal/Homicidal: Negativewithout intent/plan  Therapist Response: Therapist  reviewed patient's recent thoughts and behaviors. Therapist utilized CBT to address anxiety. Therapist reviewed patient's feelings to identify triggers for anxiety. Therapist assisted patient in identifying anxiety and his relationships.   Plan: Return again in 1 weeks.  Diagnosis: Axis I: Generalized Anxiety Disorder    Axis II: No diagnosis    Bynum Bellows, LCSW 07/26/2020

## 2020-08-01 ENCOUNTER — Ambulatory Visit (INDEPENDENT_AMBULATORY_CARE_PROVIDER_SITE_OTHER): Payer: Medicare HMO | Admitting: Licensed Clinical Social Worker

## 2020-08-01 DIAGNOSIS — F411 Generalized anxiety disorder: Secondary | ICD-10-CM | POA: Diagnosis not present

## 2020-08-01 DIAGNOSIS — R69 Illness, unspecified: Secondary | ICD-10-CM | POA: Diagnosis not present

## 2020-08-01 NOTE — Progress Notes (Signed)
   THERAPIST PROGRESS NOTE  Session Time: 1:00 pm-1:45 pm  Participation Level: Active  Behavioral Response: CasualAlertAnxious  Type of Therapy: Individual Therapy  Treatment Goals addressed: Coping  Interventions: CBT and Solution Focused  Case Summary: Gabriel Gilbert is a 69 y.o. male who presents oriented x5 (person, place, situation, time, and object), casually dressed, appropriately groomed, average height, average weight, and cooperative to address anxiety. Patient has a history of medical treatment including hypertension, sleep apnea, and cataract. Patient has a history of mental health treatment including outpatient therapy, and medication management. Patient denies suicidal and homicidal ideations. Patient denies substance abuse. Patient is at low risk for lethality.  Session #2  Physically: Patient has drank alcohol to manage his anxiety. Patient is not very active and stays in the home a lot.  Spiritually/values: No issues identified. Relationships: Patient doesn't feel as connected with his group that he drinks coffee with in morning. He doesn't feel like if he passed away anyone would care. He has a strained relationship with his ex wife, and his son. He does admit to having a girlfriend and another friend that he spends time with.  Emotionally/Mentally/Behavior: Patient's anxiety has increased. He has run low on xanax and worries about what would happen if he runs. Patient feels like he will have a panic attack without his xanax but is going to hang on to one tablet. He feels like his pharmacist didn't give him enough medication. Patient agreed to track his medication use on a calendar or piece of paper to identify how often he takes it/needs it and how often he is experiencing anxiety. He has been managing his anxiety with alcohol use in his morning coffee. After discussion, patient understood that he is self medicating with alcohol and needs to stop/reduce.    Suicidal/Homicidal: Negativewithout intent/plan  Therapist Response: Therapist reviewed patient's recent thoughts and behaviors. Therapist utilized CBT to address anxiety. Therapist reviewed patient's feelings to identify triggers for anxiety. Therapist assisted patient in identifying anxiety and his alcohol use.   Plan: Return again in 1 weeks.  Diagnosis: Axis I: Generalized Anxiety Disorder    Axis II: No diagnosis    Bynum Bellows, LCSW 08/01/2020

## 2020-08-02 ENCOUNTER — Other Ambulatory Visit: Payer: Self-pay | Admitting: Family Medicine

## 2020-08-08 ENCOUNTER — Other Ambulatory Visit (HOSPITAL_COMMUNITY): Payer: Self-pay | Admitting: Psychiatry

## 2020-08-08 ENCOUNTER — Ambulatory Visit (INDEPENDENT_AMBULATORY_CARE_PROVIDER_SITE_OTHER): Payer: Medicare HMO | Admitting: Licensed Clinical Social Worker

## 2020-08-08 DIAGNOSIS — F411 Generalized anxiety disorder: Secondary | ICD-10-CM

## 2020-08-08 DIAGNOSIS — R69 Illness, unspecified: Secondary | ICD-10-CM | POA: Diagnosis not present

## 2020-08-09 ENCOUNTER — Encounter (HOSPITAL_COMMUNITY): Payer: Self-pay | Admitting: Psychiatry

## 2020-08-09 ENCOUNTER — Telehealth (INDEPENDENT_AMBULATORY_CARE_PROVIDER_SITE_OTHER): Payer: Medicare HMO | Admitting: Psychiatry

## 2020-08-09 DIAGNOSIS — F411 Generalized anxiety disorder: Secondary | ICD-10-CM

## 2020-08-09 DIAGNOSIS — F17208 Nicotine dependence, unspecified, with other nicotine-induced disorders: Secondary | ICD-10-CM

## 2020-08-09 DIAGNOSIS — F331 Major depressive disorder, recurrent, moderate: Secondary | ICD-10-CM | POA: Diagnosis not present

## 2020-08-09 DIAGNOSIS — R69 Illness, unspecified: Secondary | ICD-10-CM | POA: Diagnosis not present

## 2020-08-09 MED ORDER — PAROXETINE HCL 20 MG PO TABS
20.0000 mg | ORAL_TABLET | Freq: Every day | ORAL | 1 refills | Status: DC
Start: 2020-08-09 — End: 2020-09-29

## 2020-08-09 NOTE — Progress Notes (Signed)
   THERAPIST PROGRESS NOTE  Session Time: 1:00 pm-1:30 pm  Participation Level: Active  Behavioral Response: CasualAlertAnxious  Type of Therapy: Individual Therapy  Treatment Goals addressed: Coping  Interventions: CBT and Solution Focused  Case Summary: Gabriel Gilbert is a 69 y.o. male who presents oriented x5 (person, place, situation, time, and object), casually dressed, appropriately groomed, average height, average weight, and cooperative to address anxiety. Patient has a history of medical treatment including hypertension, sleep apnea, and cataract. Patient has a history of mental health treatment including outpatient therapy, and medication management. Patient denies suicidal and homicidal ideations. Patient denies substance abuse. Patient is at low risk for lethality.  Session #3  Physically: Patient continues to drink alcohol to self medicate. Patient is down to his last xanax and is worried about having a panic attack. He has been staying inside and not doing much.  Spiritually/values: No issues identified. Relationships: Patient has stopped going to see his group in the morning to drink coffee with. He is worried about the foul smell from his dog that gets on his clothes. Patient has isolated. His girlfriend was supposed to come and clean his home but he asked her not to because he felt it would increase his anxiety.  Emotionally/Mentally/Behavior: Patient's anxiety has increased. He was irritable and frustrated by not getting enough xanax. Patient was unable to identify specific thoughts outside of not having xanax that trigger anxiety.   Suicidal/Homicidal: Negativewithout intent/plan  Therapist Response: Therapist reviewed patient's recent thoughts and behaviors. Therapist utilized CBT to address anxiety. Therapist reviewed patient's feelings to identify triggers for anxiety. Therapist discussed with patient his self medicating, isolation, and identifying his thoughts to  manage anxiety.   Plan: Return again in 1 weeks.  Diagnosis: Axis I: Generalized Anxiety Disorder    Axis II: No diagnosis    Bynum Bellows, LCSW 08/09/2020

## 2020-08-09 NOTE — Progress Notes (Signed)
[facility] Outpatient Follow up visit  Telepsych Patient Identification: Gabriel Gilbert MRN:  [id number] Date of Evaluation:  08/09/2020 Referral Source: primary care.  Chief Complaint:    depression Visit Diagnosis:    ICD-10-CM   1. GAD (generalized anxiety disorder)  F41.1   2. Major depressive disorder, recurrent episode, moderate (HCC)  F33.1   3. Nicotine dependence with other nicotine-induced disorder, unspecified nicotine product type  F17.208     History of Present Illness:  69  years old single white male. Referred initially for management of depression     I connected with Gabriel Gilbert on 08/09/20 at 10:30 AM EDT by telephone and verified that I am speaking with the correct person using two identifiers.  I discussed the limitations, risks, security and privacy concerns of performing an evaluation and management service by telephone and the availability of in person appointments. I also discussed with the patient that there may be a patient responsible charge related to this service. The patient expressed understanding and agreed to proceed.  Patient location home  Provider location : home office  Last visit wanted to change paxil, started brintellix,  He called later that cannot afford it and ok'd to go back to paxil Have recommended to continue work in therapy to deal with depression and coping skills He is doing therapy  Xanax helps prn anxiety and calls regular to refill it  Mostly stays at home ,watches tV  Denies hopelessness but endorses monotony in life     Continues to smoke also on xanax one a day for anxiety   Denies using alcohol or drugs Panic attacks sporadic around people. Has to take xanax for anxiety   Aggravating factor: lonliness ,modifying factor: dog,  Some friends.  Timing in morning   Past Psychiatric History: depression ,anxiety  Previous Psychotropic Medications: Yes   Substance Abuse History in the last 12 months:   No.  Consequences of Substance Abuse: NA  Past Medical History:  Past Medical History:  Diagnosis Date   Bell's palsy 12-10   Depression    Diabetes mellitus    type 2   Hyperlipidemia    Hypertension     Past Surgical History:  Procedure Laterality Date   CATARACT EXTRACTION Left    kidney stone removed     pfts     normal    Family Psychiatric History: anxiety. Mom used valium  Family History:  Family History  Problem Relation Age of Onset   COPD Mother    Hypertension Mother    Alzheimer's disease Mother        ?   Diabetes Father    COPD Father    Drug abuse Son    COPD Sister    Cancer Sister        lung    Social History:   Social History   Socioeconomic History   Marital status: Single    Spouse name: Not on file   Number of children: Not on file   Years of education: Not on file   Highest education level: Not on file  Occupational History   Not on file  Tobacco Use   Smoking status: Current Every Day Smoker    Packs/day: 1.00    Years: 25.00    Pack years: 25.00    Types: Cigarettes    Last attempt to quit: 02/10/2013    Years since quitting: 7.4   Smokeless tobacco: Never Used   Tobacco comment: using electronic cigarettes  Vaping Use   Vaping Use: Every day  Substance and Sexual Activity   Alcohol use: No    Alcohol/week: 0.0 standard drinks   Drug use: No   Sexual activity: Never    Partners: Female  Other Topics Concern   Not on file  Social History Narrative   Not on file   Social Determinants of Health   Financial Resource Strain:    Difficulty of Paying Living Expenses: Not on file  Food Insecurity:    Worried About Programme researcher, broadcasting/film/video in the Last Year: Not on file   [employer] of Food in the Last Year: Not on file  Transportation Needs:    Lack of Transportation (Medical): Not on file   Lack of Transportation (Non-Medical): Not on file  Physical Activity:    Days of Exercise per Week:  Not on file   Minutes of Exercise per Session: Not on file  Stress:    Feeling of Stress : Not on file  Social Connections:    Frequency of Communication with Friends and Family: Not on file   Frequency of Social Gatherings with Friends and Family: Not on file   Attends Religious Services: Not on file   Active Member of Clubs or Organizations: Not on file   Attends Banker Meetings: Not on file   Marital Status: Not on file      Allergies:  No Known Allergies  Metabolic Disorder Labs: Lab Results  Component Value Date   HGBA1C 6.1 (A) 07/08/2020   No results found for: PROLACTIN Lab Results  Component Value Date   CHOL 73 08/25/2019   TRIG 145 08/25/2019   HDL 33 (L) 08/25/2019   CHOLHDL 2.2 08/25/2019   VLDL 22 01/17/2016   LDLCALC 17 08/25/2019   LDLCALC 90 05/16/2018     Current Medications: Current Outpatient Medications  Medication Sig Dispense Refill   ALPRAZolam (XANAX) 0.5 MG tablet TAKE ONE TABLET BY MOUTH DAILY AS NEEDED FOR ANXIETY 30 tablet 0   aspirin 81 MG tablet Take 81 mg by mouth daily.       atorvastatin (LIPITOR) 40 MG tablet Take 1 tablet (40 mg total) by mouth at bedtime. 90 tablet 2   B-D UF III MINI PEN NEEDLES 31G X 5 MM MISC USE ONCE DAILY 100 each 9   empagliflozin (JARDIANCE) 10 MG TABS tablet Take 1 tablet (10 mg total) by mouth daily. 90 tablet 1   glucose blood (ONE TOUCH TEST STRIPS) test strip Pt testing one time a day. 100 each 3   hydrOXYzine (VISTARIL) 25 MG capsule Take 1 capsule (25 mg total) by mouth 3 (three) times daily as needed. 30 capsule 1   Insulin Glargine (BASAGLAR KWIKPEN) 100 UNIT/ML INJECT 38 UNITS TOTAL INTO THE SKIN EVERY NIGHT AT BEDTIME (Patient taking differently: Inject 20 Units into the skin at bedtime. ) 12 pen 2   lisinopril (ZESTRIL) 20 MG tablet TAKE ONE TABLET BY MOUTH DAILY 90 tablet 1   metFORMIN (GLUCOPHAGE) 1000 MG tablet Take 1 tablet (1,000 mg total) by mouth 2 (two) times  daily with a meal. 180 tablet 1   Misc. Devices MISC New CPAP machine with mask and supplies through [facility].  CPAP therapy at 14 cm. water pressure.     ONE TOUCH ULTRA TEST test strip TEST as directed 100 each 3   PARoxetine (PAXIL) 20 MG tablet Take 1 tablet (20 mg total) by mouth daily. 30 tablet 1  traZODone (DESYREL) 50 MG tablet Take 1 tablet (50 mg total) by mouth at bedtime. 90 tablet 1   No current facility-administered medications for this visit.      Psychiatric Specialty Exam: Review of Systems  Cardiovascular: Negative for chest pain.  Skin: Negative for rash.  Psychiatric/Behavioral: Negative for suicidal ideas.    There were no vitals taken for this visit.There is no height or weight on file to calculate BMI.  General Appearance:   Eye Contact:    Speech:  Slow  Volume:  Decreased  Mood: somewhat subdued  Affect:  congruent  Thought Process:  Goal Directed  Orientation:  Full (Time, Place, and Person)  Thought Content:  Rumination  Suicidal Thoughts:  No  Homicidal Thoughts:  No  Memory:  Immediate;   Fair Recent;   Fair  Judgement:  Poor  Insight:  Shallow  Psychomotor Activity:  Decreased  Concentration:  Concentration: Fair and Attention Span: Fair  Recall:  [employer] of Knowledge:Fair  Language: Fair  Akathisia:  Negative  Handed:  Right  AIMS (if indicated):  0  Assets:  Desire for Improvement  ADL's:  Intact  Cognition: WNL  Sleep:  fair    Treatment Plan Summary: Medication management and Plan as follows   1. Major depression moderate recurrent: somewhat subdued, continue paxil, work on distraction and in therapy to deal with lonliness. 2.GAD: fluctuates, continue xanax prn, paxil and therapy 3. Panic attacks:sporadic, on xanax, avoid nicotine 4. Nicotine dependence: now taking e cig. Working on cutting down  I discussed the assessment and treatment plan with the patient. The patient was provided an opportunity to ask  questions and all were answered. The patient agreed with the plan and demonstrated an understanding of the instructions.   The patient was advised to call back or seek an in-person evaluation if the symptoms worsen or if the condition fails to improve as anticipated.  I provided 15 minutes of non-face-to-face time during this encounter. Fu 2-3 m or earlier If needed  Thresa Ross, MD 9/28/202111:01 AM

## 2020-08-15 ENCOUNTER — Ambulatory Visit (INDEPENDENT_AMBULATORY_CARE_PROVIDER_SITE_OTHER): Payer: Medicare HMO | Admitting: Licensed Clinical Social Worker

## 2020-08-15 DIAGNOSIS — F411 Generalized anxiety disorder: Secondary | ICD-10-CM

## 2020-08-15 DIAGNOSIS — R69 Illness, unspecified: Secondary | ICD-10-CM | POA: Diagnosis not present

## 2020-08-16 NOTE — Progress Notes (Signed)
   THERAPIST PROGRESS NOTE  Session Time: 1:00 pm-1:40 pm  Participation Level: Active  Behavioral Response: CasualAlertAnxious  Type of Therapy: Individual Therapy  Treatment Goals addressed: Coping  Interventions: CBT and Solution Focused  Case Summary: Gabriel Gilbert is a 69 y.o. male who presents oriented x5 (person, place, situation, time, and object), casually dressed, appropriately groomed, average height, average weight, and cooperative to address anxiety. Patient has a history of medical treatment including hypertension, sleep apnea, and cataract. Patient has a history of mental health treatment including outpatient therapy, and medication management. Patient denies suicidal and homicidal ideations. Patient denies substance abuse. Patient is at low risk for lethality.  Session #4  Physically: Patient got his medication refilled. Patient continues to drink alcohol to ease his anxiety.  Spiritually/values: No issues identified. Relationships: Patient has limited social interactions. He spends time with his girlfriend and he has a friend that he talks to on a regular basis.   Emotionally/Mentally/Behavior: Patient's anxiety is the same. He feels like he has no life. Patient stays in the home all the time. Patient agreed to do something different by getting outside of his home and walking his dog a few times a week. This will disrupt his pattern, get him fresh air and sunlight, and get him moving.    Suicidal/Homicidal: Negativewithout intent/plan  Therapist Response: Therapist reviewed patient's recent thoughts and behaviors. Therapist utilized CBT to address anxiety. Therapist reviewed patient's feelings to identify triggers for anxiety. Therapist discussed with patient doing one thing different to disrupt this daily pattern and thought pattern.   Plan: Return again in 1 weeks.  Diagnosis: Axis I: Generalized Anxiety Disorder    Axis II: No diagnosis    Bynum Bellows,  LCSW 08/16/2020

## 2020-08-23 ENCOUNTER — Other Ambulatory Visit: Payer: Self-pay | Admitting: Family Medicine

## 2020-09-05 ENCOUNTER — Telehealth (HOSPITAL_COMMUNITY): Payer: Self-pay

## 2020-09-05 MED ORDER — ALPRAZOLAM 0.5 MG PO TABS
ORAL_TABLET | ORAL | 0 refills | Status: DC
Start: 2020-09-05 — End: 2020-10-10

## 2020-09-05 NOTE — Telephone Encounter (Signed)
Patient needs Alprazolam sent to [facility] in [place]

## 2020-09-05 NOTE — Telephone Encounter (Signed)
sent 

## 2020-09-19 DIAGNOSIS — R69 Illness, unspecified: Secondary | ICD-10-CM | POA: Diagnosis not present

## 2020-09-19 DIAGNOSIS — R911 Solitary pulmonary nodule: Secondary | ICD-10-CM | POA: Diagnosis not present

## 2020-09-19 DIAGNOSIS — Z9989 Dependence on other enabling machines and devices: Secondary | ICD-10-CM | POA: Diagnosis not present

## 2020-09-19 DIAGNOSIS — G4733 Obstructive sleep apnea (adult) (pediatric): Secondary | ICD-10-CM | POA: Diagnosis not present

## 2020-09-19 DIAGNOSIS — Z23 Encounter for immunization: Secondary | ICD-10-CM | POA: Diagnosis not present

## 2020-09-29 ENCOUNTER — Encounter (HOSPITAL_COMMUNITY): Payer: Self-pay | Admitting: Psychiatry

## 2020-09-29 ENCOUNTER — Telehealth (INDEPENDENT_AMBULATORY_CARE_PROVIDER_SITE_OTHER): Payer: Medicare HMO | Admitting: Psychiatry

## 2020-09-29 DIAGNOSIS — F411 Generalized anxiety disorder: Secondary | ICD-10-CM

## 2020-09-29 DIAGNOSIS — R69 Illness, unspecified: Secondary | ICD-10-CM | POA: Diagnosis not present

## 2020-09-29 DIAGNOSIS — Z7289 Other problems related to lifestyle: Secondary | ICD-10-CM | POA: Diagnosis not present

## 2020-09-29 DIAGNOSIS — F331 Major depressive disorder, recurrent, moderate: Secondary | ICD-10-CM | POA: Diagnosis not present

## 2020-09-29 DIAGNOSIS — Z789 Other specified health status: Secondary | ICD-10-CM

## 2020-09-29 MED ORDER — PAROXETINE HCL 20 MG PO TABS
30.0000 mg | ORAL_TABLET | Freq: Every day | ORAL | 0 refills | Status: DC
Start: 2020-09-29 — End: 2021-02-14

## 2020-09-29 NOTE — Progress Notes (Signed)
[facility] Outpatient Follow up visit  Telepsych Patient Identification: Gabriel Gilbert MRN:  [id number] Date of Evaluation:  09/29/2020 Referral Source: primary care.  Chief Complaint:    anxiety Visit Diagnosis:    ICD-10-CM   1. GAD (generalized anxiety disorder)  F41.1   2. Major depressive disorder, recurrent episode, moderate (HCC)  F33.1   3. Alcohol use  Z72.89     History of Present Illness:  69  years old single white male. Referred initially for management of depression     I connected with [name] on 09/29/20 at  3:15 PM EST by telephone and verified that I am speaking with the correct person using two identifiers.    I discussed the limitations, risks, security and privacy concerns of performing an evaluation and management service by telephone and the availability of in person appointments. I also discussed with the patient that there may be a patient responsible charge related to this service. The patient expressed understanding and agreed to proceed.  Patient location home  Provider location : home office  Called in today regarding xanax and was scheduled for appointment Xanax not due till few days so he got mad upset . He was edgy with me but then realized not to. Says he is drinking, discussed abstinence or get help in regard to alcohol . Also discussed benzo are not given with alcohol, he states gets anxious and we discussed xanax being control substance not to have early refills and rely on coping skills from therapy and paxil  Mostly stays at home ,watches tV  Denies hopelessness but endorses monotony in life     Continues to smoke also  He has denied using alcohol before but may have minimized use. He does state he is drinking more   Aggravating factor: lonliness ,modifying factor: dog,  Some friends.  Timing in morning   Past Psychiatric History: depression ,anxiety  Previous Psychotropic Medications: Yes   Substance Abuse History in the last  12 months:  No.  Consequences of Substance Abuse: NA  Past Medical History:  Past Medical History:  Diagnosis Date  . Bell's palsy 12-10  . Depression   . Diabetes mellitus    type 2  . Hyperlipidemia   . Hypertension     Past Surgical History:  Procedure Laterality Date  . CATARACT EXTRACTION Left   . kidney stone removed    . pfts     normal    Family Psychiatric History: anxiety. Mom used valium  Family History:  Family History  Problem Relation Age of Onset  . COPD Mother   . Hypertension Mother   . Alzheimer's disease Mother        ?  . Diabetes Father   . COPD Father   . Drug abuse Son   . COPD Sister   . Cancer Sister        lung    Social History:   Social History   Socioeconomic History  . Marital status: Single    Spouse name: Not on file  . Number of children: Not on file  . Years of education: Not on file  . Highest education level: Not on file  Occupational History  . Not on file  Tobacco Use  . Smoking status: Current Every Day Smoker    Packs/day: 1.00    Years: 25.00    Pack years: 25.00    Types: Cigarettes    Last attempt to quit: 02/10/2013    Years since  quitting: 7.6  . Smokeless tobacco: Never Used  . Tobacco comment: using electronic cigarettes  Vaping Use  . Vaping Use: Every day  Substance and Sexual Activity  . Alcohol use: No    Alcohol/week: 0.0 standard drinks  . Drug use: No  . Sexual activity: Never    Partners: Female  Other Topics Concern  . Not on file  Social History Narrative  . Not on file   Social Determinants of Health   Financial Resource Strain:   . Difficulty of Paying Living Expenses: Not on file  Food Insecurity:   . Worried About Programme researcher, broadcasting/film/video in the Last Year: Not on file  . Ran Out of Food in the Last Year: Not on file  Transportation Needs:   . Lack of Transportation (Medical): Not on file  . Lack of Transportation (Non-Medical): Not on file  Physical Activity:   . Days of Exercise  per Week: Not on file  . Minutes of Exercise per Session: Not on file  Stress:   . Feeling of Stress : Not on file  Social Connections:   . Frequency of Communication with Friends and Family: Not on file  . Frequency of Social Gatherings with Friends and Family: Not on file  . Attends Religious Services: Not on file  . Active Member of Clubs or Organizations: Not on file  . Attends Banker Meetings: Not on file  . Marital Status: Not on file      Allergies:  No Known Allergies  Metabolic Disorder Labs: Lab Results  Component Value Date   HGBA1C 6.1 (A) 07/08/2020   No results found for: PROLACTIN Lab Results  Component Value Date   CHOL 73 08/25/2019   TRIG 145 08/25/2019   HDL 33 (L) 08/25/2019   CHOLHDL 2.2 08/25/2019   VLDL 22 01/17/2016   LDLCALC 17 08/25/2019   LDLCALC 90 05/16/2018     Current Medications: Current Outpatient Medications  Medication Sig Dispense Refill  . ALPRAZolam (XANAX) 0.5 MG tablet One a day if needed 30 tablet 0  . aspirin 81 MG tablet Take 81 mg by mouth daily.      Marland Kitchen atorvastatin (LIPITOR) 40 MG tablet Take 1 tablet (40 mg total) by mouth at bedtime. 90 tablet 2  . B-D UF III MINI PEN NEEDLES 31G X 5 MM MISC USE ONCE DAILY 100 each 9  . empagliflozin (JARDIANCE) 10 MG TABS tablet Take 1 tablet (10 mg total) by mouth daily. 90 tablet 1  . glucose blood (ONE TOUCH TEST STRIPS) test strip Pt testing one time a day. 100 each 3  . hydrOXYzine (VISTARIL) 25 MG capsule Take 1 capsule (25 mg total) by mouth 3 (three) times daily as needed. 30 capsule 1  . Insulin Glargine (BASAGLAR KWIKPEN) 100 UNIT/ML Inject 38 Units into the skin at bedtime. 45 mL 0  . lisinopril (ZESTRIL) 20 MG tablet TAKE ONE TABLET BY MOUTH DAILY 90 tablet 1  . metFORMIN (GLUCOPHAGE) 1000 MG tablet Take 1 tablet (1,000 mg total) by mouth 2 (two) times daily with a meal. 180 tablet 1  . Misc. Devices MISC New CPAP machine with mask and supplies through [facility].  CPAP therapy at 14 cm. water pressure.    . ONE TOUCH ULTRA TEST test strip TEST as directed 100 each 3  . PARoxetine (PAXIL) 20 MG tablet Take 1.5 tablets (30 mg total) by mouth daily. Dose increased 09/29/20 to 30mg  45 tablet 0  .  traZODone (DESYREL) 50 MG tablet Take 1 tablet (50 mg total) by mouth at bedtime. 90 tablet 1   No current facility-administered medications for this visit.      Psychiatric Specialty Exam: Review of Systems  Cardiovascular: Negative for chest pain.  Skin: Negative for rash.  Psychiatric/Behavioral: Negative for suicidal ideas.    There were no vitals taken for this visit.There is no height or weight on file to calculate BMI.  General Appearance:   Eye Contact:    Speech:  Slow  Volume:  Decreased  Mood: anxious  Affect:  congruent  Thought Process:  Goal Directed  Orientation:  Full (Time, Place, and Person)  Thought Content:  Rumination  Suicidal Thoughts:  No  Homicidal Thoughts:  No  Memory:  Immediate;   Fair Recent;   Fair  Judgement:  Poor  Insight:  Shallow  Psychomotor Activity:  Decreased  Concentration:  Concentration: Fair and Attention Span: Fair  Recall:  [employer] of Knowledge:Fair  Language: Fair  Akathisia:  Negative  Handed:  Right  AIMS (if indicated):  0  Assets:  Desire for Improvement  ADL's:  Intact  Cognition: WNL  Sleep:  fair    Treatment Plan Summary: Medication management and Plan as follows   1. Major depression moderate recurrent:  Subdued, increase paxil to 30mg  2.GAD: anxious, discussed to abstain alcohol and consider AA or SaIOP programs, he remains reluctant Will increase paxil to 30mg , discussed not to combine xanax with alcohol  3. Panic attacks:sporadic, on xanax, avoid nicotine, increase paxil 4. Nicotine dependence: now taking e cig. Working on cutting down  To work on and therapy so not dwell on getting agitated, says he was not getting mad on me and will increase  paxil today  I discussed the assessment and treatment plan with the patient. The patient was provided an opportunity to ask questions and all were answered. The patient agreed with the plan and demonstrated an understanding of the instructions.   The patient was advised to call back or seek an in-person evaluation if the symptoms worsen or if the condition fails to improve as anticipated.  I provided 15 minutes of non-face-to-face time during this encounter. FU 4 weeks  or earlier If needed  , MD 11/18/20213:34 PM

## 2020-10-10 ENCOUNTER — Telehealth (HOSPITAL_COMMUNITY): Payer: Self-pay

## 2020-10-10 ENCOUNTER — Ambulatory Visit (INDEPENDENT_AMBULATORY_CARE_PROVIDER_SITE_OTHER): Payer: Medicare HMO | Admitting: Family Medicine

## 2020-10-10 ENCOUNTER — Other Ambulatory Visit: Payer: Self-pay

## 2020-10-10 ENCOUNTER — Encounter: Payer: Self-pay | Admitting: Family Medicine

## 2020-10-10 VITALS — BP 136/70 | HR 66 | Temp 98.2°F | Ht 66.0 in | Wt 181.0 lb

## 2020-10-10 DIAGNOSIS — R69 Illness, unspecified: Secondary | ICD-10-CM | POA: Diagnosis not present

## 2020-10-10 DIAGNOSIS — F17210 Nicotine dependence, cigarettes, uncomplicated: Secondary | ICD-10-CM

## 2020-10-10 DIAGNOSIS — R911 Solitary pulmonary nodule: Secondary | ICD-10-CM | POA: Diagnosis not present

## 2020-10-10 DIAGNOSIS — Z125 Encounter for screening for malignant neoplasm of prostate: Secondary | ICD-10-CM | POA: Diagnosis not present

## 2020-10-10 DIAGNOSIS — I10 Essential (primary) hypertension: Secondary | ICD-10-CM | POA: Diagnosis not present

## 2020-10-10 DIAGNOSIS — Z794 Long term (current) use of insulin: Secondary | ICD-10-CM | POA: Diagnosis not present

## 2020-10-10 DIAGNOSIS — E11630 Type 2 diabetes mellitus with periodontal disease: Secondary | ICD-10-CM

## 2020-10-10 LAB — POCT GLYCOSYLATED HEMOGLOBIN (HGB A1C): Hemoglobin A1C: 6.3 % — AB (ref 4.0–5.6)

## 2020-10-10 MED ORDER — ALPRAZOLAM 0.5 MG PO TABS
ORAL_TABLET | ORAL | 0 refills | Status: DC
Start: 2020-10-10 — End: 2020-11-08

## 2020-10-10 NOTE — Telephone Encounter (Signed)
Patient needs a refill on Alprazolam sent to [facility] in [place] 

## 2020-10-10 NOTE — Assessment & Plan Note (Signed)
A1c looks fantastic today at 6.3 though up a little bit from previous but continue to work on healthy diet and regular exercise.  We did discuss combining his Jardiance and Metformin together.  But he is likely on a change insurances in January and wants to hold off

## 2020-10-10 NOTE — Assessment & Plan Note (Addendum)
Blood pressure at goal.  Continue current regimen due for updated blood work including CMP, lipid.

## 2020-10-10 NOTE — Telephone Encounter (Signed)
sent 

## 2020-10-10 NOTE — Assessment & Plan Note (Signed)
Discussed option of nicotine patches and other options to help him quit smoking really encouraged him to set goals such as trying to get down to half a pack a day.  He reports a family history of lung cancer and COPD and just reminded him that that may be his future if he continues to smoke.

## 2020-10-10 NOTE — Progress Notes (Signed)
Established Patient Office Visit  Subjective:  Patient ID: Gabriel Gilbert, male    DOB: [birth year]  Age: 69 y.o. MRN: [id number]  CC:  Chief Complaint  Patient presents with  . Diabetes    HPI Gabriel Gilbert presents for   Hypertension- Pt denies chest pain, SOB, dizziness, or heart palpitations.  Taking meds as directed w/o problems.  Denies medication side effects.    Diabetes - no hypoglycemic events. No wounds or sores that are not healing well. No increased thirst or urination. Checking glucose at home. Taking medications as prescribed without any side effects.  He does report that the [place] has been quite expensive in fact he thinks it is between $80 and $100 per month.  Ran out about 4 days ago.    He says he is a little nervous because he supposed to have a CAT scan tomorrow to follow-up on [facility]  He has been having a small cough intermittent lately that he does not normally have.  He smokes about a pack per day and says he has tried to quit with nicotine replacement but it really was not helpful.  Past Medical History:  Diagnosis Date  . Bell's palsy 12-10  . Depression   . Diabetes mellitus    type 2  . Hyperlipidemia   . Hypertension     Past Surgical History:  Procedure Laterality Date  . CATARACT EXTRACTION Left   . kidney stone removed    . pfts     normal    Family History  Problem Relation Age of Onset  . COPD Mother   . Hypertension Mother   . Alzheimer's disease Mother        ?  . Diabetes Father   . COPD Father   . Drug abuse Son   . COPD Sister   . Cancer Sister        lung    Social History   Socioeconomic History  . Marital status: Single    Spouse name: Not on file  . Number of children: Not on file  . Years of education: Not on file  . Highest education level: Not on file  Occupational History  . Not on file  Tobacco Use  . Smoking status: Current Every Day  Smoker    Packs/day: 1.00    Years: 25.00    Pack years: 25.00    Types: Cigarettes    Last attempt to quit: 02/10/2013    Years since quitting: 7.6  . Smokeless tobacco: Never Used  . Tobacco comment: using electronic cigarettes  Vaping Use  . Vaping Use: Every day  Substance and Sexual Activity  . Alcohol use: No    Alcohol/week: 0.0 standard drinks  . Drug use: No  . Sexual activity: Never    Partners: Female  Other Topics Concern  . Not on file  Social History Narrative  . Not on file   Social Determinants of Health   Financial Resource Strain:   . Difficulty of Paying Living Expenses: Not on file  Food Insecurity:   . Worried About Programme researcher, broadcasting/film/video in the Last Year: Not on file  . Ran Out of Food in the Last Year: Not on file  Transportation Needs:   . Lack of Transportation (Medical): Not on file  . Lack of Transportation (Non-Medical): Not on file  Physical Activity:   . Days of Exercise per Week:  Not on file  . Minutes of Exercise per Session: Not on file  Stress:   . Feeling of Stress : Not on file  Social Connections:   . Frequency of Communication with Friends and Family: Not on file  . Frequency of Social Gatherings with Friends and Family: Not on file  . Attends Religious Services: Not on file  . Active Member of Clubs or Organizations: Not on file  . Attends Banker Meetings: Not on file  . Marital Status: Not on file  Intimate Partner Violence:   . Fear of Current or Ex-Partner: Not on file  . Emotionally Abused: Not on file  . Physically Abused: Not on file  . Sexually Abused: Not on file    Outpatient Medications Prior to Visit  Medication Sig Dispense Refill  . aspirin 81 MG tablet Take 81 mg by mouth daily.      Marland Kitchen atorvastatin (LIPITOR) 40 MG tablet Take 1 tablet (40 mg total) by mouth at bedtime. 90 tablet 2  . B-D UF III MINI PEN NEEDLES 31G X 5 MM MISC USE ONCE DAILY 100 each 9  . empagliflozin (JARDIANCE) 10 MG TABS tablet  Take 1 tablet (10 mg total) by mouth daily. 90 tablet 1  . glucose blood (ONE TOUCH TEST STRIPS) test strip Pt testing one time a day. 100 each 3  . hydrOXYzine (VISTARIL) 25 MG capsule Take 1 capsule (25 mg total) by mouth 3 (three) times daily as needed. 30 capsule 1  . Insulin Glargine (BASAGLAR KWIKPEN) 100 UNIT/ML Inject 38 Units into the skin at bedtime. 45 mL 0  . lisinopril (ZESTRIL) 20 MG tablet TAKE ONE TABLET BY MOUTH DAILY 90 tablet 1  . metFORMIN (GLUCOPHAGE) 1000 MG tablet Take 1 tablet (1,000 mg total) by mouth 2 (two) times daily with a meal. 180 tablet 1  . Misc. Devices MISC New CPAP machine with mask and supplies through [facility].  CPAP therapy at 14 cm. water pressure.    . ONE TOUCH ULTRA TEST test strip TEST as directed 100 each 3  . PARoxetine (PAXIL) 20 MG tablet Take 1.5 tablets (30 mg total) by mouth daily. Dose increased 09/29/20 to 30mg  45 tablet 0  . traZODone (DESYREL) 50 MG tablet Take 1 tablet (50 mg total) by mouth at bedtime. 90 tablet 1  . ALPRAZolam (XANAX) 0.5 MG tablet One a day if needed 30 tablet 0   No facility-administered medications prior to visit.    No Known Allergies  ROS Review of Systems    Objective:    Physical Exam Constitutional:      Appearance: He is well-developed.  HENT:     Head: Normocephalic and atraumatic.  Cardiovascular:     Rate and Rhythm: Normal rate and regular rhythm.     Heart sounds: Normal heart sounds.  Pulmonary:     Effort: Pulmonary effort is normal.     Breath sounds: Normal breath sounds.  Skin:    General: Skin is warm and dry.  Neurological:     Mental Status: He is alert and oriented to person, place, and time.  Psychiatric:        Behavior: Behavior normal.     BP 136/70   Pulse 66   Temp 98.2 F (36.8 C) (Oral)   Ht 5\' 6"  (1.676 m)   Wt 181 lb (82.1 kg)   SpO2 99% Comment: on RA  BMI 29.21 kg/m  Wt Readings from Last 3 Encounters:  10/10/20 181 lb (82.1 kg)  07/08/20 177  lb (80.3 kg)  06/30/20 172 lb (78 kg)     There are no preventive care reminders to display for this patient.  There are no preventive care reminders to display for this patient.  Lab Results  Component Value Date   TSH 1.580 01/25/2015   Lab Results  Component Value Date   WBC 15.2 (H) 07/10/2019   HGB 16.5 07/10/2019   HCT 49.5 07/10/2019   MCV 93.9 07/10/2019   PLT 333 07/10/2019   Lab Results  Component Value Date   NA 139 03/25/2020   K 4.5 03/25/2020   CO2 29 03/25/2020   GLUCOSE 100 (H) 03/25/2020   BUN 18 03/25/2020   CREATININE 0.84 03/25/2020   BILITOT 0.5 08/25/2019   ALKPHOS 61 06/06/2017   AST 12 08/25/2019   ALT 6 (L) 08/25/2019   PROT 6.4 08/25/2019   ALBUMIN 4.1 06/06/2017   CALCIUM 9.7 03/25/2020   Lab Results  Component Value Date   CHOL 73 08/25/2019   Lab Results  Component Value Date   HDL 33 (L) 08/25/2019   Lab Results  Component Value Date   LDLCALC 17 08/25/2019   Lab Results  Component Value Date   TRIG 145 08/25/2019   Lab Results  Component Value Date   CHOLHDL 2.2 08/25/2019   Lab Results  Component Value Date   HGBA1C 6.3 (A) 10/10/2020      Assessment & Plan:   Problem List Items Addressed This Visit      Cardiovascular and Mediastinum   Essential hypertension, benign    Blood pressure at goal.  Continue current regimen due for updated blood work including CMP, lipid.      Relevant Orders   POCT glycosylated hemoglobin (Hb A1C) (Completed)   Lipid Panel w/reflex Direct LDL   COMPLETE METABOLIC PANEL WITH GFR     Endocrine   Type 2 diabetes mellitus with periodontal complication (HCC) - Primary    A1c looks fantastic today at 6.3 though up a little bit from previous but continue to work on healthy diet and regular exercise.  We did discuss combining his Jardiance and Metformin together.  But he is likely on a change insurances in January and wants to hold off      Relevant Orders   POCT glycosylated  hemoglobin (Hb A1C) (Completed)   Lipid Panel w/reflex Direct LDL   COMPLETE METABOLIC PANEL WITH GFR     Other   Pulmonary nodule, left    Plan for repeat CT scheduled tomorrow to follow-up on 4.5 mm pulmonary nodule      Nicotine dependence, uncomplicated    Discussed option of nicotine patches and other options to help him quit smoking really encouraged him to set goals such as trying to get down to half a pack a day.  He reports a family history of lung cancer and COPD and just reminded him that that may be his future if he continues to smoke.       Other Visit Diagnoses    Prostate cancer screening       Relevant Orders   PSA      No orders of the defined types were placed in this encounter.   Follow-up: Return in about 4 months (around 02/07/2021) for Diabetes follow-up, foot exam diabetes .    Nani Gasser, MD

## 2020-10-10 NOTE — Assessment & Plan Note (Signed)
Plan for repeat CT scheduled tomorrow to follow-up on 4.5 mm pulmonary nodule

## 2020-10-11 DIAGNOSIS — Z122 Encounter for screening for malignant neoplasm of respiratory organs: Secondary | ICD-10-CM | POA: Diagnosis not present

## 2020-10-11 DIAGNOSIS — R69 Illness, unspecified: Secondary | ICD-10-CM | POA: Diagnosis not present

## 2020-10-11 DIAGNOSIS — Z87891 Personal history of nicotine dependence: Secondary | ICD-10-CM | POA: Diagnosis not present

## 2020-10-17 DIAGNOSIS — E11630 Type 2 diabetes mellitus with periodontal disease: Secondary | ICD-10-CM | POA: Diagnosis not present

## 2020-10-17 DIAGNOSIS — Z125 Encounter for screening for malignant neoplasm of prostate: Secondary | ICD-10-CM | POA: Diagnosis not present

## 2020-10-17 DIAGNOSIS — I10 Essential (primary) hypertension: Secondary | ICD-10-CM | POA: Diagnosis not present

## 2020-10-17 DIAGNOSIS — Z794 Long term (current) use of insulin: Secondary | ICD-10-CM | POA: Diagnosis not present

## 2020-10-18 LAB — LIPID PANEL W/REFLEX DIRECT LDL
Cholesterol: 160 mg/dL (ref <200)
HDL: 48 mg/dL (ref >=40)
LDL Cholesterol (Calc): 84 mg/dL (calc)
Non-HDL Cholesterol (Calc): 112 mg/dL (calc) (ref <130)
Total CHOL/HDL Ratio: 3.3 (calc) (ref <5.0)
Triglycerides: 190 mg/dL — ABNORMAL HIGH (ref <150)

## 2020-10-18 LAB — COMPLETE METABOLIC PANEL WITH GFR
AG Ratio: 2 (calc) (ref 1.0–2.5)
ALT: 12 U/L (ref 9–46)
AST: 15 U/L (ref 10–35)
Albumin: 4.4 g/dL (ref 3.6–5.1)
Alkaline phosphatase (APISO): 67 U/L (ref 35–144)
BUN: 11 mg/dL (ref 7–25)
CO2: 25 mmol/L (ref 20–32)
Calcium: 9.7 mg/dL (ref 8.6–10.3)
Chloride: 103 mmol/L (ref 98–110)
Creat: 0.8 mg/dL (ref 0.70–1.25)
GFR, Est African American: 106 mL/min/{1.73_m2} (ref >=60)
GFR, Est Non African American: 92 mL/min/{1.73_m2} (ref >=60)
Globulin: 2.2 g/dL (calc) (ref 1.9–3.7)
Glucose, Bld: 93 mg/dL (ref 65–99)
Potassium: 4.5 mmol/L (ref 3.5–5.3)
Sodium: 140 mmol/L (ref 135–146)
Total Bilirubin: 0.4 mg/dL (ref 0.2–1.2)
Total Protein: 6.6 g/dL (ref 6.1–8.1)

## 2020-10-18 LAB — PSA: PSA: 1.38 ng/mL (ref <=4.0)

## 2020-10-19 NOTE — Progress Notes (Signed)
Pt advised.

## 2020-11-08 ENCOUNTER — Telehealth (HOSPITAL_COMMUNITY): Payer: Self-pay

## 2020-11-08 MED ORDER — ALPRAZOLAM 0.5 MG PO TABS
ORAL_TABLET | ORAL | 0 refills | Status: DC
Start: 2020-11-08 — End: 2020-12-13

## 2020-11-08 NOTE — Telephone Encounter (Signed)
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 Do you mind sending this medication in please. Thanks.

## 2020-11-08 NOTE — Telephone Encounter (Signed)
Done

## 2020-11-16 ENCOUNTER — Other Ambulatory Visit: Payer: Self-pay | Admitting: Family Medicine

## 2020-11-17 ENCOUNTER — Ambulatory Visit (INDEPENDENT_AMBULATORY_CARE_PROVIDER_SITE_OTHER): Payer: Medicare HMO

## 2020-11-17 ENCOUNTER — Ambulatory Visit (INDEPENDENT_AMBULATORY_CARE_PROVIDER_SITE_OTHER): Payer: Medicare HMO | Admitting: Family Medicine

## 2020-11-17 ENCOUNTER — Other Ambulatory Visit: Payer: Self-pay

## 2020-11-17 ENCOUNTER — Encounter: Payer: Self-pay | Admitting: Family Medicine

## 2020-11-17 VITALS — BP 130/64 | HR 83 | Ht 66.0 in | Wt 183.0 lb

## 2020-11-17 DIAGNOSIS — M79605 Pain in left leg: Secondary | ICD-10-CM

## 2020-11-17 NOTE — Progress Notes (Signed)
Acute Office Visit  Subjective:    Patient ID: Gabriel Gilbert, male    DOB: 1951/07/08, 70 y.o.   MRN: [id number]  Chief Complaint  Patient presents with  . Leg Pain    L leg pain     HPI Patient is in today for sudden onset of left posterior thigh pain about 3 days ago.  Denies any known triggers or injuries.  Says it just started gradually and has been persistent today is about a 2 out of 10 but it has been as bad as an 8 out of 10.  He denies any tenderness over the area.  Its not necessarily worse with walking or activity.  Says he feels like it is worse at the end of the day.  He has not noticed any rash, swelling or bruising over the area.  No prior history of DVT or pulmonary embolism.  No recent prolonged travel.  Though he reports he is very sedentary.  Past Medical History:  Diagnosis Date  . Bell's palsy 12-10  . Depression   . Diabetes mellitus    type 2  . Hyperlipidemia   . Hypertension     Past Surgical History:  Procedure Laterality Date  . CATARACT EXTRACTION Left   . kidney stone removed    . pfts     normal    Family History  Problem Relation Age of Onset  . COPD Mother   . Hypertension Mother   . Alzheimer's disease Mother        ?  . Diabetes Father   . COPD Father   . Drug abuse Son   . COPD Sister   . Cancer Sister        lung    Social History   Socioeconomic History  . Marital status: Single    Spouse name: Not on file  . Number of children: Not on file  . Years of education: Not on file  . Highest education level: Not on file  Occupational History  . Not on file  Tobacco Use  . Smoking status: Current Every Day Smoker    Packs/day: 1.00    Years: 25.00    Pack years: 25.00    Types: Cigarettes    Last attempt to quit: 02/10/2013    Years since quitting: 7.7  . Smokeless tobacco: Never Used  . Tobacco comment: using electronic cigarettes  Vaping Use  . Vaping Use: Every day  Substance and Sexual Activity  . Alcohol use:  No    Alcohol/week: 0.0 standard drinks  . Drug use: No  . Sexual activity: Never    Partners: Female  Other Topics Concern  . Not on file  Social History Narrative  . Not on file   Social Determinants of Health   Financial Resource Strain: Not on file  Food Insecurity: Not on file  Transportation Needs: Not on file  Physical Activity: Not on file  Stress: Not on file  Social Connections: Not on file  Intimate Partner Violence: Not on file    Outpatient Medications Prior to Visit  Medication Sig Dispense Refill  . ALPRAZolam (XANAX) 0.5 MG tablet One a day if needed 30 tablet 0  . aspirin 81 MG tablet Take 81 mg by mouth daily.    Marland Kitchen atorvastatin (LIPITOR) 40 MG tablet Take 1 tablet (40 mg total) by mouth at bedtime. 90 tablet 2  . B-D UF III MINI PEN NEEDLES 31G X 5 MM MISC USE ONCE DAILY  100 each 9  . empagliflozin (JARDIANCE) 10 MG TABS tablet Take 1 tablet (10 mg total) by mouth daily. 90 tablet 1  . glucose blood (ONE TOUCH TEST STRIPS) test strip Pt testing one time a day. 100 each 3  . hydrOXYzine (VISTARIL) 25 MG capsule TAKE ONE CAPSULE BY MOUTH THREE TIMES A DAY AS NEEDED 30 capsule 1  . Insulin Glargine (BASAGLAR KWIKPEN) 100 UNIT/ML Inject 38 Units into the skin at bedtime. 45 mL 0  . lisinopril (ZESTRIL) 20 MG tablet TAKE ONE TABLET BY MOUTH DAILY 90 tablet 1  . metFORMIN (GLUCOPHAGE) 1000 MG tablet Take 1 tablet (1,000 mg total) by mouth 2 (two) times daily with a meal. 180 tablet 1  . Misc. Devices MISC New CPAP machine with mask and supplies through [facility].  CPAP therapy at 14 cm. water pressure.    . ONE TOUCH ULTRA TEST test strip TEST as directed 100 each 3  . PARoxetine (PAXIL) 20 MG tablet Take 1.5 tablets (30 mg total) by mouth daily. Dose increased 09/29/20 to 30mg  45 tablet 0  . traZODone (DESYREL) 50 MG tablet Take 1 tablet (50 mg total) by mouth at bedtime. 90 tablet 1   No facility-administered medications prior to visit.    No Known  Allergies  Review of Systems     Objective:    Physical Exam Vitals reviewed.  Constitutional:      Appearance: He is well-developed and well-nourished.  HENT:     Head: Normocephalic and atraumatic.  Eyes:     Extraocular Movements: EOM normal.     Conjunctiva/sclera: Conjunctivae normal.  Cardiovascular:     Rate and Rhythm: Normal rate.  Pulmonary:     Effort: Pulmonary effort is normal.  Musculoskeletal:     Comments: Left hip knee and ankle with normal range of motion and strength.  Patellar reflexes 1+ bilaterally.  Nontender over the posterior mid thigh.  No palpable knot or swelling or phlebitis.  Skin:    General: Skin is dry.     Coloration: Skin is not pale.  Neurological:     Mental Status: He is alert and oriented to person, place, and time.  Psychiatric:        Mood and Affect: Mood and affect normal.        Behavior: Behavior normal.     BP 130/64   Pulse 83   Ht 5\' 6"  (1.676 m)   Wt 183 lb (83 kg)   SpO2 98%   BMI 29.54 kg/m  Wt Readings from Last 3 Encounters:  11/17/20 183 lb (83 kg)  10/10/20 181 lb (82.1 kg)  07/08/20 177 lb (80.3 kg)    There are no preventive care reminders to display for this patient.  There are no preventive care reminders to display for this patient.   Lab Results  Component Value Date   TSH 1.580 01/25/2015   Lab Results  Component Value Date   WBC 15.2 (H) 07/10/2019   HGB 16.5 07/10/2019   HCT 49.5 07/10/2019   MCV 93.9 07/10/2019   PLT 333 07/10/2019   Lab Results  Component Value Date   NA 140 10/17/2020   K 4.5 10/17/2020   CO2 25 10/17/2020   GLUCOSE 93 10/17/2020   BUN 11 10/17/2020   CREATININE 0.80 10/17/2020   BILITOT 0.4 10/17/2020   ALKPHOS 61 06/06/2017   AST 15 10/17/2020   ALT 12 10/17/2020   PROT 6.6 10/17/2020   ALBUMIN 4.1 06/06/2017  CALCIUM 9.7 10/17/2020   Lab Results  Component Value Date   CHOL 160 10/17/2020   Lab Results  Component Value Date   HDL 48 10/17/2020    Lab Results  Component Value Date   LDLCALC 84 10/17/2020   Lab Results  Component Value Date   TRIG 190 (H) 10/17/2020   Lab Results  Component Value Date   CHOLHDL 3.3 10/17/2020   Lab Results  Component Value Date   HGBA1C 6.3 (A) 10/10/2020       Assessment & Plan:   Problem List Items Addressed This Visit   None   Visit Diagnoses    Acute leg pain, left    -  Primary   Relevant Orders   VAS [place] LOWER EXTREMITY VENOUS (DVT)   US Venous Img Lower Unilateral Left (Completed)     Left posterior leg pain-unclear etiology he really did not have any injury or trigger.  He has normal exam today.  Nontender.  He does sit for hours at a time he is extremely sedentary so we did discuss the possibility of a DVT and recommend that he get a Doppler for further evaluation.  No orders of the defined types were placed in this encounter.    Beatrice Lecher, MD

## 2020-11-17 NOTE — Progress Notes (Signed)
Pain in L leg x4 days. His highest pain has been 8/10. His pain today is 2/10. He describes the pain as sharp and constant. He has used Advil to help with this.

## 2020-11-22 ENCOUNTER — Telehealth (HOSPITAL_COMMUNITY): Payer: Medicare HMO | Admitting: Psychiatry

## 2020-11-25 DIAGNOSIS — G4733 Obstructive sleep apnea (adult) (pediatric): Secondary | ICD-10-CM | POA: Diagnosis not present

## 2020-12-12 ENCOUNTER — Other Ambulatory Visit (HOSPITAL_COMMUNITY): Payer: Self-pay | Admitting: Psychiatry

## 2020-12-13 ENCOUNTER — Telehealth (HOSPITAL_COMMUNITY): Payer: Self-pay

## 2020-12-13 NOTE — Telephone Encounter (Signed)
Patient needs a refill on Alprazolam sent to [facility] in [place]

## 2020-12-13 NOTE — Telephone Encounter (Signed)
sent 

## 2021-01-10 ENCOUNTER — Telehealth (HOSPITAL_COMMUNITY): Payer: Self-pay

## 2021-01-10 MED ORDER — ALPRAZOLAM 0.5 MG PO TABS: ORAL_TABLET | ORAL | 0 refills | Status: DC

## 2021-01-10 NOTE — Telephone Encounter (Signed)
Patient called requesting a refill on his Alprazolam 0.5mg  to be sent to [facility] on [address]/[place]. Last appointment was 09/29/20

## 2021-01-10 NOTE — Telephone Encounter (Signed)
Sent. Also should schedule within a month or two if doing stable

## 2021-01-16 NOTE — Telephone Encounter (Signed)
Notified patient - LVM 

## 2021-01-24 ENCOUNTER — Telehealth (INDEPENDENT_AMBULATORY_CARE_PROVIDER_SITE_OTHER): Payer: Medicare HMO | Admitting: Psychiatry

## 2021-01-24 ENCOUNTER — Encounter (HOSPITAL_COMMUNITY): Payer: Self-pay | Admitting: Psychiatry

## 2021-01-24 DIAGNOSIS — F331 Major depressive disorder, recurrent, moderate: Secondary | ICD-10-CM | POA: Diagnosis not present

## 2021-01-24 DIAGNOSIS — Z789 Other specified health status: Secondary | ICD-10-CM

## 2021-01-24 DIAGNOSIS — F411 Generalized anxiety disorder: Secondary | ICD-10-CM | POA: Diagnosis not present

## 2021-01-24 DIAGNOSIS — Z7289 Other problems related to lifestyle: Secondary | ICD-10-CM

## 2021-01-24 MED ORDER — FLUOXETINE HCL 10 MG PO CAPS: 10.0000 mg | ORAL_CAPSULE | Freq: Every day | ORAL | 2 refills | Status: DC

## 2021-01-24 NOTE — Progress Notes (Signed)
[facility] Outpatient Follow up visit  Telepsych Patient Identification: Gabriel Gilbert MRN:  [id number] Date of Evaluation:  01/24/2021 Referral Source: primary care.  Chief Complaint:    anxiety Visit Diagnosis:    ICD-10-CM   1. Major depressive disorder, recurrent episode, moderate (HCC)  F33.1   2. GAD (generalized anxiety disorder)  F41.1   3. Alcohol use  Z72.89    Virtual Visit via Telephone Note  I connected with Gabriel Gilbert on 01/24/21 at  8:30 AM EDT by telephone and verified that I am speaking with the correct person using two identifiers.  Location: Patient: home Provider: office   I discussed the limitations, risks, security and privacy concerns of performing an evaluation and management service by telephone and the availability of in person appointments. I also discussed with the patient that there may be a patient responsible charge related to this service. The patient expressed understanding and agreed to proceed.      I discussed the assessment and treatment plan with the patient. The patient was provided an opportunity to ask questions and all were answered. The patient agreed with the plan and demonstrated an understanding of the instructions.   The patient was advised to call back or seek an in-person evaluation if the symptoms worsen or if the condition fails to improve as anticipated.  I provided 11 minutes of non-face-to-face time during this encounter.  HPI:   Stays at home mostly, xanax helps anxiety, feels subdued and lonely Has stopped paxil earlier  Has few friends, still drinks alcohol but stays not much  Denies hopelessness but endorses monotony in life     Continues to smoke also Aggravating factor: lonliness ,modifying factor: dog,  Some friends.  Timing in morning   Past Psychiatric History: depression ,anxiety  Previous Psychotropic Medications: Yes   Substance Abuse History in the last 12 months:  No.  Consequences of Substance  Abuse: NA  Past Medical History:  Past Medical History:  Diagnosis Date  . Bell's palsy 12-10  . Depression   . Diabetes mellitus    type 2  . Hyperlipidemia   . Hypertension     Past Surgical History:  Procedure Laterality Date  . CATARACT EXTRACTION Left   . kidney stone removed    . pfts     normal    Family Psychiatric History: anxiety. Mom used valium  Family History:  Family History  Problem Relation Age of Onset  . COPD Mother   . Hypertension Mother   . Alzheimer's disease Mother        ?  . Diabetes Father   . COPD Father   . Drug abuse Son   . COPD Sister   . Cancer Sister        lung    Social History:   Social History   Socioeconomic History  . Marital status: Single    Spouse name: Not on file  . Number of children: Not on file  . Years of education: Not on file  . Highest education level: Not on file  Occupational History  . Not on file  Tobacco Use  . Smoking status: Current Every Day Smoker    Packs/day: 1.00    Years: 25.00    Pack years: 25.00    Types: Cigarettes    Last attempt to quit: 02/10/2013    Years since quitting: 7.9  . Smokeless tobacco: Never Used  . Tobacco comment: using electronic cigarettes  Vaping Use  . Vaping  Use: Every day  Substance and Sexual Activity  . Alcohol use: No    Alcohol/week: 0.0 standard drinks  . Drug use: No  . Sexual activity: Never    Partners: Female  Other Topics Concern  . Not on file  Social History Narrative  . Not on file   Social Determinants of Health   Financial Resource Strain: Not on file  Food Insecurity: Not on file  Transportation Needs: Not on file  Physical Activity: Not on file  Stress: Not on file  Social Connections: Not on file      Allergies:  No Known Allergies  Metabolic Disorder Labs: Lab Results  Component Value Date   HGBA1C 6.3 (A) 10/10/2020   No results found for: PROLACTIN Lab Results  Component Value Date   CHOL 160 10/17/2020   TRIG 190  (H) 10/17/2020   HDL 48 10/17/2020   CHOLHDL 3.3 10/17/2020   VLDL 22 01/17/2016   LDLCALC 84 10/17/2020   LDLCALC 17 08/25/2019     Current Medications: Current Outpatient Medications  Medication Sig Dispense Refill  . FLUoxetine (PROZAC) 10 MG capsule Take 1 capsule (10 mg total) by mouth daily. 30 capsule 2  . ALPRAZolam (XANAX) 0.5 MG tablet TAKE ONE TABLET BY MOUTH DAILY AS NEEDED 30 tablet 0  . aspirin 81 MG tablet Take 81 mg by mouth daily.    Marland Kitchen atorvastatin (LIPITOR) 40 MG tablet Take 1 tablet (40 mg total) by mouth at bedtime. 90 tablet 2  . B-D UF III MINI PEN NEEDLES 31G X 5 MM MISC USE ONCE DAILY 100 each 9  . empagliflozin (JARDIANCE) 10 MG TABS tablet Take 1 tablet (10 mg total) by mouth daily. 90 tablet 1  . glucose blood (ONE TOUCH TEST STRIPS) test strip Pt testing one time a day. 100 each 3  . hydrOXYzine (VISTARIL) 25 MG capsule TAKE ONE CAPSULE BY MOUTH THREE TIMES A DAY AS NEEDED 30 capsule 1  . Insulin Glargine (BASAGLAR KWIKPEN) 100 UNIT/ML Inject 38 Units into the skin at bedtime. 45 mL 0  . lisinopril (ZESTRIL) 20 MG tablet TAKE ONE TABLET BY MOUTH DAILY 90 tablet 1  . metFORMIN (GLUCOPHAGE) 1000 MG tablet Take 1 tablet (1,000 mg total) by mouth 2 (two) times daily with a meal. 180 tablet 1  . Misc. Devices MISC New CPAP machine with mask and supplies through [facility].  CPAP therapy at 14 cm. water pressure.    . ONE TOUCH ULTRA TEST test strip TEST as directed 100 each 3  . PARoxetine (PAXIL) 20 MG tablet Take 1.5 tablets (30 mg total) by mouth daily. Dose increased 09/29/20 to 30mg  45 tablet 0  . traZODone (DESYREL) 50 MG tablet Take 1 tablet (50 mg total) by mouth at bedtime. 90 tablet 1   No current facility-administered medications for this visit.      Psychiatric Specialty Exam: Review of Systems  Cardiovascular: Negative for chest pain.  Psychiatric/Behavioral: Positive for depression. Negative for suicidal ideas.    There were no vitals  taken for this visit.There is no height or weight on file to calculate BMI.  General Appearance:   Eye Contact:    Speech:  Slow  Volume:  Decreased  Mood: subdued  Affect:  congruent  Thought Process:  Goal Directed  Orientation:  Full (Time, Place, and Person)  Thought Content:  Rumination  Suicidal Thoughts:  No  Homicidal Thoughts:  No  Memory:  Immediate;   Fair Recent;  Fair  Judgement:  Poor  Insight:  Shallow  Psychomotor Activity:  Decreased  Concentration:  Concentration: Fair and Attention Span: Fair  Recall:  [employer] of Knowledge:Fair  Language: Fair  Akathisia:  Negative  Handed:  Right  AIMS (if indicated):  0  Assets:  Desire for Improvement  ADL's:  Intact  Cognition: WNL  Sleep:  fair    Treatment Plan Summary: Medication management and Plan as follows   1. Major depression moderate recurrent:  Subdued , start prozac 10mg , call in 2 weeks if wants to increase or earlier for concerns as he still wants to fu in 18m. 2.GAD: fluctuates, start prozac, on xanax prn as well 3. Panic attacks:sporadic, on xanax, avoid nicotine, start prozac   Work on distractions and add activities Fu 11m or earlier if needed, call for refills when due 1m, MD 3/15/20228:41 AM

## 2021-02-06 ENCOUNTER — Ambulatory Visit: Payer: Medicare HMO | Admitting: Family Medicine

## 2021-02-07 ENCOUNTER — Telehealth (HOSPITAL_COMMUNITY): Payer: Self-pay | Admitting: Psychiatry

## 2021-02-07 MED ORDER — ALPRAZOLAM 0.5 MG PO TABS: ORAL_TABLET | ORAL | 0 refills | Status: DC

## 2021-02-07 NOTE — Telephone Encounter (Signed)
sent 

## 2021-02-07 NOTE — Telephone Encounter (Signed)
Per pt Needs refill on xanax [facility]

## 2021-02-14 ENCOUNTER — Encounter: Payer: Self-pay | Admitting: Family Medicine

## 2021-02-14 ENCOUNTER — Other Ambulatory Visit: Payer: Self-pay

## 2021-02-14 ENCOUNTER — Ambulatory Visit (INDEPENDENT_AMBULATORY_CARE_PROVIDER_SITE_OTHER): Payer: Medicare HMO | Admitting: Family Medicine

## 2021-02-14 VITALS — BP 128/68 | HR 84 | Ht 66.0 in | Wt 180.0 lb

## 2021-02-14 DIAGNOSIS — Z794 Long term (current) use of insulin: Secondary | ICD-10-CM

## 2021-02-14 DIAGNOSIS — E11630 Type 2 diabetes mellitus with periodontal disease: Secondary | ICD-10-CM

## 2021-02-14 DIAGNOSIS — F411 Generalized anxiety disorder: Secondary | ICD-10-CM

## 2021-02-14 DIAGNOSIS — Z5320 Procedure and treatment not carried out because of patient's decision for unspecified reasons: Secondary | ICD-10-CM

## 2021-02-14 DIAGNOSIS — F1010 Alcohol abuse, uncomplicated: Secondary | ICD-10-CM | POA: Diagnosis not present

## 2021-02-14 DIAGNOSIS — F332 Major depressive disorder, recurrent severe without psychotic features: Secondary | ICD-10-CM

## 2021-02-14 DIAGNOSIS — I10 Essential (primary) hypertension: Secondary | ICD-10-CM | POA: Diagnosis not present

## 2021-02-14 LAB — POCT GLYCOSYLATED HEMOGLOBIN (HGB A1C): Hemoglobin A1C: 6.1 % — AB (ref 4.0–5.6)

## 2021-02-14 MED ORDER — METFORMIN HCL 1000 MG PO TABS: 1000.0000 mg | ORAL_TABLET | Freq: Two times a day (BID) | ORAL | 1 refills | Status: DC

## 2021-02-14 MED ORDER — EMPAGLIFLOZIN 10 MG PO TABS: 10.0000 mg | ORAL_TABLET | Freq: Every day | ORAL | 1 refills | Status: DC

## 2021-02-14 NOTE — Assessment & Plan Note (Signed)
Well controlled. Continue current regimen. Follow up in  6 mo  

## 2021-02-14 NOTE — Assessment & Plan Note (Addendum)
Having frequent panic attacks but discontinued his Paxil about 2 weeks ago because he felt like it really was not helping.  He really ramped up his alcohol intake as well.  Offered to get him help.  I also think he would benefit from genetic testing for psychiatric medications as he has been on multiple over his lifetime including lithium, Depakote etc. see list above in the HPI.  We discussed that he really might be a great candidate for GeneSight testing and that could help a psychiatrist focus on drugs that might be more likely to be effective.

## 2021-02-14 NOTE — Assessment & Plan Note (Addendum)
He is currently drinking heavily.  We discussed that this really is not improving his anxiety and really encouraged him to work on cutting back.  We will check liver enzymes.

## 2021-02-14 NOTE — Progress Notes (Addendum)
Established Patient Office Visit  Subjective:  Patient ID: Gabriel Gilbert, male    DOB: 1951/07/19  Age: 70 y.o. MRN: [id number]  CC:  Chief Complaint  Patient presents with  . Diabetes  . Hypertension    HPI [name] presents for   Diabetes - no hypoglycemic events. No wounds or sores that are not healing well. No increased thirst or urination. Checking glucose at home. Taking medications as prescribed without any side effects. He declined foot exam today. DEclines statin today.   Hypertension- Pt denies chest pain, SOB, dizziness, or heart palpitations.  Taking meds as directed w/o problems.  Denies medication side effects.    F/U Mood - says his anxiety got so bad that about a month ago he really ramped up his drinking. Now drinking a pint a day of vodka.  Then about 2 weeks ago he stopped his Paxil because her felt it really wasn't helping him.  He has been really frustrated over the years because he feels like he has not really been able to find medication that is super helpful for him he has tried alprazolam, Wellbutrin, citalopram, doxepin, duloxetine, Rexulti, mirtazapine, paroxetine, sertraline, trazodone, venlafaxine, and Trintellix.  He also reports a chronic cough.  No fevers or chills.  He does get screened for lung cancer yearly through his pulmonologist.   Past Medical History:  Diagnosis Date  . Bell's palsy 12-10  . Depression   . Diabetes mellitus    type 2  . Hyperlipidemia   . Hypertension     Past Surgical History:  Procedure Laterality Date  . CATARACT EXTRACTION Left   . kidney stone removed    . pfts     normal    Family History  Problem Relation Age of Onset  . COPD Mother   . Hypertension Mother   . Alzheimer's disease Mother        ?  . Diabetes Father   . COPD Father   . Drug abuse Son   . COPD Sister   . Cancer Sister        lung    Social History   Socioeconomic History  . Marital status: Single    Spouse name: Not on  file  . Number of children: Not on file  . Years of education: Not on file  . Highest education level: Not on file  Occupational History  . Not on file  Tobacco Use  . Smoking status: Current Every Day Smoker    Packs/day: 1.00    Years: 25.00    Pack years: 25.00    Types: Cigarettes    Last attempt to quit: 02/10/2013    Years since quitting: 8.0  . Smokeless tobacco: Never Used  . Tobacco comment: using electronic cigarettes  Vaping Use  . Vaping Use: Every day  Substance and Sexual Activity  . Alcohol use: No    Alcohol/week: 0.0 standard drinks  . Drug use: No  . Sexual activity: Never    Partners: Female  Other Topics Concern  . Not on file  Social History Narrative  . Not on file   Social Determinants of Health   Financial Resource Strain: Not on file  Food Insecurity: Not on file  Transportation Needs: Not on file  Physical Activity: Not on file  Stress: Not on file  Social Connections: Not on file  Intimate Partner Violence: Not on file    Outpatient Medications Prior to Visit  Medication Sig Dispense Refill  .  aspirin 81 MG tablet Take 81 mg by mouth daily.    . B-D UF III MINI PEN NEEDLES 31G X 5 MM MISC USE ONCE DAILY 100 each 9  . FLUoxetine (PROZAC) 10 MG capsule Take 1 capsule (10 mg total) by mouth daily. 30 capsule 2  . glucose blood (ONE TOUCH TEST STRIPS) test strip Pt testing one time a day. 100 each 3  . Insulin Glargine (BASAGLAR KWIKPEN) 100 UNIT/ML Inject 38 Units into the skin at bedtime. 45 mL 0  . Misc. Devices MISC New CPAP machine with mask and supplies through [facility].  CPAP therapy at 14 cm. water pressure.    . ONE TOUCH ULTRA TEST test strip TEST as directed 100 each 3  . ALPRAZolam (XANAX) 0.5 MG tablet TAKE ONE TABLET BY MOUTH DAILY AS NEEDED 30 tablet 0  . empagliflozin (JARDIANCE) 10 MG TABS tablet Take 1 tablet (10 mg total) by mouth daily. 90 tablet 1  . lisinopril (ZESTRIL) 20 MG tablet TAKE ONE TABLET BY MOUTH DAILY  90 tablet 1  . metFORMIN (GLUCOPHAGE) 1000 MG tablet Take 1 tablet (1,000 mg total) by mouth 2 (two) times daily with a meal. 180 tablet 1  . atorvastatin (LIPITOR) 40 MG tablet Take 1 tablet (40 mg total) by mouth at bedtime. 90 tablet 2  . hydrOXYzine (VISTARIL) 25 MG capsule TAKE ONE CAPSULE BY MOUTH THREE TIMES A DAY AS NEEDED 30 capsule 1  . PARoxetine (PAXIL) 20 MG tablet Take 1.5 tablets (30 mg total) by mouth daily. Dose increased 09/29/20 to 30mg  45 tablet 0  . traZODone (DESYREL) 50 MG tablet Take 1 tablet (50 mg total) by mouth at bedtime. 90 tablet 1   No facility-administered medications prior to visit.    No Known Allergies  ROS Review of Systems    Objective:    Physical Exam Constitutional:      Appearance: He is well-developed.  HENT:     Head: Normocephalic and atraumatic.  Cardiovascular:     Rate and Rhythm: Normal rate and regular rhythm.     Heart sounds: Normal heart sounds.  Pulmonary:     Effort: Pulmonary effort is normal.     Breath sounds: Normal breath sounds.  Skin:    General: Skin is warm and dry.  Neurological:     Mental Status: He is alert and oriented to person, place, and time.  Psychiatric:        Behavior: Behavior normal.     BP 128/68   Pulse 84   Ht 5\' 6"  (1.676 m)   Wt 180 lb (81.6 kg)   SpO2 97%   BMI 29.05 kg/m  Wt Readings from Last 3 Encounters:  02/14/21 180 lb (81.6 kg)  11/17/20 183 lb (83 kg)  10/10/20 181 lb (82.1 kg)     There are no preventive care reminders to display for this patient.  There are no preventive care reminders to display for this patient.  Lab Results  Component Value Date   TSH 1.580 01/25/2015   Lab Results  Component Value Date   WBC 7.2 02/14/2021   HGB 19.1 (H) 02/14/2021   HCT 54.4 (H) 02/14/2021   MCV 98.9 02/14/2021   PLT 218 02/14/2021   Lab Results  Component Value Date   NA 138 02/14/2021   K 4.9 02/14/2021   CO2 21 02/14/2021   GLUCOSE 91 02/14/2021   BUN 14  02/14/2021   CREATININE 0.76 02/14/2021   BILITOT 0.4  02/14/2021   ALKPHOS 61 06/06/2017   AST 18 02/14/2021   ALT 15 02/14/2021   PROT 6.8 02/14/2021   ALBUMIN 4.1 06/06/2017   CALCIUM 10.1 02/14/2021   Lab Results  Component Value Date   CHOL 160 10/17/2020   Lab Results  Component Value Date   HDL 48 10/17/2020   Lab Results  Component Value Date   LDLCALC 84 10/17/2020   Lab Results  Component Value Date   TRIG 190 (H) 10/17/2020   Lab Results  Component Value Date   CHOLHDL 3.3 10/17/2020   Lab Results  Component Value Date   HGBA1C 6.1 (A) 02/14/2021      Assessment & Plan:   Problem List Items Addressed This Visit      Cardiovascular and Mediastinum   Essential hypertension, benign - Primary    Well controlled. Continue current regimen. Follow up in  6 mo       Relevant Orders   COMPLETE METABOLIC PANEL WITH GFR (Completed)   CBC (Completed)     Endocrine   Type 2 diabetes mellitus with periodontal complication (HCC)    Well controlled. Continue current regimen. Follow up in  4 mo refuses statin.        Relevant Medications   empagliflozin (JARDIANCE) 10 MG TABS tablet   metFORMIN (GLUCOPHAGE) 1000 MG tablet   Other Relevant Orders   POCT glycosylated hemoglobin (Hb A1C) (Completed)   COMPLETE METABOLIC PANEL WITH GFR (Completed)   CBC (Completed)     Other   Severe episode of recurrent major depressive disorder (HCC)   Generalized anxiety disorder    Having frequent panic attacks but discontinued his Paxil about 2 weeks ago because he felt like it really was not helping.  He really ramped up his alcohol intake as well.  Offered to get him help.  I also think he would benefit from genetic testing for psychiatric medications as he has been on multiple over his lifetime including lithium, Depakote etc. see list above in the HPI.  We discussed that he really might be a great candidate for GeneSight testing and that could help a psychiatrist focus  on drugs that might be more likely to be effective.      Relevant Orders   Ambulatory referral to Behavioral Health   Alcohol abuse    He is currently drinking heavily.  We discussed that this really is not improving his anxiety and really encouraged him to work on cutting back.  We will check liver enzymes.       Other Visit Diagnoses    Statin declined          Meds ordered this encounter  Medications  . empagliflozin (JARDIANCE) 10 MG TABS tablet    Sig: Take 1 tablet (10 mg total) by mouth daily.    Dispense:  90 tablet    Refill:  1  . metFORMIN (GLUCOPHAGE) 1000 MG tablet    Sig: Take 1 tablet (1,000 mg total) by mouth 2 (two) times daily with a meal.    Dispense:  180 tablet    Refill:  1    Follow-up: Return in about 3 months (around 05/16/2021) for Diabetes follow-up.    Nani Gasser, MD

## 2021-02-14 NOTE — Patient Instructions (Signed)
I will check on the genetic testing

## 2021-02-14 NOTE — Progress Notes (Signed)
Pt declined to do diabetic foot exam today.  He is injecting 15U of Hospital doctor

## 2021-02-14 NOTE — Assessment & Plan Note (Addendum)
Well controlled. Continue current regimen. Follow up in  4 mo refuses statin.

## 2021-02-15 ENCOUNTER — Other Ambulatory Visit: Payer: Self-pay | Admitting: *Deleted

## 2021-02-15 ENCOUNTER — Telehealth: Payer: Self-pay | Admitting: *Deleted

## 2021-02-15 DIAGNOSIS — D582 Other hemoglobinopathies: Secondary | ICD-10-CM

## 2021-02-15 DIAGNOSIS — F1010 Alcohol abuse, uncomplicated: Secondary | ICD-10-CM

## 2021-02-15 LAB — COMPLETE METABOLIC PANEL WITH GFR
AG Ratio: 2.1 (calc) (ref 1.0–2.5)
ALT: 15 U/L (ref 9–46)
AST: 18 U/L (ref 10–35)
Albumin: 4.6 g/dL (ref 3.6–5.1)
Alkaline phosphatase (APISO): 62 U/L (ref 35–144)
BUN: 14 mg/dL (ref 7–25)
CO2: 21 mmol/L (ref 20–32)
Calcium: 10.1 mg/dL (ref 8.6–10.3)
Chloride: 103 mmol/L (ref 98–110)
Creat: 0.76 mg/dL (ref 0.70–1.25)
GFR, Est African American: 109 mL/min/{1.73_m2} (ref >=60)
GFR, Est Non African American: 94 mL/min/{1.73_m2} (ref >=60)
Globulin: 2.2 g/dL (calc) (ref 1.9–3.7)
Glucose, Bld: 91 mg/dL (ref 65–99)
Potassium: 4.9 mmol/L (ref 3.5–5.3)
Sodium: 138 mmol/L (ref 135–146)
Total Bilirubin: 0.4 mg/dL (ref 0.2–1.2)
Total Protein: 6.8 g/dL (ref 6.1–8.1)

## 2021-02-15 LAB — CBC
HCT: 54.4 % — ABNORMAL HIGH (ref 38.5–50.0)
Hemoglobin: 19.1 g/dL — ABNORMAL HIGH (ref 13.2–17.1)
MCH: 34.7 pg — ABNORMAL HIGH (ref 27.0–33.0)
MCHC: 35.1 g/dL (ref 32.0–36.0)
MCV: 98.9 fL (ref 80.0–100.0)
MPV: 11.3 fL (ref 7.5–12.5)
Platelets: 218 10*3/uL (ref 140–400)
RBC: 5.5 10*6/uL (ref 4.20–5.80)
RDW: 14 % (ref 11.0–15.0)
WBC: 7.2 10*3/uL (ref 3.8–10.8)

## 2021-02-15 NOTE — Telephone Encounter (Signed)
Order placed

## 2021-02-15 NOTE — Telephone Encounter (Signed)
Pt called back and asked that he get a referral for his Alcohol Addiction.   Will route to pcp for referral

## 2021-02-16 DIAGNOSIS — F339 Major depressive disorder, recurrent, unspecified: Secondary | ICD-10-CM | POA: Diagnosis not present

## 2021-02-16 DIAGNOSIS — F332 Major depressive disorder, recurrent severe without psychotic features: Secondary | ICD-10-CM | POA: Diagnosis not present

## 2021-02-16 DIAGNOSIS — F411 Generalized anxiety disorder: Secondary | ICD-10-CM | POA: Diagnosis not present

## 2021-02-17 ENCOUNTER — Other Ambulatory Visit: Payer: Self-pay

## 2021-02-17 MED ORDER — LISINOPRIL 20 MG PO TABS: 1.0000 | ORAL_TABLET | Freq: Every day | ORAL | 1 refills | Status: DC

## 2021-02-20 ENCOUNTER — Telehealth (HOSPITAL_COMMUNITY): Payer: Self-pay | Admitting: Licensed Clinical Social Worker

## 2021-02-27 ENCOUNTER — Telehealth: Payer: Self-pay

## 2021-02-27 NOTE — Telephone Encounter (Signed)
Gabriel Gilbert called and wanted to know if Dr [name] would write a prescription for more Xanax. He states he took more than normal because he is trying not to drink. I did advise him Dr [name] is not the prescribing provider for this medication. He states he is aware but was hoping she could help.   He is aware Dr [name] is not in the office until tomorrow.

## 2021-02-28 NOTE — Telephone Encounter (Signed)
NO  iwill not write the medication.  Because it is scheduled drug has to come from prescriber. Did [facility] get in touch with him about alcohol use?

## 2021-03-01 ENCOUNTER — Ambulatory Visit: Payer: Medicare HMO | Admitting: Family Medicine

## 2021-03-01 NOTE — Telephone Encounter (Signed)
I changed his referral to [facility] since [facility] never reached out to him.  - CF

## 2021-03-01 NOTE — Telephone Encounter (Signed)
Please check on his [facility] referral.   I tried to talk with him about his referral. He was very upset just wanting xanax. He hung up.

## 2021-03-08 ENCOUNTER — Telehealth (HOSPITAL_COMMUNITY): Payer: Self-pay

## 2021-03-08 MED ORDER — ALPRAZOLAM 0.5 MG PO TABS: ORAL_TABLET | ORAL | 0 refills | Status: DC

## 2021-03-08 NOTE — Telephone Encounter (Signed)
Patient is requesting a refill on Alprazolam. [facility] [place]

## 2021-03-08 NOTE — Telephone Encounter (Signed)
sent 

## 2021-03-10 ENCOUNTER — Telehealth: Payer: Self-pay | Admitting: *Deleted

## 2021-03-10 ENCOUNTER — Telehealth: Payer: Self-pay | Admitting: Family Medicine

## 2021-03-10 NOTE — Telephone Encounter (Addendum)
lilliam would like a return call to discuss the genesight testing that was performed on Gabriel Gilbert.  She said that we can call back to get this set up and provide a phone number that Dr. [name]  Can be reached to do this.   Phone: [phone]

## 2021-03-10 NOTE — Telephone Encounter (Signed)
genesight results faxed to Dr. Gilmore Laroche and [facility] along with his last office visit notes for review.

## 2021-03-10 NOTE — Telephone Encounter (Signed)
OK to send OV note from earlier this month.

## 2021-03-10 NOTE — Telephone Encounter (Signed)
Pt advised of results and he is ok with this being sent to Dr. Gilmore Laroche.

## 2021-03-10 NOTE — Telephone Encounter (Signed)
Call patient: We did receive his GeneSight testing back.  It recommends a couple of different drugs based on your genetic testing which you should have a good effect with.  This would include Pristiq, Fetzima, venlafaxine, and Viibryd.  I would like to send a copy of this to Dr. Gilmore Laroche I think it might be helpful for him and directing you in your medication management.  If you are okay with that please let us know

## 2021-03-29 DIAGNOSIS — F332 Major depressive disorder, recurrent severe without psychotic features: Secondary | ICD-10-CM | POA: Diagnosis not present

## 2021-04-05 DIAGNOSIS — F332 Major depressive disorder, recurrent severe without psychotic features: Secondary | ICD-10-CM | POA: Diagnosis not present

## 2021-04-06 DIAGNOSIS — F332 Major depressive disorder, recurrent severe without psychotic features: Secondary | ICD-10-CM | POA: Diagnosis not present

## 2021-04-07 DIAGNOSIS — F332 Major depressive disorder, recurrent severe without psychotic features: Secondary | ICD-10-CM | POA: Diagnosis not present

## 2021-04-11 ENCOUNTER — Telehealth (HOSPITAL_COMMUNITY): Payer: Self-pay

## 2021-04-11 MED ORDER — ALPRAZOLAM 0.5 MG PO TABS: ORAL_TABLET | ORAL | 0 refills | Status: DC

## 2021-04-11 NOTE — Telephone Encounter (Signed)
Ok I sent.

## 2021-04-11 NOTE — Telephone Encounter (Signed)
Medication refill request - Fax received from pt's [facility] for a refill of his prescribed Alprazolam 0.5 mg, last written and filled on 03/08/21 with no refills. Pt's next appointment scheduled for 04/21/21.

## 2021-04-12 DIAGNOSIS — F332 Major depressive disorder, recurrent severe without psychotic features: Secondary | ICD-10-CM | POA: Diagnosis not present

## 2021-04-13 DIAGNOSIS — F332 Major depressive disorder, recurrent severe without psychotic features: Secondary | ICD-10-CM | POA: Diagnosis not present

## 2021-04-14 DIAGNOSIS — F332 Major depressive disorder, recurrent severe without psychotic features: Secondary | ICD-10-CM | POA: Diagnosis not present

## 2021-04-17 ENCOUNTER — Telehealth: Payer: Self-pay | Admitting: *Deleted

## 2021-04-17 DIAGNOSIS — Z794 Long term (current) use of insulin: Secondary | ICD-10-CM

## 2021-04-17 DIAGNOSIS — E11630 Type 2 diabetes mellitus with periodontal disease: Secondary | ICD-10-CM

## 2021-04-17 DIAGNOSIS — F332 Major depressive disorder, recurrent severe without psychotic features: Secondary | ICD-10-CM | POA: Diagnosis not present

## 2021-04-17 MED ORDER — TOUJEO SOLOSTAR 300 UNIT/ML ~~LOC~~ SOPN: 38.0000 [IU] | PEN_INJECTOR | Freq: Every day | SUBCUTANEOUS | 2 refills | Status: DC

## 2021-04-17 NOTE — Telephone Encounter (Signed)
New prescription sent to pharmacy for Toujeo.  If costly then I would encourage him to call his insurance to find out what else might be covered.

## 2021-04-17 NOTE — Telephone Encounter (Signed)
Pt called and said that his insurance will no longer cover the Basaglar.   I told him that we don't know what is covered and that he will need to call his insurance company to see what they will cover he said that she should just call something in. I told him that if we did that it my be rejected and that it would be better for him to find out what is covered. Pt didn't seem to be very concerned about this and informed me that he is actually out of insulin.   Will fwd to pcp for recommendations

## 2021-04-18 DIAGNOSIS — F332 Major depressive disorder, recurrent severe without psychotic features: Secondary | ICD-10-CM | POA: Diagnosis not present

## 2021-04-18 NOTE — Telephone Encounter (Signed)
Pt called and advised of recommendations.   He said that he spoke to his insurance company yesterday and was told that he should speak to his doctor about what he will need to take. He informed her that he was told to contact his insurance company. He was then advised to have this mailed to him. He said that he does not want his medication mailed and would like to pick up at his pharmacy.

## 2021-04-19 DIAGNOSIS — F332 Major depressive disorder, recurrent severe without psychotic features: Secondary | ICD-10-CM | POA: Diagnosis not present

## 2021-04-20 DIAGNOSIS — F332 Major depressive disorder, recurrent severe without psychotic features: Secondary | ICD-10-CM | POA: Diagnosis not present

## 2021-04-21 ENCOUNTER — Encounter (HOSPITAL_COMMUNITY): Payer: Self-pay | Admitting: Psychiatry

## 2021-04-21 ENCOUNTER — Telehealth (INDEPENDENT_AMBULATORY_CARE_PROVIDER_SITE_OTHER): Payer: Medicare HMO | Admitting: Psychiatry

## 2021-04-21 DIAGNOSIS — F411 Generalized anxiety disorder: Secondary | ICD-10-CM | POA: Diagnosis not present

## 2021-04-21 DIAGNOSIS — F332 Major depressive disorder, recurrent severe without psychotic features: Secondary | ICD-10-CM | POA: Diagnosis not present

## 2021-04-21 NOTE — Progress Notes (Signed)
[facility] Outpatient Follow up visit  Telepsych Patient Identification: Gabriel Gilbert MRN:  [id number] Date of Evaluation:  04/21/2021 Referral Source: primary care.  Chief Complaint:    anxiety Visit Diagnosis:    ICD-10-CM   1. GAD (generalized anxiety disorder)  F41.1     Virtual Visit via Telephone Note  I connected with Gabriel Gilbert on 04/21/21 at 10:30 AM EDT by telephone and verified that I am speaking with the correct person using two identifiers.  Location: Patient: home Provider: home offic   I discussed the limitations, risks, security and privacy concerns of performing an evaluation and management service by telephone and the availability of in person appointments. I also discussed with the patient that there may be a patient responsible charge related to this service. The patient expressed understanding and agreed to proceed.     I discussed the assessment and treatment plan with the patient. The patient was provided an opportunity to ask questions and all were answered. The patient agreed with the plan and demonstrated an understanding of the instructions.   The patient was advised to call back or seek an in-person evaluation if the symptoms worsen or if the condition fails to improve as anticipated.  I provided 11 minutes of non-face-to-face time during this encounter.    HPI:  Doing fair status at home mostly Does not want to be on antidepressants as an extra [place] to help with anxiety he has tried Paxil and Prozac before Being lonely and a motivation because of sadness but overall not hopeless or any change  Discussed motivation and to go out to more chores outside follow-up with his primary care physician    Aggravating factor: lonliness ,modifying factor: dog,  Some friends.  Timing in morning   Past Psychiatric History: depression ,anxiety  Previous Psychotropic Medications: Yes   Substance Abuse History in the last 12 months:  No.  Consequences of  Substance Abuse: NA  Past Medical History:  Past Medical History:  Diagnosis Date   Bell's palsy 12-10   Depression    Diabetes mellitus    type 2   Hyperlipidemia    Hypertension     Past Surgical History:  Procedure Laterality Date   CATARACT EXTRACTION Left    kidney stone removed     pfts     normal    Family Psychiatric History: anxiety. Mom used valium  Family History:  Family History  Problem Relation Age of Onset   COPD Mother    Hypertension Mother    Alzheimer's disease Mother        ?   Diabetes Father    COPD Father    Drug abuse Son    COPD Sister    Cancer Sister        lung    Social History:   Social History   Socioeconomic History   Marital status: Single    Spouse name: Not on file   Number of children: Not on file   Years of education: Not on file   Highest education level: Not on file  Occupational History   Not on file  Tobacco Use   Smoking status: Every Day    Packs/day: 1.00    Years: 25.00    Pack years: 25.00    Types: Cigarettes    Last attempt to quit: 02/10/2013    Years since quitting: 8.1   Smokeless tobacco: Never   Tobacco comments:    using electronic cigarettes  Vaping Use  Vaping Use: Every day  Substance and Sexual Activity   Alcohol use: No    Alcohol/week: 0.0 standard drinks   Drug use: No   Sexual activity: Never    Partners: Female  Other Topics Concern   Not on file  Social History Narrative   Not on file   Social Determinants of Health   Financial Resource Strain: Not on file  Food Insecurity: Not on file  Transportation Needs: Not on file  Physical Activity: Not on file  Stress: Not on file  Social Connections: Not on file      Allergies:  No Known Allergies  Metabolic Disorder Labs: Lab Results  Component Value Date   HGBA1C 6.1 (A) 02/14/2021   No results found for: PROLACTIN Lab Results  Component Value Date   CHOL 160 10/17/2020   TRIG 190 (H) 10/17/2020   HDL 48 10/17/2020    CHOLHDL 3.3 10/17/2020   VLDL 22 01/17/2016   LDLCALC 84 10/17/2020   LDLCALC 17 08/25/2019     Current Medications: Current Outpatient Medications  Medication Sig Dispense Refill   ALPRAZolam (XANAX) 0.5 MG tablet TAKE ONE TABLET BY MOUTH DAILY AS NEEDED 30 tablet 0   aspirin 81 MG tablet Take 81 mg by mouth daily.     B-D UF III MINI PEN NEEDLES 31G X 5 MM MISC USE ONCE DAILY 100 each 9   empagliflozin (JARDIANCE) 10 MG TABS tablet Take 1 tablet (10 mg total) by mouth daily. 90 tablet 1   FLUoxetine (PROZAC) 10 MG capsule Take 1 capsule (10 mg total) by mouth daily. 30 capsule 2   glucose blood (ONE TOUCH TEST STRIPS) test strip Pt testing one time a day. 100 each 3   insulin glargine, 1 Unit Dial, (TOUJEO SOLOSTAR) 300 UNIT/ML Solostar Pen Inject 38-40 Units into the skin at bedtime. 3 mL 2   lisinopril (ZESTRIL) 20 MG tablet Take 1 tablet (20 mg total) by mouth daily. 90 tablet 1   metFORMIN (GLUCOPHAGE) 1000 MG tablet Take 1 tablet (1,000 mg total) by mouth 2 (two) times daily with a meal. 180 tablet 1   Misc. Devices MISC New CPAP machine with mask and supplies through [facility].  CPAP therapy at 14 cm. water pressure.     ONE TOUCH ULTRA TEST test strip TEST as directed 100 each 3   No current facility-administered medications for this visit.      Psychiatric Specialty Exam: Review of Systems  Cardiovascular:  Negative for chest pain.  Psychiatric/Behavioral:  Positive for depression. Negative for suicidal ideas.    There were no vitals taken for this visit.There is no height or weight on file to calculate BMI.  General Appearance:   Eye Contact:    Speech:  Slow  Volume:  Decreased  Mood: Same for fair  Affect:  congruent  Thought Process:  Goal Directed  Orientation:  Full (Time, Place, and Person)  Thought Content:  Rumination  Suicidal Thoughts:  No  Homicidal Thoughts:  No  Memory:  Immediate;   Fair Recent;   Fair  Judgement:  Poor  Insight:   Shallow  Psychomotor Activity:  Decreased  Concentration:  Concentration: Fair and Attention Span: Fair  Recall:  [employer] of Knowledge:Fair  Language: Fair  Akathisia:  Negative  Handed:  Right  AIMS (if indicated):  0  Assets:  Desire for Improvement  ADL's:  Intact  Cognition: WNL  Sleep:  fair    Treatment Plan Summary: Medication  management and Plan as follows    1. Major depression moderate recurrent: Not worse manageable without medication does not want to be on antidepressants discussed to engage in activities  2.GAD: With panic attacks at times Xanax help continue as needed  Work on distractions and add activities Fu for 38m or earlier if needed, call for refills when due Thresa Ross, MD 6/10/202210:41 AM

## 2021-04-24 ENCOUNTER — Telehealth (HOSPITAL_COMMUNITY): Payer: Medicare HMO | Admitting: Psychiatry

## 2021-04-24 DIAGNOSIS — F332 Major depressive disorder, recurrent severe without psychotic features: Secondary | ICD-10-CM | POA: Diagnosis not present

## 2021-04-25 DIAGNOSIS — F332 Major depressive disorder, recurrent severe without psychotic features: Secondary | ICD-10-CM | POA: Diagnosis not present

## 2021-04-26 DIAGNOSIS — F332 Major depressive disorder, recurrent severe without psychotic features: Secondary | ICD-10-CM | POA: Diagnosis not present

## 2021-04-27 DIAGNOSIS — F332 Major depressive disorder, recurrent severe without psychotic features: Secondary | ICD-10-CM | POA: Diagnosis not present

## 2021-04-28 DIAGNOSIS — F332 Major depressive disorder, recurrent severe without psychotic features: Secondary | ICD-10-CM | POA: Diagnosis not present

## 2021-05-01 DIAGNOSIS — F332 Major depressive disorder, recurrent severe without psychotic features: Secondary | ICD-10-CM | POA: Diagnosis not present

## 2021-05-02 DIAGNOSIS — F332 Major depressive disorder, recurrent severe without psychotic features: Secondary | ICD-10-CM | POA: Diagnosis not present

## 2021-05-03 DIAGNOSIS — F332 Major depressive disorder, recurrent severe without psychotic features: Secondary | ICD-10-CM | POA: Diagnosis not present

## 2021-05-05 ENCOUNTER — Telehealth (HOSPITAL_COMMUNITY): Payer: Self-pay | Admitting: Psychiatry

## 2021-05-05 DIAGNOSIS — F332 Major depressive disorder, recurrent severe without psychotic features: Secondary | ICD-10-CM | POA: Diagnosis not present

## 2021-05-05 MED ORDER — ALPRAZOLAM 0.5 MG PO TABS: ORAL_TABLET | ORAL | 0 refills | Status: DC

## 2021-05-05 NOTE — Telephone Encounter (Signed)
Per pt He needs refill on xananx  He ran out too soon. He has been drinking a Pint of alcohol a day to compensate for running out.    [employer]

## 2021-05-08 DIAGNOSIS — F332 Major depressive disorder, recurrent severe without psychotic features: Secondary | ICD-10-CM | POA: Diagnosis not present

## 2021-05-10 DIAGNOSIS — F332 Major depressive disorder, recurrent severe without psychotic features: Secondary | ICD-10-CM | POA: Diagnosis not present

## 2021-05-11 DIAGNOSIS — F332 Major depressive disorder, recurrent severe without psychotic features: Secondary | ICD-10-CM | POA: Diagnosis not present

## 2021-05-12 ENCOUNTER — Telehealth: Payer: Self-pay | Admitting: Family Medicine

## 2021-05-12 DIAGNOSIS — F332 Major depressive disorder, recurrent severe without psychotic features: Secondary | ICD-10-CM | POA: Diagnosis not present

## 2021-05-12 NOTE — Chronic Care Management (AMB) (Signed)
  Chronic Care Management   Outreach Note  05/12/2021 Name: Gabriel Gilbert MRN: [id number] DOB: 30-Sep-1951  Referred by: [name], MD Reason for referral : Chronic Care Management   An unsuccessful telephone outreach was attempted today. The patient was referred to the pharmacist for assistance with care management and care coordination.   Follow Up Plan:   Carmell [place] [facility] Scheduler

## 2021-05-16 ENCOUNTER — Ambulatory Visit: Payer: Medicare HMO | Admitting: Family Medicine

## 2021-05-16 DIAGNOSIS — F332 Major depressive disorder, recurrent severe without psychotic features: Secondary | ICD-10-CM | POA: Diagnosis not present

## 2021-05-17 DIAGNOSIS — F332 Major depressive disorder, recurrent severe without psychotic features: Secondary | ICD-10-CM | POA: Diagnosis not present

## 2021-05-18 DIAGNOSIS — F332 Major depressive disorder, recurrent severe without psychotic features: Secondary | ICD-10-CM | POA: Diagnosis not present

## 2021-05-19 DIAGNOSIS — F332 Major depressive disorder, recurrent severe without psychotic features: Secondary | ICD-10-CM | POA: Diagnosis not present

## 2021-05-22 DIAGNOSIS — F332 Major depressive disorder, recurrent severe without psychotic features: Secondary | ICD-10-CM | POA: Diagnosis not present

## 2021-05-23 ENCOUNTER — Telehealth: Payer: Self-pay | Admitting: Family Medicine

## 2021-05-23 DIAGNOSIS — F332 Major depressive disorder, recurrent severe without psychotic features: Secondary | ICD-10-CM | POA: Diagnosis not present

## 2021-05-23 NOTE — Progress Notes (Signed)
errorr

## 2021-05-23 NOTE — Progress Notes (Signed)
  Chronic Care Management   Outreach Note  05/23/2021 Name: Gabriel Gilbert MRN: [id number] DOB: 1951-09-16  Referred by: [name], MD Reason for referral : No chief complaint on file.   A second unsuccessful telephone outreach was attempted today. The patient was referred to pharmacist for assistance with care management and care coordination.  Follow Up Plan:   Carmell [place] [facility] Scheduler

## 2021-05-24 ENCOUNTER — Other Ambulatory Visit: Payer: Self-pay

## 2021-05-24 ENCOUNTER — Encounter: Payer: Self-pay | Admitting: Family Medicine

## 2021-05-24 ENCOUNTER — Ambulatory Visit (INDEPENDENT_AMBULATORY_CARE_PROVIDER_SITE_OTHER): Payer: Medicare HMO | Admitting: Family Medicine

## 2021-05-24 VITALS — BP 135/61 | HR 95 | Ht 66.0 in | Wt 180.0 lb

## 2021-05-24 DIAGNOSIS — Z794 Long term (current) use of insulin: Secondary | ICD-10-CM | POA: Diagnosis not present

## 2021-05-24 DIAGNOSIS — E11630 Type 2 diabetes mellitus with periodontal disease: Secondary | ICD-10-CM | POA: Diagnosis not present

## 2021-05-24 DIAGNOSIS — F1010 Alcohol abuse, uncomplicated: Secondary | ICD-10-CM

## 2021-05-24 DIAGNOSIS — I10 Essential (primary) hypertension: Secondary | ICD-10-CM | POA: Diagnosis not present

## 2021-05-24 DIAGNOSIS — F332 Major depressive disorder, recurrent severe without psychotic features: Secondary | ICD-10-CM | POA: Diagnosis not present

## 2021-05-24 LAB — POCT GLYCOSYLATED HEMOGLOBIN (HGB A1C): Hemoglobin A1C: 7 % — AB (ref 4.0–5.6)

## 2021-05-24 MED ORDER — BUSPIRONE HCL 5 MG PO TABS
ORAL_TABLET | ORAL | 0 refills | Status: DC
Start: 2021-05-24 — End: 2021-07-31

## 2021-05-24 MED ORDER — TOUJEO SOLOSTAR 300 UNIT/ML ~~LOC~~ SOPN: 20.0000 [IU] | PEN_INJECTOR | Freq: Every day | SUBCUTANEOUS | 1 refills | Status: DC

## 2021-05-24 NOTE — Assessment & Plan Note (Signed)
A1c went up to 7.0 from previous of 6.1.  It sounds like he has had some dietary changes which are likely contributing we discussed getting back on track with low-carb and low sugar diet and really looking at portion control.  Also reducing his alcohol intake would be helpful as well in the meantime working to increase his long-acting insulin to 20 units he is only been using 15.  Follow-up in 3 months.

## 2021-05-24 NOTE — Progress Notes (Signed)
Established Patient Office Visit  Subjective:  Patient ID: Gabriel Gilbert, male    DOB: 09-17-1951  Age: 70 y.o. MRN: [id number]  CC:  Chief Complaint  Patient presents with   Diabetes    Taking 15 units of Toujeo    HPI Gabriel Gilbert presents for   Diabetes - no hypoglycemic events. No wounds or sores that are not healing well. No increased thirst or urination. Checking glucose at home. Taking medications as prescribed without any side effects. He reports he feels he has been eating more.  Currently using 15 units of Toujeo but he also says he received a letter in the mail saying that the Toujeo would no longer be covered with his current insurance plan.  He is getting [facility] therapy at [facility]. He is not sure it is really helping him yet. Though he is still drinking alcohol  He would like to try Buspar. He feels it helps him not drink.  He says he tried to reach out to his psychiatrist but did not get a response from them he said to leave several messages.  Hypertension- Pt denies chest pain, SOB, dizziness, or heart palpitations.  Taking meds as directed w/o problems.  Denies medication side effects.     Past Medical History:  Diagnosis Date   Bell's palsy 12-10   Depression    Diabetes mellitus    type 2   Hyperlipidemia    Hypertension     Past Surgical History:  Procedure Laterality Date   CATARACT EXTRACTION Left    kidney stone removed     pfts     normal    Family History  Problem Relation Age of Onset   COPD Mother    Hypertension Mother    Alzheimer's disease Mother        ?   Diabetes Father    COPD Father    Drug abuse Son    COPD Sister    Cancer Sister        lung    Social History   Socioeconomic History   Marital status: Single    Spouse name: Not on file   Number of children: Not on file   Years of education: Not on file   Highest education level: Not on file  Occupational History   Not on file  Tobacco Use   Smoking status:  Every Day    Packs/day: 1.00    Years: 25.00    Pack years: 25.00    Types: Cigarettes    Last attempt to quit: 02/10/2013    Years since quitting: 8.2   Smokeless tobacco: Never   Tobacco comments:    using electronic cigarettes  Vaping Use   Vaping Use: Every day  Substance and Sexual Activity   Alcohol use: No    Alcohol/week: 0.0 standard drinks   Drug use: No   Sexual activity: Never    Partners: Female  Other Topics Concern   Not on file  Social History Narrative   Not on file   Social Determinants of Health   Financial Resource Strain: Not on file  Food Insecurity: Not on file  Transportation Needs: Not on file  Physical Activity: Not on file  Stress: Not on file  Social Connections: Not on file  Intimate Partner Violence: Not on file    Outpatient Medications Prior to Visit  Medication Sig Dispense Refill   ALPRAZolam (XANAX) 0.5 MG tablet TAKE ONE TABLET BY MOUTH DAILY AS NEEDED  30 tablet 0   aspirin 81 MG tablet Take 81 mg by mouth daily.     B-D UF III MINI PEN NEEDLES 31G X 5 MM MISC USE ONCE DAILY 100 each 9   empagliflozin (JARDIANCE) 10 MG TABS tablet Take 1 tablet (10 mg total) by mouth daily. 90 tablet 1   FLUoxetine (PROZAC) 10 MG capsule Take 1 capsule (10 mg total) by mouth daily. 30 capsule 2   glucose blood (ONE TOUCH TEST STRIPS) test strip Pt testing one time a day. 100 each 3   lisinopril (ZESTRIL) 20 MG tablet Take 1 tablet (20 mg total) by mouth daily. 90 tablet 1   metFORMIN (GLUCOPHAGE) 1000 MG tablet Take 1 tablet (1,000 mg total) by mouth 2 (two) times daily with a meal. 180 tablet 1   Misc. Devices MISC New CPAP machine with mask and supplies through [facility].  CPAP therapy at 14 cm. water pressure.     ONE TOUCH ULTRA TEST test strip TEST as directed 100 each 3   insulin glargine, 1 Unit Dial, (TOUJEO SOLOSTAR) 300 UNIT/ML Solostar Pen Inject 38-40 Units into the skin at bedtime. 3 mL 2   No facility-administered medications  prior to visit.    No Known Allergies  ROS Review of Systems    Objective:    Physical Exam Constitutional:      Appearance: Normal appearance. He is well-developed.  HENT:     Head: Normocephalic and atraumatic.  Cardiovascular:     Rate and Rhythm: Normal rate and regular rhythm.     Heart sounds: Normal heart sounds.  Pulmonary:     Effort: Pulmonary effort is normal.     Breath sounds: Normal breath sounds.  Skin:    General: Skin is warm and dry.  Neurological:     Mental Status: He is alert and oriented to person, place, and time. Mental status is at baseline.  Psychiatric:        Behavior: Behavior normal.    BP 135/61   Pulse 95   Ht 5\' 6"  (1.676 m)   Wt 180 lb (81.6 kg)   SpO2 98%   BMI 29.05 kg/m  Wt Readings from Last 3 Encounters:  05/24/21 180 lb (81.6 kg)  02/14/21 180 lb (81.6 kg)  11/17/20 183 lb (83 kg)     Health Maintenance Due  Topic Date Due   COVID-19 Vaccine (3 - Moderna risk series) 04/13/2020    There are no preventive care reminders to display for this patient.  Lab Results  Component Value Date   TSH 1.580 01/25/2015   Lab Results  Component Value Date   WBC 7.2 02/14/2021   HGB 19.1 (H) 02/14/2021   HCT 54.4 (H) 02/14/2021   MCV 98.9 02/14/2021   PLT 218 02/14/2021   Lab Results  Component Value Date   NA 138 02/14/2021   K 4.9 02/14/2021   CO2 21 02/14/2021   GLUCOSE 91 02/14/2021   BUN 14 02/14/2021   CREATININE 0.76 02/14/2021   BILITOT 0.4 02/14/2021   ALKPHOS 61 06/06/2017   AST 18 02/14/2021   ALT 15 02/14/2021   PROT 6.8 02/14/2021   ALBUMIN 4.1 06/06/2017   CALCIUM 10.1 02/14/2021   Lab Results  Component Value Date   CHOL 160 10/17/2020   Lab Results  Component Value Date   HDL 48 10/17/2020   Lab Results  Component Value Date   LDLCALC 84 10/17/2020   Lab Results  Component Value  Date   TRIG 190 (H) 10/17/2020   Lab Results  Component Value Date   CHOLHDL 3.3 10/17/2020   Lab  Results  Component Value Date   HGBA1C 7.0 (A) 05/24/2021      Assessment & Plan:   Problem List Items Addressed This Visit       Cardiovascular and Mediastinum   Essential hypertension, benign    Well controlled. Continue current regimen. Follow up in  86mo          Endocrine   Type 2 diabetes mellitus with periodontal complication (HCC) - Primary    A1c went up to 7.0 from previous of 6.1.  It sounds like he has had some dietary changes which are likely contributing we discussed getting back on track with low-carb and low sugar diet and really looking at portion control.  Also reducing his alcohol intake would be helpful as well in the meantime working to increase his long-acting insulin to 20 units he is only been using 15.  Follow-up in 3 months.       Relevant Medications   insulin glargine, 1 Unit Dial, (TOUJEO SOLOSTAR) 300 UNIT/ML Solostar Pen   Other Relevant Orders   POCT glycosylated hemoglobin (Hb A1C) (Completed)     Other   Alcohol abuse    He feels buspar helps him not drink but also warned he can't drink with Buspar.  Rx sent to pharmacy.          Meds ordered this encounter  Medications   busPIRone (BUSPAR) 5 MG tablet    Sig: Take 1 tablet (5 mg total) by mouth 2 (two) times daily for 10 days, THEN 2 tablets (10 mg total) 2 (two) times daily for 20 days.    Dispense:  100 tablet    Refill:  0   insulin glargine, 1 Unit Dial, (TOUJEO SOLOSTAR) 300 UNIT/ML Solostar Pen    Sig: Inject 20-35 Units into the skin at bedtime.    Dispense:  9 mL    Refill:  1    Follow-up: Return in about 3 months (around 08/24/2021) for Diabetes follow-up.    Nani Gasser, MD

## 2021-05-24 NOTE — Assessment & Plan Note (Signed)
Well controlled. Continue current regimen. Follow up in  6 mo  

## 2021-05-24 NOTE — Assessment & Plan Note (Signed)
He feels buspar helps him not drink but also warned he can't drink with Buspar.  Rx sent to pharmacy.

## 2021-05-25 DIAGNOSIS — F332 Major depressive disorder, recurrent severe without psychotic features: Secondary | ICD-10-CM | POA: Diagnosis not present

## 2021-05-26 DIAGNOSIS — F332 Major depressive disorder, recurrent severe without psychotic features: Secondary | ICD-10-CM | POA: Diagnosis not present

## 2021-05-29 DIAGNOSIS — F332 Major depressive disorder, recurrent severe without psychotic features: Secondary | ICD-10-CM | POA: Diagnosis not present

## 2021-05-31 DIAGNOSIS — F332 Major depressive disorder, recurrent severe without psychotic features: Secondary | ICD-10-CM | POA: Diagnosis not present

## 2021-06-02 DIAGNOSIS — F332 Major depressive disorder, recurrent severe without psychotic features: Secondary | ICD-10-CM | POA: Diagnosis not present

## 2021-06-05 ENCOUNTER — Telehealth: Payer: Self-pay | Admitting: Family Medicine

## 2021-06-05 NOTE — Progress Notes (Signed)
  Chronic Care Management   Note  06/05/2021 Name: [name] MRN: [id number] DOB: April 24, 1951  Gabriel Gilbert is a 70 y.o. year old male who is a primary care patient of [name], [name], MD. I reached out to Gabriel Gilbert by phone today in response to a referral sent by Gabriel Gilbert's PCP, [name], MD.   Gabriel Gilbert was given information about Chronic Care Management services today including:  CCM service includes personalized support from designated clinical staff supervised by his physician, including individualized plan of care and coordination with other care providers 24/7 contact phone numbers for assistance for urgent and routine care needs. Service will only be billed when office clinical staff spend 20 minutes or more in a month to coordinate care. Only one practitioner may furnish and bill the service in a calendar month. The patient may stop CCM services at any time (effective at the end of the month) by phone call to the office staff.   Patient did not agree to enrollment in care management services and does not wish to consider at this time.  Follow up plan:   Gabriel Gilbert Upstream Scheduler

## 2021-06-07 ENCOUNTER — Other Ambulatory Visit: Payer: Self-pay | Admitting: Psychiatry

## 2021-06-07 ENCOUNTER — Telehealth (HOSPITAL_COMMUNITY): Payer: Self-pay | Admitting: *Deleted

## 2021-06-07 MED ORDER — ALPRAZOLAM 0.5 MG PO TABS: ORAL_TABLET | ORAL | 0 refills | Status: DC

## 2021-06-07 NOTE — Telephone Encounter (Signed)
Ordered.   I have utilized the [place] Controlled Substances Reporting System (PMP AWARxE) to confirm adherence regarding the patient's medication. My review reveals appropriate prescription fills.

## 2021-06-07 NOTE — Telephone Encounter (Signed)
Patient called stating he would like refills for his Xanax sent to his pharmacy.

## 2021-06-16 ENCOUNTER — Telehealth: Payer: Medicare HMO

## 2021-07-05 ENCOUNTER — Telehealth: Payer: Self-pay | Admitting: Lab

## 2021-07-05 NOTE — Progress Notes (Signed)
error 

## 2021-07-05 NOTE — Progress Notes (Signed)
  Chronic Care Management   Outreach Note  07/05/2021 Name: Gabriel Gilbert MRN: [id number] DOB: 05-Mar-1951  Referred by: [name], MD Reason for referral : Medication Management   An unsuccessful telephone outreach was attempted today. The patient was referred to the pharmacist for assistance with care management and care coordination.   Follow Up Plan:   Carilyn Goodpasture  Upstream Scheduler

## 2021-07-06 ENCOUNTER — Other Ambulatory Visit: Payer: Self-pay | Admitting: Family Medicine

## 2021-07-07 ENCOUNTER — Telehealth (HOSPITAL_COMMUNITY): Payer: Self-pay

## 2021-07-07 ENCOUNTER — Other Ambulatory Visit: Payer: Self-pay | Admitting: Psychiatry

## 2021-07-07 MED ORDER — ALPRAZOLAM 0.5 MG PO TABS: ORAL_TABLET | ORAL | 0 refills | Status: DC

## 2021-07-07 NOTE — Telephone Encounter (Signed)
Patient needs a refill on Alprazolam sent to [facility] in [place]

## 2021-07-07 NOTE — Telephone Encounter (Signed)
Ordered for a month as a bridge.   I have utilized the [place] Controlled Substances Reporting System (PMP AWARxE) to confirm adherence regarding the patient's medication. My review reveals appropriate prescription fills.

## 2021-07-12 ENCOUNTER — Telehealth: Payer: Self-pay | Admitting: Lab

## 2021-07-12 ENCOUNTER — Encounter: Payer: Self-pay | Admitting: Family Medicine

## 2021-07-12 NOTE — Chronic Care Management (AMB) (Signed)
  Chronic Care Management   Note  07/12/2021 Name: Gabriel Gilbert MRN: [id number] DOB: 1951/01/06  Gabriel Gilbert is a 70 y.o. year old male who is a primary care patient of [name], [name], MD. I reached out to Gabriel Gilbert by phone today in response to a referral sent by Gabriel Gilbert's PCP, [name], MD.   Gabriel Gilbert was given information about Chronic Care Management services today including:  CCM service includes personalized support from designated clinical staff supervised by his physician, including individualized plan of care and coordination with other care providers 24/7 contact phone numbers for assistance for urgent and routine care needs. Service will only be billed when office clinical staff spend 20 minutes or more in a month to coordinate care. Only one practitioner may furnish and bill the service in a calendar month. The patient may stop CCM services at any time (effective at the end of the month) by phone call to the office staff.   Patient did not agree to enrollment in care management services and does not wish to consider at this time.  Follow up plan:   Carilyn Goodpasture  Upstream Scheduler

## 2021-07-24 ENCOUNTER — Ambulatory Visit (INDEPENDENT_AMBULATORY_CARE_PROVIDER_SITE_OTHER): Payer: Medicare HMO | Admitting: Family Medicine

## 2021-07-24 ENCOUNTER — Encounter: Payer: Self-pay | Admitting: Family Medicine

## 2021-07-24 ENCOUNTER — Other Ambulatory Visit: Payer: Self-pay

## 2021-07-24 VITALS — BP 128/58 | HR 88 | Ht 66.0 in | Wt 180.0 lb

## 2021-07-24 DIAGNOSIS — M25471 Effusion, right ankle: Secondary | ICD-10-CM | POA: Diagnosis not present

## 2021-07-24 DIAGNOSIS — Z23 Encounter for immunization: Secondary | ICD-10-CM | POA: Diagnosis not present

## 2021-07-24 DIAGNOSIS — R2241 Localized swelling, mass and lump, right lower limb: Secondary | ICD-10-CM | POA: Diagnosis not present

## 2021-07-24 DIAGNOSIS — D751 Secondary polycythemia: Secondary | ICD-10-CM

## 2021-07-24 NOTE — Progress Notes (Signed)
Acute Office Visit  Subjective:    Patient ID: Gabriel Gilbert, male    DOB: 05/31/1951, 70 y.o.   MRN: [id number]  Chief Complaint  Patient presents with   Diabetes   Hypertension    HPI Patient is in today for right foot swelling for a couple of weeks.  He says it was mostly on the top of his foot he does not remember any specific injury or trauma he says it really was not painful it was mostly just swollen he says it stayed pretty swollen for about a week and has been slowly getting better.  No prior history of inflammatory arthritis or gout.  He denies any calf or leg pain.  No chest pain or shortness of breath.  Past Medical History:  Diagnosis Date   Bell's palsy 12-10   Depression    Diabetes mellitus    type 2   Hyperlipidemia    Hypertension     Past Surgical History:  Procedure Laterality Date   CATARACT EXTRACTION Left    kidney stone removed     pfts     normal    Family History  Problem Relation Age of Onset   COPD Mother    Hypertension Mother    Alzheimer's disease Mother        ?   Diabetes Father    COPD Father    Drug abuse Son    COPD Sister    Cancer Sister        lung    Social History   Socioeconomic History   Marital status: Single    Spouse name: Not on file   Number of children: Not on file   Years of education: Not on file   Highest education level: Not on file  Occupational History   Not on file  Tobacco Use   Smoking status: Every Day    Packs/day: 1.00    Years: 25.00    Pack years: 25.00    Types: Cigarettes    Last attempt to quit: 02/10/2013    Years since quitting: 8.4   Smokeless tobacco: Never   Tobacco comments:    using electronic cigarettes  Vaping Use   Vaping Use: Every day  Substance and Sexual Activity   Alcohol use: No    Alcohol/week: 0.0 standard drinks   Drug use: No   Sexual activity: Never    Partners: Female  Other Topics Concern   Not on file  Social History Narrative   Not on file    Social Determinants of Health   Financial Resource Strain: Not on file  Food Insecurity: Not on file  Transportation Needs: Not on file  Physical Activity: Not on file  Stress: Not on file  Social Connections: Not on file  Intimate Partner Violence: Not on file    Outpatient Medications Prior to Visit  Medication Sig Dispense Refill   ALPRAZolam (XANAX) 0.5 MG tablet TAKE ONE TABLET BY MOUTH DAILY AS NEEDED 30 tablet 0   aspirin 81 MG tablet Take 81 mg by mouth daily.     B-D UF III MINI PEN NEEDLES 31G X 5 MM MISC USE FOR INJECTION ONCE DAILY 100 each 9   empagliflozin (JARDIANCE) 10 MG TABS tablet Take 1 tablet (10 mg total) by mouth daily. 90 tablet 1   FLUoxetine (PROZAC) 10 MG capsule Take 1 capsule (10 mg total) by mouth daily. 30 capsule 2   glucose blood (ONE TOUCH TEST STRIPS) test strip  Pt testing one time a day. 100 each 3   insulin glargine, 1 Unit Dial, (TOUJEO SOLOSTAR) 300 UNIT/ML Solostar Pen Inject 20-35 Units into the skin at bedtime. 9 mL 1   lisinopril (ZESTRIL) 20 MG tablet Take 1 tablet (20 mg total) by mouth daily. 90 tablet 1   metFORMIN (GLUCOPHAGE) 1000 MG tablet Take 1 tablet (1,000 mg total) by mouth 2 (two) times daily with a meal. 180 tablet 1   Misc. Devices MISC New CPAP machine with mask and supplies through [facility].  CPAP therapy at 14 cm. water pressure.     ONE TOUCH ULTRA TEST test strip TEST as directed 100 each 3   No facility-administered medications prior to visit.    No Known Allergies  Review of Systems     Objective:    Physical Exam Constitutional:      Appearance: He is well-developed.  HENT:     Head: Normocephalic and atraumatic.  Cardiovascular:     Rate and Rhythm: Normal rate and regular rhythm.     Heart sounds: Normal heart sounds.  Pulmonary:     Effort: Pulmonary effort is normal.     Breath sounds: Normal breath sounds.  Musculoskeletal:     Comments: Swelling around the right medial ankle.  No  significant swelling on the top of the foot.  Nontender metatarsals nontender around the ankle itself.  Normal range of motion of the ankles and toes.  Strength is 5 out of 5 in all directions.  No rash or erythema.  Skin:    General: Skin is warm and dry.  Neurological:     Mental Status: He is alert and oriented to person, place, and time.  Psychiatric:        Behavior: Behavior normal.    BP (!) 128/58   Pulse 88   Ht 5\' 6"  (1.676 m)   Wt 180 lb (81.6 kg)   SpO2 97%   BMI 29.05 kg/m  Wt Readings from Last 3 Encounters:  07/24/21 180 lb (81.6 kg)  05/24/21 180 lb (81.6 kg)  02/14/21 180 lb (81.6 kg)    There are no preventive care reminders to display for this patient.  There are no preventive care reminders to display for this patient.   Lab Results  Component Value Date   TSH 1.580 01/25/2015   Lab Results  Component Value Date   WBC 7.2 02/14/2021   HGB 19.1 (H) 02/14/2021   HCT 54.4 (H) 02/14/2021   MCV 98.9 02/14/2021   PLT 218 02/14/2021   Lab Results  Component Value Date   NA 138 02/14/2021   K 4.9 02/14/2021   CO2 21 02/14/2021   GLUCOSE 91 02/14/2021   BUN 14 02/14/2021   CREATININE 0.76 02/14/2021   BILITOT 0.4 02/14/2021   ALKPHOS 61 06/06/2017   AST 18 02/14/2021   ALT 15 02/14/2021   PROT 6.8 02/14/2021   ALBUMIN 4.1 06/06/2017   CALCIUM 10.1 02/14/2021   Lab Results  Component Value Date   CHOL 160 10/17/2020   Lab Results  Component Value Date   HDL 48 10/17/2020   Lab Results  Component Value Date   LDLCALC 84 10/17/2020   Lab Results  Component Value Date   TRIG 190 (H) 10/17/2020   Lab Results  Component Value Date   CHOLHDL 3.3 10/17/2020   Lab Results  Component Value Date   HGBA1C 7.0 (A) 05/24/2021       Assessment & Plan:  Problem List Items Addressed This Visit   None Visit Diagnoses     Need for immunization against influenza    -  Primary   Relevant Orders   Flu Vaccine QUAD High Dose(Fluad)  (Completed)   Right ankle swelling       Relevant Orders   COMPLETE METABOLIC PANEL WITH GFR   Sedimentation rate   C-reactive protein   Uric acid   CBC   Localized swelling of right foot       Relevant Orders   COMPLETE METABOLIC PANEL WITH GFR   Sedimentation rate   C-reactive protein   Uric acid   CBC      He does still have swelling present over the right medial ankle.  Unclear etiology without significant pain or discomfort unless suspicious of an injury or trauma or really even gout.  We will start by checking for inflammatory markers and elevated uric acid we will also check a CBC though I do not see any significant erythema on exam and he has normal range of motion and strength.  We will hold off on x-ray since it does seem to be resolving on its own.  He denies any calf pain to suspect DVT.  Heart is in normal sinus rhythm today.  No other recent changes to suggest change in renal or liver function which would be unlikely with asymmetric swelling.  No orders of the defined types were placed in this encounter.    Nani Gasser, MD

## 2021-07-25 LAB — COMPLETE METABOLIC PANEL WITH GFR
AG Ratio: 2 (calc) (ref 1.0–2.5)
ALT: 13 U/L (ref 9–46)
AST: 21 U/L (ref 10–35)
Albumin: 4.8 g/dL (ref 3.6–5.1)
Alkaline phosphatase (APISO): 62 U/L (ref 35–144)
BUN: 13 mg/dL (ref 7–25)
CO2: 26 mmol/L (ref 20–32)
Calcium: 10.6 mg/dL — ABNORMAL HIGH (ref 8.6–10.3)
Chloride: 104 mmol/L (ref 98–110)
Creat: 0.86 mg/dL (ref 0.70–1.35)
Globulin: 2.4 g/dL (calc) (ref 1.9–3.7)
Glucose, Bld: 95 mg/dL (ref 65–99)
Potassium: 4.4 mmol/L (ref 3.5–5.3)
Sodium: 140 mmol/L (ref 135–146)
Total Bilirubin: 0.4 mg/dL (ref 0.2–1.2)
Total Protein: 7.2 g/dL (ref 6.1–8.1)
eGFR: 94 mL/min/{1.73_m2} (ref >=60)

## 2021-07-25 LAB — CBC
HCT: 54.6 % — ABNORMAL HIGH (ref 38.5–50.0)
Hemoglobin: 18.7 g/dL — ABNORMAL HIGH (ref 13.2–17.1)
MCH: 32.6 pg (ref 27.0–33.0)
MCHC: 34.2 g/dL (ref 32.0–36.0)
MCV: 95.3 fL (ref 80.0–100.0)
MPV: 10.7 fL (ref 7.5–12.5)
Platelets: 278 10*3/uL (ref 140–400)
RBC: 5.73 10*6/uL (ref 4.20–5.80)
RDW: 13.2 % (ref 11.0–15.0)
WBC: 7.9 10*3/uL (ref 3.8–10.8)

## 2021-07-25 LAB — URIC ACID: Uric Acid, Serum: 6.4 mg/dL (ref 4.0–8.0)

## 2021-07-25 LAB — SEDIMENTATION RATE: Sed Rate: 2 mm/h (ref 0–20)

## 2021-07-25 LAB — C-REACTIVE PROTEIN: CRP: 0.8 mg/L (ref <8.0)

## 2021-07-25 NOTE — Progress Notes (Signed)
Hi Gabriel Gilbert, your calcium levels just slightly elevated.  We will keep an eye on this.  Your inflammatory markers are negative and your uric acid looks good so that is helpful in ruling out other types of arthritis and autoimmune disease that could cause her swelling.  Your hemoglobin is quite elevated at 18.  It is a little better than it was 5 months ago but it still high.  I do think we should work this up a little bit more.  Would you be willing to get some additional blood work may be next week?

## 2021-07-31 ENCOUNTER — Telehealth (HOSPITAL_COMMUNITY): Payer: Self-pay

## 2021-07-31 MED ORDER — BUSPIRONE HCL 10 MG PO TABS: 10.0000 mg | ORAL_TABLET | Freq: Two times a day (BID) | ORAL | 0 refills | Status: DC

## 2021-07-31 NOTE — Telephone Encounter (Signed)
Patient would like to get back on Buspar.  [facility] [place] Please advise

## 2021-07-31 NOTE — Telephone Encounter (Signed)
Sent a one month supply of Buspar per Dr. Gilmore Laroche

## 2021-08-01 ENCOUNTER — Telehealth: Payer: Self-pay | Admitting: Family Medicine

## 2021-08-01 NOTE — Telephone Encounter (Signed)
Patient came in to have additional lab work done (that PCP said for him to come in and do) but no orders were placed for additional labs. Please Advise. AM Patient wants to be called when it is okay to come back and have the additional labs done.

## 2021-08-02 NOTE — Progress Notes (Signed)
Labs ordered.

## 2021-08-02 NOTE — Addendum Note (Signed)
Addended by: Nani Gasser D on: 08/02/2021 04:42 PM   Modules accepted: Orders

## 2021-08-03 NOTE — Telephone Encounter (Signed)
Pls inform him I would outo of town for th elast 2.5 days.  Orders have been placed.

## 2021-08-04 DIAGNOSIS — D751 Secondary polycythemia: Secondary | ICD-10-CM | POA: Diagnosis not present

## 2021-08-04 NOTE — Telephone Encounter (Signed)
Patient was aware PCP was out of office the day he came in but patient has been notified that lab orders have been placed and that PCP has been out of town the last couple of days. AM 

## 2021-08-04 NOTE — Telephone Encounter (Signed)
Patient was aware PCP was out of office the day he came in but patient has been notified that lab orders have been placed and that PCP has been out of town the last couple of days. AM

## 2021-08-07 ENCOUNTER — Other Ambulatory Visit: Payer: Self-pay | Admitting: *Deleted

## 2021-08-07 DIAGNOSIS — F1010 Alcohol abuse, uncomplicated: Secondary | ICD-10-CM

## 2021-08-07 DIAGNOSIS — R718 Other abnormality of red blood cells: Secondary | ICD-10-CM

## 2021-08-07 LAB — CBC WITH DIFFERENTIAL/PLATELET
Absolute Monocytes: 421 cells/uL (ref 200–950)
Basophils Absolute: 41 cells/uL (ref 0–200)
Basophils Relative: 0.5 %
Eosinophils Absolute: 73 cells/uL (ref 15–500)
Eosinophils Relative: 0.9 %
HCT: 52 % — ABNORMAL HIGH (ref 38.5–50.0)
Hemoglobin: 17.5 g/dL — ABNORMAL HIGH (ref 13.2–17.1)
Lymphs Abs: 2090 cells/uL (ref 850–3900)
MCH: 32.5 pg (ref 27.0–33.0)
MCHC: 33.7 g/dL (ref 32.0–36.0)
MCV: 96.5 fL (ref 80.0–100.0)
MPV: 11.2 fL (ref 7.5–12.5)
Monocytes Relative: 5.2 %
Neutro Abs: 5476 cells/uL (ref 1500–7800)
Neutrophils Relative %: 67.6 %
Platelets: 263 10*3/uL (ref 140–400)
RBC: 5.39 10*6/uL (ref 4.20–5.80)
RDW: 13 % (ref 11.0–15.0)
Total Lymphocyte: 25.8 %
WBC: 8.1 10*3/uL (ref 3.8–10.8)

## 2021-08-07 LAB — ERYTHROPOIETIN: Erythropoietin: 14.9 m[IU]/mL (ref 2.6–18.5)

## 2021-08-07 LAB — PATHOLOGIST SMEAR REVIEW

## 2021-08-07 NOTE — Progress Notes (Signed)
Hemoglobin still elevated but looks better this time.

## 2021-08-07 NOTE — Progress Notes (Signed)
  Orders Placed This Encounter  Procedures   [place] ELASTOGRAPHY LIVER    Standing Status:   Future    Standing Expiration Date:   08/07/2022    Order Specific Question:   Reason for Exam (SYMPTOM  OR DIAGNOSIS REQUIRED)    Answer:   target cells and heavy alcohol use    Order Specific Question:   Preferred imaging location?    Answer:   [facility]

## 2021-08-07 NOTE — Progress Notes (Signed)
Hi Gabriel Gilbert, the extra Smear test that we did looks okay in regards to your platelets and hemoglobin.  They did note a couple of rare target cells this is can come from liver damage or hemoglobinopathies.  I like to consider an ultrasound of your liver if you are okay with that then please let me know.

## 2021-08-09 ENCOUNTER — Telehealth (HOSPITAL_COMMUNITY): Payer: Self-pay

## 2021-08-09 MED ORDER — ALPRAZOLAM 0.5 MG PO TABS: ORAL_TABLET | ORAL | 0 refills | Status: DC

## 2021-08-09 NOTE — Telephone Encounter (Signed)
Patient needs a refill on Alprazolam sent to [facility] in [place] 

## 2021-08-14 ENCOUNTER — Ambulatory Visit (HOSPITAL_BASED_OUTPATIENT_CLINIC_OR_DEPARTMENT_OTHER): Payer: Medicare HMO

## 2021-08-15 ENCOUNTER — Ambulatory Visit (HOSPITAL_BASED_OUTPATIENT_CLINIC_OR_DEPARTMENT_OTHER)
Admission: RE | Admit: 2021-08-15 | Discharge: 2021-08-15 | Disposition: A | Discharge status: Home / Self Care | Payer: Medicare HMO | Source: Ambulatory Visit | Attending: Family Medicine | Admitting: Family Medicine

## 2021-08-15 ENCOUNTER — Other Ambulatory Visit: Payer: Self-pay

## 2021-08-15 ENCOUNTER — Encounter (HOSPITAL_BASED_OUTPATIENT_CLINIC_OR_DEPARTMENT_OTHER): Payer: Self-pay

## 2021-08-15 DIAGNOSIS — R718 Other abnormality of red blood cells: Secondary | ICD-10-CM

## 2021-08-15 DIAGNOSIS — F1010 Alcohol abuse, uncomplicated: Secondary | ICD-10-CM

## 2021-08-21 ENCOUNTER — Ambulatory Visit (HOSPITAL_BASED_OUTPATIENT_CLINIC_OR_DEPARTMENT_OTHER)
Admission: RE | Admit: 2021-08-21 | Discharge: 2021-08-21 | Disposition: A | Discharge status: Home / Self Care | Payer: Medicare HMO | Source: Ambulatory Visit | Attending: Family Medicine | Admitting: Family Medicine

## 2021-08-21 ENCOUNTER — Other Ambulatory Visit: Payer: Self-pay

## 2021-08-21 DIAGNOSIS — K760 Fatty (change of) liver, not elsewhere classified: Secondary | ICD-10-CM | POA: Diagnosis not present

## 2021-08-21 DIAGNOSIS — R718 Other abnormality of red blood cells: Secondary | ICD-10-CM | POA: Diagnosis not present

## 2021-08-21 DIAGNOSIS — F1010 Alcohol abuse, uncomplicated: Secondary | ICD-10-CM | POA: Insufficient documentation

## 2021-08-22 ENCOUNTER — Other Ambulatory Visit: Payer: Self-pay | Admitting: Family Medicine

## 2021-08-22 ENCOUNTER — Encounter: Payer: Self-pay | Admitting: Family Medicine

## 2021-08-22 DIAGNOSIS — E11630 Type 2 diabetes mellitus with periodontal disease: Secondary | ICD-10-CM

## 2021-08-22 DIAGNOSIS — K700 Alcoholic fatty liver: Secondary | ICD-10-CM | POA: Insufficient documentation

## 2021-08-22 DIAGNOSIS — Z794 Long term (current) use of insulin: Secondary | ICD-10-CM

## 2021-08-22 NOTE — Progress Notes (Signed)
Hi Teddy, the ultrasound of the liver is most consistent with what is called "fatty" liver.  This is where there is extra fat in the liver.  This causes inflammation which oftentimes causes the liver enzymes to elevate.  To treat fatty liver the recommendation is to avoid sugary foods and those with a lot of carbohydrates.  Showed me eating more vegetables and lean proteins and really cutting back on the alcohol which can cause further damage especially if you already have for it fatty liver.

## 2021-08-24 ENCOUNTER — Encounter: Payer: Self-pay | Admitting: Family Medicine

## 2021-08-24 ENCOUNTER — Telehealth: Payer: Self-pay | Admitting: Family Medicine

## 2021-08-24 ENCOUNTER — Ambulatory Visit (INDEPENDENT_AMBULATORY_CARE_PROVIDER_SITE_OTHER): Payer: Medicare HMO | Admitting: Family Medicine

## 2021-08-24 ENCOUNTER — Other Ambulatory Visit: Payer: Self-pay

## 2021-08-24 VITALS — BP 103/53 | HR 66 | Ht 66.0 in | Wt 178.0 lb

## 2021-08-24 DIAGNOSIS — Z794 Long term (current) use of insulin: Secondary | ICD-10-CM | POA: Diagnosis not present

## 2021-08-24 DIAGNOSIS — E11630 Type 2 diabetes mellitus with periodontal disease: Secondary | ICD-10-CM | POA: Diagnosis not present

## 2021-08-24 DIAGNOSIS — M79605 Pain in left leg: Secondary | ICD-10-CM | POA: Diagnosis not present

## 2021-08-24 DIAGNOSIS — F411 Generalized anxiety disorder: Secondary | ICD-10-CM

## 2021-08-24 DIAGNOSIS — I10 Essential (primary) hypertension: Secondary | ICD-10-CM | POA: Diagnosis not present

## 2021-08-24 DIAGNOSIS — R251 Tremor, unspecified: Secondary | ICD-10-CM

## 2021-08-24 LAB — POCT GLYCOSYLATED HEMOGLOBIN (HGB A1C): Hemoglobin A1C: 6.2 % — AB (ref 4.0–5.6)

## 2021-08-24 NOTE — Assessment & Plan Note (Signed)
Gust the tremor can come from many things including anxiety, medication side effect, low blood sugar as well as other disorders such as essential tremor and Parkinson's disease.  I was unable to witness the tremor on exam today.  But based on his description I suspect that it may be more medication or anxiety related.  No family history work that is worrisome.  For now we will keep an eye on it but consider need for further evaluation if needed

## 2021-08-24 NOTE — Assessment & Plan Note (Signed)
Blood Pressure is a little on the low end today so may have him actually cut his lisinopril in half and just take a half a tab daily.  If blood pressure goes back up then okay to go back to a whole tab.

## 2021-08-24 NOTE — Assessment & Plan Note (Signed)
Well controlled. Continue current regimen. Follow up in  4 mo. he really has done a great job in getting his A1c back down encouraged him just to continue to work on avoiding sweets carbs and alcohol

## 2021-08-24 NOTE — Telephone Encounter (Signed)
Call pt: Blood Pressure is a little on the low end today so may have him actually cut his lisinopril in half and just take a half a tab daily.  If blood pressure goes back up then okay to go back to a whole tab.

## 2021-08-24 NOTE — Telephone Encounter (Signed)
Patient advised of recommendations.  

## 2021-08-24 NOTE — Progress Notes (Signed)
Established Patient Office Visit  Subjective:  Patient ID: Gabriel Gilbert, male    DOB: [birth year]  Age: 70 y.o. MRN: [id number]  CC:  Chief Complaint  Patient presents with   Diabetes    HPI HOA DERISO presents for   Hypertension- Pt denies chest pain, SOB, dizziness, or heart palpitations.  Taking meds as directed w/o problems.  Denies medication side effects.    Diabetes - no hypoglycemic events. No wounds or sores that are not healing well. No increased thirst or urination. Checking glucose at home. Taking medications as prescribed without any side effects.  Also reports that he gets pain in his left lateral lower leg and most of the lateral side of his left calf with walking and ambulation says it becomes painful enough that he has to stop and sit and rest and if he rests for a little bit he is able to get up and continue to walk.  He does not feel like he has any pain in that knee or ankle that is radiating into that area.  Friend who is with him today just notices that he will start to MO stacker with that leg because it starts to hurt.  He also reports that he has an occasional tremor its not always there but it comes and goes sometimes more so in the middle of the afternoon.  No family history of tremor.  He does notice it more when he feels anxious or stressed.  Past Medical History:  Diagnosis Date   Bell's palsy 12-10   Depression    Diabetes mellitus    type 2   Hyperlipidemia    Hypertension     Past Surgical History:  Procedure Laterality Date   CATARACT EXTRACTION Left    kidney stone removed     pfts     normal    Family History  Problem Relation Age of Onset   COPD Mother    Hypertension Mother    Alzheimer's disease Mother        ?   Diabetes Father    COPD Father    Drug abuse Son    COPD Sister    Cancer Sister        lung    Social History   Socioeconomic History   Marital status: Single    Spouse name: Not on file   Number of  children: Not on file   Years of education: Not on file   Highest education level: Not on file  Occupational History   Not on file  Tobacco Use   Smoking status: Every Day    Packs/day: 1.00    Years: 25.00    Pack years: 25.00    Types: Cigarettes    Last attempt to quit: 02/10/2013    Years since quitting: 8.5   Smokeless tobacco: Never   Tobacco comments:    using electronic cigarettes  Vaping Use   Vaping Use: Every day  Substance and Sexual Activity   Alcohol use: No    Alcohol/week: 0.0 standard drinks   Drug use: No   Sexual activity: Never    Partners: Female  Other Topics Concern   Not on file  Social History Narrative   Not on file   Social Determinants of Health   Financial Resource Strain: Not on file  Food Insecurity: Not on file  Transportation Needs: Not on file  Physical Activity: Not on file  Stress: Not on file  Social Connections: Not  on file  Intimate Partner Violence: Not on file    Outpatient Medications Prior to Visit  Medication Sig Dispense Refill   ALPRAZolam (XANAX) 0.5 MG tablet TAKE ONE TABLET BY MOUTH DAILY AS NEEDED 30 tablet 0   aspirin 81 MG tablet Take 81 mg by mouth daily.     B-D UF III MINI PEN NEEDLES 31G X 5 MM MISC USE FOR INJECTION ONCE DAILY 100 each 9   busPIRone (BUSPAR) 10 MG tablet Take 1 tablet (10 mg total) by mouth 2 (two) times daily. 60 tablet 0   FLUoxetine (PROZAC) 10 MG capsule Take 1 capsule (10 mg total) by mouth daily. 30 capsule 2   glucose blood (ONE TOUCH TEST STRIPS) test strip Pt testing one time a day. 100 each 3   insulin glargine, 1 Unit Dial, (TOUJEO SOLOSTAR) 300 UNIT/ML Solostar Pen Inject 20-35 Units into the skin at bedtime. 9 mL 1   JARDIANCE 10 MG TABS tablet TAKE ONE TABLET BY MOUTH DAILY 90 tablet 1   lisinopril (ZESTRIL) 20 MG tablet TAKE ONE TABLET BY MOUTH DAILY 90 tablet 1   metFORMIN (GLUCOPHAGE) 1000 MG tablet Take 1 tablet (1,000 mg total) by mouth 2 (two) times daily with a meal. 180  tablet 1   Misc. Devices MISC New CPAP machine with mask and supplies through [place].  CPAP therapy at 14 cm. water pressure.     ONE TOUCH ULTRA TEST test strip TEST as directed 100 each 3   No facility-administered medications prior to visit.    No Known Allergies  ROS Review of Systems    Objective:    Physical Exam  BP (!) 103/53   Pulse 66   Ht 5' 6"  (1.676 m)   Wt 178 lb (80.7 kg)   SpO2 98%   BMI 28.73 kg/m  Wt Readings from Last 3 Encounters:  08/24/21 178 lb (80.7 kg)  07/24/21 180 lb (81.6 kg)  05/24/21 180 lb (81.6 kg)     There are no preventive care reminders to display for this patient.  There are no preventive care reminders to display for this patient.  Lab Results  Component Value Date   TSH 1.580 01/25/2015   Lab Results  Component Value Date   WBC 8.1 08/04/2021   HGB 17.5 (H) 08/04/2021   HCT 52.0 (H) 08/04/2021   MCV 96.5 08/04/2021   PLT 263 08/04/2021   Lab Results  Component Value Date   NA 140 07/24/2021   K 4.4 07/24/2021   CO2 26 07/24/2021   GLUCOSE 95 07/24/2021   BUN 13 07/24/2021   CREATININE 0.86 07/24/2021   BILITOT 0.4 07/24/2021   ALKPHOS 61 06/06/2017   AST 21 07/24/2021   ALT 13 07/24/2021   PROT 7.2 07/24/2021   ALBUMIN 4.1 06/06/2017   CALCIUM 10.6 (H) 07/24/2021   EGFR 94 07/24/2021   Lab Results  Component Value Date   CHOL 160 10/17/2020   Lab Results  Component Value Date   HDL 48 10/17/2020   Lab Results  Component Value Date   LDLCALC 84 10/17/2020   Lab Results  Component Value Date   TRIG 190 (H) 10/17/2020   Lab Results  Component Value Date   CHOLHDL 3.3 10/17/2020   Lab Results  Component Value Date   HGBA1C 6.2 (A) 08/24/2021      Assessment & Plan:   Problem List Items Addressed This Visit       Cardiovascular and Mediastinum  Essential hypertension, benign    Blood Pressure is a little on the low end today so may have him actually cut his lisinopril in  half and just take a half a tab daily.  If blood pressure goes back up then okay to go back to a whole tab.      Relevant Orders   VAS [place] ABI WITH/WO TBI     Endocrine   Type 2 diabetes mellitus with periodontal complication ([place]) - Primary    Well controlled. Continue current regimen. Follow up in  4 mo. he really has done a great job in getting his A1c back down encouraged him just to continue to work on avoiding sweets carbs and alcohol      Relevant Orders   POCT glycosylated hemoglobin (Hb A1C) (Completed)   VAS [place] ABI WITH/WO TBI     Other   Tremor    Gust the tremor can come from many things including anxiety, medication side effect, low blood sugar as well as other disorders such as essential tremor and Parkinson's disease.  I was unable to witness the tremor on exam today.  But based on his description I suspect that it may be more medication or anxiety related.  No family history work that is worrisome.  For now we will keep an eye on it but consider need for further evaluation if needed      Generalized anxiety disorder    Ports that sometimes he runs out of his Xanax in 2 weeks instead of being able to make it last a whole month.  Just stressed using techniques to try to reduce anxiety before using the Xanax which really is a last resort and rescue medication and making sure that he is doing those things before taking the medication      Other Visit Diagnoses     Left leg pain       Relevant Orders   VAS [place] ABI WITH/WO TBI      Left lower lateral leg pain-sounds like it could be from peripheral vascular disease.  Like to order ABIs to work this up further.  No orders of the defined types were placed in this encounter.   Follow-up: Return in about 4 months (around 12/25/2021) for Diabetes follow-up, Hypertension.    Beatrice Lecher, MD

## 2021-08-24 NOTE — Assessment & Plan Note (Signed)
Ports that sometimes he runs out of his Xanax in 2 weeks instead of being able to make it last a whole month.  Just stressed using techniques to try to reduce anxiety before using the Xanax which really is a last resort and rescue medication and making sure that he is doing those things before taking the medication

## 2021-08-28 ENCOUNTER — Encounter (HOSPITAL_COMMUNITY): Payer: Self-pay | Admitting: Psychiatry

## 2021-08-28 ENCOUNTER — Telehealth (INDEPENDENT_AMBULATORY_CARE_PROVIDER_SITE_OTHER): Payer: Medicare HMO | Admitting: Psychiatry

## 2021-08-28 DIAGNOSIS — F411 Generalized anxiety disorder: Secondary | ICD-10-CM

## 2021-08-28 DIAGNOSIS — Z789 Other specified health status: Secondary | ICD-10-CM | POA: Diagnosis not present

## 2021-08-28 DIAGNOSIS — F331 Major depressive disorder, recurrent, moderate: Secondary | ICD-10-CM

## 2021-08-28 MED ORDER — FLUOXETINE HCL 10 MG PO CAPS: 10.0000 mg | ORAL_CAPSULE | Freq: Every day | ORAL | 2 refills | Status: DC

## 2021-08-28 NOTE — Progress Notes (Signed)
[facility] Follow up visit  Telepsych Patient Identification: [name] MRN:  [id number] Date of Evaluation:  08/28/2021 Referral Source: primary care.  Chief Complaint:    anxiety Visit Diagnosis:    ICD-10-CM   1. Major depressive disorder, recurrent episode, moderate (HCC)  F33.1     2. GAD (generalized anxiety disorder)  F41.1     3. Alcohol use  Z78.9        Virtual Visit via Telephone Note  I connected with Gabriel Gilbert on 08/28/21 at 11:00 AM EDT by telephone and verified that I am speaking with the correct person using two identifiers.  Location: Patient: home Provider: home office   I discussed the limitations, risks, security and privacy concerns of performing an evaluation and management service by telephone and the availability of in person appointments. I also discussed with the patient that there may be a patient responsible charge related to this service. The patient expressed understanding and agreed to proceed.     I discussed the assessment and treatment plan with the patient. The patient was provided an opportunity to ask questions and all were answered. The patient agreed with the plan and demonstrated an understanding of the instructions.   The patient was advised to call back or seek an in-person evaluation if the symptoms worsen or if the condition fails to improve as anticipated.  I provided 15 minutes of non-face-to-face time during this encounter.   Thresa Ross, MD   HPI:   Patient mostly stays at home watches television.  Resident on Xanax with talked about keeping it as as needed and take BuSpar regularly Feels lonely and get depressed apparently has not been taking Prozac regularly he wants to restart  Follow the primary care physician says has stopped alcohol 2 weeks ago as he understands it is a risk to his liver  Motivation remains low seldom goes out or does grossly when he goes out    Aggravating factor:  Loneliness ,modifying factor: dog,  Some friends.  Timing in morning   Past Psychiatric History: depression ,anxiety  Previous Psychotropic Medications: Yes   Substance Abuse History in the last 12 months:  No.  Consequences of Substance Abuse: NA  Past Medical History:  Past Medical History:  Diagnosis Date   Bell's palsy 12-10   Depression    Diabetes mellitus    type 2   Hyperlipidemia    Hypertension     Past Surgical History:  Procedure Laterality Date   CATARACT EXTRACTION Left    kidney stone removed     pfts     normal    Family Psychiatric History: anxiety. Mom used valium  Family History:  Family History  Problem Relation Age of Onset   COPD Mother    Hypertension Mother    Alzheimer's disease Mother        ?   Diabetes Father    COPD Father    Drug abuse Son    COPD Sister    Cancer Sister        lung    Social History:   Social History   Socioeconomic History   Marital status: Single    Spouse name: Not on file   Number of children: Not on file   Years of education: Not on file   Highest education level: Not on file  Occupational History   Not on file  Tobacco Use   Smoking status: Every Day    Packs/day: 1.00  Years: 25.00    Pack years: 25.00    Types: Cigarettes    Last attempt to quit: 02/10/2013    Years since quitting: 8.5   Smokeless tobacco: Never   Tobacco comments:    using electronic cigarettes  Vaping Use   Vaping Use: Every day  Substance and Sexual Activity   Alcohol use: No    Alcohol/week: 0.0 standard drinks   Drug use: No   Sexual activity: Never    Partners: Female  Other Topics Concern   Not on file  Social History Narrative   Not on file   Social Determinants of Health   Financial Resource Strain: Not on file  Food Insecurity: Not on file  Transportation Needs: Not on file  Physical Activity: Not on file  Stress: Not on file  Social Connections: Not on file      Allergies:  No Known  Allergies  Metabolic Disorder Labs: Lab Results  Component Value Date   HGBA1C 6.2 (A) 08/24/2021   No results found for: PROLACTIN Lab Results  Component Value Date   CHOL 160 10/17/2020   TRIG 190 (H) 10/17/2020   HDL 48 10/17/2020   CHOLHDL 3.3 10/17/2020   VLDL 22 01/17/2016   LDLCALC 84 10/17/2020   LDLCALC 17 08/25/2019     Current Medications: Current Outpatient Medications  Medication Sig Dispense Refill   ALPRAZolam (XANAX) 0.5 MG tablet TAKE ONE TABLET BY MOUTH DAILY AS NEEDED 30 tablet 0   aspirin 81 MG tablet Take 81 mg by mouth daily.     B-D UF III MINI PEN NEEDLES 31G X 5 MM MISC USE FOR INJECTION ONCE DAILY 100 each 9   busPIRone (BUSPAR) 10 MG tablet Take 1 tablet (10 mg total) by mouth 2 (two) times daily. 60 tablet 0   FLUoxetine (PROZAC) 10 MG capsule Take 1 capsule (10 mg total) by mouth daily. Delete prior refills 30 capsule 2   glucose blood (ONE TOUCH TEST STRIPS) test strip Pt testing one time a day. 100 each 3   insulin glargine, 1 Unit Dial, (TOUJEO SOLOSTAR) 300 UNIT/ML Solostar Pen Inject 20-35 Units into the skin at bedtime. 9 mL 1   JARDIANCE 10 MG TABS tablet TAKE ONE TABLET BY MOUTH DAILY 90 tablet 1   lisinopril (ZESTRIL) 20 MG tablet TAKE ONE TABLET BY MOUTH DAILY 90 tablet 1   metFORMIN (GLUCOPHAGE) 1000 MG tablet Take 1 tablet (1,000 mg total) by mouth 2 (two) times daily with a meal. 180 tablet 1   Misc. Devices MISC New CPAP machine with mask and supplies through [facility].  CPAP therapy at 14 cm. water pressure.     ONE TOUCH ULTRA TEST test strip TEST as directed 100 each 3   No current facility-administered medications for this visit.      Psychiatric Specialty Exam: Review of Systems  Cardiovascular:  Negative for chest pain.  Psychiatric/Behavioral:  Negative for suicidal ideas.    There were no vitals taken for this visit.There is no height or weight on file to calculate BMI.  General Appearance:   Eye Contact:     Speech:  Slow  Volume:  Decreased  Mood: Somewhat subdued  Affect:   Thought Process:  Goal Directed  Orientation:  Full (Time, Place, and Person)  Thought Content:  Rumination  Suicidal Thoughts:  No  Homicidal Thoughts:  No  Memory:  Immediate;   Fair Recent;   Fair  Judgement:  Poor  Insight:  Shallow  Psychomotor Activity:  Decreased  Concentration:  Concentration: Fair and Attention Span: Fair  Recall:  [employer] of Knowledge:Fair  Language: Fair  Akathisia:  Negative  Handed:  Right  AIMS (if indicated):  0  Assets:  Desire for Improvement  ADL's:  Intact  Cognition: WNL  Sleep:  fair    Treatment Plan Summary: Medication management and Plan as follows   Prior documentation reviewed 1. Major depression moderate recurrent: Somewhat subdued discussed medication compliance he he wants to restart Prozac we will start 10 mg he does understand it takes time to build up  2.GAD: Fluctuates Xanax helps for as needed discussed compliance with BuSpar so he does not have to take Xanax on a regular basis restart Prozac  Discussed alcohol abstinence he says he was not addicted he just stopped  Follow-up in 2 months he wants to come back in 4 months  Thresa Ross, MD 10/17/202211:12 AM

## 2021-09-11 ENCOUNTER — Telehealth (HOSPITAL_COMMUNITY): Payer: Self-pay | Admitting: Psychiatry

## 2021-09-11 MED ORDER — ALPRAZOLAM 0.5 MG PO TABS: ORAL_TABLET | ORAL | 0 refills | Status: DC

## 2021-09-11 NOTE — Telephone Encounter (Signed)
Refill: ALPRAZolam (XANAX) 0.5 MG tablet   Send to: [facility] [id number] - [place], [place] - [address]

## 2021-09-13 ENCOUNTER — Telehealth: Payer: Self-pay | Admitting: *Deleted

## 2021-09-13 DIAGNOSIS — N401 Enlarged prostate with lower urinary tract symptoms: Secondary | ICD-10-CM

## 2021-09-13 MED ORDER — SOLIFENACIN SUCCINATE 5 MG PO TABS: 5.0000 mg | ORAL_TABLET | Freq: Every day | ORAL | 0 refills | Status: DC

## 2021-09-13 MED ORDER — TAMSULOSIN HCL 0.4 MG PO CAPS: 0.4000 mg | ORAL_CAPSULE | Freq: Every day | ORAL | 5 refills | Status: DC

## 2021-09-13 NOTE — Telephone Encounter (Signed)
Pt called and asked about restarting medication for incontinence.   Will send over Rx for Tamsulosin.  No other bladder medications listed on medication list.   I will also fwd to pcp for any other advice on this.

## 2021-09-13 NOTE — Telephone Encounter (Signed)
Called pt and advised him of medication being sent to pharmacy.

## 2021-09-13 NOTE — Telephone Encounter (Signed)
Prescription sent for Vesicare.  We will start with 5 mg but can adjust dose upward if needed.  Also consider urology referral if needed.

## 2021-09-15 ENCOUNTER — Other Ambulatory Visit: Payer: Self-pay | Admitting: Family Medicine

## 2021-09-15 DIAGNOSIS — E11630 Type 2 diabetes mellitus with periodontal disease: Secondary | ICD-10-CM

## 2021-09-19 DIAGNOSIS — G4733 Obstructive sleep apnea (adult) (pediatric): Secondary | ICD-10-CM | POA: Diagnosis not present

## 2021-09-19 DIAGNOSIS — Z9989 Dependence on other enabling machines and devices: Secondary | ICD-10-CM | POA: Diagnosis not present

## 2021-09-19 DIAGNOSIS — F17218 Nicotine dependence, cigarettes, with other nicotine-induced disorders: Secondary | ICD-10-CM | POA: Diagnosis not present

## 2021-10-09 ENCOUNTER — Telehealth (HOSPITAL_COMMUNITY): Payer: Self-pay

## 2021-10-09 MED ORDER — ALPRAZOLAM 0.5 MG PO TABS: ORAL_TABLET | ORAL | 0 refills | Status: DC

## 2021-10-09 NOTE — Telephone Encounter (Signed)
Patient needs a refill on Xanax sent to [facility] in [place]

## 2021-10-12 DIAGNOSIS — R350 Frequency of micturition: Secondary | ICD-10-CM | POA: Diagnosis not present

## 2021-10-26 ENCOUNTER — Telehealth: Payer: Self-pay | Admitting: *Deleted

## 2021-10-26 NOTE — Telephone Encounter (Signed)
Pt called and asked that Dr. [name] call him something in for sleep. Pt was on Trazodone but stated the medication doesn't work. Prior to Trazodone he took Asked if he tried Melatonin he said that he hasn't.   Pt advised that although his has been given and tried sleep medication in the past that he will need to schedule an appt to discuss this with her. He asked if I would just ask to see if Dr. [name] would send something in for him   Message fwd to pcp for advice.

## 2021-10-27 MED ORDER — HYDROXYZINE PAMOATE 25 MG PO CAPS: 25.0000 mg | ORAL_CAPSULE | Freq: Every evening | ORAL | 0 refills | Status: DC | PRN

## 2021-10-27 NOTE — Telephone Encounter (Signed)
Prescription sent.  I would also like for him to talk with his psychiatrist about this as well.   Meds ordered this encounter  Medications   hydrOXYzine (VISTARIL) 25 MG capsule    Sig: Take 1-2 capsules (25-50 mg total) by mouth at bedtime as needed. For sleep    Dispense:  45 capsule    Refill:  0

## 2021-10-27 NOTE — Telephone Encounter (Signed)
Pt informed.  Pt expressed understanding however he stated that he is not interested in talking to his psychiatrist about his sleep.  He stated that all he wants from his psychiatrist is his Xanax prescriptions.  Tiajuana Amass, CMA

## 2021-11-08 ENCOUNTER — Telehealth (HOSPITAL_COMMUNITY): Payer: Self-pay

## 2021-11-08 MED ORDER — ALPRAZOLAM 0.5 MG PO TABS: ORAL_TABLET | ORAL | 0 refills | Status: DC

## 2021-11-08 NOTE — Telephone Encounter (Signed)
Patient is calling to get a refill on Alprazolam. [facility] in [place]

## 2021-11-09 ENCOUNTER — Other Ambulatory Visit: Payer: Self-pay | Admitting: Family Medicine

## 2021-11-30 DIAGNOSIS — F17218 Nicotine dependence, cigarettes, with other nicotine-induced disorders: Secondary | ICD-10-CM | POA: Diagnosis not present

## 2021-11-30 DIAGNOSIS — F17210 Nicotine dependence, cigarettes, uncomplicated: Secondary | ICD-10-CM | POA: Diagnosis not present

## 2021-12-07 ENCOUNTER — Ambulatory Visit: Payer: Medicare HMO | Admitting: Family Medicine

## 2021-12-11 ENCOUNTER — Telehealth (HOSPITAL_COMMUNITY): Payer: Self-pay

## 2021-12-11 MED ORDER — ALPRAZOLAM 0.5 MG PO TABS: ORAL_TABLET | ORAL | 0 refills | Status: DC

## 2021-12-11 NOTE — Telephone Encounter (Signed)
Patient needs a refill on Alprazolam sent to [facility] in [place] 

## 2021-12-12 ENCOUNTER — Ambulatory Visit (INDEPENDENT_AMBULATORY_CARE_PROVIDER_SITE_OTHER): Payer: Medicare HMO | Admitting: Family Medicine

## 2021-12-12 ENCOUNTER — Encounter: Payer: Self-pay | Admitting: Family Medicine

## 2021-12-12 ENCOUNTER — Telehealth: Payer: Self-pay | Admitting: Family Medicine

## 2021-12-12 ENCOUNTER — Other Ambulatory Visit: Payer: Self-pay

## 2021-12-12 VITALS — BP 127/56 | HR 79 | Temp 98.7°F | Ht 68.0 in | Wt 179.1 lb

## 2021-12-12 DIAGNOSIS — Z720 Tobacco use: Secondary | ICD-10-CM | POA: Diagnosis not present

## 2021-12-12 DIAGNOSIS — E11630 Type 2 diabetes mellitus with periodontal disease: Secondary | ICD-10-CM

## 2021-12-12 DIAGNOSIS — Z794 Long term (current) use of insulin: Secondary | ICD-10-CM | POA: Diagnosis not present

## 2021-12-12 DIAGNOSIS — F17210 Nicotine dependence, cigarettes, uncomplicated: Secondary | ICD-10-CM

## 2021-12-12 DIAGNOSIS — F17200 Nicotine dependence, unspecified, uncomplicated: Secondary | ICD-10-CM

## 2021-12-12 MED ORDER — NICOTINE POLACRILEX 4 MG MT LOZG: 4.0000 mg | LOZENGE | OROMUCOSAL | 1 refills | Status: DC | PRN

## 2021-12-12 MED ORDER — VARENICLINE TARTRATE 1 MG PO TABS: ORAL_TABLET | ORAL | 1 refills | Status: DC

## 2021-12-12 NOTE — Progress Notes (Signed)
Established Patient Office Visit  Subjective:  Patient ID: Gabriel Gilbert, male    DOB: Sep 20, 1951  Age: 71 y.o. MRN: [id number]  CC:  Chief Complaint  Patient presents with   [name] presents to discuss smoking cessation.  He is says he is interested in Chantix he says he used it years ago when it first came out and took it for couple of weeks but then stopped it he does remember getting a little bit of nausea on it but says he just stopped it because he kept smoking not because of the nausea.  He just feels like smoking helps him cope with his anxiety and that is really where he has difficulty quitting completely.  He says he has given up alcohol and is no longer drinking.  Diabetes-he says he is interested in a continuous glucometer.  Past Medical History:  Diagnosis Date   Bell's palsy 12-10   Depression    Diabetes mellitus    type 2   Hyperlipidemia    Hypertension     Past Surgical History:  Procedure Laterality Date   CATARACT EXTRACTION Left    kidney stone removed     pfts     normal    Family History  Problem Relation Age of Onset   COPD Mother    Hypertension Mother    Alzheimer's disease Mother        ?   Diabetes Father    COPD Father    Drug abuse Son    COPD Sister    Cancer Sister        lung    Social History   Socioeconomic History   Marital status: Single    Spouse name: Not on file   Number of children: Not on file   Years of education: Not on file   Highest education level: Not on file  Occupational History   Not on file  Tobacco Use   Smoking status: Every Day    Packs/day: 1.00    Years: 25.00    Pack years: 25.00    Types: Cigarettes    Last attempt to quit: 02/10/2013    Years since quitting: 8.8   Smokeless tobacco: Current    Types: Chew   Tobacco comments:    using electronic cigarettes  Vaping Use   Vaping Use: Every day  Substance and Sexual Activity   Alcohol use: No     Alcohol/week: 0.0 standard drinks   Drug use: No   Sexual activity: Not Currently    Partners: Female  Other Topics Concern   Not on file  Social History Narrative   Not on file   Social Determinants of Health   Financial Resource Strain: Not on file  Food Insecurity: Not on file  Transportation Needs: Not on file  Physical Activity: Not on file  Stress: Not on file  Social Connections: Not on file  Intimate Partner Violence: Not on file    Outpatient Medications Prior to Visit  Medication Sig Dispense Refill   ALPRAZolam (XANAX) 0.5 MG tablet TAKE ONE TABLET BY MOUTH DAILY AS NEEDED 30 tablet 0   aspirin 81 MG tablet Take 81 mg by mouth daily.     B-D UF III MINI PEN NEEDLES 31G X 5 MM MISC USE FOR INJECTION ONCE DAILY 100 each 9   busPIRone (BUSPAR) 10 MG tablet Take 1 tablet (10 mg total) by mouth 2 (two) times daily.  60 tablet 0   FLUoxetine (PROZAC) 10 MG capsule Take 1 capsule (10 mg total) by mouth daily. Delete prior refills 30 capsule 2   glucose blood (ONE TOUCH TEST STRIPS) test strip Pt testing one time a day. 100 each 3   hydrOXYzine (VISTARIL) 25 MG capsule Take 1-2 capsules (25-50 mg total) by mouth at bedtime as needed. For sleep 45 capsule 0   insulin glargine, 1 Unit Dial, (TOUJEO SOLOSTAR) 300 UNIT/ML Solostar Pen Inject 20-35 Units into the skin at bedtime. 9 mL 1   JARDIANCE 10 MG TABS tablet TAKE ONE TABLET BY MOUTH DAILY 90 tablet 1   lisinopril (ZESTRIL) 20 MG tablet TAKE ONE TABLET BY MOUTH DAILY 90 tablet 1   metFORMIN (GLUCOPHAGE) 1000 MG tablet TAKE ONE TABLET BY MOUTH TWICE A DAY WITH A MEAL 180 tablet 1   Misc. Devices MISC New CPAP machine with mask and supplies through [place].  CPAP therapy at 14 cm. water pressure.     ONE TOUCH ULTRA TEST test strip TEST as directed 100 each 3   solifenacin (VESICARE) 5 MG tablet Take 1 tablet (5 mg total) by mouth daily. 90 tablet 0   tamsulosin (FLOMAX) 0.4 MG CAPS capsule Take 1 capsule (0.4 mg  total) by mouth daily after supper. 30 capsule 5   No facility-administered medications prior to visit.    No Known Allergies  ROS Review of Systems    Objective:    Physical Exam  BP (!) 127/56 (BP Location: Left Arm, Patient Position: Sitting, Cuff Size: Normal)    Pulse 79    Temp 98.7 F (37.1 C) (Oral)    Ht 5' 8"  (1.727 m)    Wt 179 lb 1.9 oz (81.2 kg)    SpO2 98%    BMI 27.24 kg/m  Wt Readings from Last 3 Encounters:  12/12/21 179 lb 1.9 oz (81.2 kg)  08/24/21 178 lb (80.7 kg)  07/24/21 180 lb (81.6 kg)     There are no preventive care reminders to display for this patient.  There are no preventive care reminders to display for this patient.  Lab Results  Component Value Date   TSH 1.580 01/25/2015   Lab Results  Component Value Date   WBC 8.1 08/04/2021   HGB 17.5 (H) 08/04/2021   HCT 52.0 (H) 08/04/2021   MCV 96.5 08/04/2021   PLT 263 08/04/2021   Lab Results  Component Value Date   NA 140 07/24/2021   K 4.4 07/24/2021   CO2 26 07/24/2021   GLUCOSE 95 07/24/2021   BUN 13 07/24/2021   CREATININE 0.86 07/24/2021   BILITOT 0.4 07/24/2021   ALKPHOS 61 06/06/2017   AST 21 07/24/2021   ALT 13 07/24/2021   PROT 7.2 07/24/2021   ALBUMIN 4.1 06/06/2017   CALCIUM 10.6 (H) 07/24/2021   EGFR 94 07/24/2021   Lab Results  Component Value Date   CHOL 160 10/17/2020   Lab Results  Component Value Date   HDL 48 10/17/2020   Lab Results  Component Value Date   LDLCALC 84 10/17/2020   Lab Results  Component Value Date   TRIG 190 (H) 10/17/2020   Lab Results  Component Value Date   CHOLHDL 3.3 10/17/2020   Lab Results  Component Value Date   HGBA1C 6.2 (A) 08/24/2021      Assessment & Plan:   Problem List Items Addressed This Visit       Endocrine   Type 2 diabetes  mellitus with periodontal complication ([place])    We will see if we can get a CGM covered for him.  He is on insulin so I do think he would qualify.  He really only eats once a  day        Other   TOBACCO DEPENDENCE    I do think Chantix would be a good option I did warn about potential to affect his mood and to keep an eye out for that dose also warned about nausea and to make sure that he is taking it with food.  Discussed that it is tapered the quit date is set 1 week after starting the medication.  We did discuss that he could also consider using a nicotine lozenge or gum in between if really needed for those more strong cravings for cigarette.  I am glad that he is now avoiding alcohol.  Prescription sent to pharmacy.      Other Visit Diagnoses     Tobacco abuse    -  Primary   Relevant Medications   varenicline (CHANTIX) 1 MG tablet   nicotine polacrilex (SM NICOTINE POLACRILEX) 4 MG lozenge       Meds ordered this encounter  Medications   varenicline (CHANTIX) 1 MG tablet    Sig: Take 0.5 tablets (0.5 mg total) by mouth daily for 3 days, THEN 0.5 tablets (0.5 mg total) 2 (two) times daily for 4 days, THEN 1 tablet (1 mg total) 2 (two) times daily.    Dispense:  180 tablet    Refill:  1   nicotine polacrilex (SM NICOTINE POLACRILEX) 4 MG lozenge    Sig: Take 1 lozenge (4 mg total) by mouth as needed for smoking cessation.    Dispense:  48 tablet    Refill:  1    Ok to substitue if his insurance will cover a similar nicotine replacement product.    Follow-up: No follow-ups on file.    Beatrice Lecher, MD

## 2021-12-12 NOTE — Assessment & Plan Note (Signed)
I do think Chantix would be a good option I did warn about potential to affect his mood and to keep an eye out for that dose also warned about nausea and to make sure that he is taking it with food.  Discussed that it is tapered the quit date is set 1 week after starting the medication.  We did discuss that he could also consider using a nicotine lozenge or gum in between if really needed for those more strong cravings for cigarette.  I am glad that he is now avoiding alcohol.  Prescription sent to pharmacy.

## 2021-12-12 NOTE — Assessment & Plan Note (Addendum)
We will see if we can get a CGM covered for him.  He is on insulin so I do think he would qualify.  He really only eats once a day

## 2021-12-12 NOTE — Telephone Encounter (Signed)
Hi Keesha,   Think we might be able to get a CGM for him?  He is on Toujeo so I thought he might qualify.  He asked me about it during the visit today and I told him that we could probably check into it.  Let me know your thoughts.  If I can just order it that would be great or if I need to get any Authorization just let me know.

## 2021-12-26 ENCOUNTER — Encounter: Payer: Self-pay | Admitting: Family Medicine

## 2021-12-26 ENCOUNTER — Ambulatory Visit (INDEPENDENT_AMBULATORY_CARE_PROVIDER_SITE_OTHER): Payer: Medicare HMO | Admitting: Family Medicine

## 2021-12-26 ENCOUNTER — Other Ambulatory Visit: Payer: Self-pay

## 2021-12-26 VITALS — BP 104/52 | HR 72 | Resp 16 | Ht 68.0 in | Wt 176.0 lb

## 2021-12-26 DIAGNOSIS — I10 Essential (primary) hypertension: Secondary | ICD-10-CM

## 2021-12-26 DIAGNOSIS — Z125 Encounter for screening for malignant neoplasm of prostate: Secondary | ICD-10-CM

## 2021-12-26 DIAGNOSIS — D582 Other hemoglobinopathies: Secondary | ICD-10-CM

## 2021-12-26 DIAGNOSIS — Z794 Long term (current) use of insulin: Secondary | ICD-10-CM

## 2021-12-26 DIAGNOSIS — E11630 Type 2 diabetes mellitus with periodontal disease: Secondary | ICD-10-CM | POA: Diagnosis not present

## 2021-12-26 LAB — POCT GLYCOSYLATED HEMOGLOBIN (HGB A1C): Hemoglobin A1C: 6.6 % — AB (ref 4.0–5.6)

## 2021-12-26 MED ORDER — EMPAGLIFLOZIN 25 MG PO TABS: 25.0000 mg | ORAL_TABLET | Freq: Every day | ORAL | 1 refills | Status: DC

## 2021-12-26 MED ORDER — LISINOPRIL 10 MG PO TABS: 10.0000 mg | ORAL_TABLET | Freq: Every day | ORAL | 0 refills | Status: DC

## 2021-12-26 NOTE — Progress Notes (Signed)
Established Patient Office Visit  Subjective:  Patient ID: Gabriel Gilbert, male    DOB: 1951/02/27  Age: 71 y.o. MRN: [id number]  CC:  Chief Complaint  Patient presents with   Diabetes    Follow up     HPI ROY TOKARZ presents for   Hypertension- Pt denies chest pain, SOB, dizziness, or heart palpitations.  Taking meds as directed w/o problems.  Denies medication side effects.    Diabetes - no hypoglycemic events. No wounds or sores that are not healing well. No increased thirst or urination. Checking glucose at home. Taking medications as prescribed without any side effects.  Still very much interested in continuous glucose meter.  He says he has had some blood sugars as high as 200 but he checks them pretty randomly.  Tobacco abuse-he did start the Chantix maybe a week ago.  He has not set the quit date though and is still smoking.  He has not had any negative side effects.   Past Medical History:  Diagnosis Date   Bell's palsy 12-10   Depression    Diabetes mellitus    type 2   Hyperlipidemia    Hypertension     Past Surgical History:  Procedure Laterality Date   CATARACT EXTRACTION Left    kidney stone removed     pfts     normal    Family History  Problem Relation Age of Onset   COPD Mother    Hypertension Mother    Alzheimer's disease Mother        ?   Diabetes Father    COPD Father    Drug abuse Son    COPD Sister    Cancer Sister        lung    Social History   Socioeconomic History   Marital status: Single    Spouse name: Not on file   Number of children: Not on file   Years of education: Not on file   Highest education level: Not on file  Occupational History   Not on file  Tobacco Use   Smoking status: Every Day    Packs/day: 1.00    Years: 25.00    Pack years: 25.00    Types: Cigarettes    Last attempt to quit: 02/10/2013    Years since quitting: 8.8   Smokeless tobacco: Current    Types: Chew   Tobacco comments:    using  electronic cigarettes  Vaping Use   Vaping Use: Every day  Substance and Sexual Activity   Alcohol use: No    Alcohol/week: 0.0 standard drinks   Drug use: No   Sexual activity: Not Currently    Partners: Female  Other Topics Concern   Not on file  Social History Narrative   Not on file   Social Determinants of Health   Financial Resource Strain: Not on file  Food Insecurity: Not on file  Transportation Needs: Not on file  Physical Activity: Not on file  Stress: Not on file  Social Connections: Not on file  Intimate Partner Violence: Not on file    Outpatient Medications Prior to Visit  Medication Sig Dispense Refill   ALPRAZolam (XANAX) 0.5 MG tablet TAKE ONE TABLET BY MOUTH DAILY AS NEEDED 30 tablet 0   aspirin 81 MG tablet Take 81 mg by mouth daily.     B-D UF III MINI PEN NEEDLES 31G X 5 MM MISC USE FOR INJECTION ONCE DAILY 100 each 9  busPIRone (BUSPAR) 10 MG tablet Take 1 tablet (10 mg total) by mouth 2 (two) times daily. 60 tablet 0   FLUoxetine (PROZAC) 10 MG capsule Take 1 capsule (10 mg total) by mouth daily. Delete prior refills 30 capsule 2   glucose blood (ONE TOUCH TEST STRIPS) test strip Pt testing one time a day. 100 each 3   hydrOXYzine (VISTARIL) 25 MG capsule Take 1-2 capsules (25-50 mg total) by mouth at bedtime as needed. For sleep 45 capsule 0   insulin glargine, 1 Unit Dial, (TOUJEO SOLOSTAR) 300 UNIT/ML Solostar Pen Inject 20-35 Units into the skin at bedtime. 9 mL 1   metFORMIN (GLUCOPHAGE) 1000 MG tablet TAKE ONE TABLET BY MOUTH TWICE A DAY WITH A MEAL 180 tablet 1   Misc. Devices MISC New CPAP machine with mask and supplies through [facility].  CPAP therapy at 14 cm. water pressure.     nicotine polacrilex (SM NICOTINE POLACRILEX) 4 MG lozenge Take 1 lozenge (4 mg total) by mouth as needed for smoking cessation. 48 tablet 1   ONE TOUCH ULTRA TEST test strip TEST as directed 100 each 3   solifenacin (VESICARE) 5 MG tablet Take 1 tablet (5 mg  total) by mouth daily. 90 tablet 0   tamsulosin (FLOMAX) 0.4 MG CAPS capsule Take 1 capsule (0.4 mg total) by mouth daily after supper. 30 capsule 5   varenicline (CHANTIX) 1 MG tablet Take 0.5 tablets (0.5 mg total) by mouth daily for 3 days, THEN 0.5 tablets (0.5 mg total) 2 (two) times daily for 4 days, THEN 1 tablet (1 mg total) 2 (two) times daily. 180 tablet 1   JARDIANCE 10 MG TABS tablet TAKE ONE TABLET BY MOUTH DAILY 90 tablet 1   lisinopril (ZESTRIL) 20 MG tablet TAKE ONE TABLET BY MOUTH DAILY 90 tablet 1   No facility-administered medications prior to visit.    No Known Allergies  ROS Review of Systems    Objective:    Physical Exam Constitutional:      Appearance: Normal appearance. He is well-developed.  HENT:     Head: Normocephalic and atraumatic.  Cardiovascular:     Rate and Rhythm: Normal rate and regular rhythm.     Heart sounds: Normal heart sounds.  Pulmonary:     Effort: Pulmonary effort is normal.     Breath sounds: Normal breath sounds.  Skin:    General: Skin is warm and dry.  Neurological:     Mental Status: He is alert and oriented to person, place, and time. Mental status is at baseline.  Psychiatric:        Behavior: Behavior normal.   BP (!) 104/52    Pulse 72    Resp 16    Ht 5' 8"  (1.727 m)    Wt 176 lb (79.8 kg)    SpO2 97%    BMI 26.76 kg/m  Wt Readings from Last 3 Encounters:  12/26/21 176 lb (79.8 kg)  12/12/21 179 lb 1.9 oz (81.2 kg)  08/24/21 178 lb (80.7 kg)     There are no preventive care reminders to display for this patient.  There are no preventive care reminders to display for this patient.  Lab Results  Component Value Date   TSH 1.580 01/25/2015   Lab Results  Component Value Date   WBC 8.1 08/04/2021   HGB 17.5 (H) 08/04/2021   HCT 52.0 (H) 08/04/2021   MCV 96.5 08/04/2021   PLT 263 08/04/2021   Lab  Results  Component Value Date   NA 140 07/24/2021   K 4.4 07/24/2021   CO2 26 07/24/2021   GLUCOSE 95  07/24/2021   BUN 13 07/24/2021   CREATININE 0.86 07/24/2021   BILITOT 0.4 07/24/2021   ALKPHOS 61 06/06/2017   AST 21 07/24/2021   ALT 13 07/24/2021   PROT 7.2 07/24/2021   ALBUMIN 4.1 06/06/2017   CALCIUM 10.6 (H) 07/24/2021   EGFR 94 07/24/2021   Lab Results  Component Value Date   CHOL 160 10/17/2020   Lab Results  Component Value Date   HDL 48 10/17/2020   Lab Results  Component Value Date   LDLCALC 84 10/17/2020   Lab Results  Component Value Date   TRIG 190 (H) 10/17/2020   Lab Results  Component Value Date   CHOLHDL 3.3 10/17/2020   Lab Results  Component Value Date   HGBA1C 6.6 (A) 12/26/2021      Assessment & Plan:   Problem List Items Addressed This Visit       Cardiovascular and Mediastinum   Essential hypertension, benign - Primary    Blood pressure looks good today in fact is a little bit on the lower end.  We will keep an eye on this.      Relevant Medications   lisinopril (ZESTRIL) 10 MG tablet   Other Relevant Orders   PSA   Lipid panel   COMPLETE METABOLIC PANEL WITH GFR   CBC     Endocrine   Type 2 diabetes mellitus with periodontal complication (HCC)    At goal.  A1c up a little bit at 6.6 today, previous of 6.2.  Continue to work on [employer].  I am to have him try to track his blood sugars before breakfast a couple days a week and bring this back at his next appointment.  We also discussed potentially increasing the Jardiance so that we could decrease his insulin he is also interested in a continuous glucose meter.      Relevant Medications   empagliflozin (JARDIANCE) 25 MG TABS tablet   lisinopril (ZESTRIL) 10 MG tablet   Other Relevant Orders   PSA   Lipid panel   COMPLETE METABOLIC PANEL WITH GFR   CBC   POCT HgB A1C (Completed)   AMB Referral to [facility]   Other Visit Diagnoses     Screening for prostate cancer       Relevant Orders   PSA   Elevated hemoglobin ([place])       Relevant  Orders   CBC       Tobacco abuse-just reminded him that he needs to set his quit date at the end of the first week of smoking and get rid of the cigarettes etc.  Meds ordered this encounter  Medications   empagliflozin (JARDIANCE) 25 MG TABS tablet    Sig: Take 1 tablet (25 mg total) by mouth daily.    Dispense:  90 tablet    Refill:  1   lisinopril (ZESTRIL) 10 MG tablet    Sig: Take 1 tablet (10 mg total) by mouth daily.    Dispense:  90 tablet    Refill:  0    Follow-up: Return in about 3 months (around 03/25/2022) for Diabetes follow-up.    Beatrice Lecher, MD

## 2021-12-26 NOTE — Assessment & Plan Note (Signed)
At goal.  A1c up a little bit at 6.6 today, previous of 6.2.  Continue to work on [employer].  I am to have him try to track his blood sugars before breakfast a couple days a week and bring this back at his next appointment.  We also discussed potentially increasing the Jardiance so that we could decrease his insulin he is also interested in a continuous glucose meter.

## 2021-12-26 NOTE — Patient Instructions (Addendum)
Try to just check your blood pressure before breakfast 2 days a week and write those down for me had like to take a look at them when you come back.  We are going to increase your Jardiance to 25 mg.  When she start the new prescription then you can cut your insulin down to 15 units daily.  Also we will cut your lisinopril down to 10 mg daily since your blood pressures have been a little bit on the lower end.  Sometimes low blood pressure can make you feel tired so want to decrease your dose just slightly.  If you still have a lot of the 20 mg dose you can split them into half, and take half a tab daily.

## 2021-12-26 NOTE — Assessment & Plan Note (Signed)
Blood pressure looks good today in fact is a little bit on the lower end.  We will keep an eye on this.

## 2021-12-27 ENCOUNTER — Telehealth: Payer: Self-pay | Admitting: *Deleted

## 2021-12-27 DIAGNOSIS — Z794 Long term (current) use of insulin: Secondary | ICD-10-CM | POA: Diagnosis not present

## 2021-12-27 DIAGNOSIS — Z125 Encounter for screening for malignant neoplasm of prostate: Secondary | ICD-10-CM | POA: Diagnosis not present

## 2021-12-27 DIAGNOSIS — D582 Other hemoglobinopathies: Secondary | ICD-10-CM | POA: Diagnosis not present

## 2021-12-27 DIAGNOSIS — I10 Essential (primary) hypertension: Secondary | ICD-10-CM | POA: Diagnosis not present

## 2021-12-27 DIAGNOSIS — E11630 Type 2 diabetes mellitus with periodontal disease: Secondary | ICD-10-CM | POA: Diagnosis not present

## 2021-12-27 NOTE — Chronic Care Management (AMB) (Signed)
Chronic Care Management  ° °Note ° °12/27/2021 °Name: Gabriel Gilbert MRN: [id number] DOB: 01/23/1951 ° °Gabriel Gilbert is a 70 y.o. year old male who is a primary care patient of Metheney, Catherine D, MD. I reached out to Gabriel Gilbert by phone today in response to a referral sent by Gabriel Gilbert's PCP. ° °Gabriel Gilbert was given information about Chronic Care Management services today including:  °CCM service includes personalized support from designated clinical staff supervised by his physician, including individualized plan of care and coordination with other care providers °24/7 contact phone numbers for assistance for urgent and routine care needs. °Service will only be billed when office clinical staff spend 20 minutes or more in a month to coordinate care. °Only one practitioner may furnish and bill the service in a calendar month. °The patient may stop CCM services at any time (effective at the end of the month) by phone call to the office staff. °The patient is responsible for co-pay (up to 20% after annual deductible is met) if co-pay is required by the individual health plan.  ° °Patient agreed to services and verbal consent obtained.  ° °Follow up plan: °Face to Face appointment with care management team member scheduled for:  01/03/2022 ° ° , CCMA °Care Guide, Embedded Care Coordination °[place]   Care Management  °Direct Dial: [phone] ° ° °

## 2021-12-28 LAB — COMPLETE METABOLIC PANEL WITH GFR
AG Ratio: 1.9 (calc) (ref 1.0–2.5)
ALT: 11 U/L (ref 9–46)
AST: 14 U/L (ref 10–35)
Albumin: 4.3 g/dL (ref 3.6–5.1)
Alkaline phosphatase (APISO): 62 U/L (ref 35–144)
BUN: 17 mg/dL (ref 7–25)
CO2: 21 mmol/L (ref 20–32)
Calcium: 9.7 mg/dL (ref 8.6–10.3)
Chloride: 101 mmol/L (ref 98–110)
Creat: 0.99 mg/dL (ref 0.70–1.35)
Globulin: 2.3 g/dL (calc) (ref 1.9–3.7)
Glucose, Bld: 153 mg/dL — ABNORMAL HIGH (ref 65–139)
Potassium: 4.4 mmol/L (ref 3.5–5.3)
Sodium: 136 mmol/L (ref 135–146)
Total Bilirubin: 0.4 mg/dL (ref 0.2–1.2)
Total Protein: 6.6 g/dL (ref 6.1–8.1)
eGFR: 82 mL/min/{1.73_m2} (ref >=60)

## 2021-12-28 LAB — CBC
HCT: 49.3 % (ref 38.5–50.0)
Hemoglobin: 17 g/dL (ref 13.2–17.1)
MCH: 31.1 pg (ref 27.0–33.0)
MCHC: 34.5 g/dL (ref 32.0–36.0)
MCV: 90.1 fL (ref 80.0–100.0)
MPV: 10.7 fL (ref 7.5–12.5)
Platelets: 262 10*3/uL (ref 140–400)
RBC: 5.47 10*6/uL (ref 4.20–5.80)
RDW: 13.1 % (ref 11.0–15.0)
WBC: 9.1 10*3/uL (ref 3.8–10.8)

## 2021-12-28 LAB — PSA: PSA: 1.7 ng/mL (ref <=4.00)

## 2021-12-28 LAB — LIPID PANEL
Cholesterol: 141 mg/dL (ref <200)
HDL: 35 mg/dL — ABNORMAL LOW (ref >=40)
LDL Cholesterol (Calc): 72 mg/dL (calc)
Non-HDL Cholesterol (Calc): 106 mg/dL (calc) (ref <130)
Total CHOL/HDL Ratio: 4 (calc) (ref <5.0)
Triglycerides: 247 mg/dL — ABNORMAL HIGH (ref <150)

## 2021-12-28 NOTE — Progress Notes (Signed)
Hi Gabriel Gilbert, total cholesterol and LDL look good.  Triglycerides are up compared to last year so just continue to work on [employer].  Metabolic panel looks good.  State test is normal.  Blood counts normal as well.

## 2021-12-29 ENCOUNTER — Telehealth: Payer: Self-pay | Admitting: Pharmacist

## 2021-12-29 NOTE — Chronic Care Management (AMB) (Signed)
Chronic Care Management Pharmacy Assistant   Name: Gabriel Gilbert  MRN: [id number] DOB: 1951-02-24  Gabriel Gilbert is an 71 y.o. year old male who presents for his initial CCM visit with the clinical pharmacist.  Recent office visits:  12/26/21-Gabriel D. [name], MD (PCP) Diabetic follow up visit. Labs ordered. AMB Referral to [facility]. Follow up in 3 months.  12/02/21-Gabriel D. [name], MD (PCP) Seen for new medication request. Start on Chantix 1 mg.  08/24/21-Gabriel D. [name], MD (PCP) Seen for diabetic follow up. Follow up in 4 months.  07/24/21-Gabriel D. Metheney, MD (PCP) Seen for a diabetic and hypertension follow up. Flu vaccine given. Labs ordered.   Recent consult visits:  10/12/21-Gabriel Gilbert (Urology) Seen for urinary frequency.  09/19/21-Gabriel Gilbert (Pulmonology) CT of chest ordered. 1017/22-Gabriel Gilmore Laroche, MD (Behavioral health) Notes not available.   Hospital visits:  None in previous 6 months  Medications: Outpatient Encounter Medications as of 12/29/2021  Medication Sig   ALPRAZolam (XANAX) 0.5 MG tablet TAKE ONE TABLET BY MOUTH DAILY AS NEEDED   aspirin 81 MG tablet Take 81 mg by mouth daily.   B-D UF III MINI PEN NEEDLES 31G X 5 MM MISC USE FOR INJECTION ONCE DAILY   busPIRone (BUSPAR) 10 MG tablet Take 1 tablet (10 mg total) by mouth 2 (two) times daily.   empagliflozin (JARDIANCE) 25 MG TABS tablet Take 1 tablet (25 mg total) by mouth daily.   FLUoxetine (PROZAC) 10 MG capsule Take 1 capsule (10 mg total) by mouth daily. Delete prior refills   glucose blood (ONE TOUCH TEST STRIPS) test strip Pt testing one time a day.   hydrOXYzine (VISTARIL) 25 MG capsule Take 1-2 capsules (25-50 mg total) by mouth at bedtime as needed. For sleep   insulin glargine, 1 Unit Dial, (TOUJEO SOLOSTAR) 300 UNIT/ML Solostar Pen Inject 20-35 Units into the skin at bedtime.   lisinopril (ZESTRIL) 10 MG tablet Take 1 tablet (10 mg total) by mouth  daily.   metFORMIN (GLUCOPHAGE) 1000 MG tablet TAKE ONE TABLET BY MOUTH TWICE A DAY WITH A MEAL   Misc. Devices MISC New CPAP machine with mask and supplies through [facility].  CPAP therapy at 14 cm. water pressure.   nicotine polacrilex (SM NICOTINE POLACRILEX) 4 MG lozenge Take 1 lozenge (4 mg total) by mouth as needed for smoking cessation.   ONE TOUCH ULTRA TEST test strip TEST as directed   solifenacin (VESICARE) 5 MG tablet Take 1 tablet (5 mg total) by mouth daily.   tamsulosin (FLOMAX) 0.4 MG CAPS capsule Take 1 capsule (0.4 mg total) by mouth daily after supper.   varenicline (CHANTIX) 1 MG tablet Take 0.5 tablets (0.5 mg total) by mouth daily for 3 days, THEN 0.5 tablets (0.5 mg total) 2 (two) times daily for 4 days, THEN 1 tablet (1 mg total) 2 (two) times daily.   No facility-administered encounter medications on file as of 12/29/2021.   ALPRAZolam (XANAX) 0.5 MG tablet Last filled:12/11/21 30 DS aspirin 81 MG tablet Last filled:None noted busPIRone (BUSPAR) 10 MG tablet Last filled:07/31/21 30 DS empagliflozin (JARDIANCE) 25 MG TABS tablet Last filled:12/26/21 90 DS FLUoxetine (PROZAC) 10 MG capsule Last filled:08/28/21 30 DS hydrOXYzine (VISTARIL) 25 MG capsule Last filled:10/27/21 22 DS insulin glargine, 1 Unit Dial, (TOUJEO SOLOSTAR) 300 UNIT/ML Solostar Pen Last filled:11/07/21 77 DS lisinopril (ZESTRIL) 10 MG tablet Last filled:12/26/21 90 DS metFORMIN (GLUCOPHAGE) 1000 MG tablet Last filled:12/22/21 90 DS nicotine polacrilex (SM NICOTINE  POLACRILEX) 4 MG lozenge Last filled:None noted solifenacin (VESICARE) 5 MG tablet Last filled:09/13/21 90 DS tamsulosin (FLOMAX) 0.4 MG CAPS capsule Last filled:09/13/21 90 DS varenicline (CHANTIX) 1 MG tablet Last filled:12/12/21 28 DS  Care Gaps: None noted  Star Rating Drugs: empagliflozin (JARDIANCE) 25 MG TABS tablet Last filled:12/26/21 90 DS insulin glargine, 1 Unit Dial, (TOUJEO SOLOSTAR) 300 UNIT/ML Solostar Pen Last  filled:11/07/21 77 DS metFORMIN (GLUCOPHAGE) 1000 MG tablet Last filled:12/22/21 90 DS  Gabriel Gilbert, RMA Health Concierge

## 2022-01-01 ENCOUNTER — Telehealth (INDEPENDENT_AMBULATORY_CARE_PROVIDER_SITE_OTHER): Payer: Medicare HMO | Admitting: Psychiatry

## 2022-01-01 ENCOUNTER — Encounter (HOSPITAL_COMMUNITY): Payer: Self-pay | Admitting: Psychiatry

## 2022-01-01 DIAGNOSIS — F331 Major depressive disorder, recurrent, moderate: Secondary | ICD-10-CM | POA: Diagnosis not present

## 2022-01-01 DIAGNOSIS — F411 Generalized anxiety disorder: Secondary | ICD-10-CM

## 2022-01-01 MED ORDER — ALPRAZOLAM 0.5 MG PO TABS: ORAL_TABLET | ORAL | 0 refills | Status: DC

## 2022-01-01 NOTE — Progress Notes (Signed)
[name] Outpatient Follow up visit  Telepsych Patient Identification: Gabriel Gilbert MRN:  [id number] Date of Evaluation:  01/01/2022 Referral Source: primary care.  Chief Complaint:    anxiety Visit Diagnosis:    ICD-10-CM   1. Major depressive disorder, recurrent episode, moderate (HCC)  F33.1     2. GAD (generalized anxiety disorder)  F41.1       Virtual Visit via Telephone Note  I connected with [name] on 01/01/22 at 10:30 AM EST by telephone and verified that I am speaking with the correct person using two identifiers.  Location: Patient: home Provider: home office   I discussed the limitations, risks, security and privacy concerns of performing an evaluation and management service by telephone and the availability of in person appointments. I also discussed with the patient that there may be a patient responsible charge related to this service. The patient expressed understanding and agreed to proceed.      I discussed the assessment and treatment plan with the patient. The patient was provided an opportunity to ask questions and all were answered. The patient agreed with the plan and demonstrated an understanding of the instructions.   The patient was advised to call back or seek an in-person evaluation if the symptoms worsen or if the condition fails to improve as anticipated.  I provided 11 minutes of non-face-to-face time during this encounter.   Gabriel Capron, MD   HPI:  Stays at home, mostly watches TV , doesn't like ot do video so kept audio Didn't want to continue prozac says only xanax helps and he is fine with that Understands not to do early refills  Understands to abstain from alcohol and its effect   Motivation remains low seldom goes out or does grossly when he goes out    Aggravating factor: lonliness ,modifying factor: dog,  Some friends.  Timing in morning   Past Psychiatric History: depression ,anxiety  Previous Psychotropic  Medications: Yes   Substance Abuse History in the last 12 months:  No.  Consequences of Substance Abuse: NA  Past Medical History:  Past Medical History:  Diagnosis Date   Bell's palsy 12-10   Depression    Diabetes mellitus    type 2   Hyperlipidemia    Hypertension     Past Surgical History:  Procedure Laterality Date   CATARACT EXTRACTION Left    kidney stone removed     pfts     normal    Family Psychiatric History: anxiety. Mom used valium  Family History:  Family History  Problem Relation Age of Onset   COPD Mother    Hypertension Mother    Alzheimer's disease Mother        ?   Diabetes Father    COPD Father    Drug abuse Son    COPD Sister    Cancer Sister        lung    Social History:   Social History   Socioeconomic History   Marital status: Single    Spouse name: Not on file   Number of children: Not on file   Years of education: Not on file   Highest education level: Not on file  Occupational History   Not on file  Tobacco Use   Smoking status: Every Day    Packs/day: 1.00    Years: 25.00    Pack years: 25.00    Types: Cigarettes    Last attempt to quit: 02/10/2013    Years  since quitting: 8.8   Smokeless tobacco: Current    Types: Chew   Tobacco comments:    using electronic cigarettes  Vaping Use   Vaping Use: Every day  Substance and Sexual Activity   Alcohol use: No    Alcohol/week: 0.0 standard drinks   Drug use: No   Sexual activity: Not Currently    Partners: Female  Other Topics Concern   Not on file  Social History Narrative   Not on file   Social Determinants of Health   Financial Resource Strain: Not on file  Food Insecurity: Not on file  Transportation Needs: Not on file  Physical Activity: Not on file  Stress: Not on file  Social Connections: Not on file      Allergies:  No Known Allergies  Metabolic Disorder Labs: Lab Results  Component Value Date   HGBA1C 6.6 (A) 12/26/2021   No results found  for: PROLACTIN Lab Results  Component Value Date   CHOL 141 12/27/2021   TRIG 247 (H) 12/27/2021   HDL 35 (L) 12/27/2021   CHOLHDL 4.0 12/27/2021   VLDL 22 01/17/2016   LDLCALC 72 12/27/2021   LDLCALC 84 10/17/2020     Current Medications: Current Outpatient Medications  Medication Sig Dispense Refill   ALPRAZolam (XANAX) 0.5 MG tablet TAKE ONE TABLET BY MOUTH DAILY AS NEEDED 30 tablet 0   aspirin 81 MG tablet Take 81 mg by mouth daily.     B-D UF III MINI PEN NEEDLES 31G X 5 MM MISC USE FOR INJECTION ONCE DAILY 100 each 9   busPIRone (BUSPAR) 10 MG tablet Take 1 tablet (10 mg total) by mouth 2 (two) times daily. 60 tablet 0   empagliflozin (JARDIANCE) 25 MG TABS tablet Take 1 tablet (25 mg total) by mouth daily. 90 tablet 1   FLUoxetine (PROZAC) 10 MG capsule Take 1 capsule (10 mg total) by mouth daily. Delete prior refills 30 capsule 2   glucose blood (ONE TOUCH TEST STRIPS) test strip Pt testing one time a day. 100 each 3   hydrOXYzine (VISTARIL) 25 MG capsule Take 1-2 capsules (25-50 mg total) by mouth at bedtime as needed. For sleep 45 capsule 0   insulin glargine, 1 Unit Dial, (TOUJEO SOLOSTAR) 300 UNIT/ML Solostar Pen Inject 20-35 Units into the skin at bedtime. 9 mL 1   lisinopril (ZESTRIL) 10 MG tablet Take 1 tablet (10 mg total) by mouth daily. 90 tablet 0   metFORMIN (GLUCOPHAGE) 1000 MG tablet TAKE ONE TABLET BY MOUTH TWICE A DAY WITH A MEAL 180 tablet 1   Misc. Devices MISC New CPAP machine with mask and supplies through [facility].  CPAP therapy at 14 cm. water pressure.     nicotine polacrilex (SM NICOTINE POLACRILEX) 4 MG lozenge Take 1 lozenge (4 mg total) by mouth as needed for smoking cessation. 48 tablet 1   ONE TOUCH ULTRA TEST test strip TEST as directed 100 each 3   solifenacin (VESICARE) 5 MG tablet Take 1 tablet (5 mg total) by mouth daily. 90 tablet 0   tamsulosin (FLOMAX) 0.4 MG CAPS capsule Take 1 capsule (0.4 mg total) by mouth daily after supper.  30 capsule 5   varenicline (CHANTIX) 1 MG tablet Take 0.5 tablets (0.5 mg total) by mouth daily for 3 days, THEN 0.5 tablets (0.5 mg total) 2 (two) times daily for 4 days, THEN 1 tablet (1 mg total) 2 (two) times daily. 180 tablet 1   No current facility-administered medications  for this visit.      Psychiatric Specialty Exam: Review of Systems  Cardiovascular:  Negative for chest pain and palpitations.  Psychiatric/Behavioral:  Negative for suicidal ideas.    There were no vitals taken for this visit.There is no height or weight on file to calculate BMI.  General Appearance:   Eye Contact:    Speech:  Slow  Volume:  Decreased  Mood: fair  Affect:   Thought Process:  Goal Directed  Orientation:  Full (Time, Place, and Person)  Thought Content:  Rumination  Suicidal Thoughts:  No  Homicidal Thoughts:  No  Memory:  Immediate;   Fair Recent;   Fair  Judgement:  Poor  Insight:  Shallow  Psychomotor Activity:  Decreased  Concentration:  Concentration: Fair and Attention Span: Fair  Recall:  [employer] of Knowledge:Fair  Language: Fair  Akathisia:  Negative  Handed:  Right  AIMS (if indicated):  0  Assets:  Desire for Improvement  ADL's:  Intact  Cognition: WNL  Sleep:  fair    Treatment Plan Summary: Medication management and Plan as follows    Prior documentation reviewed  1. Major depression moderate recurrent: somewhat subdued but doenst want to be on any other med for depression Keeps busy with tV or things at home  2.GAD: fluctuates, xanax helps, will continue one per day  Understands to abstain from alcohol and its effect on depression, amotivation Fu 40m  Gabriel Capron, MD 2/20/202310:45 AM

## 2022-01-03 ENCOUNTER — Other Ambulatory Visit: Payer: Self-pay

## 2022-01-03 ENCOUNTER — Telehealth: Payer: Self-pay | Admitting: Pharmacist

## 2022-01-03 ENCOUNTER — Ambulatory Visit (INDEPENDENT_AMBULATORY_CARE_PROVIDER_SITE_OTHER): Payer: Medicare HMO | Admitting: Pharmacist

## 2022-01-03 DIAGNOSIS — Z720 Tobacco use: Secondary | ICD-10-CM

## 2022-01-03 DIAGNOSIS — E11630 Type 2 diabetes mellitus with periodontal disease: Secondary | ICD-10-CM

## 2022-01-03 DIAGNOSIS — I10 Essential (primary) hypertension: Secondary | ICD-10-CM

## 2022-01-03 MED ORDER — FREESTYLE LIBRE 2 SENSOR MISC: 1.0000 [IU] | 3 refills | Status: DC

## 2022-01-03 MED ORDER — FREESTYLE LIBRE 2 READER DEVI: 1.0000 [IU] | Freq: Every day | 99 refills | Status: DC

## 2022-01-03 NOTE — Progress Notes (Signed)
Meds ordered this encounter  Medications   Continuous Blood Gluc Receiver (FREESTYLE LIBRE 2 READER) DEVI    Sig: 1 Units by Does not apply route daily.    Dispense:  1 each    Refill:  PRN   Continuous Blood Gluc Sensor (FREESTYLE LIBRE 2 SENSOR) MISC    Sig: 1 Units by Does not apply route every 14 (fourteen) days.    Dispense:  6 each    Refill:  3

## 2022-01-03 NOTE — Progress Notes (Signed)
Chronic Care Management Pharmacy Assistant   Name: MERLON ALCORTA  MRN: [id number] DOB: 03/22/1951  Gabriel Gilbert is an 71 y.o. year old male who presents for his initial CCM visit with the clinical pharmacist.  Reason for Encounter: Initial Visit   Recent office visits:  08/24/21 [name], MD-PCP (Diabetes) A1c ordered, no medication changes  07/24/21 [name], MD-PCP (Diabetes) Blood work ordered, no medication changes  Recent consult visits:  None ID  Hospital visits:  None in previous 6 months  Medications: Outpatient Encounter Medications as of 01/03/2022  Medication Sig   ALPRAZolam (XANAX) 0.5 MG tablet TAKE ONE TABLET BY MOUTH DAILY AS NEEDED   aspirin 81 MG tablet Take 81 mg by mouth daily.   B-D UF III MINI PEN NEEDLES 31G X 5 MM MISC USE FOR INJECTION ONCE DAILY   busPIRone (BUSPAR) 10 MG tablet Take 1 tablet (10 mg total) by mouth 2 (two) times daily.   empagliflozin (JARDIANCE) 25 MG TABS tablet Take 1 tablet (25 mg total) by mouth daily.   FLUoxetine (PROZAC) 10 MG capsule Take 1 capsule (10 mg total) by mouth daily. Delete prior refills   glucose blood (ONE TOUCH TEST STRIPS) test strip Pt testing one time a day.   hydrOXYzine (VISTARIL) 25 MG capsule Take 1-2 capsules (25-50 mg total) by mouth at bedtime as needed. For sleep   insulin glargine, 1 Unit Dial, (TOUJEO SOLOSTAR) 300 UNIT/ML Solostar Pen Inject 20-35 Units into the skin at bedtime.   lisinopril (ZESTRIL) 10 MG tablet Take 1 tablet (10 mg total) by mouth daily.   metFORMIN (GLUCOPHAGE) 1000 MG tablet TAKE ONE TABLET BY MOUTH TWICE A DAY WITH A MEAL   Misc. Devices MISC New CPAP machine with mask and supplies through [facility].  CPAP therapy at 14 cm. water pressure.   nicotine polacrilex (SM NICOTINE POLACRILEX) 4 MG lozenge Take 1 lozenge (4 mg total) by mouth as needed for smoking cessation.   ONE TOUCH ULTRA TEST test strip TEST as directed   solifenacin  (VESICARE) 5 MG tablet Take 1 tablet (5 mg total) by mouth daily.   tamsulosin (FLOMAX) 0.4 MG CAPS capsule Take 1 capsule (0.4 mg total) by mouth daily after supper.   varenicline (CHANTIX) 1 MG tablet Take 0.5 tablets (0.5 mg total) by mouth daily for 3 days, THEN 0.5 tablets (0.5 mg total) 2 (two) times daily for 4 days, THEN 1 tablet (1 mg total) 2 (two) times daily.   No facility-administered encounter medications on file as of 01/03/2022.   Medication List: ALPRAZolam (XANAX) 0.5 MG tablet-last fill 12/11/21 30 ds aspirin 81 MG tablet B-D UF III MINI PEN NEEDLES 31G X 5 MM MISC busPIRone (BUSPAR) 10 MG tablet-last fill 07/31/21 30 ds empagliflozin (JARDIANCE) 25 MG TABS tablet-last fill 12/26/21 90 ds FLUoxetine (PROZAC) 10 MG capsule-last fill 08/28/21 30 ds glucose blood (ONE TOUCH TEST STRIPS) test strip hydrOXYzine (VISTARIL) 25 MG capsule-last fill 10/27/21 22 ds insulin glargine, 1 Unit Dial, (TOUJEO SOLOSTAR) 300UNIT/ML Solostar Pen-last fill 11/07/21 77 ds lisinopril (ZESTRIL) 10 MG tablet-last fill 12/26/21 90 ds metFORMIN (GLUCOPHAGE) 1000 MG tablet-last fill 12/22/21 90 ds Misc. Devices MISC nicotine polacrilex (SM NICOTINE POLACRILEX) 4 MG lozenge ONE TOUCH ULTRA TEST test strip solifenacin (VESICARE) 5 MG tablet-last fill 09/13/21 90 ds tamsulosin (FLOMAX) 0.4 MG CAPS capsule-last fill 09/13/21 90 ds varenicline (CHANTIX) 1 MG tablet-last fill 12/12/21 28 ds  Care Gaps: Colonoscopy-NA Diabetic Foot Exam-NA Ophthalmology-NA  Dexa Scan - NA Annual Well Visit - NA Micro albumin-01/13/15 Hemoglobin A1c- 12/26/21  Star Rating Drugs: lisinopril (ZESTRIL) 10 MG tablet-last fill 12/26/21 90 ds metFORMIN (GLUCOPHAGE) 1000 MG tablet-last fill 12/22/21 90 ds empagliflozin (JARDIANCE) 25 MG TABS tablet-last fill 12/26/21 90 ds  Velvet Bathe Clinical Pharmacist Assistant [phone]

## 2022-01-03 NOTE — Patient Instructions (Signed)
Visit Information   Thank you for taking time to visit with me today. Please don't hesitate to contact me if I can be of assistance to you before our next scheduled telephone appointment.  Following are the goals we discussed today:  Patient Goals/Self-Care Activities Over the next 90 days, patient will:  take medications as prescribed and check glucose daily, document, and provide at future appointments  Follow Up Plan: The patient has been provided with contact information for the care management team and has been advised to call with any health related questions or concerns.   Please call the care guide team at [phone] if you need to cancel or reschedule your appointment.   Following is a copy of your full care plan:  Care Plan : Medication Management  Updates made by Darius Bump, RPH since 01/03/2022 12:00 AM     Problem: DM, HTN, tobacco use      Long-Range Goal: Disease Progression Prevention   Start Date: 01/03/2022  This Visit's Progress: On track  Priority: High  Note:   Current Barriers:  None at present, though patient desires optimized  glucose monitoring  Pharmacist Clinical Goal(s):  Over the next 90 days, patient will adhere to plan to optimize therapeutic regimen for diabetes as evidenced by report of adherence to recommended medication management changes through collaboration with PharmD and provider.   Interventions: 1:1 collaboration with Hali Marry, MD regarding development and update of comprehensive plan of care as evidenced by provider attestation and co-signature Inter-disciplinary care team collaboration (see longitudinal plan of care) Comprehensive medication review performed; medication list updated in electronic medical record  Diabetes:  Controlled; current treatment:jardiance 61m daily, toujeo 10 units nightly, metformin 1g BID; a1c 6.6  Current glucose readings: checks once daily but very sporadically, not at same time of day,  runs ~200 per patient  Denies hypoglycemic/hyperglycemic symptoms  Current meal patterns: breakfast: skips; lunch: skips; dinner: vegetables about 5pm, not much meat, tries to eat vegetarian; snacks: fritos in PM; drinks: unsweet tea, iced coffee in morning  [place]  Recommended continue current regimen, patient desired CGM device, but cell phone is not compatible with Libre 2 or 3 device unfortunately, so we are unable to utilize CGM sample today. Patient still requests we send RX to pharmacy. I have informed him of cost, and insurance company denied attempted order through durable medical equipment.,  Hypertension:  Controlled; current treatment:lisinopril 162mdaily ;   Current home readings: not currently checking   Denies hypotensive/hypertensive symptoms  Recommended continue current regimen,  Tobacco Abuse:  1 packs per day; 10-15 years of use; does smoke within 30 minutes of waking up  Previous quit attempts: unsuccessful using chantix alone (prescribed nicotine lozenges but did not use)    Triggers to smoke: stress  Motivation to quit smoking:  On a scale of 1-10, how IMPORTANT is it for you to quit smoking: 9   On a scale of 1-10, how CONFIDENT are you that you can quit smoking: 2  Counseled on likelihood of success needing 2 agents to quit, and current status of tobacco use Recommended patient reach back out if he desires to make another quit attempt.  Patient Goals/Self-Care Activities Over the next 90 days, patient will:  take medications as prescribed and check glucose daily, document, and provide at future appointments  Follow Up Plan: The patient has been provided with contact information for the care management team and has been advised  to call with any health related questions or concerns.      Consent to CCM Services: Mr. Macon was given information about Chronic Care Management services including:  CCM service  includes personalized support from designated clinical staff supervised by his physician, including individualized plan of care and coordination with other care providers 24/7 contact phone numbers for assistance for urgent and routine care needs. Service will only be billed when office clinical staff spend 20 minutes or more in a month to coordinate care. Only one practitioner may furnish and bill the service in a calendar month. The patient may stop CCM services at any time (effective at the end of the month) by phone call to the office staff. The patient will be responsible for cost sharing (co-pay) of up to 20% of the service fee (after annual deductible is met).  Patient agreed to services and verbal consent obtained.   Patient verbalizes understanding of instructions and care plan provided today and agrees to view in [place]. Active MyChart status confirmed with patient.

## 2022-01-03 NOTE — Addendum Note (Signed)
Addended by: Nani Gasser D on: 01/03/2022 05:07 PM   Modules accepted: Orders

## 2022-01-03 NOTE — Progress Notes (Signed)
Chronic Care Management Pharmacy Note  01/03/2022 Name:  Gabriel Gilbert MRN:  [id number] DOB:  1951-09-28  Summary: addressed DM, HTN, tobacco use. Patient has recently failed a quit attempt for tobacco. Offered encouragement & guidance for if/when he desires a future quit attempt. Patient's main desire/concern is to use a CGM device for glucose monitoring.  Recommendations/Changes made from today's visit: Recommended continue current regimen, patient desired CGM device, but cell phone is not compatible with Libre 2 or 3 device unfortunately, so we are unable to utilize CGM sample today.  Patient still requests we send RX to pharmacy. I have informed him of cost, and insurance company denied attempted order through durable medical equipment.  Plan: advised patient to maintain contact with PCP, reach out with any questions/concerns/ or desire for support with tobacco cessation.  Subjective: Gabriel Gilbert is an 71 y.o. year old male who is a primary patient of Metheney, Rene Kocher, MD.  The CCM team was consulted for assistance with disease management and care coordination needs.    Engaged with patient face to face for initial visit in response to provider referral for pharmacy case management and/or care coordination services.   Consent to Services:  The patient was given information about Chronic Care Management services, agreed to services, and gave verbal consent prior to initiation of services.  Please see initial visit note for detailed documentation.   Patient Care Team: Hali Marry, MD as PCP - General (Family Medicine) Merian Capron, MD as Consulting Physician (Psychiatry) Darius Bump, [facility] as Pharmacist (Pharmacist) Darius Bump, [facility] (Pharmacist)  Recent office visits:  08/24/21 Hali Marry, MD-PCP (Diabetes) A1c ordered, no medication changes   07/24/21 Hali Marry, MD-PCP (Diabetes) Blood work ordered, no medication changes    Recent consult visits:  None ID   Hospital visits:  None in previous 6 months  Objective:  Lab Results  Component Value Date   CREATININE 0.99 12/27/2021   CREATININE 0.86 07/24/2021   CREATININE 0.76 02/14/2021    Lab Results  Component Value Date   HGBA1C 6.6 (A) 12/26/2021   Last diabetic Eye exam:  Lab Results  Component Value Date/Time   HMDIABEYEEXA no retinopathy- Dr Kenton Kingfisher 05/05/2010 12:00 AM    Last diabetic Foot exam:  Lab Results  Component Value Date/Time   HMDIABFOOTEX yes 01/02/2010 12:00 AM        Component Value Date/Time   CHOL 141 12/27/2021 0000   TRIG 247 (H) 12/27/2021 0000   HDL 35 (L) 12/27/2021 0000   CHOLHDL 4.0 12/27/2021 0000   VLDL 22 01/17/2016 1133   LDLCALC 72 12/27/2021 0000    Hepatic Function Latest Ref Rng & Units 12/27/2021 07/24/2021 02/14/2021  Total Protein 6.1 - 8.1 g/dL 6.6 7.2 6.8  Albumin 3.6 - 5.1 g/dL - - -  AST 10 - 35 U/L 14 21 18   ALT 9 - 46 U/L 11 13 15   Alk Phosphatase 40 - 115 U/L - - -  Total Bilirubin 0.2 - 1.2 mg/dL 0.4 0.4 0.4    Lab Results  Component Value Date/Time   TSH 1.580 01/25/2015 11:34 AM   TSH 2.208 09/16/2006 03:16 PM    CBC Latest Ref Rng & Units 12/27/2021 08/04/2021 07/24/2021  WBC 3.8 - 10.8 Thousand/uL 9.1 8.1 7.9  Hemoglobin 13.2 - 17.1 g/dL 17.0 17.5(H) 18.7(H)  Hematocrit 38.5 - 50.0 % 49.3 52.0(H) 54.6(H)  Platelets 140 - 400 Thousand/uL 262 263 278    No  results found for: VD25OH  Clinical ASCVD: Yes  The 10-year ASCVD risk score (Arnett DK, et al., 2019) is: 30.1%   Values used to calculate the score:     Age: 31 years     Sex: Male     Is Non-Hispanic African American: No     Diabetic: Yes     Tobacco smoker: Yes     Systolic Blood Pressure: 725 mmHg     Is BP treated: Yes     HDL Cholesterol: 35 mg/dL     Total Cholesterol: 141 mg/dL    Other: (CHADS2VASc if Afib, PHQ9 if depression, MMRC or CAT for COPD, ACT, DEXA)  Social History   Tobacco Use  Smoking Status  Every Day   Packs/day: 1.00   Years: 25.00   Pack years: 25.00   Types: Cigarettes   Last attempt to quit: 02/10/2013   Years since quitting: 8.9  Smokeless Tobacco Current   Types: Chew  Tobacco Comments   using electronic cigarettes   BP Readings from Last 3 Encounters:  12/26/21 (!) 104/52  12/12/21 (!) 127/56  08/24/21 (!) 103/53   Pulse Readings from Last 3 Encounters:  12/26/21 72  12/12/21 79  08/24/21 66   Wt Readings from Last 3 Encounters:  12/26/21 176 lb (79.8 kg)  12/12/21 179 lb 1.9 oz (81.2 kg)  08/24/21 178 lb (80.7 kg)    Assessment: Review of patient past medical history, allergies, medications, health status, including review of consultants reports, laboratory and other test data, was performed as part of comprehensive evaluation and provision of chronic care management services.   SDOH:  (Social Determinants of Health) assessments and interventions performed:    CCM Care Plan  No Known Allergies  Medications Reviewed Today     Reviewed by Darius Bump, [facility] (Pharmacist) on 01/03/22 at 1408  Med List Status: <None>   Medication Order Taking? Sig Documenting Provider Last Dose Status Informant  ALPRAZolam (XANAX) 0.5 MG tablet [id number] Yes TAKE ONE TABLET BY MOUTH DAILY AS NEEDED Merian Capron, MD Taking Active   aspirin 81 MG tablet [id number] Yes Take 81 mg by mouth daily. [provider] Taking Active   B-D UF III MINI PEN NEEDLES 31G X 5 MM MISC [id number] Yes USE FOR INJECTION ONCE DAILY Hali Marry, MD Taking Active   busPIRone (BUSPAR) 10 MG tablet [id number] Yes Take 1 tablet (10 mg total) by mouth 2 (two) times daily. Merian Capron, MD Taking Active   empagliflozin (JARDIANCE) 25 MG TABS tablet [id number] Yes Take 1 tablet (25 mg total) by mouth daily. Hali Marry, MD Taking Active   FLUoxetine (PROZAC) 10 MG capsule [id number] Yes Take 1 capsule (10 mg total) by mouth daily. Delete prior refills Merian Capron, MD  Taking Active   glucose blood (ONE TOUCH TEST STRIPS) test strip [id number] Yes Pt testing one time a day. Hali Marry, MD Taking Active   hydrOXYzine (VISTARIL) 25 MG capsule [id number] Yes Take 1-2 capsules (25-50 mg total) by mouth at bedtime as needed. For sleep Hali Marry, MD Taking Active   insulin glargine, 1 Unit Dial, (TOUJEO SOLOSTAR) 300 UNIT/ML Solostar Pen [id number] Yes Inject 20-35 Units into the skin at bedtime. Hali Marry, MD Taking Active            Med Note Dorene Ar Jan 03, 2022  2:08 PM) Taking 10 units nightly as of 01/03/22 review.  lisinopril (ZESTRIL) 10 MG tablet [id number]  Yes Take 1 tablet (10 mg total) by mouth daily. Hali Marry, MD Taking Active   metFORMIN (GLUCOPHAGE) 1000 MG tablet [id number] Yes TAKE ONE TABLET BY MOUTH TWICE A DAY WITH A MEAL Hali Marry, MD Taking Active   Misc. Devices MISC [id number] Yes New CPAP machine with mask and supplies through [facility].  CPAP therapy at 14 cm. water pressure. [provider] Taking Active   nicotine polacrilex (SM NICOTINE POLACRILEX) 4 MG lozenge [id number] No Take 1 lozenge (4 mg total) by mouth as needed for smoking cessation.  Patient not taking: Reported on 01/03/2022   Hali Marry, MD Not Taking Active   ONE TOUCH ULTRA TEST test strip [id number] Yes TEST as directed Hali Marry, MD Taking Active   solifenacin (VESICARE) 5 MG tablet [id number] Yes Take 1 tablet (5 mg total) by mouth daily. Hali Marry, MD Taking Active   tamsulosin [facility]) 0.4 MG CAPS capsule [id number] Yes Take 1 capsule (0.4 mg total) by mouth daily after supper. Hali Marry, MD Taking Active   varenicline (CHANTIX) 1 MG tablet [id number] No Take 0.5 tablets (0.5 mg total) by mouth daily for 3 days, THEN 0.5 tablets (0.5 mg total) 2 (two) times daily for 4 days, THEN 1 tablet (1 mg total) 2 (two) times daily.  Patient not taking: Reported  on 01/03/2022   Hali Marry, MD Not Taking Active             Patient Active Problem List   Diagnosis Date Noted   Tremor 93/26/7124   Alcoholic fatty liver 58/07/9832   Alcohol abuse 02/14/2021   Insomnia 03/04/2020   Pulmonary nodule, left 04/24/2019   Urinary hesitancy 05/16/2018   Nicotine dependence, uncomplicated 82/50/5397   Hydronephrosis with urinary obstruction due to ureteral calculus 01/01/2016   Benign non-nodular prostatic hyperplasia with lower urinary tract symptoms 01/01/2016   Left ureteral stone 01/01/2016   Left cervical radiculopathy 04/28/2015   Chondromalacia of right patellofemoral joint 04/28/2015   OSA on CPAP 06/26/2013   Severe episode of recurrent major depressive disorder ([place]) 04/21/2013   Generalized anxiety disorder 04/21/2013   Obesity 02/14/2012   RLS (restless legs syndrome) 03/12/2011   DERMATITIS, HANDS 08/04/2010   BELL'S PALSY 10/26/2009   CORTICAL SENILE CATARACT 05/09/2009   LUMBAR RADICULOPATHY 11/26/2008   Type 2 diabetes mellitus with periodontal complication ([place]) 67/34/1937   INCONTINENCE, URGE 03/13/2007   DEPRESSION, MAJOR, RECURRENT 08/20/2006   TOBACCO DEPENDENCE 08/20/2006   Essential hypertension, benign 08/20/2006   KIDNEY STONE - NEPHROLITHIASIS 08/20/2006   SLEEP APNEA 08/20/2006    Immunization History  Administered Date(s) Administered   Fluad Quad(high Dose 65+) 08/24/2019, 07/24/2021   H1N1 11/08/2008   Influenza Split 10/10/2011, 09/02/2012, 09/19/2020   Influenza Whole 08/19/2006, 08/25/2007, 09/16/2009, 08/04/2010   Influenza, High Dose Seasonal PF 08/12/2018   Influenza,inj,Quad PF,6+ Mos 08/12/2013, 10/07/2017   Moderna Sars-Covid-2 Vaccination 02/17/2020, 03/16/2020, 07/01/2021   Pneumococcal Conjugate-13 02/04/2018   Pneumococcal Polysaccharide-23 12/25/2007, 08/24/2019   Td 02/21/2006   Tdap 04/27/2016    Conditions to be addressed/monitored: HTN, DMII, and Tobacco Use  There are no  care plans that you recently modified to display for this patient.   Medication Assistance: None required.  Patient affirms current coverage meets needs.  Patient's preferred pharmacy is:  [id number] - [place], [place] [address] Phone: [phone] Fax: [phone]   Follow Up:  Patient agrees  to Care Plan and Follow-up.  Plan: The patient has been provided with contact information for the care management team and has been advised to call with any health related questions or concerns.   Larinda Buttery, PharmD Clinical Pharmacist [facility] [phone]

## 2022-01-09 DIAGNOSIS — Z794 Long term (current) use of insulin: Secondary | ICD-10-CM

## 2022-01-09 DIAGNOSIS — I10 Essential (primary) hypertension: Secondary | ICD-10-CM

## 2022-01-09 DIAGNOSIS — E11630 Type 2 diabetes mellitus with periodontal disease: Secondary | ICD-10-CM

## 2022-01-23 ENCOUNTER — Telehealth: Payer: Self-pay

## 2022-01-23 NOTE — Telephone Encounter (Addendum)
Initiated Prior authorization YQM:VHQIONGEX Libre 2 Reader device ? ?Via: Covermymeds ?Case/Key:[id number] ?Status: denied as of 01/23/22 ?Reason:plan exclusion ?Notified Pt via: Mychart ? ?Initiated Prior authorization [employer]:[id number] Libre 2 Sensor ?Via: Covermymeds ?Case/Key:[id number] ?Status: denied as of 01/23/22 ?Reason:plan exclusion  ?Notified Pt via: Mychart ?

## 2022-02-05 ENCOUNTER — Telehealth (HOSPITAL_COMMUNITY): Payer: Self-pay

## 2022-02-05 MED ORDER — ALPRAZOLAM 0.5 MG PO TABS: ORAL_TABLET | ORAL | 0 refills | Status: DC

## 2022-02-05 NOTE — Telephone Encounter (Signed)
Patient is requesting a refill on Xanax. Gabriel Gilbert in [place] ?

## 2022-02-15 DIAGNOSIS — R310 Gross hematuria: Secondary | ICD-10-CM | POA: Diagnosis not present

## 2022-02-15 DIAGNOSIS — N138 Other obstructive and reflux uropathy: Secondary | ICD-10-CM | POA: Diagnosis not present

## 2022-02-15 DIAGNOSIS — N401 Enlarged prostate with lower urinary tract symptoms: Secondary | ICD-10-CM | POA: Diagnosis not present

## 2022-02-22 ENCOUNTER — Other Ambulatory Visit: Payer: Self-pay | Admitting: *Deleted

## 2022-02-22 DIAGNOSIS — Z720 Tobacco use: Secondary | ICD-10-CM

## 2022-02-22 DIAGNOSIS — G4700 Insomnia, unspecified: Secondary | ICD-10-CM

## 2022-02-22 MED ORDER — TRAZODONE HCL 50 MG PO TABS: 50.0000 mg | ORAL_TABLET | Freq: Every day | ORAL | 0 refills | Status: DC

## 2022-02-22 NOTE — Telephone Encounter (Signed)
Pt called and asked if he could get something called in for sleep. He didn't have anything on his current medication list. Please see note below: ? ?03/04/2020  ?Last Assessment & Plan 06/30/2020 Office Visit Edited 06/30/2020  4:36 PM by Hali Marry, MD  ? Discussed options.  We will discontinue doxepin and switch back to trazodone which she is used previously we will start with 50 mg at bedtime.  I did not want to do the Remeron today.  He does take alprazolam about once a day on average.  Gust the importance of avoiding naps and try to go to bed the same time and completely avoiding alcohol.  I discussed that if the stress is part of the cause of the sleep disorder that treating the stress is most important to improve the sleep.  ?  ? ? ?would like a refill for sleep medication sent to [facility].  ? ?Will send to pcp for review and approval.  ?

## 2022-02-26 DIAGNOSIS — N281 Cyst of kidney, acquired: Secondary | ICD-10-CM | POA: Diagnosis not present

## 2022-02-26 DIAGNOSIS — E278 Other specified disorders of adrenal gland: Secondary | ICD-10-CM | POA: Diagnosis not present

## 2022-02-26 DIAGNOSIS — K8689 Other specified diseases of pancreas: Secondary | ICD-10-CM | POA: Diagnosis not present

## 2022-02-26 DIAGNOSIS — R310 Gross hematuria: Secondary | ICD-10-CM | POA: Diagnosis not present

## 2022-02-26 DIAGNOSIS — N200 Calculus of kidney: Secondary | ICD-10-CM | POA: Diagnosis not present

## 2022-03-08 ENCOUNTER — Telehealth (HOSPITAL_COMMUNITY): Payer: Self-pay

## 2022-03-08 MED ORDER — ALPRAZOLAM 0.5 MG PO TABS: ORAL_TABLET | ORAL | 0 refills | Status: DC

## 2022-03-08 NOTE — Telephone Encounter (Signed)
Medication mangement - Telephone call with patient, after he left a message requesting a new order for his prescribed Alprazolam.  Patient [facility], [facility] [address]  Medication last e-scribed 02/05/22 and pt returns next 05/02/22.  ?

## 2022-03-08 NOTE — Telephone Encounter (Signed)
Medication management - Telephone call with patient to inform Dr. Gilmore Laroche had sent in his requested new Alprazolam order to patient's [facility].  ?

## 2022-03-19 DIAGNOSIS — F5105 Insomnia due to other mental disorder: Secondary | ICD-10-CM | POA: Diagnosis not present

## 2022-03-19 DIAGNOSIS — Z9989 Dependence on other enabling machines and devices: Secondary | ICD-10-CM | POA: Diagnosis not present

## 2022-03-19 DIAGNOSIS — F99 Mental disorder, not otherwise specified: Secondary | ICD-10-CM | POA: Diagnosis not present

## 2022-03-19 DIAGNOSIS — G4733 Obstructive sleep apnea (adult) (pediatric): Secondary | ICD-10-CM | POA: Diagnosis not present

## 2022-03-19 DIAGNOSIS — F17210 Nicotine dependence, cigarettes, uncomplicated: Secondary | ICD-10-CM | POA: Diagnosis not present

## 2022-03-23 ENCOUNTER — Other Ambulatory Visit: Payer: Self-pay | Admitting: Family Medicine

## 2022-03-23 DIAGNOSIS — Z794 Long term (current) use of insulin: Secondary | ICD-10-CM

## 2022-03-23 DIAGNOSIS — I10 Essential (primary) hypertension: Secondary | ICD-10-CM

## 2022-03-26 ENCOUNTER — Ambulatory Visit: Payer: Medicare HMO | Admitting: Family Medicine

## 2022-04-09 ENCOUNTER — Other Ambulatory Visit (HOSPITAL_COMMUNITY): Payer: Self-pay | Admitting: Psychiatry

## 2022-04-10 ENCOUNTER — Telehealth (HOSPITAL_COMMUNITY): Payer: Self-pay

## 2022-04-10 ENCOUNTER — Encounter (HOSPITAL_COMMUNITY): Payer: Self-pay | Admitting: Psychiatry

## 2022-04-10 NOTE — Telephone Encounter (Signed)
Patient needs a letter to get out of [employer]. Please advise

## 2022-04-10 NOTE — Telephone Encounter (Signed)
Patient needs a refill on Alprazolam sent to [facility] in [place] 

## 2022-04-10 NOTE — Telephone Encounter (Signed)
Mailed per pt request.

## 2022-05-02 ENCOUNTER — Encounter (HOSPITAL_COMMUNITY): Payer: Self-pay | Admitting: Psychiatry

## 2022-05-02 ENCOUNTER — Telehealth (INDEPENDENT_AMBULATORY_CARE_PROVIDER_SITE_OTHER): Payer: Medicare HMO | Admitting: Psychiatry

## 2022-05-02 DIAGNOSIS — F331 Major depressive disorder, recurrent, moderate: Secondary | ICD-10-CM

## 2022-05-02 DIAGNOSIS — F17208 Nicotine dependence, unspecified, with other nicotine-induced disorders: Secondary | ICD-10-CM

## 2022-05-02 DIAGNOSIS — F411 Generalized anxiety disorder: Secondary | ICD-10-CM

## 2022-05-02 MED ORDER — ALPRAZOLAM 0.5 MG PO TABS: 0.5000 mg | ORAL_TABLET | Freq: Every day | ORAL | 5 refills | Status: DC | PRN

## 2022-05-02 NOTE — Progress Notes (Signed)
[facility] Outpatient Follow up visit  Telepsych Patient Identification: Gabriel Gilbert MRN:  [id number] Date of Evaluation:  05/02/2022 Referral Source: primary care.  Chief Complaint:    anxiety Visit Diagnosis:    ICD-10-CM   1. Major depressive disorder, recurrent episode, moderate (HCC)  F33.1     2. GAD (generalized anxiety disorder)  F41.1     3. Nicotine dependence with other nicotine-induced disorder, unspecified nicotine product type  F17.208       Virtual Visit via Telephone Note  I connected with Gabriel Gilbert on 05/02/22 at 10:00 AM EDT by telephone and verified that I am speaking with the correct person using two identifiers.  Location: Patient: home Provider: home office   I discussed the limitations, risks, security and privacy concerns of performing an evaluation and management service by telephone and the availability of in person appointments. I also discussed with the patient that there may be a patient responsible charge related to this service. The patient expressed understanding and agreed to proceed.      I discussed the assessment and treatment plan with the patient. The patient was provided an opportunity to ask questions and all were answered. The patient agreed with the plan and demonstrated an understanding of the instructions.   The patient was advised to call back or seek an in-person evaluation if the symptoms worsen or if the condition fails to improve as anticipated.  I provided 11 minutes of non-face-to-face time during this encounter.    HPI:  Stays at home, does not want to be on anti depressants, says not worse  Wants to continue xanax only Does not want to talk about alcohol or other concerns   Understands to abstain from alcohol and its effect   Motivation remains low   Aggravating factor: llonliness ,modifying factor: dog,  Some friends.  Timing in morning   Past Psychiatric History: depression ,anxiety  Previous Psychotropic  Medications: Yes   Substance Abuse History in the last 12 months:  No.  Consequences of Substance Abuse: NA  Past Medical History:  Past Medical History:  Diagnosis Date   Bell's palsy 12-10   Depression    Diabetes mellitus    type 2   Hyperlipidemia    Hypertension     Past Surgical History:  Procedure Laterality Date   CATARACT EXTRACTION Left    kidney stone removed     pfts     normal    Family Psychiatric History: anxiety. Mom used valium  Family History:  Family History  Problem Relation Age of Onset   COPD Mother    Hypertension Mother    Alzheimer's disease Mother        ?   Diabetes Father    COPD Father    Drug abuse Son    COPD Sister    Cancer Sister        lung    Social History:   Social History   Socioeconomic History   Marital status: Single    Spouse name: Not on file   Number of children: Not on file   Years of education: Not on file   Highest education level: Not on file  Occupational History   Not on file  Tobacco Use   Smoking status: Every Day    Packs/day: 1.00    Years: 25.00    Total pack years: 25.00    Types: Cigarettes    Last attempt to quit: 02/10/2013    Years since quitting:  9.2   Smokeless tobacco: Current    Types: Chew   Tobacco comments:    using electronic cigarettes  Vaping Use   Vaping Use: Every day  Substance and Sexual Activity   Alcohol use: No    Alcohol/week: 0.0 standard drinks of alcohol   Drug use: No   Sexual activity: Not Currently    Partners: Female  Other Topics Concern   Not on file  Social History Narrative   Not on file   Social Determinants of Health   Financial Resource Strain: Not on file  Food Insecurity: Not on file  Transportation Needs: Not on file  Physical Activity: Not on file  Stress: Not on file  Social Connections: Not on file      Allergies:  No Known Allergies  Metabolic Disorder Labs: Lab Results  Component Value Date   HGBA1C 6.6 (A) 12/26/2021   No  results found for: "PROLACTIN" Lab Results  Component Value Date   CHOL 141 12/27/2021   TRIG 247 (H) 12/27/2021   HDL 35 (L) 12/27/2021   CHOLHDL 4.0 12/27/2021   VLDL 22 01/17/2016   LDLCALC 72 12/27/2021   LDLCALC 84 10/17/2020     Current Medications: Current Outpatient Medications  Medication Sig Dispense Refill   ALPRAZolam (XANAX) 0.5 MG tablet Take 1 tablet (0.5 mg total) by mouth daily as needed. 30 tablet 5   aspirin 81 MG tablet Take 81 mg by mouth daily.     B-D UF III MINI PEN NEEDLES 31G X 5 MM MISC USE FOR INJECTION ONCE DAILY 100 each 9   busPIRone (BUSPAR) 10 MG tablet Take 1 tablet (10 mg total) by mouth 2 (two) times daily. 60 tablet 0   Continuous Blood Gluc Receiver (FREESTYLE LIBRE 2 READER) DEVI 1 Units by Does not apply route daily. 1 each PRN   Continuous Blood Gluc Sensor (FREESTYLE LIBRE 2 SENSOR) MISC 1 Units by Does not apply route every 14 (fourteen) days. 6 each 3   empagliflozin (JARDIANCE) 25 MG TABS tablet Take 1 tablet (25 mg total) by mouth daily. 90 tablet 1   FLUoxetine (PROZAC) 10 MG capsule Take 1 capsule (10 mg total) by mouth daily. Delete prior refills 30 capsule 2   glucose blood (ONE TOUCH TEST STRIPS) test strip Pt testing one time a day. 100 each 3   hydrOXYzine (VISTARIL) 25 MG capsule Take 1-2 capsules (25-50 mg total) by mouth at bedtime as needed. For sleep 45 capsule 0   insulin glargine, 1 Unit Dial, (TOUJEO SOLOSTAR) 300 UNIT/ML Solostar Pen Inject 20-35 Units into the skin at bedtime. 9 mL 1   lisinopril (ZESTRIL) 10 MG tablet TAKE ONE TABLET BY MOUTH DAILY 90 tablet 0   metFORMIN (GLUCOPHAGE) 1000 MG tablet TAKE ONE TABLET BY MOUTH TWICE A DAY WITH MEALS 180 tablet 1   Misc. Devices MISC New CPAP machine with mask and supplies through [facility].  CPAP therapy at 14 cm. water pressure.     nicotine polacrilex (SM NICOTINE POLACRILEX) 4 MG lozenge Take 1 lozenge (4 mg total) by mouth as needed for smoking cessation. (Patient  not taking: Reported on 01/03/2022) 48 tablet 1   ONE TOUCH ULTRA TEST test strip TEST as directed 100 each 3   solifenacin (VESICARE) 5 MG tablet Take 1 tablet (5 mg total) by mouth daily. 90 tablet 0   tamsulosin (FLOMAX) 0.4 MG CAPS capsule Take 1 capsule (0.4 mg total) by mouth daily after supper. 30 capsule  5   traZODone (DESYREL) 50 MG tablet Take 1 tablet (50 mg total) by mouth at bedtime. 90 tablet 0   No current facility-administered medications for this visit.      Psychiatric Specialty Exam: Review of Systems  Cardiovascular:  Negative for chest pain and palpitations.  Psychiatric/Behavioral:  Negative for suicidal ideas.     There were no vitals taken for this visit.There is no height or weight on file to calculate BMI.  General Appearance:   Eye Contact:    Speech:  Slow  Volume:  Decreased  Mood: fair  Affect:   Thought Process:  Goal Directed  Orientation:  Full (Time, Place, and Person)  Thought Content:  Rumination  Suicidal Thoughts:  No  Homicidal Thoughts:  No  Memory:  Immediate;   Fair Recent;   Fair  Judgement:  Poor  Insight:  Shallow  Psychomotor Activity:  Decreased  Concentration:  Concentration: Fair and Attention Span: Fair  Recall:  [employer] of Knowledge:Fair  Language: Fair  Akathisia:  Negative  Handed:  Right  AIMS (if indicated):  0  Assets:  Desire for Improvement  ADL's:  Intact  Cognition: WNL  Sleep:  fair    Treatment Plan Summary: Medication management and Plan as follows    Prior documentation reviewed  1. Major depression moderate recurrent: says same, not worse or suicidal. Continue supportive therapy, encourage to go out   2.GAD: gets worse without xanax, will continue, understands the risk   Understands to abstain from alcohol and its effect on depression, amotivation Fu 64m Refills sent, continue to follow with PCP for medical co morbidities.  Thresa Ross, MD 6/21/202310:05 AM

## 2022-05-08 ENCOUNTER — Telehealth (HOSPITAL_COMMUNITY): Payer: Self-pay

## 2022-05-08 NOTE — Telephone Encounter (Signed)
Pt called for a refill on Alprazolam. I informed him to reach out to the pharmacy because he has refills there.

## 2022-05-19 ENCOUNTER — Other Ambulatory Visit: Payer: Self-pay | Admitting: Family Medicine

## 2022-05-19 DIAGNOSIS — G4700 Insomnia, unspecified: Secondary | ICD-10-CM

## 2022-05-24 ENCOUNTER — Telehealth (HOSPITAL_COMMUNITY): Payer: Self-pay

## 2022-05-24 MED ORDER — DULOXETINE HCL 30 MG PO CPEP: 30.0000 mg | ORAL_CAPSULE | Freq: Every day | ORAL | 0 refills | Status: DC

## 2022-05-24 NOTE — Telephone Encounter (Signed)
Patient would like to go a antidepressant. He says he has been having crying spells. He uses [facility] in [place] Last seen 06/21 Next appt 01/16/23

## 2022-05-24 NOTE — Telephone Encounter (Signed)
Has been on SSRI before as well as effexor, will send cymbalta 30mg  , call back for concerns and earlier appointment

## 2022-05-25 NOTE — Telephone Encounter (Signed)
Tried to call and inform pt. Phone rang then hung up

## 2022-06-17 ENCOUNTER — Other Ambulatory Visit: Payer: Self-pay | Admitting: Family Medicine

## 2022-06-17 DIAGNOSIS — I10 Essential (primary) hypertension: Secondary | ICD-10-CM

## 2022-06-20 ENCOUNTER — Other Ambulatory Visit (HOSPITAL_COMMUNITY): Payer: Self-pay | Admitting: Psychiatry

## 2022-06-21 ENCOUNTER — Other Ambulatory Visit: Payer: Self-pay | Admitting: Family Medicine

## 2022-06-21 DIAGNOSIS — Z794 Long term (current) use of insulin: Secondary | ICD-10-CM

## 2022-06-28 ENCOUNTER — Encounter: Payer: Self-pay | Admitting: Family Medicine

## 2022-06-28 ENCOUNTER — Ambulatory Visit (INDEPENDENT_AMBULATORY_CARE_PROVIDER_SITE_OTHER): Payer: Medicare HMO | Admitting: Family Medicine

## 2022-06-28 VITALS — BP 126/66 | HR 67 | Ht 68.0 in | Wt 173.0 lb

## 2022-06-28 DIAGNOSIS — Z794 Long term (current) use of insulin: Secondary | ICD-10-CM | POA: Diagnosis not present

## 2022-06-28 DIAGNOSIS — F17200 Nicotine dependence, unspecified, uncomplicated: Secondary | ICD-10-CM

## 2022-06-28 DIAGNOSIS — D692 Other nonthrombocytopenic purpura: Secondary | ICD-10-CM | POA: Diagnosis not present

## 2022-06-28 DIAGNOSIS — I10 Essential (primary) hypertension: Secondary | ICD-10-CM

## 2022-06-28 DIAGNOSIS — E11630 Type 2 diabetes mellitus with periodontal disease: Secondary | ICD-10-CM

## 2022-06-28 DIAGNOSIS — K700 Alcoholic fatty liver: Secondary | ICD-10-CM | POA: Diagnosis not present

## 2022-06-28 LAB — POCT GLYCOSYLATED HEMOGLOBIN (HGB A1C): HbA1c, POC (controlled diabetic range): 6.6 % (ref 0.0–7.0)

## 2022-06-28 LAB — POCT UA - MICROALBUMIN
Albumin/Creatinine Ratio, Urine, POC: <30
Creatinine, POC: 100 mg/dL
Microalbumin Ur, POC: 10 mg/L

## 2022-06-28 MED ORDER — TRIAMCINOLONE ACETONIDE 0.1 % EX CREA: 1.0000 | TOPICAL_CREAM | Freq: Every day | CUTANEOUS | 0 refills | Status: DC | PRN

## 2022-06-28 MED ORDER — EMPAGLIFLOZIN 25 MG PO TABS: 25.0000 mg | ORAL_TABLET | Freq: Every day | ORAL | 2 refills | Status: DC

## 2022-06-28 MED ORDER — NITROFURANTOIN MONOHYD MACRO 100 MG PO CAPS: 100.0000 mg | ORAL_CAPSULE | Freq: Two times a day (BID) | ORAL | 0 refills | Status: DC

## 2022-06-28 NOTE — Assessment & Plan Note (Signed)
A1c looks fantastic today so even though he is getting some midday elevated glucose levels organ to continue current regimen of Jardiance, metformin and 10 units of Toujeo daily which seems to be working well.  We might be able to combine the Jardiance and metformin that we have had some issues lately with insurance is covering the combination.

## 2022-06-28 NOTE — Assessment & Plan Note (Signed)
Encouraged him to continue to work on cutting back on smoking he says he is trying.

## 2022-06-28 NOTE — Assessment & Plan Note (Signed)
At goal.  

## 2022-06-28 NOTE — Assessment & Plan Note (Signed)
He says he has cut out the alcohol.

## 2022-06-28 NOTE — Assessment & Plan Note (Signed)
Gave reassurance.  He also takes low-dose aspirin.

## 2022-06-28 NOTE — Progress Notes (Signed)
Established Patient Office Visit  Subjective   Patient ID: Gabriel Gilbert, male    DOB: 1951-06-21  Age: 71 y.o. MRN: [id number]  Chief Complaint  Patient presents with   Diabetes    HPI  Diabetes - no hypoglycemic events. No wounds or sores that are not healing well. No increased thirst or urination. Checking glucose at home. 150s.Taking medications as prescribed without any side effects.  Currently on Januvia, metformin and 10 units of Toujeo daily.  Eats mostly vegetarian diet.  Was not able to use the CGM with his current phone so is not actually using it.  He does occasionally do fingersticks.  Hypertension- Pt denies chest pain, SOB, dizziness, or heart palpitations.  Taking meds as directed w/o problems.  Denies medication side effects.    Tobacco abuse-he is trying to quit smoking.  He did not get a response with the nicotine replacement or with Chantix.  He also has a lot of bruising on his forearms but does not remember actually injuring them.    ROS    Objective:     BP 126/66   Pulse 67   Ht 5\' 8"  (1.727 m)   Wt 173 lb (78.5 kg)   SpO2 97%   BMI 26.30 kg/m    Physical Exam Constitutional:      Appearance: He is well-developed.  HENT:     Head: Normocephalic and atraumatic.  Cardiovascular:     Rate and Rhythm: Normal rate and regular rhythm.     Heart sounds: Normal heart sounds.  Pulmonary:     Effort: Pulmonary effort is normal.     Breath sounds: Normal breath sounds.  Skin:    General: Skin is warm and dry.  Neurological:     Mental Status: He is alert and oriented to person, place, and time.  Psychiatric:        Behavior: Behavior normal.      Results for orders placed or performed in visit on 06/28/22  POCT glycosylated hemoglobin (Hb A1C)  Result Value Ref Range   Hemoglobin A1C     HbA1c POC (<> result, manual entry)     HbA1c, POC (prediabetic range)     HbA1c, POC (controlled diabetic range) 6.6 0.0 - 7.0 %  POCT UA -  Microalbumin  Result Value Ref Range   Microalbumin Ur, POC 10 mg/L   Creatinine, POC 100 mg/dL   Albumin/Creatinine Ratio, Urine, POC <30       The 10-year ASCVD risk score (Arnett DK, et al., 2019) is: 39.7%    Assessment & Plan:   Problem List Items Addressed This Visit       Cardiovascular and Mediastinum   Senile purpura (HCC)    Gave reassurance.  He also takes low-dose aspirin.      Essential hypertension, benign    At goal.      Relevant Orders   BASIC METABOLIC PANEL WITH GFR     Digestive   Alcoholic fatty liver    He says he has cut out the alcohol.        Endocrine   Type 2 diabetes mellitus with periodontal complication (HCC) - Primary    A1c looks fantastic today so even though he is getting some midday elevated glucose levels organ to continue current regimen of Jardiance, metformin and 10 units of Toujeo daily which seems to be working well.  We might be able to combine the Jardiance and metformin that we have had some issues  lately with insurance is covering the combination.      Relevant Orders   POCT glycosylated hemoglobin (Hb A1C) (Completed)   POCT UA - Microalbumin (Completed)   BASIC METABOLIC PANEL WITH GFR     Other   TOBACCO DEPENDENCE    Encouraged him to continue to work on cutting back on smoking he says he is trying.       Return in about 4 months (around 10/28/2022) for Diabetes follow-up.    Nani Gasser, MD

## 2022-07-01 ENCOUNTER — Other Ambulatory Visit: Payer: Self-pay | Admitting: Family Medicine

## 2022-07-01 DIAGNOSIS — E11630 Type 2 diabetes mellitus with periodontal disease: Secondary | ICD-10-CM

## 2022-07-04 ENCOUNTER — Encounter: Payer: Self-pay | Admitting: General Practice

## 2022-07-18 ENCOUNTER — Other Ambulatory Visit (HOSPITAL_COMMUNITY): Payer: Self-pay | Admitting: *Deleted

## 2022-07-18 ENCOUNTER — Telehealth (HOSPITAL_COMMUNITY): Payer: Self-pay | Admitting: *Deleted

## 2022-07-18 MED ORDER — DULOXETINE HCL 30 MG PO CPEP: 30.0000 mg | ORAL_CAPSULE | Freq: Every day | ORAL | 0 refills | Status: DC

## 2022-07-18 NOTE — Telephone Encounter (Signed)
Patient called Requested Refill ---  DULoxetine (CYMBALTA) 30 MG capsule *NEXT APPT 01/16/23

## 2022-09-03 ENCOUNTER — Other Ambulatory Visit: Payer: Self-pay | Admitting: Family Medicine

## 2022-09-10 DIAGNOSIS — F419 Anxiety disorder, unspecified: Secondary | ICD-10-CM | POA: Diagnosis not present

## 2022-09-10 DIAGNOSIS — M5137 Other intervertebral disc degeneration, lumbosacral region: Secondary | ICD-10-CM | POA: Diagnosis not present

## 2022-09-10 DIAGNOSIS — R059 Cough, unspecified: Secondary | ICD-10-CM | POA: Diagnosis not present

## 2022-09-10 DIAGNOSIS — D72829 Elevated white blood cell count, unspecified: Secondary | ICD-10-CM | POA: Diagnosis not present

## 2022-09-10 DIAGNOSIS — I2510 Atherosclerotic heart disease of native coronary artery without angina pectoris: Secondary | ICD-10-CM | POA: Diagnosis not present

## 2022-09-10 DIAGNOSIS — E872 Acidosis, unspecified: Secondary | ICD-10-CM | POA: Diagnosis not present

## 2022-09-10 DIAGNOSIS — J432 Centrilobular emphysema: Secondary | ICD-10-CM | POA: Diagnosis not present

## 2022-09-10 DIAGNOSIS — M47816 Spondylosis without myelopathy or radiculopathy, lumbar region: Secondary | ICD-10-CM | POA: Diagnosis not present

## 2022-09-10 DIAGNOSIS — Z794 Long term (current) use of insulin: Secondary | ICD-10-CM | POA: Diagnosis not present

## 2022-09-10 DIAGNOSIS — I10 Essential (primary) hypertension: Secondary | ICD-10-CM | POA: Diagnosis not present

## 2022-09-10 DIAGNOSIS — I25110 Atherosclerotic heart disease of native coronary artery with unstable angina pectoris: Secondary | ICD-10-CM | POA: Diagnosis not present

## 2022-09-10 DIAGNOSIS — F17200 Nicotine dependence, unspecified, uncomplicated: Secondary | ICD-10-CM | POA: Diagnosis not present

## 2022-09-10 DIAGNOSIS — M5184 Other intervertebral disc disorders, thoracic region: Secondary | ICD-10-CM | POA: Diagnosis not present

## 2022-09-10 DIAGNOSIS — M4802 Spinal stenosis, cervical region: Secondary | ICD-10-CM | POA: Diagnosis not present

## 2022-09-10 DIAGNOSIS — M4312 Spondylolisthesis, cervical region: Secondary | ICD-10-CM | POA: Diagnosis not present

## 2022-09-10 DIAGNOSIS — M2578 Osteophyte, vertebrae: Secondary | ICD-10-CM | POA: Diagnosis not present

## 2022-09-10 DIAGNOSIS — M47814 Spondylosis without myelopathy or radiculopathy, thoracic region: Secondary | ICD-10-CM | POA: Diagnosis not present

## 2022-09-10 DIAGNOSIS — M5030 Other cervical disc degeneration, unspecified cervical region: Secondary | ICD-10-CM | POA: Diagnosis not present

## 2022-09-10 DIAGNOSIS — M47812 Spondylosis without myelopathy or radiculopathy, cervical region: Secondary | ICD-10-CM | POA: Diagnosis not present

## 2022-09-10 DIAGNOSIS — M5136 Other intervertebral disc degeneration, lumbar region: Secondary | ICD-10-CM | POA: Diagnosis not present

## 2022-09-10 DIAGNOSIS — M48061 Spinal stenosis, lumbar region without neurogenic claudication: Secondary | ICD-10-CM | POA: Diagnosis not present

## 2022-09-10 DIAGNOSIS — E119 Type 2 diabetes mellitus without complications: Secondary | ICD-10-CM | POA: Diagnosis not present

## 2022-09-10 DIAGNOSIS — I4510 Unspecified right bundle-branch block: Secondary | ICD-10-CM | POA: Diagnosis not present

## 2022-09-10 DIAGNOSIS — Z23 Encounter for immunization: Secondary | ICD-10-CM | POA: Diagnosis not present

## 2022-09-10 DIAGNOSIS — E1165 Type 2 diabetes mellitus with hyperglycemia: Secondary | ICD-10-CM | POA: Diagnosis not present

## 2022-09-10 DIAGNOSIS — E785 Hyperlipidemia, unspecified: Secondary | ICD-10-CM | POA: Diagnosis not present

## 2022-09-10 DIAGNOSIS — N400 Enlarged prostate without lower urinary tract symptoms: Secondary | ICD-10-CM | POA: Diagnosis not present

## 2022-09-10 DIAGNOSIS — I214 Non-ST elevation (NSTEMI) myocardial infarction: Secondary | ICD-10-CM | POA: Insufficient documentation

## 2022-09-10 DIAGNOSIS — R7989 Other specified abnormal findings of blood chemistry: Secondary | ICD-10-CM | POA: Diagnosis not present

## 2022-09-10 DIAGNOSIS — Z720 Tobacco use: Secondary | ICD-10-CM | POA: Diagnosis not present

## 2022-09-10 DIAGNOSIS — G4733 Obstructive sleep apnea (adult) (pediatric): Secondary | ICD-10-CM | POA: Diagnosis not present

## 2022-09-17 ENCOUNTER — Encounter: Payer: Self-pay | Admitting: *Deleted

## 2022-09-17 ENCOUNTER — Telehealth: Payer: Self-pay | Admitting: *Deleted

## 2022-09-17 NOTE — Patient Outreach (Signed)
  Care Coordination [facility] Note Transition Care Management Follow-up Telephone Call Date of discharge and from where: Friday, 09/14/22 [place]- Acute MI How have you been since you were released from the hospital? "I am doing fine; I am out and about, no problems or concerns.  I go see the heart doctor this week, at some point, can't remember the exact day but I know it is this week.  I got all the new medicine and am taking it like I am supposed to-- I can't remember the names of all of them, but I have them and am taking them.... no questions.  I live by myself but I am managing just fine and am able to do everything I need to to take care of myself.  I am following a low salt diet" Any questions or concerns? No  Items Reviewed: Did the pt receive and understand the discharge instructions provided? Yes  Medications obtained and verified? Yes  Other? No  Any new allergies since your discharge? No  Dietary orders reviewed? Yes Do you have support at home? No  reports independent; lives alone  Home Care and Equipment/Supplies: Were home health services ordered? no If so, what is the name of the agency? N/A  Has the agency set up a time to come to the patient's home? not applicable Were any new equipment or medical supplies ordered?  No What is the name of the medical supply agency? N/A Were you able to get the supplies/equipment? not applicable Do you have any questions related to the use of the equipment or supplies? No N/A  Functional Questionnaire: (I = Independent and D = Dependent) ADLs: I  Bathing/Dressing- I  Meal Prep- I  Eating- I  Maintaining continence- I  Transferring/Ambulation- I  Managing Meds- I  Follow up appointments reviewed:  PCP Hospital f/u appt confirmed? No  Scheduled to see - on - @ [facility] f/u appt confirmed? Yes  Scheduled to see Cardiology provider on "one day this week" @ "Not sure of exact time, I have it written down  at home" Are transportation arrangements needed? No  If their condition worsens, is the pt aware to call PCP or go to the Emergency Dept.? Yes Was the patient provided with contact information for the PCP's office or ED? Yes Was to pt encouraged to call back with questions or concerns? Yes  SDOH assessments and interventions completed:   Yes  Care Coordination Interventions Activated:  No   Care Coordination Interventions:  No Care Coordination interventions needed at this time.   Encounter Outcome:  Pt. Visit Completed    Oneta Rack, RN, BSN, CCRN Alumnus RN CM Care Coordination/ Transition of [facility] Management [phone]: direct office

## 2022-09-18 DIAGNOSIS — F17210 Nicotine dependence, cigarettes, uncomplicated: Secondary | ICD-10-CM | POA: Diagnosis not present

## 2022-09-18 DIAGNOSIS — Z72821 Inadequate sleep hygiene: Secondary | ICD-10-CM | POA: Diagnosis not present

## 2022-09-18 DIAGNOSIS — G4733 Obstructive sleep apnea (adult) (pediatric): Secondary | ICD-10-CM | POA: Diagnosis not present

## 2022-09-18 DIAGNOSIS — Z87891 Personal history of nicotine dependence: Secondary | ICD-10-CM | POA: Diagnosis not present

## 2022-09-18 DIAGNOSIS — F99 Mental disorder, not otherwise specified: Secondary | ICD-10-CM | POA: Diagnosis not present

## 2022-09-18 DIAGNOSIS — F5105 Insomnia due to other mental disorder: Secondary | ICD-10-CM | POA: Diagnosis not present

## 2022-09-19 DIAGNOSIS — I10 Essential (primary) hypertension: Secondary | ICD-10-CM | POA: Diagnosis not present

## 2022-09-19 DIAGNOSIS — I2510 Atherosclerotic heart disease of native coronary artery without angina pectoris: Secondary | ICD-10-CM | POA: Diagnosis not present

## 2022-09-19 DIAGNOSIS — E782 Mixed hyperlipidemia: Secondary | ICD-10-CM | POA: Diagnosis not present

## 2022-09-26 ENCOUNTER — Telehealth: Payer: Self-pay | Admitting: Family Medicine

## 2022-09-26 MED ORDER — ROSUVASTATIN CALCIUM 10 MG PO TABS: 10.0000 mg | ORAL_TABLET | Freq: Every day | ORAL | 3 refills | Status: DC

## 2022-09-26 NOTE — Telephone Encounter (Signed)
Patient aware of the recommendations and need for follow up. He verbalized understanding.

## 2022-09-26 NOTE — Telephone Encounter (Signed)
Call pt: needs to be on a statin to reduce his risk of heart diseaes with his dx of diabetes. Sent over generic Crestor to take nightly. Plan to recheck lipids and lver in 3 mo

## 2022-10-02 ENCOUNTER — Other Ambulatory Visit: Payer: Self-pay | Admitting: Family Medicine

## 2022-10-02 DIAGNOSIS — Z794 Long term (current) use of insulin: Secondary | ICD-10-CM

## 2022-10-18 ENCOUNTER — Other Ambulatory Visit (HOSPITAL_COMMUNITY): Payer: Self-pay | Admitting: Psychiatry

## 2022-10-23 ENCOUNTER — Telehealth (HOSPITAL_COMMUNITY): Payer: Self-pay

## 2022-10-23 DIAGNOSIS — R269 Unspecified abnormalities of gait and mobility: Secondary | ICD-10-CM | POA: Diagnosis not present

## 2022-10-23 DIAGNOSIS — R29898 Other symptoms and signs involving the musculoskeletal system: Secondary | ICD-10-CM | POA: Diagnosis not present

## 2022-10-23 MED ORDER — PAROXETINE HCL 10 MG PO TABS: 10.0000 mg | ORAL_TABLET | Freq: Every day | ORAL | 0 refills | Status: DC

## 2022-10-23 NOTE — Telephone Encounter (Signed)
Medication management - Telephone call with patient to follow up on a message he left requesting a call back. Pt stated he tried to get his past used Paxil refilled but was denied. Discussed with patient it had been more than a year since pt was on Paxil and questioned if patient was wanting to restart this?  Patient admitted he had been more depressed lately and would like to restart this or whatever Dr. Gilmore Laroche felt may be best. Patient last seen 05/02/22 and returns next on 01/16/23. Agreed to send the message to Dr. Gilmore Laroche with patients request and current concern for stated increased depressive symptoms.

## 2022-10-23 NOTE — Telephone Encounter (Signed)
Medication management - Telephone call back with patient to discuss Dr. Lavella Lemons approval to restart Paxil at 10 mg, one daily and questioned if patient would like to schedule an earlier appoint than the one currently scheduled in March. Patient agreed and now has an appointment set for 11/23/22 at 10:30 am.  Patient agreed to call if any problems or side effects as he restarts his Paxil and he denied any suicidal or homicidal ideations, no plan, intent or stated means to want to harm self or others at this time.  Patient agreed to follow up with a visit to a local ED or behavioral health urgent care if any worsening symptoms prior to appointment and informed Dr. Gilmore Laroche sent the new Paxil order to his [facility] in [place].

## 2022-10-24 ENCOUNTER — Telehealth: Payer: Self-pay | Admitting: Family Medicine

## 2022-10-24 NOTE — Telephone Encounter (Signed)
Per Dr. [name], patient was reminded to take Crestor nightly. Patient verbalized understanding. All questions and concerns have been addressed.

## 2022-10-24 NOTE — Telephone Encounter (Signed)
Pls call pt and remind him to pick up and take nightly his crestor.  Thank you!!!

## 2022-10-29 ENCOUNTER — Ambulatory Visit (INDEPENDENT_AMBULATORY_CARE_PROVIDER_SITE_OTHER): Payer: Medicare HMO | Admitting: Family Medicine

## 2022-10-29 ENCOUNTER — Encounter: Payer: Self-pay | Admitting: Family Medicine

## 2022-10-29 VITALS — BP 133/72 | HR 77 | Ht 68.0 in | Wt 187.0 lb

## 2022-10-29 DIAGNOSIS — Z794 Long term (current) use of insulin: Secondary | ICD-10-CM | POA: Diagnosis not present

## 2022-10-29 DIAGNOSIS — I10 Essential (primary) hypertension: Secondary | ICD-10-CM

## 2022-10-29 DIAGNOSIS — E11630 Type 2 diabetes mellitus with periodontal disease: Secondary | ICD-10-CM

## 2022-10-29 LAB — POCT GLYCOSYLATED HEMOGLOBIN (HGB A1C): Hemoglobin A1C: 8.4 % — AB (ref 4.0–5.6)

## 2022-10-29 MED ORDER — LISINOPRIL 20 MG PO TABS: 20.0000 mg | ORAL_TABLET | Freq: Every day | ORAL | 3 refills | Status: DC

## 2022-10-29 NOTE — Assessment & Plan Note (Signed)
Blood pressure little borderline today.  Will continue to monitor.

## 2022-10-29 NOTE — Progress Notes (Signed)
   Established Patient Office Visit  Subjective   Patient ID: Gabriel Gilbert, male    DOB: Apr 08, 1951  Age: 71 y.o. MRN: [id number]  Chief Complaint  Patient presents with   Diabetes    HPI  Diabetes - no hypoglycemic events. No wounds or sores that are not healing well. No increased thirst or urination. Checking glucose at home. Taking medications as prescribed without any side effects.  He reports he has been adding sugar to his sweet tea lately.  Since I last saw him he was actually admitted to [facility] on October 30 after experiencing sudden and severe weakness of his lower extremities.  He was discharged home on November 3, 4 days later after being diagnosed with a non-ST elevated MI.  Underwent cardiac catheterization and had a stent placed.  He was discharged home on metoprolol XL 50 mg daily Brilinta 90 mg twice a day.  His lisinopril was changed to 20 mg.  He did follow-up with cardiology on November 8.  Wanted let me know I checked his blood pressure at [facility] yesterday and says it was 180.    ROS    Objective:     BP 133/72   Pulse 77   Ht 5\' 8"  (1.727 m)   Wt 187 lb (84.8 kg)   SpO2 97%   BMI 28.43 kg/m    Physical Exam   Results for orders placed or performed in visit on 10/29/22  POCT glycosylated hemoglobin (Hb A1C)  Result Value Ref Range   Hemoglobin A1C 8.4 (A) 4.0 - 5.6 %   HbA1c POC (<> result, manual entry)     HbA1c, POC (prediabetic range)     HbA1c, POC (controlled diabetic range)        The ASCVD Risk score (Arnett DK, et al., 2019) failed to calculate for the following reasons:   The patient has a prior MI or stroke diagnosis    Assessment & Plan:   Problem List Items Addressed This Visit       Cardiovascular and Mediastinum   Essential hypertension, benign    Blood pressure little borderline today.  Will continue to monitor.      Relevant Medications   metoprolol succinate (TOPROL-XL) 50 MG 24 hr tablet   lisinopril  (ZESTRIL) 20 MG tablet   Other Relevant Orders   BASIC METABOLIC PANEL WITH GFR     Endocrine   Type 2 diabetes mellitus with periodontal complication (HCC) - Primary    A1c jumped up significantly to 8.4.  He says he is taking his medications but has been adding sugar to his sweet tea just really encouraged him to cut back on sugar, sweets and carb portions and continue with medication again such a big jump in 3 months" this is probably diet related also encouraged him to just make sure he is taking his medications regularly.  Follow-up in 3 months.  Lab Results  Component Value Date   HGBA1C 8.4 (A) 10/29/2022        Relevant Medications   JARDIANCE 25 MG TABS tablet   lisinopril (ZESTRIL) 20 MG tablet   Other Relevant Orders   POCT glycosylated hemoglobin (Hb A1C) (Completed)   BASIC METABOLIC PANEL WITH GFR    Return in about 3 months (around 01/28/2023) for Diabetes follow-up.    01/30/2023, MD

## 2022-10-29 NOTE — Assessment & Plan Note (Signed)
A1c jumped up significantly to 8.4.  He says he is taking his medications but has been adding sugar to his sweet tea just really encouraged him to cut back on sugar, sweets and carb portions and continue with medication again such a big jump in 3 months" this is probably diet related also encouraged him to just make sure he is taking his medications regularly.  Follow-up in 3 months.  Lab Results  Component Value Date   HGBA1C 8.4 (A) 10/29/2022

## 2022-10-29 NOTE — Patient Instructions (Signed)
Check your blood sugars twice a week fasting before your first meal of the day and write them down and bring that in with you.

## 2022-10-30 LAB — BASIC METABOLIC PANEL WITH GFR
BUN: 14 mg/dL (ref 7–25)
CO2: 29 mmol/L (ref 20–32)
Calcium: 10.3 mg/dL (ref 8.6–10.3)
Chloride: 103 mmol/L (ref 98–110)
Creat: 0.97 mg/dL (ref 0.70–1.28)
Glucose, Bld: 125 mg/dL — ABNORMAL HIGH (ref 65–99)
Potassium: 4.8 mmol/L (ref 3.5–5.3)
Sodium: 142 mmol/L (ref 135–146)
eGFR: 84 mL/min/{1.73_m2} (ref >=60)

## 2022-10-30 NOTE — Progress Notes (Signed)
Your lab work is within acceptable range and there are no concerning findings.   ?

## 2022-11-06 ENCOUNTER — Telehealth (HOSPITAL_COMMUNITY): Payer: Self-pay | Admitting: Psychiatry

## 2022-11-06 ENCOUNTER — Other Ambulatory Visit (HOSPITAL_COMMUNITY): Payer: Self-pay | Admitting: Psychiatry

## 2022-11-06 DIAGNOSIS — F411 Generalized anxiety disorder: Secondary | ICD-10-CM

## 2022-11-06 MED ORDER — ALPRAZOLAM 0.5 MG PO TABS: 0.5000 mg | ORAL_TABLET | Freq: Every day | ORAL | 0 refills | Status: DC | PRN

## 2022-11-06 NOTE — Telephone Encounter (Signed)
Patient called requesting refill of:  ALPRAZolam (XANAX) 0.5 MG tablet   [facility] [id number] - [place], [place] - [address] (Ph: [phone])   Last ordered: 05/02/2022 - 30 tablets with 5 refills  Last visit: 05/02/2022  Next visit: 11/23/2002

## 2022-11-06 NOTE — Telephone Encounter (Signed)
I have sent limited supply of Xanax to [facility].  Will route this message to Dr. Gilmore Laroche to address once back in office.

## 2022-11-06 NOTE — Telephone Encounter (Signed)
Medication refill - Telephone call with patient to inform Dr. Gilmore Laroche was out this week but that Dr. Elna Breslow, covering for him this week had sent in a new Alprazolam order for 10 days to his [facility].

## 2022-11-12 MED ORDER — ALPRAZOLAM 0.5 MG PO TABS: 0.5000 mg | ORAL_TABLET | Freq: Every day | ORAL | 0 refills | Status: DC | PRN

## 2022-11-12 NOTE — Addendum Note (Signed)
Addended by: Merian Capron on: 11/12/2022 07:23 PM   Modules accepted: Orders

## 2022-11-23 ENCOUNTER — Encounter (HOSPITAL_COMMUNITY): Payer: Self-pay | Admitting: Psychiatry

## 2022-11-23 ENCOUNTER — Telehealth (INDEPENDENT_AMBULATORY_CARE_PROVIDER_SITE_OTHER): Payer: Medicare HMO | Admitting: Psychiatry

## 2022-11-23 DIAGNOSIS — F331 Major depressive disorder, recurrent, moderate: Secondary | ICD-10-CM

## 2022-11-23 DIAGNOSIS — F17208 Nicotine dependence, unspecified, with other nicotine-induced disorders: Secondary | ICD-10-CM | POA: Diagnosis not present

## 2022-11-23 DIAGNOSIS — F411 Generalized anxiety disorder: Secondary | ICD-10-CM | POA: Diagnosis not present

## 2022-11-23 MED ORDER — PAROXETINE HCL 20 MG PO TABS: 20.0000 mg | ORAL_TABLET | Freq: Every day | ORAL | 0 refills | Status: DC

## 2022-11-23 NOTE — Progress Notes (Signed)
[facility] Outpatient Follow up visit  Telepsych Patient Identification: Gabriel Gilbert MRN:  [id number] Date of Evaluation:  11/23/2022 Referral Source: primary care.  Chief Complaint:    anxiety Visit Diagnosis:    ICD-10-CM   1. Major depressive disorder, recurrent episode, moderate (HCC)  F33.1     2. GAD (generalized anxiety disorder)  F41.1     3. Nicotine dependence with other nicotine-induced disorder, unspecified nicotine product type  F17.208      Virtual Visit via Video Note  I connected with Gabriel Gilbert on 11/23/22 at 10:30 AM EST by a video enabled telemedicine application and verified that I am speaking with the correct person using two identifiers.  Location: Patient: home Provider: home office   I discussed the limitations of evaluation and management by telemedicine and the availability of in person appointments. The patient expressed understanding and agreed to proceed.     I discussed the assessment and treatment plan with the patient. The patient was provided an opportunity to ask questions and all were answered. The patient agreed with the plan and demonstrated an understanding of the instructions.   The patient was advised to call back or seek an in-person evaluation if the symptoms worsen or if the condition fails to improve as anticipated.  I provided 15 minutes of non-face-to-face time during this encounter.       HPI:  Chart reviewed, had MI was in hospital. Managing diabetes by pcp Mood wise fair paxil somewhat helps but wants to increase med  Xanax prn for anxiety, stays mostly hom Does not elaborate much on mood or activity but says not hopeless or suicidal   Understands to abstain from alcohol and its effect   Motivation low  Aggravating factor: lonliness  ,modifying factor: dog,  Some friends.  Timing in morning   Past Psychiatric History: depression ,anxiety  Previous Psychotropic Medications: Yes   Substance Abuse History in the  last 12 months:  No.  Consequences of Substance Abuse: NA  Past Medical History:  Past Medical History:  Diagnosis Date   Bell's palsy 12-10   Depression    Diabetes mellitus    type 2   Hyperlipidemia    Hypertension     Past Surgical History:  Procedure Laterality Date   CATARACT EXTRACTION Left    kidney stone removed     pfts     normal    Family Psychiatric History: anxiety. Mom used valium  Family History:  Family History  Problem Relation Age of Onset   COPD Mother    Hypertension Mother    Alzheimer's disease Mother        ?   Diabetes Father    COPD Father    Drug abuse Son    COPD Sister    Cancer Sister        lung    Social History:   Social History   Socioeconomic History   Marital status: Single    Spouse name: Not on file   Number of children: Not on file   Years of education: Not on file   Highest education level: Not on file  Occupational History   Not on file  Tobacco Use   Smoking status: Every Day    Packs/day: 1.00    Years: 25.00    Total pack years: 25.00    Types: Cigarettes    Last attempt to quit: 02/10/2013    Years since quitting: 9.7   Smokeless tobacco: Current  Types: Chew   Tobacco comments:    using electronic cigarettes  Vaping Use   Vaping Use: Every day  Substance and Sexual Activity   Alcohol use: No    Alcohol/week: 0.0 standard drinks of alcohol   Drug use: No   Sexual activity: Not Currently    Partners: Female  Other Topics Concern   Not on file  Social History Narrative   Not on file   Social Determinants of Health   Financial Resource Strain: Not on file  Food Insecurity: No Food Insecurity (09/17/2022)   Hunger Vital Sign    Worried About Running Out of Food in the Last Year: Never true    Ran Out of Food in the Last Year: Never true  Transportation Needs: No Transportation Needs (09/17/2022)   PRAPARE - Hydrologist (Medical): No    Lack of Transportation  (Non-Medical): No  Physical Activity: Not on file  Stress: Not on file  Social Connections: Not on file      Allergies:  No Known Allergies  Metabolic Disorder Labs: Lab Results  Component Value Date   HGBA1C 8.4 (A) 10/29/2022   No results found for: "PROLACTIN" Lab Results  Component Value Date   CHOL 141 12/27/2021   TRIG 247 (H) 12/27/2021   HDL 35 (L) 12/27/2021   CHOLHDL 4.0 12/27/2021   VLDL 22 01/17/2016   LDLCALC 72 12/27/2021   LDLCALC 84 10/17/2020     Current Medications: Current Outpatient Medications  Medication Sig Dispense Refill   ALPRAZolam (XANAX) 0.5 MG tablet Take 1 tablet (0.5 mg total) by mouth daily as needed. 30 tablet 0   aspirin 81 MG tablet Take 81 mg by mouth daily.     BRILINTA 90 MG TABS tablet Take 90 mg by mouth 2 (two) times daily.     DROPLET PEN NEEDLES 31G X 5 MM MISC USE FOR INJECTION ONCE DAILY 100 each 9   glucose blood (ONE TOUCH TEST STRIPS) test strip Pt testing one time a day. 100 each 3   JARDIANCE 25 MG TABS tablet Take 25 mg by mouth daily.     lisinopril (ZESTRIL) 20 MG tablet Take 1 tablet (20 mg total) by mouth daily. 90 tablet 3   metFORMIN (GLUCOPHAGE) 1000 MG tablet TAKE 1 TABLET BY MOUTH TWICE A DAY WITH MEALS 180 tablet 1   metoprolol succinate (TOPROL-XL) 50 MG 24 hr tablet Take 50 mg by mouth daily.     Misc. Devices MISC New CPAP machine with mask and supplies through [place].  CPAP therapy at 14 cm. water pressure.     ONE TOUCH ULTRA TEST test strip TEST as directed 100 each 3   PARoxetine (PAXIL) 20 MG tablet Take 1 tablet (20 mg total) by mouth daily. 30 tablet 0   rosuvastatin (CRESTOR) 10 MG tablet Take 1 tablet (10 mg total) by mouth daily. (Patient not taking: Reported on 10/29/2022) 90 tablet 3   TOUJEO SOLOSTAR 300 UNIT/ML Solostar Pen INJECT 20-35 UNITS INTO THE SKIN AT BEDTIME (Patient not taking: Reported on 10/29/2022) 9 mL 1   No current facility-administered medications for this visit.       Psychiatric Specialty Exam: Review of Systems  Cardiovascular:  Negative for chest pain and palpitations.  Psychiatric/Behavioral:  Positive for depression. Negative for suicidal ideas.     There were no vitals taken for this visit.There is no height or weight on file to calculate BMI.  General Appearance:  casual  Eye Contact:  fair  Speech:  Slow  Volume:  Decreased  Mood: fair somewhat subdued  Affect:   Thought Process:  Goal Directed  Orientation:  Full (Time, Place, and Person)  Thought Content:  Rumination  Suicidal Thoughts:  No  Homicidal Thoughts:  No  Memory:  Immediate;   Fair Recent;   Fair  Judgement:  Poor  Insight:  Shallow  Psychomotor Activity:  Decreased  Concentration:  Concentration: Fair and Attention Span: Fair  Recall:  [employer] of Knowledge:Fair  Language: Fair  Akathisia:  Negative  Handed:  Right  AIMS (if indicated):  0  Assets:  Desire for Improvement  ADL's:  Intact  Cognition: WNL  Sleep:  fair    Treatment Plan Summary: Medication management and Plan as follows    Prior documentation reviewed  1. Major depression moderate recurrent: somewhat subdued, increase paxil to 20mg , follow up with caridology and pcp to manage health and compliance  2.GAD: worries fluctuate, continue xanax prn and increase paxil to 20mg  Fu 3 -47m.  Refills sent, continue to follow with PCP for medical co morbidities.  Merian Capron, MD 1/12/202410:38 AM

## 2022-12-04 ENCOUNTER — Telehealth (HOSPITAL_COMMUNITY): Payer: Self-pay | Admitting: *Deleted

## 2022-12-04 DIAGNOSIS — F411 Generalized anxiety disorder: Secondary | ICD-10-CM

## 2022-12-04 MED ORDER — ALPRAZOLAM 0.5 MG PO TABS: 0.5000 mg | ORAL_TABLET | Freq: Every day | ORAL | 1 refills | Status: DC | PRN

## 2022-12-04 NOTE — Telephone Encounter (Signed)
Patient called refill request -- ALPRAZolam Duanne Moron) 0.5 MG tablet  [facility] [id number] - [place], [place] - [place]   Next Appt  01/28/23 Last Appt  11/23/22

## 2022-12-04 NOTE — Addendum Note (Signed)
Addended by: Merian Capron on: 12/04/2022 03:58 PM   Modules accepted: Orders

## 2023-01-09 ENCOUNTER — Telehealth (HOSPITAL_COMMUNITY): Payer: Self-pay

## 2023-01-09 NOTE — Telephone Encounter (Signed)
Medication management - Telephone call with patient, after he left a message requesting a call back, to verify patient still should have one refill remaining at his [facility] for needed Alprazolam. Informed patient that Dr. Evalina Field order from 12/04/22 had 1 refill.  Patient to check with [facility] and to call back if any issues.

## 2023-01-16 ENCOUNTER — Telehealth (HOSPITAL_COMMUNITY): Payer: Medicare HMO | Admitting: Psychiatry

## 2023-01-28 ENCOUNTER — Ambulatory Visit: Payer: Medicare HMO | Admitting: Family Medicine

## 2023-02-05 ENCOUNTER — Ambulatory Visit (INDEPENDENT_AMBULATORY_CARE_PROVIDER_SITE_OTHER): Payer: Medicare HMO | Admitting: Family Medicine

## 2023-02-05 ENCOUNTER — Encounter: Payer: Self-pay | Admitting: Family Medicine

## 2023-02-05 VITALS — BP 111/60 | HR 84 | Ht 68.0 in | Wt 188.0 lb

## 2023-02-05 DIAGNOSIS — Z794 Long term (current) use of insulin: Secondary | ICD-10-CM

## 2023-02-05 DIAGNOSIS — E11630 Type 2 diabetes mellitus with periodontal disease: Secondary | ICD-10-CM

## 2023-02-05 DIAGNOSIS — F17200 Nicotine dependence, unspecified, uncomplicated: Secondary | ICD-10-CM

## 2023-02-05 DIAGNOSIS — I10 Essential (primary) hypertension: Secondary | ICD-10-CM | POA: Diagnosis not present

## 2023-02-05 LAB — POCT GLYCOSYLATED HEMOGLOBIN (HGB A1C): Hemoglobin A1C: 7.4 % — AB (ref 4.0–5.6)

## 2023-02-05 NOTE — Progress Notes (Signed)
   Established Patient Office Visit  Subjective   Patient ID: Gabriel Gilbert, male    DOB: 03-Jul-1951  Age: 72 y.o. MRN: [id number]  Chief Complaint  Patient presents with   Diabetes    HPI  Diabetes - no hypoglycemic events. No wounds or sores that are not healing well. No increased thirst or urination. Checking glucose at home. Taking medications as prescribed without any side effects.  He says he has started eating a little better and cut out the sweets.  He is also been walking at [place] daily.  He is only using 10 units of the Toujeo.  He wanted to know about the Brilinta.  He says he thinks he was told he likely had to take it for 1 year when he had the stent placed.  He is not any recent chest pain or shortness of breath.  He still continues to take his Brilinta but says occasionally misses a dose.  Also taking his metoprolol and Lipitor.  He went for 1 initial visit at cardiac rehab but never went back.     ROS    Objective:     BP 111/60   Pulse 84   Ht 5\' 8"  (1.727 m)   Wt 188 lb (85.3 kg)   SpO2 96%   BMI 28.59 kg/m    Physical Exam Constitutional:      Appearance: He is well-developed.  HENT:     Head: Normocephalic and atraumatic.  Cardiovascular:     Rate and Rhythm: Normal rate and regular rhythm.     Heart sounds: Normal heart sounds.  Pulmonary:     Effort: Pulmonary effort is normal.     Breath sounds: Normal breath sounds.  Skin:    General: Skin is warm and dry.  Neurological:     Mental Status: He is alert and oriented to person, place, and time.  Psychiatric:        Behavior: Behavior normal.      Results for orders placed or performed in visit on 02/05/23  POCT glycosylated hemoglobin (Hb A1C)  Result Value Ref Range   Hemoglobin A1C 7.4 (A) 4.0 - 5.6 %   HbA1c POC (<> result, manual entry)     HbA1c, POC (prediabetic range)     HbA1c, POC (controlled diabetic range)        The ASCVD Risk score (Arnett DK, et al., 2019) failed  to calculate for the following reasons:   The patient has a prior MI or stroke diagnosis    Assessment & Plan:   Problem List Items Addressed This Visit       Cardiovascular and Mediastinum   Essential hypertension, benign    .Well controlled. Continue current regimen. Follow up in  6 mo         Endocrine   Type 2 diabetes mellitus with periodontal complication ([place]) - Primary    Increase Toujeo to 15 units daily.  Continue to work on [employer] and regular exercise.      Relevant Orders   POCT glycosylated hemoglobin (Hb A1C) (Completed)   Other Visit Diagnoses     Smoker       Relevant Orders   Ambulatory Referral Lung Cancer Screening [facility]       Return in about 3 months (around 05/08/2023) for Diabetes follow-up also pls schedule him for medicare wellness exam .    Beatrice Lecher, MD

## 2023-02-05 NOTE — Assessment & Plan Note (Signed)
Well controlled. Continue current regimen. Follow up in  6 mo  

## 2023-02-05 NOTE — Assessment & Plan Note (Signed)
Increase Toujeo to 15 units daily.  Continue to work on [employer] and regular exercise.

## 2023-02-05 NOTE — Patient Instructions (Addendum)
You will need to continue your Brilinta until November and then around that time no need to follow-up with cardiology to make sure that it is okay to discontinue the medication.  Increase Toujeo to 15 units daily.  Continue to work on [employer] and regular exercise.

## 2023-02-07 ENCOUNTER — Telehealth (HOSPITAL_COMMUNITY): Payer: Self-pay | Admitting: Psychiatry

## 2023-02-07 DIAGNOSIS — F411 Generalized anxiety disorder: Secondary | ICD-10-CM

## 2023-02-07 MED ORDER — ALPRAZOLAM 0.5 MG PO TABS: 0.5000 mg | ORAL_TABLET | Freq: Every day | ORAL | 1 refills | Status: DC | PRN

## 2023-02-07 NOTE — Telephone Encounter (Signed)
Medication management - Telephone call with patient to follow up on a message he left late the previous day that he is in need of a new Alprazolam 0.5 mg order, last provided 12/04/22 + 1 refills and returns next on 03/27/23.

## 2023-02-07 NOTE — Telephone Encounter (Signed)
Patient called requesting refill of:  ALPRAZolam (XANAX) 0.5 MG tablet   [facility] [id number] - [place], [place] - [place] (Ph: [phone])   Last ordered: 12/04/2022 - 30 tablets with one refill  Last visit: 11/23/2022  Next visit: 03/27/2023

## 2023-02-07 NOTE — Telephone Encounter (Signed)
Medication management - Telephone call with patient to inform Dr. De Nurse had sent in a new Alprazolam order to his [facility] in [place] as he requested.

## 2023-02-11 ENCOUNTER — Other Ambulatory Visit: Payer: Self-pay | Admitting: Family Medicine

## 2023-02-12 IMAGING — US US LIVER ELASTOGRAPHY
1 series · 12 of 25 positions shown · non-contrast
Comparison: None.

CLINICAL DATA: Target cells and heavy alcohol use

EXAM:
US LIVER ELASTOGRAPHY
TECHNIQUE: Sonography of the liver was performed. In addition, ultrasound
elastography evaluation of the liver was performed. A region of
interest was placed within the right lobe of the liver. Following
application of a compressive sonographic pulse, tissue
compressibility was assessed. Multiple assessments were performed at
the selected site. Median tissue compressibility was determined.
Previously, hepatic stiffness was assessed by shear wave velocity.
Based on recently published Society of Radiologists in Ultrasound
consensus article, reporting is now recommended to be performed in
the SI units of pressure (kiloPascals) representing hepatic
stiffness/elasticity. The obtained result is compared to the
published reference standards. (cACLD = compensated Advanced Chronic
Liver Disease)

[Series 1: us elastography liver · 12 of 37 slices shown]
[im 2/37]
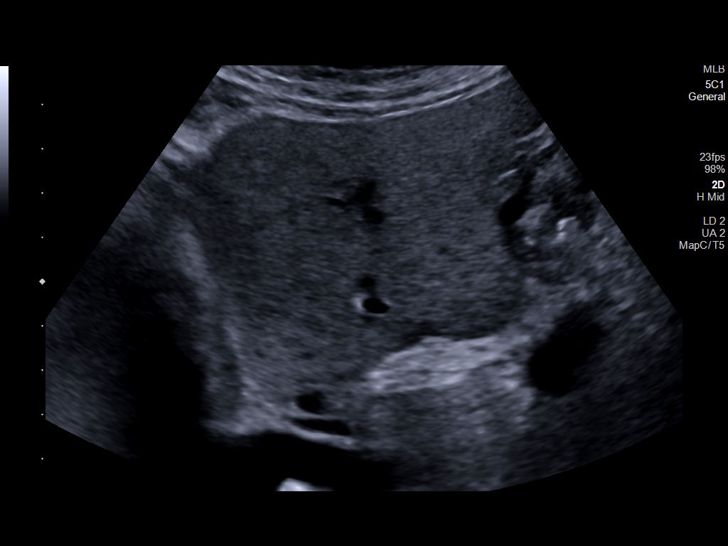
[im 5/37]
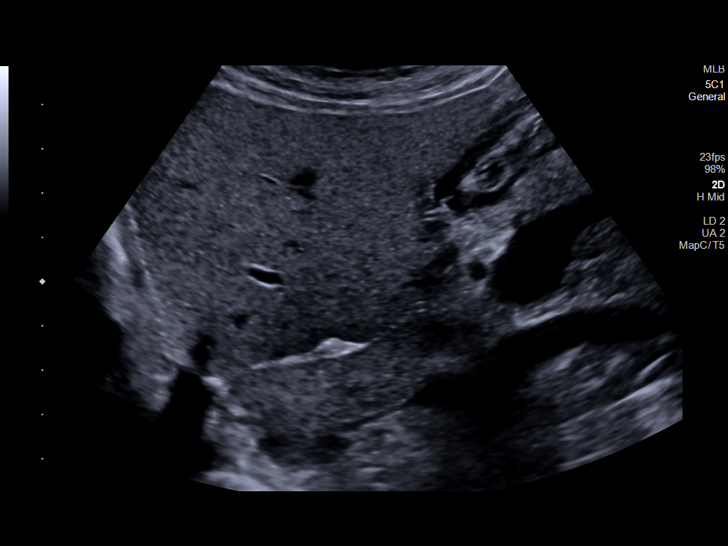
[im 8/37]
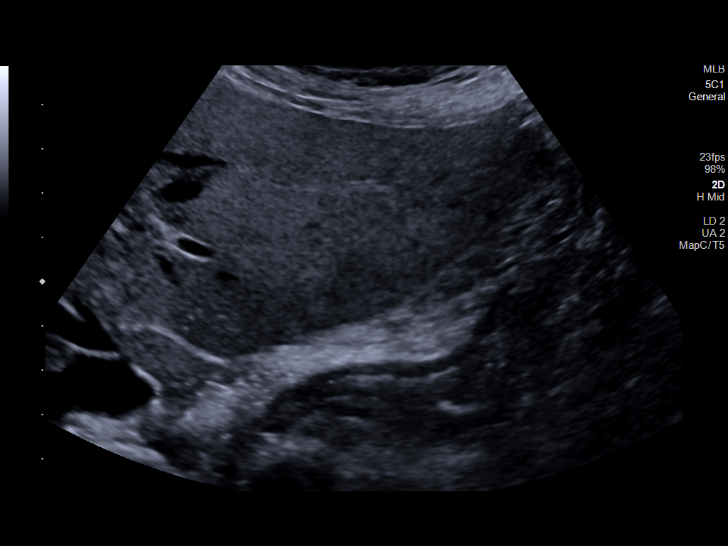
[im 11/37]
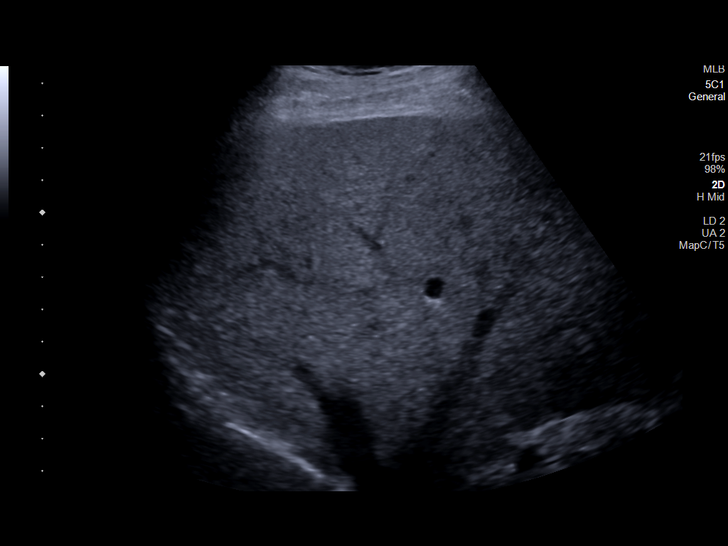
[im 14/37]
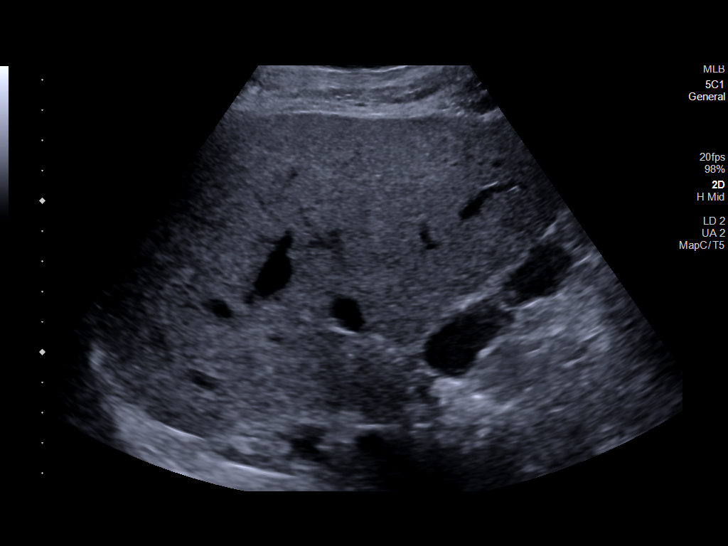
[im 17/37]
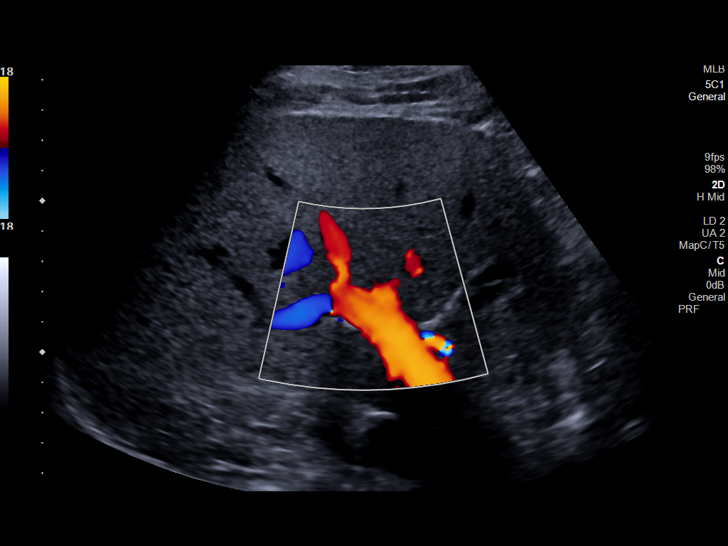
[im 20/37]
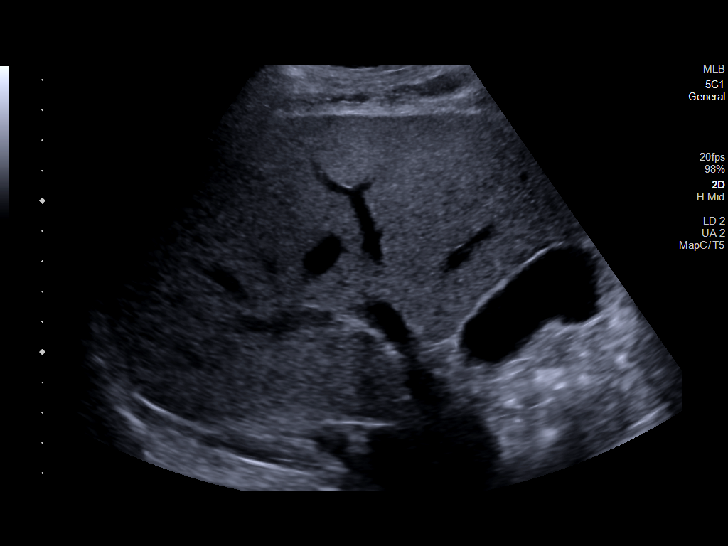
[im 23/37]
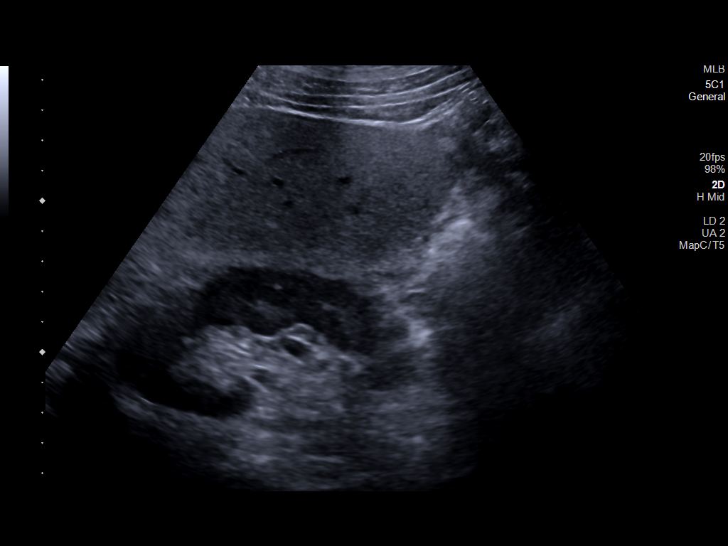
[im 26/37]
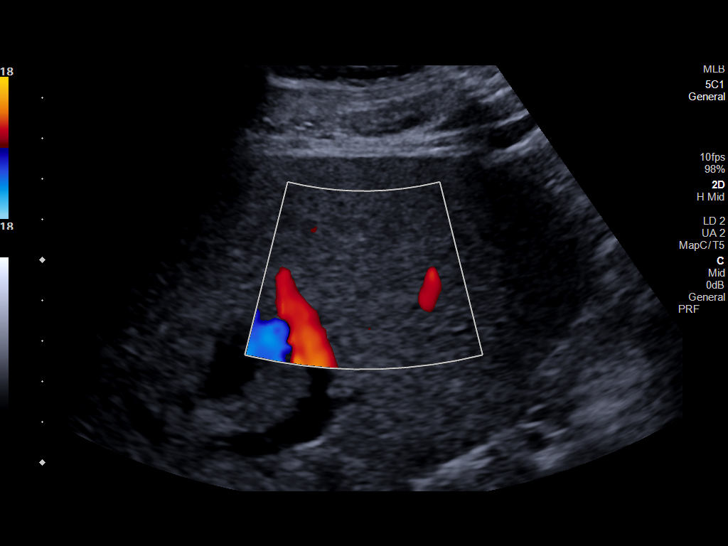
[im 29/37]
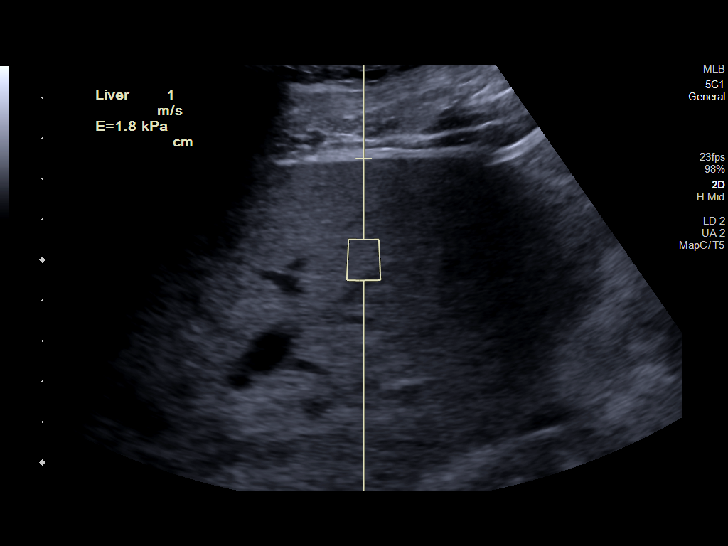
[im 32/37]
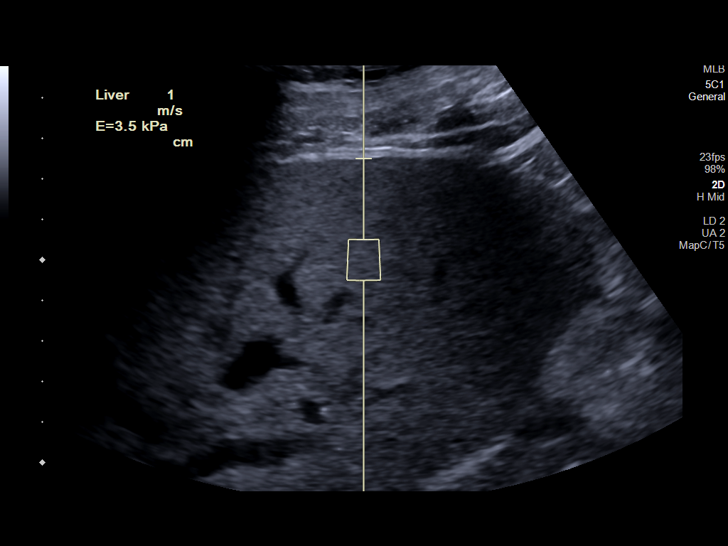
[im 35/37]
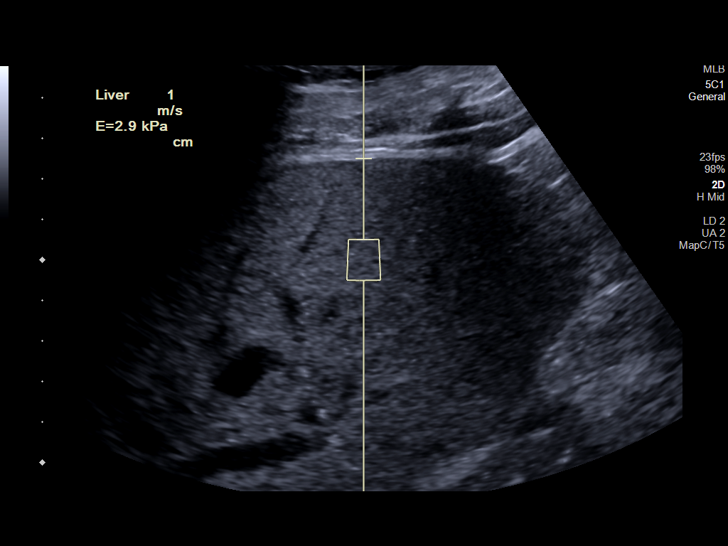

[12 of 25 positions shown; findings below may reference images not displayed]

FINDINGS: Liver: Increased echotexture compatible with fatty infiltration. No
focal abnormality or biliary ductal dilatation. Portal vein is
patent on color Doppler imaging with normal direction of blood flow
towards the liver.

ULTRASOUND HEPATIC ELASTOGRAPHY

Device: Siemens Helix VTQ

Patient position: Supine

Transducer: [id number]

Number of measurements: 10

Hepatic segment:  8

Median kPa:

IQR:

IQR/Median kPa ratio:

Data quality:  Good

Diagnostic category:  < or = 5 kPa: high probability of being normal

The use of hepatic elastography is applicable to patients with viral
hepatitis and non-alcoholic fatty liver disease. At this time, there
is insufficient data for the referenced cut-off values and use in
other causes of liver disease, including alcoholic liver disease.
Patients, however, may be assessed by elastography and serve as
their own reference standard/baseline.

In patients with non-alcoholic liver disease, the values suggesting
compensated advanced chronic liver disease (cACLD) may be lower, and
patients may need additional testing with elasticity results of [DATE]
kPa.

Please note that abnormal hepatic elasticity and shear wave
velocities may also be identified in clinical settings other than
with hepatic fibrosis, such as: acute hepatitis, elevated right
heart and central venous pressures including use of beta blockers,
Eun Sang disease ([place]), infiltrative processes such as
mastocytosis/amyloidosis/infiltrative tumor/lymphoma, extrahepatic
cholestasis, with hyperemia in the post-prandial state, and with
liver transplantation. Correlation with patient history, laboratory
data, and clinical condition recommended.

Diagnostic Categories:

< or =5 kPa: high probability of being normal

< or =9 kPa: in the absence of other known clinical signs, rules [DATE] kPa and ?13 kPa: suggestive of cACLD, but needs further testing

>13 kPa: highly suggestive of cACLD

> or =17 kPa: highly suggestive of cACLD with an increased
probability of clinically significant portal hypertension
IMPRESSION: ULTRASOUND LIVER:

Fatty infiltration of the liver.

ULTRASOUND HEPATIC ELASTOGRAPHY:

Median kPa:

Diagnostic category:  < or = 5 kPa: high probability of being normal

## 2023-02-14 ENCOUNTER — Other Ambulatory Visit: Payer: Self-pay | Admitting: Family Medicine

## 2023-02-19 ENCOUNTER — Ambulatory Visit (INDEPENDENT_AMBULATORY_CARE_PROVIDER_SITE_OTHER): Payer: Medicare HMO | Admitting: Family Medicine

## 2023-02-19 ENCOUNTER — Telehealth: Payer: Self-pay | Admitting: *Deleted

## 2023-02-19 DIAGNOSIS — Z0000 Encounter for general adult medical examination without abnormal findings: Secondary | ICD-10-CM | POA: Diagnosis not present

## 2023-02-19 NOTE — Telephone Encounter (Signed)
-----   Message from Modesto Charon, RN sent at 02/19/2023  2:27 PM EDT ----- Patient stated during his medicare wellness visit that he has been trying to get in touch with someone to discuss his blood pressure medication. When you have a moment, could you please give him a call. Thank you.

## 2023-02-19 NOTE — Progress Notes (Signed)
MEDICARE ANNUAL WELLNESS VISIT  02/19/2023  Telephone Visit Disclaimer This Medicare AWV was conducted by telephone due to national recommendations for restrictions regarding the COVID-19 Pandemic (e.g. social distancing).  I verified, using two identifiers, that I am speaking with Gabriel Gilbert or their authorized healthcare agent. I discussed the limitations, risks, security, and privacy concerns of performing an evaluation and management service by telephone and the potential availability of an in-person appointment in the future. The patient expressed understanding and agreed to proceed.  Location of Patient: home Location of Provider (nurse):  in the office  Subjective:    Gabriel Gilbert is a 72 y.o. male patient of Metheney, [name], MD who had a Medicare Annual Wellness Visit today via telephone. Gabriel Gilbert is Retired and lives alone. he has 1 child. he reports that he is socially active and does interact with friends/family regularly. he is minimally physically active and enjoys watching television.  Patient Care Team: [name], MD as PCP - General (Family Medicine) Thresa Ross, MD as Consulting Physician (Psychiatry) Gabriel Carina, [facility] as Pharmacist (Pharmacist) Gabriel Carina, [facility] (Pharmacist)     02/19/2023    2:13 PM 02/18/2020    2:04 PM 05/19/2015    3:12 PM 03/30/2014    2:11 PM  Advanced Directives  Does Patient Have a Medical Advance Directive? No No No Patient does not have advance directive;Patient would not like information  Would patient like information on creating a medical advance directive? No - Patient declined No - Patient declined No - patient declined information     Hospital Utilization Over the Past 12 Months: # of hospitalizations or ER visits: 0 # of surgeries: 1  Review of Systems    Patient reports that his overall health is unchanged compared to last year.  History obtained from chart review and the patient  Patient Reported  Readings (BP, Pulse, CBG, Weight, etc) none  Pain Assessment Pain : No/denies pain     Current Medications & Allergies (verified) Allergies as of 02/19/2023   No Known Allergies      Medication List        Accurate as of February 19, 2023  2:26 PM. If you have any questions, ask your nurse or doctor.          STOP taking these medications    DULoxetine 30 MG capsule Commonly known as: CYMBALTA       TAKE these medications    ALPRAZolam 0.5 MG tablet Commonly known as: XANAX Take 1 tablet (0.5 mg total) by mouth daily as needed.   aspirin 81 MG tablet Take 81 mg by mouth daily.   Brilinta 90 MG Tabs tablet Generic drug: ticagrelor Take 90 mg by mouth 2 (two) times daily.   Droplet Pen Needles 31G X 5 MM Misc Generic drug: Insulin Pen Needle USE FOR INJECTION ONCE DAILY   Jardiance 25 MG Tabs tablet Generic drug: empagliflozin Take 25 mg by mouth daily.   lisinopril 20 MG tablet Commonly known as: ZESTRIL Take 1 tablet (20 mg total) by mouth daily.   metFORMIN 1000 MG tablet Commonly known as: GLUCOPHAGE TAKE 1 TABLET BY MOUTH TWICE A DAY WITH MEALS   metoprolol succinate 50 MG 24 hr tablet Commonly known as: TOPROL-XL Take 50 mg by mouth daily.   Misc. Devices Misc New CPAP machine with mask and supplies through [facility].  CPAP therapy at 14 cm. water pressure.   ONE TOUCH ULTRA TEST  test strip Generic drug: glucose blood TEST as directed   glucose blood test strip Commonly known as: ONE TOUCH TEST STRIPS Pt testing one time a day.   PARoxetine 20 MG tablet Commonly known as: Paxil Take 1 tablet (20 mg total) by mouth daily.   rosuvastatin 10 MG tablet Commonly known as: Crestor Take 1 tablet (10 mg total) by mouth daily.   Toujeo SoloStar 300 UNIT/ML Solostar Pen Generic drug: insulin glargine (1 Unit Dial) INJECT 20-35 UNITS INTO THE SKIN AT BEDTIME What changed: additional instructions        History (reviewed): Past  Medical History:  Diagnosis Date   Bell's palsy 12-10   Depression    Diabetes mellitus    type 2   Hyperlipidemia    Hypertension    Past Surgical History:  Procedure Laterality Date   CATARACT EXTRACTION Left    kidney stone removed     pfts     normal   Family History  Problem Relation Age of Onset   COPD Mother    Hypertension Mother    Alzheimer's disease Mother        ?   Diabetes Father    COPD Father    Drug abuse Son    COPD Sister    Cancer Sister        lung   Social History   Socioeconomic History   Marital status: Divorced    Spouse name: Not on file   Number of children: 1   Years of education: 12   Highest education level: 12th grade  Occupational History   Occupation: Retired  Tobacco Use   Smoking status: Every Day    Packs/day: 1.00    Years: 25.00    Additional pack years: 0.00    Total pack years: 25.00    Types: Cigarettes    Last attempt to quit: 02/10/2013    Years since quitting: 10.0   Smokeless tobacco: Current    Types: Chew   Tobacco comments:    using electronic cigarettes  Vaping Use   Vaping Use: Every day  Substance and Sexual Activity   Alcohol use: No    Alcohol/week: 0.0 standard drinks of alcohol   Drug use: No   Sexual activity: Not Currently    Partners: Female  Other Topics Concern   Not on file  Social History Narrative   Lives alone. He has one child. He enjoys watching television.    Social Determinants of Health   Financial Resource Strain: Low Risk  (02/19/2023)   Overall Financial Resource Strain (CARDIA)    Difficulty of Paying Living Expenses: Not hard at all  Food Insecurity: No Food Insecurity (02/19/2023)   Hunger Vital Sign    Worried About Running Out of Food in the Last Year: Never true    Ran Out of Food in the Last Year: Never true  Transportation Needs: No Transportation Needs (02/19/2023)   PRAPARE - Administrator, Civil ServiceTransportation    Lack of Transportation (Medical): No    Lack of Transportation  (Non-Medical): No  Physical Activity: Inactive (02/19/2023)   Exercise Vital Sign    Days of Exercise per Week: 0 days    Minutes of Exercise per Session: 0 min  Stress: No Stress Concern Present (02/19/2023)   [employer] - Occupational Stress Questionnaire    Feeling of Stress : Only a little  Social Connections: Socially Isolated (02/19/2023)   Social Connection and Isolation Panel [NHANES]    Frequency  of Communication with Friends and Family: Once a week    Frequency of Social Gatherings with Friends and Family: More than three times a week    Attends Religious Services: Never    Database administrator or Organizations: No    Attends Banker Meetings: Never    Marital Status: Divorced    Activities of Daily Living    02/19/2023    2:17 PM  In your present state of health, do you have any difficulty performing the following activities:  Hearing? 0  Vision? 0  Difficulty concentrating or making decisions? 0  Walking or climbing stairs? 0  Dressing or bathing? 0  Doing errands, shopping? 0  Preparing Food and eating ? N  Using the Toilet? N  In the past six months, have you accidently leaked urine? N  Do you have problems with loss of bowel control? N  Managing your Medications? N  Managing your Finances? N  Housekeeping or managing your Housekeeping? N    Patient Education/ Literacy How often do you need to have someone help you when you read instructions, pamphlets, or other written materials from your doctor or pharmacy?: 1 - Never What is the last grade level you completed in school?: 12th grade  Exercise Current Exercise Habits: Home exercise routine, Type of exercise: walking, Time (Minutes): 10, Frequency (Times/Week): 7, Weekly Exercise (Minutes/Week): 70, Intensity: Mild, Exercise limited by: None identified  Diet Patient reports consuming 1 meals a day and 1 snack(s) a day Patient reports that his primary diet is:  Regular Patient reports that she does have regular access to food.   Depression Screen    02/19/2023    2:13 PM 02/05/2023    4:01 PM 10/29/2022   10:57 AM 12/26/2021    2:26 PM 12/12/2021    2:07 PM 08/24/2021   11:22 AM 07/24/2021    1:27 PM  PHQ 2/9 Scores  PHQ - 2 Score 2 2 6  0     PHQ- 9 Score 2 11 13       Exception Documentation     Other- indicate reason in comment box Medical reason Other- indicate reason in comment box     Fall Risk    02/19/2023    2:13 PM 02/05/2023    4:00 PM 12/26/2021    2:26 PM 12/12/2021    2:07 PM 08/24/2021   11:21 AM  Fall Risk   Falls in the past year? 0 0 0 0 0  Number falls in past yr: 0 0 0 0 0  Injury with Fall? 0 0 0 0 0  Risk for fall due to : No Fall Risks No Fall Risks No Fall Risks No Fall Risks No Fall Risks  Follow up Falls evaluation completed Falls evaluation completed Falls prevention discussed;Falls evaluation completed Falls evaluation completed Falls prevention discussed;Falls evaluation completed     Objective:  Gabriel Gilbert seemed alert and oriented and he participated appropriately during our telephone visit.  Blood Pressure Weight BMI  BP Readings from Last 3 Encounters:  02/05/23 111/60  10/29/22 133/72  06/28/22 126/66   Wt Readings from Last 3 Encounters:  02/05/23 188 lb (85.3 kg)  10/29/22 187 lb (84.8 kg)  06/28/22 173 lb (78.5 kg)   BMI Readings from Last 1 Encounters:  02/05/23 28.59 kg/m    *Unable to obtain current vital signs, weight, and BMI due to telephone visit type  Hearing/Vision  Gabriel Gilbert did not seem to have difficulty with hearing/understanding during  the telephone conversation Reports that he has not had a formal eye exam by an eye care professional within the past year Reports that he has not had a formal hearing evaluation within the past year *Unable to fully assess hearing and vision during telephone visit type  Cognitive Function:    02/19/2023    2:19 PM  6CIT Screen  What Year? 0  points  What month? 0 points  What time? 0 points  Count back from 20 0 points  Months in reverse 4 points  Repeat phrase 0 points  Total Score 4 points   (Normal:0-7, Significant for Dysfunction: >8)  Normal Cognitive Function Screening: Yes   Immunization & Health Maintenance Record Immunization History  Administered Date(s) Administered   Fluad Quad(high Dose 65+) 08/24/2019, 07/24/2021, 09/14/2022   H1N1 11/08/2008   Influenza Split 10/10/2011, 09/02/2012, 09/19/2020   Influenza Whole 08/19/2006, 08/25/2007, 09/16/2009, 08/04/2010   Influenza, High Dose Seasonal PF 08/12/2018   Influenza,inj,Quad PF,6+ Mos 08/12/2013, 10/07/2017   Moderna Sars-Covid-2 Vaccination 02/17/2020, 03/16/2020, 07/01/2021   Pneumococcal Conjugate-13 02/04/2018   Pneumococcal Polysaccharide-23 12/25/2007, 08/24/2019   Td 02/21/2006   Tdap 04/27/2016    Health Maintenance  Topic Date Due   OPHTHALMOLOGY EXAM  02/05/2024 (Originally 07/11/2012)   COLONOSCOPY (Pts 45-19yrs Insurance coverage will need to be confirmed)  02/05/2024 (Originally 04/25/2016)   Lung Cancer Screening  02/19/2024 (Originally 10/05/2002)   COVID-19 Vaccine (4 - 2023-24 season) 02/21/2024 (Originally 07/13/2022)   Zoster Vaccines- Shingrix (1 of 2) 05/07/2024 (Originally 10/06/1970)   INFLUENZA VACCINE  06/13/2023   Diabetic kidney evaluation - Urine ACR  06/29/2023   HEMOGLOBIN A1C  08/08/2023   Diabetic kidney evaluation - eGFR measurement  10/30/2023   FOOT EXAM  02/05/2024   Medicare Annual Wellness (AWV)  02/19/2024   DTaP/Tdap/Td (3 - Td or Tdap) 04/27/2026   Pneumonia Vaccine 17+ Years old  Completed   Hepatitis C Screening  Completed   HPV VACCINES  Aged Out       Assessment  This is a routine wellness examination for Gabriel Gilbert.  Health Maintenance: Due or Overdue There are no preventive care reminders to display for this patient.   Gabriel Gilbert does not need a referral for Community Assistance: Care  Management:   no Social Work:    no Prescription Assistance:  no Nutrition/Diabetes Education:  no   Plan:  Personalized Goals  Goals Addressed               This Visit's Progress     Patient Stated (pt-stated)        02/19/2023 AWV Goal: Diabetes Management  Patient will maintain an A1C level below 8.0 Patient will not develop any diabetic foot complications Patient will not experience any hypoglycemic episodes over the next 3 months Patient will notify our office of any CBG readings outside of the provider recommended range by calling [phone] Patient will adhere to provider recommendations for diabetes management  Patient Self Management Activities take all medications as prescribed and report any negative side effects monitor and record blood sugar readings as directed adhere to a low carbohydrate diet that incorporates lean proteins, vegetables, whole grains, low glycemic fruits check feet daily noting any sores, cracks, injuries, or callous formations see PCP or podiatrist if he notices any changes in his legs, feet, or toenails Patient will visit PCP and have an A1C level checked every 3 to 6 months as directed  have a yearly eye exam to monitor for  vascular changes associated with diabetes and will request that the report be sent to his pcp.  consult with his PCP regarding any changes in his health or new or worsening symptoms        Personalized Health Maintenance & Screening Recommendations  Colorectal cancer screening Shingrix vaccine Eye exam  Lung Cancer Screening Recommended: yes; referral is already placed (Low Dose CT Chest recommended if Age 64-80 years, 30 pack-year currently smoking OR have quit w/in past 15 years) Hepatitis C Screening recommended: no HIV Screening recommended: no  Advanced Directives: Written information was not prepared per patient's request.  Referrals & Orders No orders of the defined types were placed in this  encounter.   Follow-up Plan Follow-up with [name], MD as planned Schedule shingrix vaccine at the pharmacy.  Schedule eye exam.  Medicare wellness visit in one year.  Patient will access AVS on my chart.   I have personally reviewed and noted the following in the patient's chart:   Medical and social history Use of alcohol, tobacco or illicit drugs  Current medications and supplements Functional ability and status Nutritional status Physical activity Advanced directives List of other physicians Hospitalizations, surgeries, and ER visits in previous 12 months Vitals Screenings to include cognitive, depression, and falls Referrals and appointments  In addition, I have reviewed and discussed with Gabriel Gilbert certain preventive protocols, quality metrics, and best practice recommendations. A written personalized care plan for preventive services as well as general preventive health recommendations is available and can be mailed to the patient at his request.      Modesto Charon  02/19/2023

## 2023-02-19 NOTE — Patient Instructions (Signed)
MEDICARE ANNUAL WELLNESS VISIT Health Maintenance Summary and Written Plan of Care  Gabriel Gilbert ,  Thank you for allowing me to perform your Medicare Annual Wellness Visit and for your ongoing commitment to your health.   Health Maintenance & Immunization History Health Maintenance  Topic Date Due   Lung Cancer Screening  Never done   OPHTHALMOLOGY EXAM  02/05/2024 (Originally 07/11/2012)   COLONOSCOPY (Pts 45-35yrs Insurance coverage will need to be confirmed)  02/05/2024 (Originally 04/25/2016)   COVID-19 Vaccine (4 - 2023-24 season) 02/21/2024 (Originally 07/13/2022)   Zoster Vaccines- Shingrix (1 of 2) 05/07/2024 (Originally 10/06/1970)   INFLUENZA VACCINE  06/13/2023   Diabetic kidney evaluation - Urine ACR  06/29/2023   HEMOGLOBIN A1C  08/08/2023   Diabetic kidney evaluation - eGFR measurement  10/30/2023   FOOT EXAM  02/05/2024   Medicare Annual Wellness (AWV)  02/19/2024   DTaP/Tdap/Td (3 - Td or Tdap) 04/27/2026   Pneumonia Vaccine 63+ Years old  Completed   Hepatitis C Screening  Completed   HPV VACCINES  Aged Out   Immunization History  Administered Date(s) Administered   Fluad Quad(high Dose 65+) 08/24/2019, 07/24/2021, 09/14/2022   H1N1 11/08/2008   Influenza Split 10/10/2011, 09/02/2012, 09/19/2020   Influenza Whole 08/19/2006, 08/25/2007, 09/16/2009, 08/04/2010   Influenza, High Dose Seasonal PF 08/12/2018   Influenza,inj,Quad PF,6+ Mos 08/12/2013, 10/07/2017   Moderna Sars-Covid-2 Vaccination 02/17/2020, 03/16/2020, 07/01/2021   Pneumococcal Conjugate-13 02/04/2018   Pneumococcal Polysaccharide-23 12/25/2007, 08/24/2019   Td 02/21/2006   Tdap 04/27/2016    These are the patient goals that we discussed:  Goals Addressed               This Visit's Progress     Patient Stated (pt-stated)        02/19/2023 AWV Goal: Diabetes Management  Patient will maintain an A1C level below 8.0 Patient will not develop any diabetic foot complications Patient will not  experience any hypoglycemic episodes over the next 3 months Patient will notify our office of any CBG readings outside of the provider recommended range by calling [phone] Patient will adhere to provider recommendations for diabetes management  Patient Self Management Activities take all medications as prescribed and report any negative side effects monitor and record blood sugar readings as directed adhere to a low carbohydrate diet that incorporates lean proteins, vegetables, whole grains, low glycemic fruits check feet daily noting any sores, cracks, injuries, or callous formations see PCP or podiatrist if he notices any changes in his legs, feet, or toenails Patient will visit PCP and have an A1C level checked every 3 to 6 months as directed  have a yearly eye exam to monitor for vascular changes associated with diabetes and will request that the report be sent to his pcp.  consult with his PCP regarding any changes in his health or new or worsening symptoms          This is a list of Health Maintenance Items that are overdue or due now: Health Maintenance Due  Topic Date Due   Lung Cancer Screening  Never done   Colorectal cancer screening Shingrix vaccine Eye exam  Orders/Referrals Placed Today: No orders of the defined types were placed in this encounter.  (Contact our referral department at [phone] if you have not spoken with someone about your referral appointment within the next 5 days)    Follow-up Plan Follow-up with [name], MD as planned Schedule shingrix vaccine at the pharmacy.  Schedule eye exam.  Medicare wellness visit in one year.  Patient will access AVS on my chart.      Health Maintenance, Male Adopting a healthy lifestyle and getting preventive care are important in promoting health and wellness. Ask your health care provider about: The right schedule for you to have regular tests and exams. Things you can do on your own to  prevent diseases and keep yourself healthy. What should I know about diet, weight, and exercise? Eat a healthy diet  Eat a diet that includes plenty of vegetables, fruits, low-fat dairy products, and lean protein. Do not eat a lot of foods that are high in solid fats, added sugars, or sodium. Maintain a healthy weight Body mass index (BMI) is a measurement that can be used to identify possible weight problems. It estimates body fat based on height and weight. Your health care provider can help determine your BMI and help you achieve or maintain a healthy weight. Get regular exercise Get regular exercise. This is one of the most important things you can do for your health. Most adults should: Exercise for at least 150 minutes each week. The exercise should increase your heart rate and make you sweat (moderate-intensity exercise). Do strengthening exercises at least twice a week. This is in addition to the moderate-intensity exercise. Spend less time sitting. Even light physical activity can be beneficial. Watch cholesterol and blood lipids Have your blood tested for lipids and cholesterol at 71 years of age, then have this test every 5 years. You may need to have your cholesterol levels checked more often if: Your lipid or cholesterol levels are high. You are older than 71 years of age. You are at high risk for heart disease. What should I know about cancer screening? Many types of cancers can be detected early and may often be prevented. Depending on your health history and family history, you may need to have cancer screening at various ages. This may include screening for: Colorectal cancer. Prostate cancer. Skin cancer. Lung cancer. What should I know about heart disease, diabetes, and high blood pressure? Blood pressure and heart disease High blood pressure causes heart disease and increases the risk of stroke. This is more likely to develop in people who have high blood pressure  readings or are overweight. Talk with your health care provider about your target blood pressure readings. Have your blood pressure checked: Every 3-5 years if you are 5018-71 years of age. Every year if you are 71 years old or older. If you are between the ages of 4465 and 5075 and are a current or former smoker, ask your health care provider if you should have a one-time screening for abdominal aortic aneurysm (AAA). Diabetes Have regular diabetes screenings. This checks your fasting blood sugar level. Have the screening done: Once every three years after age 71 if you are at a normal weight and have a low risk for diabetes. More often and at a younger age if you are overweight or have a high risk for diabetes. What should I know about preventing infection? Hepatitis B If you have a higher risk for hepatitis B, you should be screened for this virus. Talk with your health care provider to find out if you are at risk for hepatitis B infection. Hepatitis C Blood testing is recommended for: Everyone born from [birth year] through 1965. Anyone with known risk factors for hepatitis C. Sexually transmitted infections (STIs) You should be screened each year for STIs, including gonorrhea and chlamydia, if: You are  sexually active and are younger than 71 years of age. You are older than 71 years of age and your health care provider tells you that you are at risk for this type of infection. Your sexual activity has changed since you were last screened, and you are at increased risk for chlamydia or gonorrhea. Ask your health care provider if you are at risk. Ask your health care provider about whether you are at high risk for HIV. Your health care provider may recommend a prescription medicine to help prevent HIV infection. If you choose to take medicine to prevent HIV, you should first get tested for HIV. You should then be tested every 3 months for as long as you are taking the medicine. Follow these instructions  at home: Alcohol use Do not drink alcohol if your health care provider tells you not to drink. If you drink alcohol: Limit how much you have to 0-2 drinks a day. Know how much alcohol is in your drink. In the [place] one drink equals one 12 oz bottle of beer (355 mL), one 5 oz glass of wine (148 mL), or one 1 oz glass of hard liquor (44 mL). Lifestyle Do not use any products that contain nicotine or tobacco. These products include cigarettes, chewing tobacco, and vaping devices, such as e-cigarettes. If you need help quitting, ask your health care provider. Do not use street drugs. Do not share needles. Ask your health care provider for help if you need support or information about quitting drugs. General instructions Schedule regular health, dental, and eye exams. Stay current with your vaccines. Tell your health care provider if: You often feel depressed. You have ever been abused or do not feel safe at home. Summary Adopting a healthy lifestyle and getting preventive care are important in promoting health and wellness. Follow your health care provider's instructions about healthy diet, exercising, and getting tested or screened for diseases. Follow your health care provider's instructions on monitoring your cholesterol and blood pressure. This information is not intended to replace advice given to you by your health care provider. Make sure you discuss any questions you have with your health care provider. Document Revised: 03/20/2021 Document Reviewed: 03/20/2021 Elsevier Patient Education  2023 [employer].

## 2023-02-19 NOTE — Telephone Encounter (Signed)
Called pt and lvm asking that he return call. He had spoken to [place] K. Today and asked her to have me call him.

## 2023-02-21 ENCOUNTER — Other Ambulatory Visit: Payer: Self-pay

## 2023-02-21 MED ORDER — METOPROLOL SUCCINATE ER 50 MG PO TB24: 50.0000 mg | ORAL_TABLET | Freq: Every day | ORAL | 1 refills | Status: DC

## 2023-02-21 NOTE — Telephone Encounter (Signed)
Patient will need refill sent to pharmacy . Listed as historical provider in patient chart.

## 2023-02-21 NOTE — Telephone Encounter (Signed)
Attempted call to patient. Left voice mail message requesting a return call.  

## 2023-02-21 NOTE — Telephone Encounter (Signed)
Attempted to call patient. Left  a voice mail requesting a return call.

## 2023-02-21 NOTE — Telephone Encounter (Signed)
Yes, he needs to be taken the metoprolol since he had a heart attack in the fall.  That medication helps reduce the risk of him having another heart attack.  If his blood pressures are little on the low end he can always take half a tab daily.  He does need to be taking it.

## 2023-02-21 NOTE — Telephone Encounter (Signed)
Spoke with patient .  He was calling to inquire whether he is suppose to be on Metoprolol succinate 50mg . He states he thinks this was prescribed for him while in the hospital in November but he does not think that he ever started it. Requesting a call back regarding this and if he is to be taking it he will need a refill sent to his pharmacy as he does not have any of the medication.

## 2023-02-26 NOTE — Telephone Encounter (Signed)
Attempted call to patient again. Left voice mail message to return our call.

## 2023-02-27 NOTE — Telephone Encounter (Signed)
Letter placed in mail to patient.

## 2023-03-21 DIAGNOSIS — H524 Presbyopia: Secondary | ICD-10-CM | POA: Diagnosis not present

## 2023-03-21 DIAGNOSIS — Z961 Presence of intraocular lens: Secondary | ICD-10-CM | POA: Diagnosis not present

## 2023-03-21 DIAGNOSIS — H26493 Other secondary cataract, bilateral: Secondary | ICD-10-CM | POA: Diagnosis not present

## 2023-03-21 DIAGNOSIS — E119 Type 2 diabetes mellitus without complications: Secondary | ICD-10-CM | POA: Diagnosis not present

## 2023-03-22 ENCOUNTER — Telehealth: Payer: Self-pay | Admitting: Family Medicine

## 2023-03-22 NOTE — Telephone Encounter (Signed)
Pls call pt: would he be willing to do cologuar for colon cancer screening?   Can we get him on the next eye exam visit via [facility]?

## 2023-03-27 ENCOUNTER — Encounter (HOSPITAL_COMMUNITY): Payer: Self-pay

## 2023-03-27 ENCOUNTER — Telehealth (HOSPITAL_COMMUNITY): Payer: Medicare HMO | Admitting: Psychiatry

## 2023-03-27 NOTE — Telephone Encounter (Signed)
Unable to reach patient. Mailbox is full.

## 2023-03-28 ENCOUNTER — Telehealth: Payer: Self-pay | Admitting: Family Medicine

## 2023-03-28 NOTE — Telephone Encounter (Signed)
Attemtped call to patient. Mail box is full. Could not leave a voice mail message.

## 2023-03-29 NOTE — Telephone Encounter (Signed)
See prior note

## 2023-04-05 ENCOUNTER — Telehealth (HOSPITAL_COMMUNITY): Payer: Self-pay | Admitting: Psychiatry

## 2023-04-05 DIAGNOSIS — F411 Generalized anxiety disorder: Secondary | ICD-10-CM

## 2023-04-05 MED ORDER — ALPRAZOLAM 0.5 MG PO TABS: 0.5000 mg | ORAL_TABLET | Freq: Every day | ORAL | 1 refills | Status: DC | PRN

## 2023-04-05 NOTE — Telephone Encounter (Signed)
Patient called requesting refill of: ALPRAZolam (XANAX) 0.5 MG tablet   [facility] [id number] - [place], [place] - [address] (Ph: [phone])   Last ordered: 02/07/2023 - 30 tablets with 1 refill  Last visit: 01/16/2023  Next visit: 04/24/2023

## 2023-04-15 ENCOUNTER — Other Ambulatory Visit: Payer: Self-pay | Admitting: Family Medicine

## 2023-04-15 DIAGNOSIS — Z794 Long term (current) use of insulin: Secondary | ICD-10-CM

## 2023-04-17 ENCOUNTER — Telehealth: Payer: Self-pay | Admitting: Family Medicine

## 2023-04-17 DIAGNOSIS — N401 Enlarged prostate with lower urinary tract symptoms: Secondary | ICD-10-CM

## 2023-04-17 MED ORDER — TAMSULOSIN HCL 0.4 MG PO CAPS
0.4000 mg | ORAL_CAPSULE | Freq: Every day | ORAL | 3 refills | Status: DC
Start: 2023-04-17 — End: 2024-04-30

## 2023-04-17 NOTE — Telephone Encounter (Signed)
Meds ordered this encounter  Medications   tamsulosin (FLOMAX) 0.4 MG CAPS capsule    Sig: Take 1 capsule (0.4 mg total) by mouth daily.    Dispense:  30 capsule    Refill:  3

## 2023-04-17 NOTE — Telephone Encounter (Signed)
Patient is requesting Flomax for his prostate he hasn't taken in a while Please call patient at [phone]

## 2023-04-18 NOTE — Telephone Encounter (Signed)
Patient declines both cologard and eye exam .

## 2023-04-22 ENCOUNTER — Other Ambulatory Visit (HOSPITAL_COMMUNITY): Payer: Self-pay | Admitting: Psychiatry

## 2023-04-24 ENCOUNTER — Encounter (HOSPITAL_COMMUNITY): Payer: Self-pay | Admitting: Psychiatry

## 2023-04-24 ENCOUNTER — Telehealth (INDEPENDENT_AMBULATORY_CARE_PROVIDER_SITE_OTHER): Payer: Medicare HMO | Admitting: Psychiatry

## 2023-04-24 DIAGNOSIS — F411 Generalized anxiety disorder: Secondary | ICD-10-CM

## 2023-04-24 DIAGNOSIS — Z789 Other specified health status: Secondary | ICD-10-CM

## 2023-04-24 DIAGNOSIS — F331 Major depressive disorder, recurrent, moderate: Secondary | ICD-10-CM

## 2023-04-24 MED ORDER — ALPRAZOLAM 0.5 MG PO TABS: 0.5000 mg | ORAL_TABLET | Freq: Every day | ORAL | 1 refills | Status: DC | PRN

## 2023-04-24 NOTE — Progress Notes (Signed)
[facility] Outpatient Follow up visit  Telepsych Patient Identification: Gabriel Gilbert MRN:  [id number] Date of Evaluation:  04/24/2023 Referral Source: primary care.  Chief Complaint:    anxiety Visit Diagnosis:    ICD-10-CM   1. Major depressive disorder, recurrent episode, moderate (HCC)  F33.1     2. GAD (generalized anxiety disorder)  F41.1 ALPRAZolam (XANAX) 0.5 MG tablet    3. Alcohol use  Z78.9     Virtual Visit via Video Note  I connected with Gabriel Gilbert on 04/24/23 at  8:00 AM EDT by a video enabled telemedicine application and verified that I am speaking with the correct person using two identifiers.  Location: Patient: home Provider: home office   I discussed the limitations of evaluation and management by telemedicine and the availability of in person appointments. The patient expressed understanding and agreed to proceed.    I discussed the assessment and treatment plan with the patient. The patient was provided an opportunity to ask questions and all were answered. The patient agreed with the plan and demonstrated an understanding of the instructions.   The patient was advised to call back or seek an in-person evaluation if the symptoms worsen or if the condition fails to improve as anticipated.  I provided 20 minutes of non-face-to-face time during this encounter.       HPI:  Chart reviewed, follows with pcp Paixl helps some with depression Anxiety still concerning at times, takes xanax sometime more then a day  Understands the risk of dependence, memory concerns and to keep one a day  Goes to [facility] [employer] and outside Denies drinking recently   Understands to abstain from alcohol and its effect   Motivation low or baseline  Aggravating factor: lonliness  ,modifying factor: dog,  Some friends.  Timing in morning   Past Psychiatric History: depression ,anxiety  Previous Psychotropic Medications: Yes   Substance Abuse History in the last 12 months:   No.  Consequences of Substance Abuse: NA  Past Medical History:  Past Medical History:  Diagnosis Date   Bell's palsy 12-10   Depression    Diabetes mellitus    type 2   Hyperlipidemia    Hypertension     Past Surgical History:  Procedure Laterality Date   CATARACT EXTRACTION Left    kidney stone removed     pfts     normal    Family Psychiatric History: anxiety. Mom used valium  Family History:  Family History  Problem Relation Age of Onset   COPD Mother    Hypertension Mother    Alzheimer's disease Mother        ?   Diabetes Father    COPD Father    Drug abuse Son    COPD Sister    Cancer Sister        lung    Social History:   Social History   Socioeconomic History   Marital status: Divorced    Spouse name: Not on file   Number of children: 1   Years of education: 12   Highest education level: 12th grade  Occupational History   Occupation: Retired  Tobacco Use   Smoking status: Every Day    Packs/day: 1.00    Years: 25.00    Additional pack years: 0.00    Total pack years: 25.00    Types: Cigarettes    Last attempt to quit: 02/10/2013    Years since quitting: 10.2   Smokeless tobacco: Current  Types: Chew   Tobacco comments:    using electronic cigarettes  Vaping Use   Vaping Use: Every day  Substance and Sexual Activity   Alcohol use: No    Alcohol/week: 0.0 standard drinks of alcohol   Drug use: No   Sexual activity: Not Currently    Partners: Female  Other Topics Concern   Not on file  Social History Narrative   Lives alone. He has one child. He enjoys watching television.    Social Determinants of Health   Financial Resource Strain: Low Risk  (02/19/2023)   Overall Financial Resource Strain (CARDIA)    Difficulty of Paying Living Expenses: Not hard at all  Food Insecurity: No Food Insecurity (02/19/2023)   Hunger Vital Sign    Worried About Running Out of Food in the Last Year: Never true    Ran Out of Food in the Last Year:  Never true  Transportation Needs: No Transportation Needs (02/19/2023)   PRAPARE - Administrator, Civil Service (Medical): No    Lack of Transportation (Non-Medical): No  Physical Activity: Inactive (02/19/2023)   Exercise Vital Sign    Days of Exercise per Week: 0 days    Minutes of Exercise per Session: 0 min  Stress: No Stress Concern Present (02/19/2023)   [employer] of Occupational Health - Occupational Stress Questionnaire    Feeling of Stress : Only a little  Social Connections: Socially Isolated (02/19/2023)   Social Connection and Isolation Panel [NHANES]    Frequency of Communication with Friends and Family: Once a week    Frequency of Social Gatherings with Friends and Family: More than three times a week    Attends Religious Services: Never    Database administrator or Organizations: No    Attends Banker Meetings: Never    Marital Status: Divorced      Allergies:  No Known Allergies  Metabolic Disorder Labs: Lab Results  Component Value Date   HGBA1C 7.4 (A) 02/05/2023   No results found for: "PROLACTIN" Lab Results  Component Value Date   CHOL 141 12/27/2021   TRIG 247 (H) 12/27/2021   HDL 35 (L) 12/27/2021   CHOLHDL 4.0 12/27/2021   VLDL 22 01/17/2016   LDLCALC 72 12/27/2021   LDLCALC 84 10/17/2020     Current Medications: Current Outpatient Medications  Medication Sig Dispense Refill   ALPRAZolam (XANAX) 0.5 MG tablet Take 1 tablet (0.5 mg total) by mouth daily as needed. 30 tablet 1   aspirin 81 MG tablet Take 81 mg by mouth daily.     BRILINTA 90 MG TABS tablet Take 90 mg by mouth 2 (two) times daily.     DROPLET PEN NEEDLES 31G X 5 MM MISC USE FOR INJECTION ONCE DAILY 100 each 9   glucose blood (ONE TOUCH TEST STRIPS) test strip Pt testing one time a day. 100 each 3   JARDIANCE 25 MG TABS tablet Take 25 mg by mouth daily.     lisinopril (ZESTRIL) 20 MG tablet Take 1 tablet (20 mg total) by mouth daily. 90 tablet 3    metFORMIN (GLUCOPHAGE) 1000 MG tablet TAKE 1 TABLET BY MOUTH TWICE A DAY WITH A MEAL 180 tablet 1   metoprolol succinate (TOPROL-XL) 50 MG 24 hr tablet Take 1 tablet (50 mg total) by mouth daily. 90 tablet 1   Misc. Devices MISC New CPAP machine with mask and supplies through [facility].  CPAP therapy at 14 cm. water  pressure.     ONE TOUCH ULTRA TEST test strip TEST as directed 100 each 3   PARoxetine (PAXIL) 20 MG tablet TAKE 1 TABLET BY MOUTH DAILY 30 tablet 0   rosuvastatin (CRESTOR) 10 MG tablet Take 1 tablet (10 mg total) by mouth daily. 90 tablet 3   tamsulosin (FLOMAX) 0.4 MG CAPS capsule Take 1 capsule (0.4 mg total) by mouth daily. 30 capsule 3   TOUJEO SOLOSTAR 300 UNIT/ML Solostar Pen INJECT 20-35 UNITS INTO THE SKIN AT BEDTIME (Patient taking differently: Inject 20-35 Units into the skin at bedtime. 10 U) 9 mL 1   No current facility-administered medications for this visit.      Psychiatric Specialty Exam: Review of Systems  Cardiovascular:  Negative for chest pain and palpitations.  Psychiatric/Behavioral:  Positive for depression. Negative for suicidal ideas.     There were no vitals taken for this visit.There is no height or weight on file to calculate BMI.  General Appearance: casual  Eye Contact:  fair  Speech:  Slow  Volume:  Decreased  Mood: somewhat subdued  Affect:   Thought Process:  Goal Directed  Orientation:  Full (Time, Place, and Person)  Thought Content:  Rumination  Suicidal Thoughts:  No  Homicidal Thoughts:  No  Memory:  Immediate;   Fair Recent;   Fair  Judgement:  Poor  Insight:  Shallow  Psychomotor Activity:  Decreased  Concentration:  Concentration: Fair and Attention Span: Fair  Recall:  [employer] of Knowledge:Fair  Language: Fair  Akathisia:  Negative  Handed:  Right  AIMS (if indicated):  0  Assets:  Desire for Improvement  ADL's:  Intact  Cognition: WNL  Sleep:  fair    Treatment Plan Summary: Medication management  and Plan as follows    Prior documentation reviewed  1. Major depression moderate recurrent: somewhat subdued, continue paxil   2.GAD: worries fluctuate, continue xanax prn or daily one. Some nausea with paxil , would not increase dose or follow up with pcp for eval 3. Alcohol use: discussed abstinence, says not drinking  Fu 60m.   Thresa Ross, MD 6/12/20248:14 AM

## 2023-05-09 ENCOUNTER — Ambulatory Visit: Payer: Medicare HMO | Admitting: Family Medicine

## 2023-05-09 DIAGNOSIS — Z794 Long term (current) use of insulin: Secondary | ICD-10-CM

## 2023-06-05 ENCOUNTER — Telehealth: Payer: Self-pay | Admitting: Family Medicine

## 2023-06-05 DIAGNOSIS — F411 Generalized anxiety disorder: Secondary | ICD-10-CM

## 2023-06-06 ENCOUNTER — Telehealth (HOSPITAL_COMMUNITY): Payer: Self-pay | Admitting: *Deleted

## 2023-06-06 MED ORDER — ALPRAZOLAM 0.5 MG PO TABS: 0.5000 mg | ORAL_TABLET | Freq: Every day | ORAL | 1 refills | Status: DC | PRN

## 2023-06-06 NOTE — Telephone Encounter (Signed)
06/05/23 PATIENT LVM REQUESTING CALL BACK--  06/06/23 CALLED RETURNED NO ANSWER LVM

## 2023-06-06 NOTE — Addendum Note (Signed)
Addended by: Thresa Ross on: 06/06/2023 09:47 AM   Modules accepted: Orders

## 2023-06-06 NOTE — Telephone Encounter (Signed)
PATIENT REFILL REQUEST [facility] [id number] - [place], [place] - [address]  ALPRAZolam (XANAX) 0.5 MG tablet [[id number]]   Order Details Dose: 0.5 mg Route: Oral Frequency: Daily PRN  Dispense Quantity: 30 tablet Refills: 1    LAST APPT 04/24/23 NEXT APPT 1211/24

## 2023-06-17 ENCOUNTER — Ambulatory Visit (INDEPENDENT_AMBULATORY_CARE_PROVIDER_SITE_OTHER): Payer: Medicare HMO | Admitting: Family Medicine

## 2023-06-17 ENCOUNTER — Encounter: Payer: Self-pay | Admitting: Family Medicine

## 2023-06-17 VITALS — BP 108/46 | HR 66 | Ht 68.0 in | Wt 181.0 lb

## 2023-06-17 DIAGNOSIS — D692 Other nonthrombocytopenic purpura: Secondary | ICD-10-CM

## 2023-06-17 DIAGNOSIS — I214 Non-ST elevation (NSTEMI) myocardial infarction: Secondary | ICD-10-CM

## 2023-06-17 DIAGNOSIS — Z794 Long term (current) use of insulin: Secondary | ICD-10-CM

## 2023-06-17 DIAGNOSIS — R300 Dysuria: Secondary | ICD-10-CM

## 2023-06-17 DIAGNOSIS — R0609 Other forms of dyspnea: Secondary | ICD-10-CM | POA: Diagnosis not present

## 2023-06-17 DIAGNOSIS — F332 Major depressive disorder, recurrent severe without psychotic features: Secondary | ICD-10-CM | POA: Diagnosis not present

## 2023-06-17 DIAGNOSIS — E11630 Type 2 diabetes mellitus with periodontal disease: Secondary | ICD-10-CM | POA: Diagnosis not present

## 2023-06-17 LAB — POCT URINALYSIS DIP (CLINITEK)
Bilirubin, UA: NEGATIVE
Glucose, UA: >=1000 mg/dL — AB
Ketones, POC UA: NEGATIVE mg/dL
Nitrite, UA: NEGATIVE
POC PROTEIN,UA: NEGATIVE
Spec Grav, UA: 1.02 (ref 1.010–1.025)
Urobilinogen, UA: 0.2 E.U./dL
pH, UA: 5.5 (ref 5.0–8.0)

## 2023-06-17 LAB — POCT GLYCOSYLATED HEMOGLOBIN (HGB A1C): Hemoglobin A1C: 8.4 % — AB (ref 4.0–5.6)

## 2023-06-17 MED ORDER — CIPROFLOXACIN HCL 500 MG PO TABS
500.0000 mg | ORAL_TABLET | Freq: Two times a day (BID) | ORAL | 0 refills | Status: AC
Start: 2023-06-17 — End: 2023-06-22

## 2023-06-17 MED ORDER — JARDIANCE 25 MG PO TABS: 25.0000 mg | ORAL_TABLET | Freq: Every day | ORAL | 3 refills | Status: DC

## 2023-06-17 MED ORDER — METOPROLOL SUCCINATE ER 25 MG PO TB24: 25.0000 mg | ORAL_TABLET | Freq: Every day | ORAL | 1 refills | Status: DC

## 2023-06-17 NOTE — Assessment & Plan Note (Addendum)
Needs to restart his ASA.  Was due to follow-up with cardiology in May will help get that scheduled and arrange she definitely needs to try to get them in the next couple of months as he is coming up on his 1 year anniversary from his MI.

## 2023-06-17 NOTE — Assessment & Plan Note (Signed)
Follows with psychiatry for medication management.

## 2023-06-17 NOTE — Assessment & Plan Note (Addendum)
A1c jumped up to 8.4 today which is uncontrolled he says he is taking his medications some not sure what has changed significantly and that he went from 7.4-8.4.  He has not been checking his blood sugars at home.  He has been giving 15 units of Toujeo. Continue ACE and statin.

## 2023-06-17 NOTE — Assessment & Plan Note (Addendum)
Gave reassurance.  That the aspirin was making the bleeding worse.  Wanted to know if he should restart the aspirin.  He was started on it after he had his heart attack last year.  Encouraged him to restart his aspirin but that he would be able to come off of the Brilinta in November and that should help significantly with the bruising and bleeding.

## 2023-06-17 NOTE — Progress Notes (Signed)
Established Patient Office Visit  Subjective   Patient ID: Gabriel Gilbert, male    DOB: Apr 04, 1951  Age: 72 y.o. MRN: [id number]  Chief Complaint  Patient presents with   Diabetes    HPI  Diabetes - no hypoglycemic events. No wounds or sores that are not healing well. No increased thirst or urination. Checking glucose at home. Taking medications as prescribed without any side effects.  He also reports that for about a month he has been having some urinary frequency, urgency and occasionally some burning.  Has been taking over-the-counter Azo but has not been really getting a lot of relief with it.  He also had some right leg pain and felt like he could barely move it from [place] 2 days after he slept on his couch.  He says now his symptoms have completely cleared up but just wanted to let me know about it.  His friend is with him today reports that he breathes really hard when he is physically active or walking.  He has not noted any cough or sputum production or chest tightness.   1 let me know that he stopped his aspirin because he was getting some easy bruising and bleeding.     ROS    Objective:     BP (!) 108/46   Pulse 66   Ht 5\' 8"  (1.727 m)   Wt 181 lb (82.1 kg)   SpO2 97%   BMI 27.52 kg/m    Physical Exam Constitutional:      Appearance: He is well-developed.  HENT:     Head: Normocephalic and atraumatic.  Cardiovascular:     Rate and Rhythm: Normal rate and regular rhythm.     Heart sounds: Normal heart sounds.  Pulmonary:     Effort: Pulmonary effort is normal.     Breath sounds: Normal breath sounds.  Skin:    General: Skin is warm and dry.  Neurological:     Mental Status: He is alert and oriented to person, place, and time.  Psychiatric:        Behavior: Behavior normal.     Results for orders placed or performed in visit on 06/17/23  POCT glycosylated hemoglobin (Hb A1C)  Result Value Ref Range   Hemoglobin A1C 8.4 (A) 4.0 - 5.6 %   HbA1c  POC (<> result, manual entry)     HbA1c, POC (prediabetic range)     HbA1c, POC (controlled diabetic range)    POCT URINALYSIS DIP (CLINITEK)  Result Value Ref Range   Color, UA yellow yellow   Clarity, UA cloudy (A) clear   Glucose, UA >=1,000 (A) negative mg/dL   Bilirubin, UA negative negative   Ketones, POC UA negative negative mg/dL   Spec Grav, UA 4.098 1.191 - 1.025   Blood, UA trace-lysed (A) negative   pH, UA 5.5 5.0 - 8.0   POC PROTEIN,UA negative negative, trace   Urobilinogen, UA 0.2 0.2 or 1.0 E.U./dL   Nitrite, UA Negative Negative   Leukocytes, UA Trace (A) Negative      The ASCVD Risk score (Arnett DK, et al., 2019) failed to calculate for the following reasons:   The patient has a prior MI or stroke diagnosis    Assessment & Plan:   Problem List Items Addressed This Visit       Cardiovascular and Mediastinum   Senile purpura (HCC)    Gave reassurance.  That the aspirin was making the bleeding worse.  Wanted to know  if he should restart the aspirin.  He was started on it after he had his heart attack last year.  Encouraged him to restart his aspirin but that he would be able to come off of the Brilinta in November and that should help significantly with the bruising and bleeding.      Relevant Medications   aspirin EC 81 MG tablet   metoprolol succinate (TOPROL-XL) 25 MG 24 hr tablet   Other Relevant Orders   CMP14+EGFR   Lipid panel   CBC   NSTEMI (non-ST elevated myocardial infarction) (HCC)    Needs to restart his ASA.  Was due to follow-up with cardiology in May will help get that scheduled and arrange she definitely needs to try to get them in the next couple of months as he is coming up on his 1 year anniversary from his MI.      Relevant Medications   aspirin EC 81 MG tablet   metoprolol succinate (TOPROL-XL) 25 MG 24 hr tablet   Other Relevant Orders   Ambulatory referral to Cardiology   CMP14+EGFR   Lipid panel   CBC     Endocrine    Type 2 diabetes mellitus with periodontal complication (HCC) - Primary    A1c jumped up to 8.4 today which is uncontrolled he says he is taking his medications some not sure what has changed significantly and that he went from 7.4-8.4.  He has not been checking his blood sugars at home.  He has been giving 15 units of Toujeo. Continue ACE and statin.       Relevant Medications   aspirin EC 81 MG tablet   JARDIANCE 25 MG TABS tablet   Other Relevant Orders   POCT glycosylated hemoglobin (Hb A1C) (Completed)   CMP14+EGFR   Lipid panel   CBC     Other   Severe episode of recurrent major depressive disorder (HCC)    Follows with psychiatry for medication management.      Other Visit Diagnoses     Dyspnea on minimal exertion       Dysuria       Relevant Medications   ciprofloxacin (CIPRO) 500 MG tablet   Other Relevant Orders   POCT URINALYSIS DIP (CLINITEK) (Completed)   Urine Culture      Dyspnea - will do 6 min walk test today. No hypoxia. Puse and BP low. Will dec metoprolol and f/U in 2 weesk for nurse visit and see if feeling better.  Also consider could be cardiac related so again we will try to get him back in with his cardiologist.  He is not having any other respiratory symptoms such as cough fever etc. so we did hold off on a chest x-ray today.   Return in about 2 weeks (around 07/01/2023) for Nurse visit for BP.   I spent 40 minutes on the day of the encounter to include pre-visit record review, face-to-face time with the patient and post visit ordering of test.   Nani Gasser, MD

## 2023-06-17 NOTE — Patient Instructions (Addendum)
Please restart your aspirin.  You are taking this medication because you have had a heart attack and it is to prevent you from having a second one.    Antiplatelet regimen Brilinta, aspirin. May discontinue Brilinta on 09/14/2023  We need to get you back in with Cardiology.    Please stop the metoprolol for 50mg  .  Sent over new prescription today for 25 mg to take its place.  Decreasing your dose because of low blood pressure and low pulse.

## 2023-06-18 ENCOUNTER — Encounter: Payer: Self-pay | Admitting: Emergency Medicine

## 2023-06-20 NOTE — Progress Notes (Signed)
Hi Jasier, metabolic panel including kidney and liver stable.  LDL cholesterol is under 100 but triglycerides are still up a little bit.  Though better than last year so good work and bringing that down.  Blood count is normal no sign of anemia or infection and urine culture is negative.  So if you are still having the urinary issues I would like to get you in with a urologist.  It could be more related to a prostate issue versus a bladder infection.  If you are okay with referral then please let me know if you have a preference for provider location then please let me know.

## 2023-06-26 ENCOUNTER — Other Ambulatory Visit: Payer: Self-pay | Admitting: Family Medicine

## 2023-06-26 DIAGNOSIS — E11630 Type 2 diabetes mellitus with periodontal disease: Secondary | ICD-10-CM

## 2023-06-26 DIAGNOSIS — Z794 Long term (current) use of insulin: Secondary | ICD-10-CM

## 2023-06-26 DIAGNOSIS — R300 Dysuria: Secondary | ICD-10-CM

## 2023-07-01 ENCOUNTER — Ambulatory Visit (INDEPENDENT_AMBULATORY_CARE_PROVIDER_SITE_OTHER): Payer: Medicare HMO | Admitting: Family Medicine

## 2023-07-01 VITALS — BP 116/60 | HR 60

## 2023-07-01 DIAGNOSIS — I10 Essential (primary) hypertension: Secondary | ICD-10-CM

## 2023-07-01 NOTE — Progress Notes (Signed)
   Established Patient Office Visit  Subjective   Patient ID: Gabriel Gilbert, male    DOB: 08-Sep-1951  Age: 72 y.o. MRN: [id number]  Chief Complaint  Patient presents with   Hypertension    HPI  Gabriel Gilbert is here for blood pressure check. Denies chest pain, shortness of breath or dizziness. He did not stop the metoprolol. He is still taking it nightly.   ROS    Objective:     BP 116/60   Pulse 60   SpO2 97%    Physical Exam   No results found for any visits on 07/01/23.    The ASCVD Risk score (Arnett DK, et al., 2019) failed to calculate for the following reasons:   The patient has a prior MI or stroke diagnosis    Assessment & Plan:  Blood pressure check. - Patient advised to continue current medications as directed and follow up in 3 months.   Problem List Items Addressed This Visit       Unprioritized   Essential hypertension, benign - Primary    Return in about 3 months (around 10/01/2023) for HTN with Dr [name]. Earna Coder, Janalyn Harder, CMA

## 2023-07-03 ENCOUNTER — Telehealth: Payer: Self-pay | Admitting: Family Medicine

## 2023-07-03 DIAGNOSIS — Z794 Long term (current) use of insulin: Secondary | ICD-10-CM

## 2023-07-03 NOTE — Telephone Encounter (Signed)
order

## 2023-07-04 NOTE — Telephone Encounter (Signed)
Called pt and his voicemail is full

## 2023-07-05 ENCOUNTER — Telehealth (HOSPITAL_COMMUNITY): Payer: Self-pay | Admitting: *Deleted

## 2023-07-05 DIAGNOSIS — F411 Generalized anxiety disorder: Secondary | ICD-10-CM

## 2023-07-05 MED ORDER — ALPRAZOLAM 0.5 MG PO TABS
0.5000 mg | ORAL_TABLET | Freq: Every day | ORAL | 3 refills | Status: DC | PRN
Start: 2023-07-05 — End: 2023-10-01

## 2023-07-05 NOTE — Addendum Note (Signed)
Addended by: Thresa Ross on: 07/05/2023 10:50 AM   Modules accepted: Orders

## 2023-07-05 NOTE — Telephone Encounter (Signed)
PATIENT REFILL REQUEST--ALPRAZolam (XANAX) 0.5 MG tablet    [facility] [id number] - [place], [place] - [address]   NEXT APPT --10/23/23 LAST APPT --05/03/23

## 2023-07-12 DIAGNOSIS — R350 Frequency of micturition: Secondary | ICD-10-CM | POA: Diagnosis not present

## 2023-07-12 DIAGNOSIS — I10 Essential (primary) hypertension: Secondary | ICD-10-CM | POA: Diagnosis not present

## 2023-08-05 ENCOUNTER — Telehealth (HOSPITAL_COMMUNITY): Payer: Self-pay | Admitting: *Deleted

## 2023-08-05 NOTE — Telephone Encounter (Signed)
PER PROVIDER -- He should have refills if sent 3 last month.  --UNABLE TO -- Please confirm  CALLED [facility] X 4 & KEPT BEGIN SENT TO VM  CALLED PATIENT TO INFORM He should have refills sent 3 last month.  & THAT HE WOULD NEED TO FOLLOW UP WITH [facility] SINCE I KEPT GOING TO VM

## 2023-08-05 NOTE — Telephone Encounter (Signed)
Patient Refill Request -- Disp Refills Start End   ALPRAZolam (XANAX) 0.5 MG tablet 30 tablet 3 07/05/2023 11/02/2023   Sig - Route: Take 1 tablet (0.5 mg total) by mouth daily as needed. - Oral   Sent to pharmacy as: ALPRAZolam Prudy Feeler) 0.5 MG tablet   E-Prescribing Status: Receipt confirmed by pharmacy (07/05/2023 10:50 AM EDT)    Associated Diagnoses  GAD (generalized anxiety disorder)     Pharmacy  [facility] [id number] - [place], [place] - [address]

## 2023-08-12 DIAGNOSIS — R309 Painful micturition, unspecified: Secondary | ICD-10-CM | POA: Diagnosis not present

## 2023-08-12 DIAGNOSIS — I10 Essential (primary) hypertension: Secondary | ICD-10-CM | POA: Diagnosis not present

## 2023-08-12 DIAGNOSIS — N138 Other obstructive and reflux uropathy: Secondary | ICD-10-CM | POA: Diagnosis not present

## 2023-08-12 DIAGNOSIS — Z1330 Encounter for screening examination for mental health and behavioral disorders, unspecified: Secondary | ICD-10-CM | POA: Diagnosis not present

## 2023-08-12 DIAGNOSIS — R350 Frequency of micturition: Secondary | ICD-10-CM | POA: Diagnosis not present

## 2023-08-12 DIAGNOSIS — N401 Enlarged prostate with lower urinary tract symptoms: Secondary | ICD-10-CM | POA: Diagnosis not present

## 2023-08-19 DIAGNOSIS — M2041 Other hammer toe(s) (acquired), right foot: Secondary | ICD-10-CM | POA: Diagnosis not present

## 2023-08-19 DIAGNOSIS — E1142 Type 2 diabetes mellitus with diabetic polyneuropathy: Secondary | ICD-10-CM | POA: Diagnosis not present

## 2023-08-19 DIAGNOSIS — M792 Neuralgia and neuritis, unspecified: Secondary | ICD-10-CM | POA: Diagnosis not present

## 2023-08-19 DIAGNOSIS — I10 Essential (primary) hypertension: Secondary | ICD-10-CM | POA: Diagnosis not present

## 2023-08-19 DIAGNOSIS — B351 Tinea unguium: Secondary | ICD-10-CM | POA: Diagnosis not present

## 2023-08-19 DIAGNOSIS — M2042 Other hammer toe(s) (acquired), left foot: Secondary | ICD-10-CM | POA: Diagnosis not present

## 2023-08-19 DIAGNOSIS — I739 Peripheral vascular disease, unspecified: Secondary | ICD-10-CM | POA: Diagnosis not present

## 2023-08-21 ENCOUNTER — Other Ambulatory Visit (HOSPITAL_COMMUNITY): Payer: Self-pay | Admitting: Psychiatry

## 2023-09-19 ENCOUNTER — Telehealth: Payer: Self-pay | Admitting: Family Medicine

## 2023-09-19 DIAGNOSIS — F5105 Insomnia due to other mental disorder: Secondary | ICD-10-CM | POA: Diagnosis not present

## 2023-09-19 DIAGNOSIS — G4733 Obstructive sleep apnea (adult) (pediatric): Secondary | ICD-10-CM | POA: Diagnosis not present

## 2023-09-19 DIAGNOSIS — Z72821 Inadequate sleep hygiene: Secondary | ICD-10-CM | POA: Diagnosis not present

## 2023-09-19 DIAGNOSIS — I10 Essential (primary) hypertension: Secondary | ICD-10-CM | POA: Diagnosis not present

## 2023-09-19 DIAGNOSIS — F99 Mental disorder, not otherwise specified: Secondary | ICD-10-CM | POA: Diagnosis not present

## 2023-09-19 DIAGNOSIS — F17210 Nicotine dependence, cigarettes, uncomplicated: Secondary | ICD-10-CM | POA: Diagnosis not present

## 2023-09-19 NOTE — Telephone Encounter (Signed)
Ok to cut lisinopril in half and take half  tab daily.

## 2023-09-19 NOTE — Telephone Encounter (Signed)
Patient called he has his BP checked at [facility] is was 100/75 he wanted to know if he needs to adjust his medication. Please advise   Phone [phone]

## 2023-09-20 NOTE — Telephone Encounter (Signed)
Patient advised.

## 2023-10-01 ENCOUNTER — Encounter: Payer: Self-pay | Admitting: Family Medicine

## 2023-10-01 ENCOUNTER — Telehealth (HOSPITAL_COMMUNITY): Payer: Self-pay | Admitting: *Deleted

## 2023-10-01 ENCOUNTER — Ambulatory Visit (INDEPENDENT_AMBULATORY_CARE_PROVIDER_SITE_OTHER): Payer: Medicare HMO | Admitting: Family Medicine

## 2023-10-01 VITALS — BP 121/54 | HR 74 | Ht 68.0 in | Wt 187.0 lb

## 2023-10-01 DIAGNOSIS — E11630 Type 2 diabetes mellitus with periodontal disease: Secondary | ICD-10-CM

## 2023-10-01 DIAGNOSIS — Z794 Long term (current) use of insulin: Secondary | ICD-10-CM

## 2023-10-01 DIAGNOSIS — E1129 Type 2 diabetes mellitus with other diabetic kidney complication: Secondary | ICD-10-CM | POA: Diagnosis not present

## 2023-10-01 DIAGNOSIS — Z23 Encounter for immunization: Secondary | ICD-10-CM

## 2023-10-01 DIAGNOSIS — F411 Generalized anxiety disorder: Secondary | ICD-10-CM

## 2023-10-01 DIAGNOSIS — I10 Essential (primary) hypertension: Secondary | ICD-10-CM | POA: Diagnosis not present

## 2023-10-01 DIAGNOSIS — R809 Proteinuria, unspecified: Secondary | ICD-10-CM | POA: Diagnosis not present

## 2023-10-01 LAB — POCT GLYCOSYLATED HEMOGLOBIN (HGB A1C): Hemoglobin A1C: 7.2 % — AB (ref 4.0–5.6)

## 2023-10-01 LAB — POCT UA - MICROALBUMIN
Creatinine, POC: 50 mg/dL
Microalbumin Ur, POC: 10 mg/L

## 2023-10-01 MED ORDER — LISINOPRIL 10 MG PO TABS
10.0000 mg | ORAL_TABLET | Freq: Every day | ORAL | 1 refills | Status: AC
Start: 2023-10-01 — End: ?

## 2023-10-01 MED ORDER — TOUJEO SOLOSTAR 300 UNIT/ML ~~LOC~~ SOPN
37.0000 [IU] | PEN_INJECTOR | Freq: Every day | SUBCUTANEOUS | 1 refills | Status: DC
Start: 2023-10-01 — End: 2024-04-01

## 2023-10-01 MED ORDER — ALPRAZOLAM 0.5 MG PO TABS
0.5000 mg | ORAL_TABLET | Freq: Every day | ORAL | 1 refills | Status: DC | PRN
Start: 2023-10-01 — End: 2023-10-31

## 2023-10-01 NOTE — Assessment & Plan Note (Signed)
BP looks great on half a tab of the lisinopril so we will send over a new prescription for 10 mg dose so he does not have to split it.  He is showing some microalbuminuria so the 10 mg dose is protective so encouraged him to stick with it.

## 2023-10-01 NOTE — Assessment & Plan Note (Signed)
Continue daily ACEi.

## 2023-10-01 NOTE — Addendum Note (Signed)
Addended by: Thresa Ross on: 10/01/2023 11:41 AM   Modules accepted: Orders

## 2023-10-01 NOTE — Progress Notes (Signed)
Established Patient Office Visit  Subjective   Patient ID: Gabriel [place], male    DOB: Jun 16, 1951  Age: 72 y.o. MRN: [id number]  Chief Complaint  Patient presents with   Hypertension   Diabetes    HPI  Hypertension- Pt denies chest pain, SOB, dizziness, or heart palpitations.  Taking meds as directed w/o problems.  Denies medication side effects.  He did start splitting the lisinopril in half because his blood pressures were going low he  Diabetes - no hypoglycemic events. No wounds or sores that are not healing well. No increased thirst or urination. Checking glucose at home. Taking medications as prescribed without any side effects.  He is on 35 units of Toujeo daily.  He does not check his blood sugars.  He says he has not felt any days where he is felt bad.     ROS    Objective:     BP (!) 121/54   Pulse 74   Ht 5\' 8"  (1.727 m)   Wt 187 lb (84.8 kg)   SpO2 96%   BMI 28.43 kg/m    Physical Exam Vitals and nursing note reviewed.  Constitutional:      Appearance: Normal appearance.  HENT:     Head: Normocephalic and atraumatic.  Eyes:     Conjunctiva/sclera: Conjunctivae normal.  Cardiovascular:     Rate and Rhythm: Normal rate and regular rhythm.  Pulmonary:     Effort: Pulmonary effort is normal.     Breath sounds: Normal breath sounds.  Skin:    General: Skin is warm and dry.  Neurological:     Mental Status: He is alert.  Psychiatric:        Mood and Affect: Mood normal.      Results for orders placed or performed in visit on 10/01/23  POCT HgB A1C  Result Value Ref Range   Hemoglobin A1C 7.2 (A) 4.0 - 5.6 %   HbA1c POC (<> result, manual entry)     HbA1c, POC (prediabetic range)     HbA1c, POC (controlled diabetic range)    POCT UA - Microalbumin  Result Value Ref Range   Microalbumin Ur, POC 10 mg/L   Creatinine, POC 50 mg/dL   Albumin/Creatinine Ratio, Urine, POC 30-300       The ASCVD Risk score (Arnett DK, et al., 2019) failed to  calculate for the following reasons:   The patient has a prior MI or stroke diagnosis    Assessment & Plan:   Problem List Items Addressed This Visit       Cardiovascular and Mediastinum   Essential hypertension, benign    BP looks great on half a tab of the lisinopril so we will send over a new prescription for 10 mg dose so he does not have to split it.  He is showing some microalbuminuria so the 10 mg dose is protective so encouraged him to stick with it.      Relevant Medications   lisinopril (ZESTRIL) 10 MG tablet     Digestive   Type 2 diabetes mellitus with periodontal complication (HCC) - Primary    Seen much improved and down to 7.2 today continue to work on healthy diet and regular exercise will increase Toujeo from 35 units to 37 units.  New prescription sent to the pharmacy.  Urine microalbumin performed today.  Still showing some mild proteinuria      Relevant Medications   insulin glargine, 1 Unit Dial, (TOUJEO SOLOSTAR) 300 UNIT/ML  Solostar Pen   lisinopril (ZESTRIL) 10 MG tablet   Other Relevant Orders   POCT HgB A1C (Completed)   POCT UA - Microalbumin (Completed)     Endocrine   Microalbuminuria due to type 2 diabetes mellitus (HCC)    Continue daily ACEi.       Relevant Medications   insulin glargine, 1 Unit Dial, (TOUJEO SOLOSTAR) 300 UNIT/ML Solostar Pen   lisinopril (ZESTRIL) 10 MG tablet   Other Visit Diagnoses     Encounter for immunization       Relevant Orders   Flu Vaccine Trivalent High Dose (Fluad) (Completed)       Return in about 3 months (around 01/01/2024) for Diabetes follow-up, Hypertension.    Nani Gasser, MD

## 2023-10-01 NOTE — Telephone Encounter (Signed)
Patient Refill Request [facility] [id number] - [place], [place] - [address]   ALPRAZolam (XANAX) 0.5 MG tablet   NEXT APPT --10/23/23 LAST APPT --05/03/23

## 2023-10-01 NOTE — Assessment & Plan Note (Signed)
Seen much improved and down to 7.2 today continue to work on healthy diet and regular exercise will increase Toujeo from 35 units to 37 units.  New prescription sent to the pharmacy.  Urine microalbumin performed today.  Still showing some mild proteinuria

## 2023-10-06 ENCOUNTER — Other Ambulatory Visit: Payer: Self-pay | Admitting: Family Medicine

## 2023-10-09 DIAGNOSIS — I10 Essential (primary) hypertension: Secondary | ICD-10-CM | POA: Diagnosis not present

## 2023-10-09 DIAGNOSIS — I214 Non-ST elevation (NSTEMI) myocardial infarction: Secondary | ICD-10-CM | POA: Diagnosis not present

## 2023-10-09 DIAGNOSIS — I2510 Atherosclerotic heart disease of native coronary artery without angina pectoris: Secondary | ICD-10-CM | POA: Diagnosis not present

## 2023-10-09 DIAGNOSIS — E782 Mixed hyperlipidemia: Secondary | ICD-10-CM | POA: Diagnosis not present

## 2023-10-23 ENCOUNTER — Encounter (HOSPITAL_COMMUNITY): Payer: Self-pay

## 2023-10-23 ENCOUNTER — Telehealth (HOSPITAL_COMMUNITY): Payer: Medicare HMO | Admitting: Psychiatry

## 2023-10-30 ENCOUNTER — Other Ambulatory Visit: Payer: Self-pay | Admitting: Family Medicine

## 2023-10-30 DIAGNOSIS — Z794 Long term (current) use of insulin: Secondary | ICD-10-CM

## 2023-10-31 ENCOUNTER — Telehealth (HOSPITAL_COMMUNITY): Payer: Self-pay | Admitting: *Deleted

## 2023-10-31 DIAGNOSIS — F411 Generalized anxiety disorder: Secondary | ICD-10-CM

## 2023-10-31 MED ORDER — ALPRAZOLAM 0.5 MG PO TABS: 0.5000 mg | ORAL_TABLET | Freq: Every day | ORAL | 1 refills | Status: DC | PRN

## 2023-10-31 NOTE — Telephone Encounter (Signed)
[facility] [id number] - [place], [place] - [address]    ALPRAZolam (XANAX) 0.5 MG tablet   NEXT APPT --11/01/23 LAST APPT --05/03/23

## 2023-10-31 NOTE — Addendum Note (Signed)
Addended by: Thresa Ross on: 10/31/2023 09:21 AM   Modules accepted: Orders

## 2023-11-01 ENCOUNTER — Encounter (HOSPITAL_COMMUNITY): Payer: Self-pay | Admitting: Psychiatry

## 2023-11-01 ENCOUNTER — Telehealth (INDEPENDENT_AMBULATORY_CARE_PROVIDER_SITE_OTHER): Payer: Medicare HMO | Admitting: Psychiatry

## 2023-11-01 DIAGNOSIS — F331 Major depressive disorder, recurrent, moderate: Secondary | ICD-10-CM

## 2023-11-01 DIAGNOSIS — F411 Generalized anxiety disorder: Secondary | ICD-10-CM

## 2023-11-01 DIAGNOSIS — Z789 Other specified health status: Secondary | ICD-10-CM

## 2023-11-01 DIAGNOSIS — F1090 Alcohol use, unspecified, uncomplicated: Secondary | ICD-10-CM

## 2023-11-01 NOTE — Progress Notes (Signed)
[facility] Outpatient Follow up visit  Telepsych Patient Identification: Gabriel [place] MRN:  [id number] Date of Evaluation:  11/01/2023 Referral Source: primary care.  Chief Complaint:    anxiety Visit Diagnosis:    ICD-10-CM   1. GAD (generalized anxiety disorder)  F41.1     2. Major depressive disorder, recurrent episode, moderate (HCC)  F33.1     3. Alcohol use  Z78.9      Virtual Visit via Video Note  I connected with Gabriel [place] on 11/01/23 at 11:00 AM EST by a video enabled telemedicine application and verified that I am speaking with the correct person using two identifiers.  Location: Patient: store Provider: home office   I discussed the limitations of evaluation and management by telemedicine and the availability of in person appointments. The patient expressed understanding and agreed to proceed.      I discussed the assessment and treatment plan with the patient. The patient was provided an opportunity to ask questions and all were answered. The patient agreed with the plan and demonstrated an understanding of the instructions.   The patient was advised to call back or seek an in-person evaluation if the symptoms worsen or if the condition fails to improve as anticipated.  I provided 18 minutes of non-face-to-face time during this encounter.      HPI:  Chart reviewed, follows with pcp On eval doing fair, denies significant stressors, feels depression is managable, somewhat not regular with paxil so we talked of compliance  Denies alcohol use  Anxiety manageable with xanax, understands the risk  Goes to [facility] [employer] and outside   Motivation baseline or low  Aggravating factor: lonliness  ,modifying factor: dog,  Some friends, goes to [employer].  Timing in morning   Past Psychiatric History: depression ,anxiety  Previous Psychotropic Medications: Yes   Substance Abuse History in the last 12 months:  No.  Consequences of Substance Abuse: NA  Past  Medical History:  Past Medical History:  Diagnosis Date  . Bell's palsy 12-10  . Depression   . Diabetes mellitus    type 2  . Hyperlipidemia   . Hypertension     Past Surgical History:  Procedure Laterality Date  . CATARACT EXTRACTION Left   . kidney stone removed    . pfts     normal    Family Psychiatric History: anxiety. Mom used valium  Family History:  Family History  Problem Relation Age of Onset  . COPD Mother   . Hypertension Mother   . Alzheimer's disease Mother        ?  . Diabetes Father   . COPD Father   . Drug abuse Son   . COPD Sister   . Cancer Sister        lung    Social History:   Social History   Socioeconomic History  . Marital status: Divorced    Spouse name: Not on file  . Number of children: 1  . Years of education: 61  . Highest education level: 12th grade  Occupational History  . Occupation: Retired  Tobacco Use  . Smoking status: Every Day    Current packs/day: 0.00    Average packs/day: 1 pack/day for 25.0 years (25.0 ttl pk-yrs)    Types: Cigarettes    Start date: 02/11/1988    Last attempt to quit: 02/10/2013    Years since quitting: 10.7  . Smokeless tobacco: Current    Types: Chew  . Tobacco comments:  using electronic cigarettes  Vaping Use  . Vaping status: Every Day  Substance and Sexual Activity  . Alcohol use: No    Alcohol/week: 0.0 standard drinks of alcohol  . Drug use: No  . Sexual activity: Not Currently    Partners: Female  Other Topics Concern  . Not on file  Social History Narrative   Lives alone. He has one child. He enjoys watching television.    Social Drivers of Corporate investment banker Strain: Low Risk  (08/12/2023)   Received from [facility]   Overall Financial Resource Strain (CARDIA)   . Difficulty of Paying Living Expenses: Not hard at all  Food Insecurity: No Food Insecurity (08/12/2023)   Received from [facility]   Hunger Vital Sign   . Worried About Programme researcher, broadcasting/film/video in the  Last Year: Never true   . Ran Out of Food in the Last Year: Never true  Transportation Needs: No Transportation Needs (08/12/2023)   Received from [facility] - Transportation   . Lack of Transportation (Medical): No   . Lack of Transportation (Non-Medical): No  Physical Activity: Inactive (02/19/2023)   Exercise Vital Sign   . Days of Exercise per Week: 0 days   . Minutes of Exercise per Session: 0 min  Stress: No Stress Concern Present (02/19/2023)   [employer] of Occupational Health - Occupational Stress Questionnaire   . Feeling of Stress : Only a little  Social Connections: Socially Isolated (02/19/2023)   Social Connection and Isolation Panel [NHANES]   . Frequency of Communication with Friends and Family: Once a week   . Frequency of Social Gatherings with Friends and Family: More than three times a week   . Attends Religious Services: Never   . Active Member of Clubs or Organizations: No   . Attends Banker Meetings: Never   . Marital Status: Divorced      Allergies:  No Known Allergies  Metabolic Disorder Labs: Lab Results  Component Value Date   HGBA1C 7.2 (A) 10/01/2023   No results found for: "PROLACTIN" Lab Results  Component Value Date   CHOL 154 06/17/2023   TRIG 185 (H) 06/17/2023   HDL 41 06/17/2023   CHOLHDL 3.8 06/17/2023   VLDL 22 01/17/2016   LDLCALC 82 06/17/2023   LDLCALC 72 12/27/2021     Current Medications: Current Outpatient Medications  Medication Sig Dispense Refill  . ALPRAZolam (XANAX) 0.5 MG tablet Take 1 tablet (0.5 mg total) by mouth daily as needed. 30 tablet 1  . aspirin EC 81 MG tablet Take 81 mg by mouth daily. Swallow whole.    Marland Kitchen BRILINTA 90 MG TABS tablet Take 90 mg by mouth 2 (two) times daily.    . DROPLET PEN NEEDLES 31G X 5 MM MISC USE FOR INJECTION DAILY 100 each 9  . glucose blood (ONE TOUCH TEST STRIPS) test strip Pt testing one time a day. 100 each 3  . insulin glargine, 1 Unit Dial, (TOUJEO  SOLOSTAR) 300 UNIT/ML Solostar Pen Inject 37-40 Units into the skin at bedtime. 9 mL 1  . JARDIANCE 25 MG TABS tablet Take 1 tablet (25 mg total) by mouth daily. Take 25 mg by mouth daily. 90 tablet 3  . lisinopril (ZESTRIL) 10 MG tablet Take 1 tablet (10 mg total) by mouth daily. 90 tablet 1  . metFORMIN (GLUCOPHAGE) 1000 MG tablet TAKE 1 TABLET BY MOUTH TWICE A DAY WITH A MEAL 180 tablet 1  .  metoprolol succinate (TOPROL-XL) 25 MG 24 hr tablet Take 1 tablet (25 mg total) by mouth daily. 90 tablet 1  . Misc. Devices MISC New CPAP machine with mask and supplies through [facility].  CPAP therapy at 14 cm. water pressure.    . ONE TOUCH ULTRA TEST test strip TEST as directed 100 each 3  . PARoxetine (PAXIL) 20 MG tablet TAKE 1 TABLET BY MOUTH DAILY 30 tablet 0  . rosuvastatin (CRESTOR) 10 MG tablet Take 1 tablet (10 mg total) by mouth daily. 90 tablet 3  . tamsulosin (FLOMAX) 0.4 MG CAPS capsule Take 1 capsule (0.4 mg total) by mouth daily. 30 capsule 3   No current facility-administered medications for this visit.      Psychiatric Specialty Exam: Review of Systems  Cardiovascular:  Negative for chest pain and palpitations.  Psychiatric/Behavioral:  Negative for suicidal ideas.     There were no vitals taken for this visit.There is no height or weight on file to calculate BMI.  General Appearance: casual  Eye Contact:  fair  Speech:  Slow  Volume:  Decreased  Mood:fair  Affect:   Thought Process:  Goal Directed  Orientation:  Full (Time, Place, and Person)  Thought Content:  Rumination  Suicidal Thoughts:  No  Homicidal Thoughts:  No  Memory:  Immediate;   Fair Recent;   Fair  Judgement:  Poor  Insight:  Shallow  Psychomotor Activity:  Decreased  Concentration:  Concentration: Fair and Attention Span: Fair  Recall:  [employer] of Knowledge:Fair  Language: Fair  Akathisia:  Negative  Handed:  Right  AIMS (if indicated):  0  Assets:  Desire for Improvement  ADL's:   Intact  Cognition: WNL  Sleep:  fair    Treatment Plan Summary: Medication management and Plan as follows    Prior documentation reviewed  1. Major depression moderate recurrent: manageable continue paxil, discussed compliance as not sure if he is taking regularly   2.GAD: managealble with xanax, discussed compliance with paxil  3. Alcohol use: discussed abstinence, relapse prevention Meds if due sent .  Fu 15m.   Thresa Ross, MD 12/20/202411:08 AM

## 2023-11-25 ENCOUNTER — Telehealth (HOSPITAL_COMMUNITY): Payer: Self-pay | Admitting: *Deleted

## 2023-11-25 DIAGNOSIS — F411 Generalized anxiety disorder: Secondary | ICD-10-CM

## 2023-11-25 MED ORDER — ALPRAZOLAM 0.5 MG PO TABS: 0.5000 mg | ORAL_TABLET | Freq: Every day | ORAL | 2 refills | Status: DC | PRN

## 2023-11-25 NOTE — Telephone Encounter (Signed)
 Refill Request--  [facility] [id number] - [place], [place] - [address]    ALPRAZolam (XANAX) 0.5 MG tablet   NEXT APPT -- 05/01/24 LAST APPT -- 11/01/23

## 2023-11-25 NOTE — Addendum Note (Signed)
 Addended by: Thresa Ross on: 11/25/2023 09:19 AM   Modules accepted: Orders

## 2023-12-08 ENCOUNTER — Other Ambulatory Visit: Payer: Self-pay | Admitting: Family Medicine

## 2023-12-08 DIAGNOSIS — E11630 Type 2 diabetes mellitus with periodontal disease: Secondary | ICD-10-CM

## 2023-12-11 DIAGNOSIS — F99 Mental disorder, not otherwise specified: Secondary | ICD-10-CM | POA: Diagnosis not present

## 2023-12-11 DIAGNOSIS — F17290 Nicotine dependence, other tobacco product, uncomplicated: Secondary | ICD-10-CM | POA: Diagnosis not present

## 2023-12-11 DIAGNOSIS — F5105 Insomnia due to other mental disorder: Secondary | ICD-10-CM | POA: Diagnosis not present

## 2023-12-11 DIAGNOSIS — Z87891 Personal history of nicotine dependence: Secondary | ICD-10-CM | POA: Diagnosis not present

## 2023-12-11 DIAGNOSIS — Z72821 Inadequate sleep hygiene: Secondary | ICD-10-CM | POA: Diagnosis not present

## 2023-12-11 DIAGNOSIS — G4733 Obstructive sleep apnea (adult) (pediatric): Secondary | ICD-10-CM | POA: Diagnosis not present

## 2023-12-25 ENCOUNTER — Telehealth (HOSPITAL_COMMUNITY): Payer: Self-pay | Admitting: *Deleted

## 2023-12-25 DIAGNOSIS — F411 Generalized anxiety disorder: Secondary | ICD-10-CM

## 2023-12-25 MED ORDER — ALPRAZOLAM 0.5 MG PO TABS: 0.5000 mg | ORAL_TABLET | Freq: Every day | ORAL | 2 refills | Status: DC | PRN

## 2023-12-25 NOTE — Telephone Encounter (Signed)
Refill Request--  [facility] [id number] - [place], [place] - [address]    ALPRAZolam (XANAX) 0.5 MG tablet   NEXT APPT -- 05/01/24 LAST APPT -- 11/01/23

## 2023-12-25 NOTE — Addendum Note (Signed)
Addended by: Thresa Ross on: 12/25/2023 10:19 AM   Modules accepted: Orders

## 2023-12-27 DIAGNOSIS — Z87891 Personal history of nicotine dependence: Secondary | ICD-10-CM | POA: Diagnosis not present

## 2024-01-02 ENCOUNTER — Ambulatory Visit (INDEPENDENT_AMBULATORY_CARE_PROVIDER_SITE_OTHER): Payer: Medicare HMO | Admitting: Family Medicine

## 2024-01-02 ENCOUNTER — Encounter: Payer: Self-pay | Admitting: Family Medicine

## 2024-01-02 VITALS — BP 128/72 | HR 95 | Ht 68.0 in | Wt 198.0 lb

## 2024-01-02 DIAGNOSIS — Z125 Encounter for screening for malignant neoplasm of prostate: Secondary | ICD-10-CM | POA: Diagnosis not present

## 2024-01-02 DIAGNOSIS — R32 Unspecified urinary incontinence: Secondary | ICD-10-CM | POA: Insufficient documentation

## 2024-01-02 DIAGNOSIS — I10 Essential (primary) hypertension: Secondary | ICD-10-CM | POA: Diagnosis not present

## 2024-01-02 DIAGNOSIS — Z794 Long term (current) use of insulin: Secondary | ICD-10-CM

## 2024-01-02 DIAGNOSIS — E11630 Type 2 diabetes mellitus with periodontal disease: Secondary | ICD-10-CM

## 2024-01-02 DIAGNOSIS — R809 Proteinuria, unspecified: Secondary | ICD-10-CM

## 2024-01-02 DIAGNOSIS — E1129 Type 2 diabetes mellitus with other diabetic kidney complication: Secondary | ICD-10-CM

## 2024-01-02 LAB — POCT GLYCOSYLATED HEMOGLOBIN (HGB A1C): Hemoglobin A1C: 7.3 % — AB (ref 4.0–5.6)

## 2024-01-02 MED ORDER — ROSUVASTATIN CALCIUM 10 MG PO TABS: 10.0000 mg | ORAL_TABLET | Freq: Every day | ORAL | 3 refills | Status: AC

## 2024-01-02 MED ORDER — METFORMIN HCL 1000 MG PO TABS: 1000.0000 mg | ORAL_TABLET | Freq: Two times a day (BID) | ORAL | 3 refills | Status: AC

## 2024-01-02 NOTE — Progress Notes (Signed)
Established Patient Office Visit  Subjective  Patient ID: Gabriel [place], male    DOB: 11-29-1950  Age: 73 y.o. MRN: [id number]  Chief Complaint  Patient presents with   Diabetes   Hypertension    HPI  DM - 37 units of toujeo.  Doesn't check glucose at home. No lows that he can tell.  Taking tabs as well.   He also wanted let me know that he is been having problems with urinary incontinence.  He said he saw urology at 1 point and did try some medication but it really was not helpful.  Also has BPH.  I did find a note from urology at [facility] from September.  They refilled his finasteride and Tymlos then as well as his Myrbetrek.  They did not make any changes at that time.  He would like a second opinion.  Did cut back on fluid intake.    ROS    Objective:     BP 128/72   Pulse 95   Ht 5\' 8"  (1.727 m)   Wt 198 lb (89.8 kg)   SpO2 96%   BMI 30.11 kg/m    Physical Exam Vitals and nursing note reviewed.  Constitutional:      Appearance: Normal appearance.  HENT:     Head: Normocephalic and atraumatic.  Eyes:     Conjunctiva/sclera: Conjunctivae normal.  Cardiovascular:     Rate and Rhythm: Normal rate and regular rhythm.  Pulmonary:     Effort: Pulmonary effort is normal.     Breath sounds: Normal breath sounds.  Skin:    General: Skin is warm and dry.  Neurological:     Mental Status: He is alert.  Psychiatric:        Mood and Affect: Mood normal.      Results for orders placed or performed in visit on 01/02/24  POCT HgB A1C  Result Value Ref Range   Hemoglobin A1C 7.3 (A) 4.0 - 5.6 %   HbA1c POC (<> result, manual entry)     HbA1c, POC (prediabetic range)     HbA1c, POC (controlled diabetic range)        The ASCVD Risk score (Arnett DK, et al., 2019) failed to calculate for the following reasons:   Risk score cannot be calculated because patient has a medical history suggesting prior/existing ASCVD    Assessment & Plan:   Problem List  Items Addressed This Visit       Cardiovascular and Mediastinum   Essential hypertension, benign   Well controlled. Continue current regimen. Follow up in  74mo due for labs       Relevant Medications   rosuvastatin (CRESTOR) 10 MG tablet   Other Relevant Orders   BMP8+EGFR     Digestive   Type 2 diabetes mellitus with periodontal complication (HCC) - Primary   A1c 7.3 today not optimally controlled.  Will increase Toujeo to 39 units for now I do not want a make a bigger change since he does not really check his blood sugars regularly.  We also discussed possibly starting a GLP-1 and maybe being able to even wean him off of the insulin if he does really well on it.  He says he wants to think about it for now.  I did go ahead and refill his rosuvastatin based on refill history he should be out of the medication.  Continue low-dose lisinopril as well.  Will check for B12 deficiency since he has been on  metformin for some time.      Relevant Medications   rosuvastatin (CRESTOR) 10 MG tablet   metFORMIN (GLUCOPHAGE) 1000 MG tablet   Other Relevant Orders   POCT HgB A1C (Completed)   BMP8+EGFR   B12     Endocrine   Microalbuminuria due to type 2 diabetes mellitus (HCC)   Relevant Medications   rosuvastatin (CRESTOR) 10 MG tablet   metFORMIN (GLUCOPHAGE) 1000 MG tablet     Other   Urinary incontinence   Not only having nocturia and frequency he is actually having true incontinence episodes.  He reports he is still taking his tamsulosin but did not mention if he was taking his finasteride as well.  He is no longer on the Myrbetriq because he felt like it was not helpful.  Would like second opinion with urology.      Relevant Orders   Ambulatory referral to Urology   Other Visit Diagnoses       Screening for malignant neoplasm of prostate       Relevant Orders   PSA       Return in about 3 months (around 03/31/2024) for Diabetes follow-up.    Nani Gasser, MD

## 2024-01-02 NOTE — Assessment & Plan Note (Signed)
A1c 7.3 today not optimally controlled.  Will increase Toujeo to 39 units for now I do not want a make a bigger change since he does not really check his blood sugars regularly.  We also discussed possibly starting a GLP-1 and maybe being able to even wean him off of the insulin if he does really well on it.  He says he wants to think about it for now.  I did go ahead and refill his rosuvastatin based on refill history he should be out of the medication.  Continue low-dose lisinopril as well.  Will check for B12 deficiency since he has been on metformin for some time.

## 2024-01-02 NOTE — Assessment & Plan Note (Signed)
 Well controlled. Continue current regimen. Follow up in  19mo due for labs

## 2024-01-02 NOTE — Assessment & Plan Note (Signed)
Not only having nocturia and frequency he is actually having true incontinence episodes.  He reports he is still taking his tamsulosin but did not mention if he was taking his finasteride as well.  He is no longer on the Myrbetriq because he felt like it was not helpful.  Would like second opinion with urology.

## 2024-01-03 ENCOUNTER — Other Ambulatory Visit: Payer: Self-pay | Admitting: Family Medicine

## 2024-01-03 ENCOUNTER — Encounter: Payer: Self-pay | Admitting: Family Medicine

## 2024-01-03 LAB — BMP8+EGFR
BUN/Creatinine Ratio: 12 (ref 10–24)
BUN: 13 mg/dL (ref 8–27)
CO2: 24 mmol/L (ref 20–29)
Calcium: 9.6 mg/dL (ref 8.6–10.2)
Chloride: 100 mmol/L (ref 96–106)
Creatinine, Ser: 1.11 mg/dL (ref 0.76–1.27)
Glucose: 191 mg/dL — ABNORMAL HIGH (ref 70–99)
Potassium: 4.8 mmol/L (ref 3.5–5.2)
Sodium: 137 mmol/L (ref 134–144)
eGFR: 71 mL/min/{1.73_m2} (ref >59)

## 2024-01-03 LAB — PSA: Prostate Specific Ag, Serum: 3.2 ng/mL (ref 0.0–4.0)

## 2024-01-03 LAB — VITAMIN B12: Vitamin B-12: 632 pg/mL (ref 232–1245)

## 2024-01-03 NOTE — Progress Notes (Signed)
Gabriel [place], metabolic panel overall looks good.  Prostate test is on the higher end of normal so I definitely want to repeat that in 6 months instead of a year.  Normally you run between 1 and 2 and this time it was up to 3.2.   And your B12 looks good.

## 2024-01-22 ENCOUNTER — Telehealth (HOSPITAL_COMMUNITY): Payer: Self-pay | Admitting: *Deleted

## 2024-01-22 DIAGNOSIS — F411 Generalized anxiety disorder: Secondary | ICD-10-CM

## 2024-01-22 MED ORDER — ALPRAZOLAM 0.5 MG PO TABS
0.5000 mg | ORAL_TABLET | Freq: Every day | ORAL | 2 refills | Status: DC | PRN
Start: 2024-01-22 — End: 2024-02-24

## 2024-01-22 NOTE — Telephone Encounter (Signed)
 Refill Request--  [facility] [id number] - [place], [place] - [address]    ALPRAZolam (XANAX) 0.5 MG tablet   NEXT APPT -- 05/01/24 LAST APPT -- 11/01/23

## 2024-01-22 NOTE — Addendum Note (Signed)
 Addended by: Thresa Ross on: 01/22/2024 08:54 AM   Modules accepted: Orders

## 2024-02-03 ENCOUNTER — Ambulatory Visit
Admission: EM | Admit: 2024-02-03 | Discharge: 2024-02-03 | Disposition: A | Discharge status: Home / Self Care | Attending: Family Medicine | Admitting: Family Medicine

## 2024-02-03 DIAGNOSIS — H109 Unspecified conjunctivitis: Secondary | ICD-10-CM

## 2024-02-03 DIAGNOSIS — B9689 Other specified bacterial agents as the cause of diseases classified elsewhere: Secondary | ICD-10-CM | POA: Diagnosis not present

## 2024-02-03 DIAGNOSIS — R300 Dysuria: Secondary | ICD-10-CM | POA: Diagnosis not present

## 2024-02-03 MED ORDER — TOBRAMYCIN 0.3 % OP SOLN: 1.0000 [drp] | OPHTHALMIC | 0 refills | Status: DC

## 2024-02-03 MED ORDER — SULFAMETHOXAZOLE-TRIMETHOPRIM 800-160 MG PO TABS: 1.0000 | ORAL_TABLET | Freq: Two times a day (BID) | ORAL | 0 refills | Status: AC

## 2024-02-03 NOTE — Discharge Instructions (Signed)
 Take the antibiotic 2 x a day Use eye drops every 4 h  while awake See your eye doctor if not improving in 48 hours

## 2024-02-03 NOTE — ED Provider Notes (Signed)
 [facility]    CSN: [id number] Arrival date & time: 02/03/24  1321      History   Chief Complaint Chief Complaint  Patient presents with   Eye Drainage    And redness; bilateral    HPI Gabriel Gilbert is a 73 y.o. male.   Patient is here with his wife.  He states his eyes been bothering for a couple of days.  They are red and crusty and irritated.  Vision is okay.  He has yellow discharge from both eyes.  He was not exposed to any chemicals or trauma.  No cold symptoms or runny nose.  Patient does not wear contact lens In addition he complains of dysuria and frequency.  He saw his urologist and was given a medicine to help shrink his prostate.  He was told it might take a month before it works.  He states he is miserable waking up at night to urinate.  He states that the pain with urination also keeps him awake.    Past Medical History:  Diagnosis Date   Bell's palsy 12-10   Depression    Diabetes mellitus    type 2   Hyperlipidemia    Hypertension     Patient Active Problem List   Diagnosis Date Noted   Urinary incontinence 01/02/2024   Microalbuminuria due to type 2 diabetes mellitus (HCC) 10/01/2023   NSTEMI (non-ST elevated myocardial infarction) (HCC) 09/10/2022   Senile purpura (HCC) 06/28/2022   Tremor 08/24/2021   Alcoholic fatty liver 08/22/2021   Alcohol abuse 02/14/2021   Insomnia 03/04/2020   Pulmonary nodule, left 04/24/2019   Urinary hesitancy 05/16/2018   Nicotine dependence, uncomplicated 01/01/2016   Hydronephrosis with urinary obstruction due to ureteral calculus 01/01/2016   Benign non-nodular prostatic hyperplasia with lower urinary tract symptoms 01/01/2016   Left ureteral stone 01/01/2016   Left cervical radiculopathy 04/28/2015   Chondromalacia of right patellofemoral joint 04/28/2015   OSA on CPAP 06/26/2013   Severe episode of recurrent major depressive disorder (HCC) 04/21/2013   Generalized anxiety disorder 04/21/2013    Obesity 02/14/2012   RLS (restless legs syndrome) 03/12/2011   DERMATITIS, HANDS 08/04/2010   BELL'S PALSY 10/26/2009   CORTICAL SENILE CATARACT 05/09/2009   LUMBAR RADICULOPATHY 11/26/2008   Type 2 diabetes mellitus with periodontal complication (HCC) 03/13/2007   INCONTINENCE, URGE 03/13/2007   TOBACCO DEPENDENCE 08/20/2006   Essential hypertension, benign 08/20/2006   KIDNEY STONE - NEPHROLITHIASIS 08/20/2006   Sleep apnea 08/20/2006    Past Surgical History:  Procedure Laterality Date   CATARACT EXTRACTION Left    kidney stone removed     pfts     normal       Home Medications    Prior to Admission medications   Medication Sig Start Date End Date Taking? Authorizing Provider  sulfamethoxazole-trimethoprim (BACTRIM DS) 800-160 MG tablet Take 1 tablet by mouth 2 (two) times daily for 7 days. 02/03/24 02/10/24 Yes Eustace Moore, MD  tobramycin (TOBREX) 0.3 % ophthalmic solution Place 1 drop into both eyes every 4 (four) hours. 02/03/24  Yes Eustace Moore, MD  ALPRAZolam Prudy Feeler) 0.5 MG tablet Take 1 tablet (0.5 mg total) by mouth daily as needed. 01/22/24 05/21/24  Thresa Ross, MD  aspirin EC 81 MG tablet Take 81 mg by mouth daily. Swallow whole.    [provider]  DROPLET PEN NEEDLES 31G X 5 MM MISC USE FOR INJECTION DAILY 10/09/23   [name], MD  glucose  blood (ONE TOUCH TEST STRIPS) test strip Pt testing one time a day. 09/23/12   [name], MD  insulin glargine, 1 Unit Dial, (TOUJEO SOLOSTAR) 300 UNIT/ML Solostar Pen Inject 37-40 Units into the skin at bedtime. 10/01/23   [name], MD  JARDIANCE 25 MG TABS tablet Take 1 tablet (25 mg total) by mouth daily. Take 25 mg by mouth daily. 06/17/23   [name], MD  lisinopril (ZESTRIL) 10 MG tablet Take 1 tablet (10 mg total) by mouth daily. 10/01/23   [name], MD  metFORMIN (GLUCOPHAGE) 1000 MG tablet Take 1 tablet (1,000 mg total) by mouth 2 (two)  times daily with a meal. 01/02/24   [name], MD  metoprolol succinate (TOPROL-XL) 25 MG 24 hr tablet TAKE 1 TABLET BY MOUTH DAILY 01/03/24   [name], MD  Misc. Devices MISC New CPAP machine with mask and supplies through [facility].  CPAP therapy at 14 cm. water pressure. 11/21/18   [provider]  ONE TOUCH ULTRA TEST test strip TEST as directed 09/22/12   [name], MD  PARoxetine (PAXIL) 20 MG tablet TAKE 1 TABLET BY MOUTH DAILY 08/21/23   Thresa Ross, MD  rosuvastatin (CRESTOR) 10 MG tablet Take 1 tablet (10 mg total) by mouth daily. 01/02/24   [name], MD  tamsulosin (FLOMAX) 0.4 MG CAPS capsule Take 1 capsule (0.4 mg total) by mouth daily. 04/17/23   [name], MD    Family History Family History  Problem Relation Age of Onset   COPD Mother    Hypertension Mother    Alzheimer's disease Mother        ?   Diabetes Father    COPD Father    Drug abuse Son    COPD Sister    Cancer Sister        lung    Social History Social History   Tobacco Use   Smoking status: Every Day    Current packs/day: 0.00    Average packs/day: 1 pack/day for 25.0 years (25.0 ttl pk-yrs)    Types: Cigarettes    Start date: 02/11/1988    Last attempt to quit: 02/10/2013    Years since quitting: 10.9   Smokeless tobacco: Current    Types: Chew   Tobacco comments:    using electronic cigarettes  Vaping Use   Vaping status: Every Day  Substance Use Topics   Alcohol use: No    Alcohol/week: 0.0 standard drinks of alcohol   Drug use: No     Allergies   Patient has no known allergies.   Review of Systems Review of Systems See HPI  Physical Exam Triage Vital Signs ED Triage Vitals [02/03/24 1331]  Encounter Vitals Group     BP 127/78     Systolic BP Percentile      Diastolic BP Percentile      Pulse Rate (!) 103     Resp 17     Temp 98.6 F (37 C)     Temp Source Oral     SpO2 94 %     Weight       Height      Head Circumference      Peak Flow      Pain Score 0     Pain Loc      Pain Education      Exclude from Growth Chart    No data found.  Updated  Vital Signs BP 127/78 (BP Location: Right Arm)   Pulse (!) 103   Temp 98.6 F (37 C) (Oral)   Resp 17   SpO2 94%      Physical Exam Constitutional:      General: He is not in acute distress.    Appearance: He is well-developed. He is obese.  HENT:     Head: Normocephalic and atraumatic.  Eyes:     General: Vision grossly intact. Gaze aligned appropriately.        Right eye: Discharge present. No foreign body.        Left eye: Discharge present.No foreign body.     Conjunctiva/sclera:     Right eye: Right conjunctiva is injected.     Left eye: Left conjunctiva is injected.     Pupils: Pupils are equal, round, and reactive to light.     Comments: Both eyes are moderately injected.  Mild swelling of lids.  Copious yellow discharge in the medial canthus  Cardiovascular:     Rate and Rhythm: Normal rate.  Pulmonary:     Effort: Pulmonary effort is normal. No respiratory distress.  Abdominal:     General: There is no distension.     Palpations: Abdomen is soft.  Musculoskeletal:        General: Normal range of motion.     Cervical back: Normal range of motion.  Skin:    General: Skin is warm and dry.  Neurological:     Mental Status: He is alert.      UC Treatments / Results  Labs (all labs ordered are listed, but only abnormal results are displayed) Labs Reviewed - No data to display  EKG   Radiology No results found.  Procedures Procedures (including critical care time)  Medications Ordered in UC Medications - No data to display  Initial Impression / Assessment and Plan / UC Course  I have reviewed the triage vital signs and the nursing notes.  Pertinent labs & imaging results that were available during my care of the patient were reviewed by me and considered in my medical decision making (see  chart for details).     I am choosing Septra as an antibiotic because of bacterial coverage for MRSA.  Also because it is a good urinary antibiotic. He will be given Tobrex in addition for his pinkeye Final Clinical Impressions(s) / UC Diagnoses   Final diagnoses:  Bacterial conjunctivitis of both eyes  Dysuria     Discharge Instructions      Take the antibiotic 2 x a day Use eye drops every 4 h  while awake See your eye doctor if not improving in 48 hours   ED Prescriptions     Medication Sig Dispense Auth. Provider   sulfamethoxazole-trimethoprim (BACTRIM DS) 800-160 MG tablet Take 1 tablet by mouth 2 (two) times daily for 7 days. 14 tablet Eustace Moore, MD   tobramycin (TOBREX) 0.3 % ophthalmic solution Place 1 drop into both eyes every 4 (four) hours. 5 mL Eustace Moore, MD      PDMP not reviewed this encounter.   Eustace Moore, MD 02/03/24 [phone]

## 2024-02-03 NOTE — ED Triage Notes (Signed)
Pt c/o bilateral eye redness and drainage.

## 2024-02-13 DIAGNOSIS — H10023 Other mucopurulent conjunctivitis, bilateral: Secondary | ICD-10-CM | POA: Diagnosis not present

## 2024-02-20 DIAGNOSIS — H10023 Other mucopurulent conjunctivitis, bilateral: Secondary | ICD-10-CM | POA: Diagnosis not present

## 2024-02-20 DIAGNOSIS — Z961 Presence of intraocular lens: Secondary | ICD-10-CM | POA: Diagnosis not present

## 2024-02-24 ENCOUNTER — Telehealth (HOSPITAL_COMMUNITY): Payer: Self-pay | Admitting: *Deleted

## 2024-02-24 DIAGNOSIS — F411 Generalized anxiety disorder: Secondary | ICD-10-CM

## 2024-02-24 MED ORDER — ALPRAZOLAM 0.5 MG PO TABS: 0.5000 mg | ORAL_TABLET | Freq: Every day | ORAL | 2 refills | Status: DC | PRN

## 2024-02-24 NOTE — Telephone Encounter (Signed)
 Pt Request  ALPRAZolam (XANAX) 0.5 MG tablet   [facility] [id number] - [place], [place] - [address]   NEXT APPT -- 05/01/24 LAST APPT -- 11/01/23

## 2024-02-24 NOTE — Addendum Note (Signed)
 Addended by: Wray Heady on: 02/24/2024 10:01 AM   Modules accepted: Orders

## 2024-02-27 ENCOUNTER — Ambulatory Visit (INDEPENDENT_AMBULATORY_CARE_PROVIDER_SITE_OTHER)

## 2024-02-27 VITALS — Ht 69.0 in | Wt 195.0 lb

## 2024-02-27 DIAGNOSIS — Z0000 Encounter for general adult medical examination without abnormal findings: Secondary | ICD-10-CM | POA: Diagnosis not present

## 2024-02-27 NOTE — Patient Instructions (Signed)
  Gabriel Gilbert , Thank you for taking time to come for your Medicare Wellness Visit. I appreciate your ongoing commitment to your health goals. Please review the following plan we discussed and let me know if I can assist you in the future.   These are the goals we discussed:  Goals       Medication Management      Patient Goals/Self-Care Activities Over the next 90 days, patient will:  take medications as prescribed and check glucose daily, document, and provide at future appointments  Follow Up Plan: The patient has been provided with contact information for the care management team and has been advised to call with any health related questions or concerns.       Patient Stated (pt-stated)      02/19/2023 AWV Goal: Diabetes Management  Patient will maintain an A1C level below 8.0 Patient will not develop any diabetic foot complications Patient will not experience any hypoglycemic episodes over the next 3 months Patient will notify our office of any CBG readings outside of the provider recommended range by calling [phone] Patient will adhere to provider recommendations for diabetes management  Patient Self Management Activities take all medications as prescribed and report any negative side effects monitor and record blood sugar readings as directed adhere to a low carbohydrate diet that incorporates lean proteins, vegetables, whole grains, low glycemic fruits check feet daily noting any sores, cracks, injuries, or callous formations see PCP or podiatrist if he notices any changes in his legs, feet, or toenails Patient will visit PCP and have an A1C level checked every 3 to 6 months as directed  have a yearly eye exam to monitor for vascular changes associated with diabetes and will request that the report be sent to his pcp.  consult with his PCP regarding any changes in his health or new or worsening symptoms       Patient Stated      He would like to lose weight.          This is a list of the screening recommended for you and due dates:  Health Maintenance  Topic Date Due   Screening for Lung Cancer  Never done   Eye exam for diabetics  07/11/2012   Colon Cancer Screening  04/25/2016   COVID-19 Vaccine (4 - 2024-25 season) 07/14/2023   Complete foot exam   02/05/2024   Zoster (Shingles) Vaccine (1 of 2) 05/07/2024*   Flu Shot  06/12/2024   Hemoglobin A1C  07/01/2024   Yearly kidney health urinalysis for diabetes  09/30/2024   Yearly kidney function blood test for diabetes  01/01/2025   Medicare Annual Wellness Visit  02/26/2025   DTaP/Tdap/Td vaccine (3 - Td or Tdap) 04/27/2026   Pneumonia Vaccine  Completed   Hepatitis C Screening  Completed   HPV Vaccine  Aged Out   Meningitis B Vaccine  Aged Out  *Topic was postponed. The date shown is not the original due date.

## 2024-02-27 NOTE — Progress Notes (Signed)
 Subjective:   Gabriel Gilbert is a 73 y.o. male who presents for Medicare Annual/Subsequent preventive examination.  Visit Complete: Virtual I connected with  Gabriel Gilbert on 02/27/24 by a audio enabled telemedicine application and verified that I am speaking with the correct person using two identifiers.  Patient Location: Home  Provider Location: Office/Clinic  I discussed the limitations of evaluation and management by telemedicine. The patient expressed understanding and agreed to proceed.  Vital Signs: Because this visit was a virtual/telehealth visit, some criteria may be missing or patient reported. Any vitals not documented were not able to be obtained and vitals that have been documented are patient reported.  Patient Medicare AWV questionnaire was completed by the patient on n/a; I have confirmed that all information answered by patient is correct and no changes since this date.  Cardiac Risk Factors include: advanced age (>27men, >5 women);male gender;smoking/ tobacco exposure;sedentary lifestyle;diabetes mellitus;dyslipidemia;hypertension     Objective:    Today's Vitals   02/27/24 1318  Weight: 195 lb (88.5 kg)  Height: 5\' 9"  (1.753 m)   Body mass index is 28.8 kg/m.     02/27/2024    1:26 PM 02/19/2023    2:13 PM 02/18/2020    2:04 PM 05/19/2015    3:12 PM 03/30/2014    2:11 PM  Advanced Directives  Does Patient Have a Medical Advance Directive? No No No No Patient does not have advance directive;Patient would not like information  Would patient like information on creating a medical advance directive? No - Patient declined No - Patient declined No - Patient declined No - patient declined information     Current Medications (verified) Outpatient Encounter Medications as of 02/27/2024  Medication Sig   ALPRAZolam (XANAX) 0.5 MG tablet Take 1 tablet (0.5 mg total) by mouth daily as needed.   aspirin EC 81 MG tablet Take 81 mg by mouth daily. Swallow whole.    DROPLET PEN NEEDLES 31G X 5 MM MISC USE FOR INJECTION DAILY   glucose blood (ONE TOUCH TEST STRIPS) test strip Pt testing one time a day.   insulin glargine, 1 Unit Dial, (TOUJEO SOLOSTAR) 300 UNIT/ML Solostar Pen Inject 37-40 Units into the skin at bedtime.   JARDIANCE 25 MG TABS tablet Take 1 tablet (25 mg total) by mouth daily. Take 25 mg by mouth daily.   lisinopril (ZESTRIL) 10 MG tablet Take 1 tablet (10 mg total) by mouth daily.   metFORMIN (GLUCOPHAGE) 1000 MG tablet Take 1 tablet (1,000 mg total) by mouth 2 (two) times daily with a meal.   metoprolol succinate (TOPROL-XL) 25 MG 24 hr tablet TAKE 1 TABLET BY MOUTH DAILY   Misc. Devices MISC New CPAP machine with mask and supplies through [facility].  CPAP therapy at 14 cm. water pressure.   ONE TOUCH ULTRA TEST test strip TEST as directed   PARoxetine (PAXIL) 20 MG tablet TAKE 1 TABLET BY MOUTH DAILY   rosuvastatin (CRESTOR) 10 MG tablet Take 1 tablet (10 mg total) by mouth daily.   tobramycin (TOBREX) 0.3 % ophthalmic solution Place 1 drop into both eyes every 4 (four) hours.   tamsulosin (FLOMAX) 0.4 MG CAPS capsule Take 1 capsule (0.4 mg total) by mouth daily. (Patient not taking: Reported on 02/27/2024)   No facility-administered encounter medications on file as of 02/27/2024.    Allergies (verified) Patient has no known allergies.   History: Past Medical History:  Diagnosis Date   Bell's palsy 12-10   Depression  Diabetes mellitus    type 2   Hyperlipidemia    Hypertension    Past Surgical History:  Procedure Laterality Date   CATARACT EXTRACTION Left    kidney stone removed     pfts     normal   Family History  Problem Relation Age of Onset   COPD Mother    Hypertension Mother    Alzheimer's disease Mother        ?   Diabetes Father    COPD Father    Drug abuse Son    COPD Sister    Cancer Sister        lung   Social History   Socioeconomic History   Marital status: Divorced    Spouse name:  Not on file   Number of children: 1   Years of education: 12   Highest education level: 12th grade  Occupational History   Occupation: Retired  Tobacco Use   Smoking status: Former    Current packs/day: 0.00    Average packs/day: 1 pack/day for 25.0 years (25.0 ttl pk-yrs)    Types: Cigarettes    Start date: 02/11/1988    Quit date: 02/10/2013    Years since quitting: 11.0   Smokeless tobacco: Current    Types: Chew   Tobacco comments:    using electronic cigarettes  Vaping Use   Vaping status: Every Day  Substance and Sexual Activity   Alcohol use: No    Alcohol/week: 0.0 standard drinks of alcohol   Drug use: No   Sexual activity: Not Currently    Partners: Female  Other Topics Concern   Not on file  Social History Narrative   Lives alone. He has one child. He enjoys watching television.    Social Drivers of Corporate investment banker Strain: Low Risk  (02/27/2024)   Overall Financial Resource Strain (CARDIA)    Difficulty of Paying Living Expenses: Not hard at all  Food Insecurity: No Food Insecurity (02/27/2024)   Hunger Vital Sign    Worried About Running Out of Food in the Last Year: Never true    Ran Out of Food in the Last Year: Never true  Transportation Needs: No Transportation Needs (02/27/2024)   PRAPARE - Administrator, Civil Service (Medical): No    Lack of Transportation (Non-Medical): No  Physical Activity: Insufficiently Active (02/27/2024)   Exercise Vital Sign    Days of Exercise per Week: 7 days    Minutes of Exercise per Session: 10 min  Stress: No Stress Concern Present (02/27/2024)   [employer] of Occupational Health - Occupational Stress Questionnaire    Feeling of Stress : Not at all  Social Connections: Socially Isolated (02/27/2024)   Social Connection and Isolation Panel [NHANES]    Frequency of Communication with Friends and Family: More than three times a week    Frequency of Social Gatherings with Friends and Family: More  than three times a week    Attends Religious Services: Never    Database administrator or Organizations: No    Attends Engineer, structural: Never    Marital Status: Divorced    Tobacco Counseling Ready to quit: Not Answered Counseling given: Not Answered Tobacco comments: using electronic cigarettes   Clinical Intake:  Pre-visit preparation completed: Yes  Pain : No/denies pain     BMI - recorded: 28.8 Nutritional Status: BMI 25 -29 Overweight Nutritional Risks: None Diabetes: Yes CBG done?: No Did pt. bring in  CBG monitor from home?: No  How often do you need to have someone help you when you read instructions, pamphlets, or other written materials from your doctor or pharmacy?: 1 - Never What is the last grade level you completed in school?: 12  Interpreter Needed?: No      Activities of Daily Living    02/27/2024    1:20 PM  In your present state of health, do you have any difficulty performing the following activities:  Hearing? 0  Vision? 0  Difficulty concentrating or making decisions? 0  Walking or climbing stairs? 0  Dressing or bathing? 0  Doing errands, shopping? 0  Preparing Food and eating ? N  Using the Toilet? N  In the past six months, have you accidently leaked urine? N  Do you have problems with loss of bowel control? N  Managing your Medications? N  Managing your Finances? N  Housekeeping or managing your Housekeeping? N    Patient Care Team: [name], MD as PCP - General (Family Medicine) Thresa Ross, MD as Consulting Physician (Psychiatry) Gabriel Carina, [facility] (Inactive) as Pharmacist (Pharmacist) Gabriel Carina, [facility] (Inactive) (Pharmacist)  Indicate any recent Medical Services you may have received from other than [facility] providers in the past year (date may be approximate).     Assessment:   This is a routine wellness examination for Gabriel Gilbert.  Hearing/Vision screen No results found.   Goals Addressed              This Visit's Progress    Patient Stated       He would like to lose weight.       Depression Screen    02/27/2024    1:25 PM 02/19/2023    2:13 PM 02/05/2023    4:01 PM 10/29/2022   10:57 AM 12/26/2021    2:26 PM 12/12/2021    2:07 PM 08/24/2021   11:22 AM  PHQ 2/9 Scores  PHQ - 2 Score 0 2 2 6  0    PHQ- 9 Score  2 11 13      Exception Documentation      Other- indicate reason in comment box Medical reason    Fall Risk    02/27/2024    1:26 PM 02/19/2023    2:13 PM 02/05/2023    4:00 PM 12/26/2021    2:26 PM 12/12/2021    2:07 PM  Fall Risk   Falls in the past year? 0 0 0 0 0  Number falls in past yr: 0 0 0 0 0  Injury with Fall? 0 0 0 0 0  Risk for fall due to : No Fall Risks No Fall Risks No Fall Risks No Fall Risks No Fall Risks  Follow up Falls evaluation completed Falls evaluation completed Falls evaluation completed Falls prevention discussed;Falls evaluation completed Falls evaluation completed    MEDICARE RISK AT HOME: Medicare Risk at Home Any stairs in or around the home?: No Home free of loose throw rugs in walkways, pet beds, electrical cords, etc?: Yes Adequate lighting in your home to reduce risk of falls?: Yes Life alert?: No Use of a cane, walker or w/c?: No Grab bars in the bathroom?: No Shower chair or bench in shower?: No Elevated toilet seat or a handicapped toilet?: No  TIMED UP AND GO:  Was the test performed?  No    Cognitive Function:        02/27/2024    1:27 PM 02/19/2023  2:19 PM  6CIT Screen  What Year? 0 points 0 points  What month? 3 points 0 points  What time? 0 points 0 points  Count back from 20 0 points 0 points  Months in reverse 4 points 4 points  Repeat phrase 0 points 0 points  Total Score 7 points 4 points    Immunizations Immunization History  Administered Date(s) Administered   Fluad Quad(high Dose 65+) 08/24/2019, 07/24/2021, 09/14/2022   Fluad Trivalent(High Dose 65+) 10/01/2023   H1N1 11/08/2008    Influenza Split 10/10/2011, 09/02/2012, 09/19/2020   Influenza Whole 08/19/2006, 08/25/2007, 09/16/2009, 08/04/2010   Influenza, High Dose Seasonal PF 08/12/2018   Influenza,inj,Quad PF,6+ Mos 08/12/2013, 10/07/2017   Moderna Sars-Covid-2 Vaccination 02/17/2020, 03/16/2020, 07/01/2021   Pneumococcal Conjugate-13 02/04/2018   Pneumococcal Polysaccharide-23 12/25/2007, 08/24/2019   Td 02/21/2006   Tdap 04/27/2016    TDAP status: Up to date  Flu Vaccine status: Up to date  Pneumococcal vaccine status: Up to date  Covid-19 vaccine status: Declined, Education has been provided regarding the importance of this vaccine but patient still declined. Advised may receive this vaccine at local pharmacy or Health Dept.or vaccine clinic. Aware to provide a copy of the vaccination record if obtained from local pharmacy or Health Dept. Verbalized acceptance and understanding.  Qualifies for Shingles Vaccine? Yes   Zostavax completed No   Shingrix Completed?: No.    Education has been provided regarding the importance of this vaccine. Patient has been advised to call insurance company to determine out of pocket expense if they have not yet received this vaccine. Advised may also receive vaccine at local pharmacy or Health Dept. Verbalized acceptance and understanding.  Screening Tests Health Maintenance  Topic Date Due   Lung Cancer Screening  Never done   OPHTHALMOLOGY EXAM  07/11/2012   Colonoscopy  04/25/2016   COVID-19 Vaccine (4 - 2024-25 season) 07/14/2023   FOOT EXAM  02/05/2024   Zoster Vaccines- Shingrix (1 of 2) 05/07/2024 (Originally 10/05/2002)   INFLUENZA VACCINE  06/12/2024   HEMOGLOBIN A1C  07/01/2024   Diabetic kidney evaluation - Urine ACR  09/30/2024   Diabetic kidney evaluation - eGFR measurement  01/01/2025   Medicare Annual Wellness (AWV)  02/26/2025   DTaP/Tdap/Td (3 - Td or Tdap) 04/27/2026   Pneumonia Vaccine 2+ Years old  Completed   Hepatitis C Screening  Completed    HPV VACCINES  Aged Out   Meningococcal B Vaccine  Aged Out    Health Maintenance  Health Maintenance Due  Topic Date Due   Lung Cancer Screening  Never done   OPHTHALMOLOGY EXAM  07/11/2012   Colonoscopy  04/25/2016   COVID-19 Vaccine (4 - 2024-25 season) 07/14/2023   FOOT EXAM  02/05/2024    Colorectal cancer screening: Type of screening: Colonoscopy. Completed 07/12/2011. Repeat every 10 years Patient declined referral.   Lung Cancer Screening: (Low Dose CT Chest recommended if Age 72-80 years, 20 pack-year currently smoking OR have quit w/in 15years.) does qualify.   Lung Cancer Screening Referral: In the program with [facility]  Additional Screening:  Hepatitis C Screening: does qualify; Completed 08/25/2019  Vision Screening: Recommended annual ophthalmology exams for early detection of glaucoma and other disorders of the eye. Is the patient up to date with their annual eye exam?  No  Who is the provider or what is the name of the office in which the patient attends annual eye exams?  If pt is not established with a provider, would they like to  be referred to a provider to establish care? No .   Dental Screening: Recommended annual dental exams for proper oral hygiene  Diabetic Foot Exam: Diabetic Foot Exam: Overdue, Pt has been advised about the importance in completing this exam. Pt is scheduled for diabetic foot exam on 03/31/2024.  Community Resource Referral / Chronic Care Management: CRR required this visit?  No   CCM required this visit?  No     Plan:     I have personally reviewed and noted the following in the patient's chart:   Medical and social history Use of alcohol, tobacco or illicit drugs  Current medications and supplements including opioid prescriptions. Patient is not currently taking opioid prescriptions. Functional ability and status Nutritional status Physical activity Advanced directives List of other physicians Hospitalizations,  surgeries, and ER visits in previous 12 months Vitals Screenings to include cognitive, depression, and falls Referrals and appointments  In addition, I have reviewed and discussed with patient certain preventive protocols, quality metrics, and best practice recommendations. A written personalized care plan for preventive services as well as general preventive health recommendations were provided to patient.     Gabriel Gilbert, CMA   02/27/2024   After Visit Summary: (MyChart) Due to this being a telephonic visit, the after visit summary with patients personalized plan was offered to patient via MyChart   Nurse Notes:   Gabriel Gilbert is a 73 y.o. male patient of Metheney, Corita Diego, MD who had a Medicare Annual Wellness Visit today via telephone. Gabriel Gilbert is Retired and lives alone. he has 1 child. He reports that he is socially active and does interact with friends/family regularly. He is minimally physically active and enjoys watching television.

## 2024-03-23 ENCOUNTER — Other Ambulatory Visit: Payer: Self-pay | Admitting: Psychiatry

## 2024-03-23 ENCOUNTER — Telehealth (HOSPITAL_COMMUNITY): Payer: Self-pay | Admitting: *Deleted

## 2024-03-23 NOTE — Telephone Encounter (Signed)
 According to the database- she should have refills at the pharmacy.

## 2024-03-23 NOTE — Telephone Encounter (Signed)
 Pt Request  ALPRAZolam (XANAX) 0.5 MG tablet   [facility] [id number] - [place], [place] - [address]   NEXT APPT -- 05/01/24 LAST APPT -- 11/01/23

## 2024-03-24 DIAGNOSIS — E119 Type 2 diabetes mellitus without complications: Secondary | ICD-10-CM | POA: Diagnosis not present

## 2024-03-24 DIAGNOSIS — Z961 Presence of intraocular lens: Secondary | ICD-10-CM | POA: Diagnosis not present

## 2024-03-24 DIAGNOSIS — H26491 Other secondary cataract, right eye: Secondary | ICD-10-CM | POA: Diagnosis not present

## 2024-03-24 DIAGNOSIS — H26493 Other secondary cataract, bilateral: Secondary | ICD-10-CM | POA: Diagnosis not present

## 2024-03-24 DIAGNOSIS — H526 Other disorders of refraction: Secondary | ICD-10-CM | POA: Diagnosis not present

## 2024-03-24 NOTE — Telephone Encounter (Signed)
 Spoke with patient to inform ::: According to the database- he should have refills at the pharmacy.

## 2024-03-31 ENCOUNTER — Ambulatory Visit: Payer: Medicare HMO | Admitting: Family Medicine

## 2024-04-01 ENCOUNTER — Other Ambulatory Visit: Payer: Self-pay | Admitting: Family Medicine

## 2024-04-01 DIAGNOSIS — E11630 Type 2 diabetes mellitus with periodontal disease: Secondary | ICD-10-CM

## 2024-04-07 DIAGNOSIS — H26492 Other secondary cataract, left eye: Secondary | ICD-10-CM | POA: Diagnosis not present

## 2024-04-26 ENCOUNTER — Other Ambulatory Visit: Payer: Self-pay | Admitting: Family Medicine

## 2024-04-26 DIAGNOSIS — I10 Essential (primary) hypertension: Secondary | ICD-10-CM

## 2024-04-30 ENCOUNTER — Encounter: Payer: Self-pay | Admitting: Family Medicine

## 2024-04-30 ENCOUNTER — Ambulatory Visit (INDEPENDENT_AMBULATORY_CARE_PROVIDER_SITE_OTHER): Admitting: Family Medicine

## 2024-04-30 VITALS — BP 125/61 | HR 73 | Ht 69.0 in | Wt 191.1 lb

## 2024-04-30 DIAGNOSIS — D692 Other nonthrombocytopenic purpura: Secondary | ICD-10-CM | POA: Diagnosis not present

## 2024-04-30 DIAGNOSIS — I10 Essential (primary) hypertension: Secondary | ICD-10-CM | POA: Diagnosis not present

## 2024-04-30 DIAGNOSIS — Z794 Long term (current) use of insulin: Secondary | ICD-10-CM | POA: Diagnosis not present

## 2024-04-30 DIAGNOSIS — J029 Acute pharyngitis, unspecified: Secondary | ICD-10-CM

## 2024-04-30 DIAGNOSIS — L603 Nail dystrophy: Secondary | ICD-10-CM | POA: Diagnosis not present

## 2024-04-30 DIAGNOSIS — E11630 Type 2 diabetes mellitus with periodontal disease: Secondary | ICD-10-CM

## 2024-04-30 LAB — POCT GLYCOSYLATED HEMOGLOBIN (HGB A1C): Hemoglobin A1C: 7.2 % — AB (ref 4.0–5.6)

## 2024-04-30 MED ORDER — TIRZEPATIDE 5 MG/0.5ML ~~LOC~~ SOAJ: 5.0000 mg | SUBCUTANEOUS | 0 refills | Status: AC

## 2024-04-30 MED ORDER — TIRZEPATIDE 2.5 MG/0.5ML ~~LOC~~ SOAJ: 2.5000 mg | SUBCUTANEOUS | 0 refills | Status: DC

## 2024-04-30 NOTE — Progress Notes (Signed)
 Established Patient Office Visit  Subjective  Patient ID: Gabriel Gilbert, male    DOB: 1951/09/23  Age: 73 y.o. MRN: [id number]  No chief complaint on file.   HPI  Diabetes - no hypoglycemic events. No wounds or sores that are not healing well. No increased thirst or urination.  He does not check his blood sugars at home.. Taking medications as prescribed without any side effects.  He is on Jardiance , metformin  and 40 units of Toujeo .  He says he is also had a sore throat for about 3 months.  No cold or respiratory symptoms.  It did not start with a cold either.  He says he is not having any problems swallowing he describes the pain is mild.  He denies any excess postnasal drip he denies any recent heartburn type symptoms.  He says he does vape but does not have nicotine  in the vape.    ROS    Objective:     BP 125/61   Pulse 73   Ht 5' 9 (1.753 m)   Wt 191 lb 1.3 oz (86.7 kg)   SpO2 93%   BMI 28.22 kg/m    Physical Exam Constitutional:      Appearance: Normal appearance.  HENT:     Head: Normocephalic and atraumatic.     Right Ear: Tympanic membrane, ear canal and external ear normal. There is no impacted cerumen.     Left Ear: Tympanic membrane, ear canal and external ear normal. There is no impacted cerumen.     Nose: Nose normal.     Mouth/Throat:     Pharynx: Oropharynx is clear.   Eyes:     Conjunctiva/sclera: Conjunctivae normal.    Cardiovascular:     Rate and Rhythm: Normal rate and regular rhythm.  Pulmonary:     Effort: Pulmonary effort is normal.     Breath sounds: Normal breath sounds.   Musculoskeletal:     Cervical back: Neck supple. No tenderness.  Lymphadenopathy:     Cervical: No cervical adenopathy.   Skin:    General: Skin is warm and dry.   Neurological:     Mental Status: He is alert and oriented to person, place, and time.   Psychiatric:        Mood and Affect: Mood normal.      Results for orders placed or performed in  visit on 04/30/24  POCT HgB A1C  Result Value Ref Range   Hemoglobin A1C 7.2 (A) 4.0 - 5.6 %   HbA1c POC (<> result, manual entry)     HbA1c, POC (prediabetic range)     HbA1c, POC (controlled diabetic range)        The ASCVD Risk score (Arnett DK, et al., 2019) failed to calculate for the following reasons:   Risk score cannot be calculated because patient has a medical history suggesting prior/existing ASCVD    Assessment & Plan:   Problem List Items Addressed This Visit       Cardiovascular and Mediastinum   Senile purpura (HCC)   He has a lot of cuts and easy bruising on his arm he says he just barely touches it and it bleeds.  Gave him reassurance.  He also takes a daily aspirin because of his prior heart attack history.      Essential hypertension, benign - Primary   Well controlled. Continue current regimen. Follow up in  4 months.         Digestive   Type  2 diabetes mellitus with periodontal complication (HCC)   A1c still elevated and uncontrolled at 7.2.  Currently on Jardiance  25 mg, metformin  1000 mg twice a day and Toujeo . He doesn't check his sugars.  I am a little hesitant to adjust his insulin  up since he really does not check his sugars.  Add like to see if we might be able to get a GLP-1 covered if he does really well on it we might even be able to discontinue the Jardiance  and maybe even the metformin  and maybe come down on the insulin .  Will see if they will cover it we will apply for prior authorization.  Otherwise continue to work on healthy choices.  He says he really feels like there is not very many vegetables that he is willing to eat or likes.      Relevant Medications   tirzepatide (MOUNJARO) 2.5 MG/0.5ML Pen   tirzepatide Florence Hunt) 5 MG/0.5ML Pen (Start on 05/30/2024)   Other Relevant Orders   POCT HgB A1C (Completed)   Ambulatory referral to Podiatry   Other Visit Diagnoses       Nail dystrophy       Relevant Orders   Ambulatory referral to  Podiatry     Sore throat       Relevant Orders   Culture, Group A Strep   Ambulatory referral to ENT       Sore throat x 3 months-unclear etiology and can go ahead and do a strep culture today he is a former tobacco user he denies any recent postnasal drip or GERD no difficulty swallowing.  If strep culture is normal then recommend referral to ENT for further evaluation.  He is at risk for throat cancer with history of smoking  Will refer to podiatry for nail care.  Declined colon cancer screening today.    Diabetic Foot Exam - Simple   No data filed      Return in about 4 months (around 08/30/2024) for Diabetes follow-up, Hypertension.    Duaine German, MD

## 2024-04-30 NOTE — Assessment & Plan Note (Signed)
Well controlled. Continue current regimen. Follow up in  4 months.   

## 2024-04-30 NOTE — Assessment & Plan Note (Addendum)
 A1c still elevated and uncontrolled at 7.2.  Currently on Jardiance  25 mg, metformin  1000 mg twice a day and Toujeo . He doesn't check his sugars.  I am a little hesitant to adjust his insulin  up since he really does not check his sugars.  Add like to see if we might be able to get a GLP-1 covered if he does really well on it we might even be able to discontinue the Jardiance  and maybe even the metformin  and maybe come down on the insulin .  Will see if they will cover it we will apply for prior authorization.  Otherwise continue to work on healthy choices.  He says he really feels like there is not very many vegetables that he is willing to eat or likes.

## 2024-04-30 NOTE — Assessment & Plan Note (Addendum)
 He has a lot of cuts and easy bruising on his arm he says he just barely touches it and it bleeds.  Gave him reassurance.  He also takes a daily aspirin because of his prior heart attack history.

## 2024-05-01 ENCOUNTER — Telehealth (HOSPITAL_COMMUNITY): Payer: Medicare HMO | Admitting: Psychiatry

## 2024-05-01 ENCOUNTER — Encounter (HOSPITAL_COMMUNITY): Payer: Self-pay | Admitting: Psychiatry

## 2024-05-01 DIAGNOSIS — F411 Generalized anxiety disorder: Secondary | ICD-10-CM

## 2024-05-01 DIAGNOSIS — F1090 Alcohol use, unspecified, uncomplicated: Secondary | ICD-10-CM | POA: Diagnosis not present

## 2024-05-01 DIAGNOSIS — F331 Major depressive disorder, recurrent, moderate: Secondary | ICD-10-CM | POA: Diagnosis not present

## 2024-05-01 MED ORDER — PAROXETINE HCL 20 MG PO TABS: 20.0000 mg | ORAL_TABLET | Freq: Every day | ORAL | 1 refills | Status: DC

## 2024-05-01 NOTE — Progress Notes (Signed)
 [facility] Outpatient Follow up visit  Telepsych Patient Identification: Gabriel Gilbert MRN:  [id number] Date of Evaluation:  05/01/2024 Referral Source: primary care.  Chief Complaint:    anxiety Visit Diagnosis:    ICD-10-CM   1. GAD (generalized anxiety disorder)  F41.1     2. Major depressive disorder, recurrent episode, moderate (HCC)  F33.1     3. Alcohol use  F10.90      Virtual Visit via Video Note  I connected with Gabriel Gilbert on 05/01/24 at 10:00 AM EDT by a video enabled telemedicine application and verified that I am speaking with the correct person using two identifiers.  Location: Patient: home Provider: home office   I discussed the limitations of evaluation and management by telemedicine and the availability of in person appointments. The patient expressed understanding and agreed to proceed.     I discussed the assessment and treatment plan with the patient. The patient was provided an opportunity to ask questions and all were answered. The patient agreed with the plan and demonstrated an understanding of the instructions.   The patient was advised to call back or seek an in-person evaluation if the symptoms worsen or if the condition fails to improve as anticipated.  I provided 18 minutes of non-face-to-face time during this encounter.      HPI:  Chart reviewed, follows with pcp On evaluation doing fair but has not been taking his Paxil  regularly does not endorse hopelessness tries to keep busy he goes to [employer] on a regular daily basis he takes Xanax  that helps with anxiety Following with PCP and understand to follow-up in regarding to his diabetes control   Motivation is baseline  Aggravating factor: Loneliness  ,modifying factor: dog,  Some friends, goes to [employer].  Timing in morning   Past Psychiatric History: depression ,anxiety  Previous Psychotropic Medications: Yes   Substance Abuse History in the last 12 months:   No.  Consequences of Substance Abuse: NA  Past Medical History:  Past Medical History:  Diagnosis Date   Bell's palsy 12-10   Depression    Diabetes mellitus    type 2   Hyperlipidemia    Hypertension     Past Surgical History:  Procedure Laterality Date   CATARACT EXTRACTION Left    kidney stone removed     pfts     normal    Family Psychiatric History: anxiety. Mom used valium  Family History:  Family History  Problem Relation Age of Onset   COPD Mother    Hypertension Mother    Alzheimer's disease Mother        ?   Diabetes Father    COPD Father    Drug abuse Son    COPD Sister    Cancer Sister        lung    Social History:   Social History   Socioeconomic History   Marital status: Divorced    Spouse name: Not on file   Number of children: 1   Years of education: 12   Highest education level: 12th grade  Occupational History   Occupation: Retired  Tobacco Use   Smoking status: Former    Current packs/day: 0.00    Average packs/day: 1 pack/day for 25.0 years (25.0 ttl pk-yrs)    Types: Cigarettes    Start date: 02/11/1988    Quit date: 02/10/2013    Years since quitting: 11.2   Smokeless tobacco: Current    Types: Sports administrator  Tobacco comments:    using electronic cigarettes  Vaping Use   Vaping status: Every Day  Substance and Sexual Activity   Alcohol use: No    Alcohol/week: 0.0 standard drinks of alcohol   Drug use: No   Sexual activity: Not Currently    Partners: Female  Other Topics Concern   Not on file  Social History Narrative   Lives alone. He has one child. He enjoys watching television.    Social Drivers of Corporate investment banker Strain: Low Risk  (02/27/2024)   Overall Financial Resource Strain (CARDIA)    Difficulty of Paying Living Expenses: Not hard at all  Food Insecurity: No Food Insecurity (02/27/2024)   Hunger Vital Sign    Worried About Running Out of Food in the Last Year: Never true    Ran Out of Food in the Last  Year: Never true  Transportation Needs: No Transportation Needs (02/27/2024)   PRAPARE - Administrator, Civil Service (Medical): No    Lack of Transportation (Non-Medical): No  Physical Activity: Insufficiently Active (02/27/2024)   Exercise Vital Sign    Days of Exercise per Week: 7 days    Minutes of Exercise per Session: 10 min  Stress: No Stress Concern Present (02/27/2024)   [employer] of Occupational Health - Occupational Stress Questionnaire    Feeling of Stress : Not at all  Social Connections: Socially Isolated (02/27/2024)   Social Connection and Isolation Panel    Frequency of Communication with Friends and Family: More than three times a week    Frequency of Social Gatherings with Friends and Family: More than three times a week    Attends Religious Services: Never    Database administrator or Organizations: No    Attends Banker Meetings: Never    Marital Status: Divorced      Allergies:  No Known Allergies  Metabolic Disorder Labs: Lab Results  Component Value Date   HGBA1C 7.2 (A) 04/30/2024   No results found for: PROLACTIN Lab Results  Component Value Date   CHOL 154 06/17/2023   TRIG 185 (H) 06/17/2023   HDL 41 06/17/2023   CHOLHDL 3.8 06/17/2023   VLDL 22 01/17/2016   LDLCALC 82 06/17/2023   LDLCALC 72 12/27/2021     Current Medications: Current Outpatient Medications  Medication Sig Dispense Refill   ALPRAZolam  (XANAX ) 0.5 MG tablet Take 1 tablet (0.5 mg total) by mouth daily as needed. 30 tablet 2   aspirin EC 81 MG tablet Take 81 mg by mouth daily. Swallow whole.     DROPLET PEN NEEDLES 31G X 5 MM MISC USE FOR INJECTION DAILY 100 each 9   glucose blood (ONE TOUCH TEST STRIPS) test strip Pt testing one time a day. 100 each 3   JARDIANCE  25 MG TABS tablet Take 1 tablet (25 mg total) by mouth daily. Take 25 mg by mouth daily. 90 tablet 3   lisinopril  (ZESTRIL ) 10 MG tablet TAKE 1 TABLET BY MOUTH DAILY 90 tablet 1    metFORMIN  (GLUCOPHAGE ) 1000 MG tablet Take 1 tablet (1,000 mg total) by mouth 2 (two) times daily with a meal. 180 tablet 3   metoprolol  succinate (TOPROL -XL) 25 MG 24 hr tablet TAKE 1 TABLET BY MOUTH DAILY 90 tablet 1   Misc. Devices MISC New CPAP machine with mask and supplies through [facility].  CPAP therapy at 14 cm. water pressure.     ONE TOUCH ULTRA TEST test  strip TEST as directed 100 each 3   PARoxetine  (PAXIL ) 20 MG tablet Take 1 tablet (20 mg total) by mouth daily. 30 tablet 1   rosuvastatin  (CRESTOR ) 10 MG tablet Take 1 tablet (10 mg total) by mouth daily. 100 tablet 3   tirzepatide (MOUNJARO) 2.5 MG/0.5ML Pen Inject 2.5 mg into the skin once a week. 2 mL 0   [START ON 05/30/2024] tirzepatide (MOUNJARO) 5 MG/0.5ML Pen Inject 5 mg into the skin once a week. 2 mL 0   TOUJEO  SOLOSTAR 300 UNIT/ML Solostar Pen INJECT 37-40 UNITS INTO THE SKIN EVERY NIGHT AT BEDTIME. 9 mL 1   No current facility-administered medications for this visit.      Psychiatric Specialty Exam: Review of Systems  Cardiovascular:  Negative for chest pain and palpitations.  Psychiatric/Behavioral:  Negative for suicidal ideas.     There were no vitals taken for this visit.There is no height or weight on file to calculate BMI.  General Appearance: casual  Eye Contact:  fair  Speech:  Slow  Volume:  Decreased  Mood:fair  Affect:   Thought Process:  Goal Directed  Orientation:  Full (Time, Place, and Person)  Thought Content:  Rumination  Suicidal Thoughts:  No  Homicidal Thoughts:  No  Memory:  Immediate;   Fair Recent;   Fair  Judgement:  Poor  Insight:  Shallow  Psychomotor Activity:  Decreased  Concentration:  Concentration: Fair and Attention Span: Fair  Recall:  [employer] of Knowledge:Fair  Language: Fair  Akathisia:  Negative  Handed:  Right  AIMS (if indicated):  0  Assets:  Desire for Improvement  ADL's:  Intact  Cognition: WNL  Sleep:  fair    Treatment Plan  Summary: Medication management and Plan as follows  Prior documentation reviewed  1. Major depression moderate recurrent: Somewhat subdued discussed compliance with Paxil  he agrees to that and will be taking regularly renewed the prescription 2.GAD: Fluctuates but Xanax  does help discussed risk and side effects continue Paxil  as well   3. Alcohol use: He denies use discussed abstinence and relapse prevention  Reviewed medication questions addressed follow-up in 6 months or earlier if needed.    Wray Heady, MD 6/20/20259:55 AM

## 2024-05-03 LAB — CULTURE, GROUP A STREP: Strep A Culture: NEGATIVE

## 2024-05-04 ENCOUNTER — Ambulatory Visit: Payer: Self-pay | Admitting: Family Medicine

## 2024-05-04 NOTE — Progress Notes (Signed)
Negative strep culture.

## 2024-05-21 ENCOUNTER — Ambulatory Visit: Admitting: Podiatry

## 2024-05-21 ENCOUNTER — Encounter: Payer: Self-pay | Admitting: Podiatry

## 2024-05-21 DIAGNOSIS — M79674 Pain in right toe(s): Secondary | ICD-10-CM

## 2024-05-21 DIAGNOSIS — B351 Tinea unguium: Secondary | ICD-10-CM

## 2024-05-21 DIAGNOSIS — E1142 Type 2 diabetes mellitus with diabetic polyneuropathy: Secondary | ICD-10-CM | POA: Diagnosis not present

## 2024-05-21 DIAGNOSIS — M79675 Pain in left toe(s): Secondary | ICD-10-CM | POA: Diagnosis not present

## 2024-05-21 NOTE — Progress Notes (Signed)
  Subjective:  Patient ID: Gabriel Gilbert, male    DOB: 06/17/1951,   MRN: [id number]  Chief Complaint  Patient presents with   Diabetes    Cut my toenails, I guess.  Saw Dr. Alvan - 04/30/2024; A1c - 7.2    73 y.o. male presents for concern of thickened elongated and painful nails that are difficult to trim. Requesting to have them trimmed today.  Denies burning and tingling in their feet. Patient is diabetic and last A1c was  Lab Results  Component Value Date   HGBA1C 7.2 (A) 04/30/2024   .   PCP:  Alvan Dorothyann BIRCH, MD    . Denies any other pedal complaints. Denies n/v/f/c.   Past Medical History:  Diagnosis Date   Bell's palsy 12-10   Depression    Diabetes mellitus    type 2   Hyperlipidemia    Hypertension     Objective:  Physical Exam: Vascular: DP/PT pulses 1/4 bilateral. CFT <3 seconds. Absent hair growth on digits and legs. Edema noted to bilateral lower extremities. Xerosis noted bilaterally.  Skin. No lacerations or abrasions bilateral feet. Nails 1-5 bilateral  are thickened discolored and elongated with subungual debris.  Musculoskeletal: MMT 5/5 bilateral lower extremities in DF, PF, Inversion and Eversion. Deceased ROM in DF of ankle joint.  Neurological: Sensation intact to light touch. Protective sensation diminished bilateral.    Assessment:   1. Pain due to onychomycosis of toenails of both feet   2. Type 2 diabetes mellitus with peripheral neuropathy (HCC)      Plan:  Patient was evaluated and treated and all questions answered. -Discussed and educated patient on diabetic foot care, especially with  regards to the vascular, neurological and musculoskeletal systems.  -Stressed the importance of good glycemic control and the detriment of not  controlling glucose levels in relation to the foot. -Discussed supportive shoes at all times and checking feet regularly.  -Mechanically debrided all nails 1-5 bilateral using sterile nail nipper and  filed with dremel without incident  -ABIs ordered.  -Answered all patient questions -Patient to return  in 3 months for at risk foot care -Patient advised to call the office if any problems or questions arise in the meantime.   Asberry Failing, DPM

## 2024-06-23 ENCOUNTER — Telehealth (HOSPITAL_COMMUNITY): Payer: Self-pay | Admitting: Psychiatry

## 2024-06-23 DIAGNOSIS — F411 Generalized anxiety disorder: Secondary | ICD-10-CM

## 2024-06-23 MED ORDER — ALPRAZOLAM 0.5 MG PO TABS
0.5000 mg | ORAL_TABLET | Freq: Every day | ORAL | 2 refills | Status: DC | PRN
Start: 2024-06-23 — End: 2024-09-23

## 2024-06-23 NOTE — Telephone Encounter (Signed)
 Patient calling. He needs Xanax  refilled  Arloa Prior in [place]  Next Visit -12/19 Last Visit -6/20

## 2024-07-15 ENCOUNTER — Ambulatory Visit: Payer: Self-pay

## 2024-07-15 NOTE — Telephone Encounter (Signed)
 Patient scheduled.

## 2024-07-15 NOTE — Telephone Encounter (Signed)
 Patient advised that the recommendation is to be evaluated in the next 4 hours.  Urgent Care or Emergency Room are advised due to no availability in PCP office and patient refused.  FYI Only or Action Required?: Action required by provider: update on patient condition.  Patient was last seen in primary care on 04/30/2024 by Gabriel Dorothyann BIRCH, MD.  Called Nurse Triage reporting Shortness of Breath.  Symptoms began a week ago.  Interventions attempted: Rest, hydration, or home remedies.  Symptoms are: gradually worsening.  Triage Disposition: See HCP Within 4 Hours (Or PCP Triage)  Patient/caregiver understands and will follow disposition?: No, refuses disposition                     Copied from CRM #[id number]. Topic: Clinical - Red Word Triage >> Jul 15, 2024  2:15 PM Gabriel Gilbert wrote: Red Word that prompted transfer to Nurse Triage: Patient having difficulty breathing for a week now. Reason for Disposition  [1] MILD difficulty breathing (e.g., minimal/no SOB at rest, SOB with walking, pulse < 100) AND [2] NEW-onset or WORSE than normal  Answer Assessment - Initial Assessment Questions 1. RESPIRATORY STATUS: Describe your breathing? (e.g., wheezing, shortness of breath, unable to speak, severe coughing)      Shortness of breath 2. ONSET: When did this breathing problem begin?      X 1 week 3. PATTERN Does the difficult breathing come and go, or has it been constant since it started?      Off and on    worse on exertion 4. SEVERITY: How bad is your breathing? (e.g., mild, moderate, severe)      Labored 5. RECURRENT SYMPTOM: Have you had difficulty breathing before? If Yes, ask: When was the last time? and What happened that time?      No 6. CARDIAC HISTORY: Do you have any history of heart disease? (e.g., heart attack, angina, bypass surgery, angioplasty)      Heart attack 7. LUNG HISTORY: Do you have any history of lung disease?  (e.g.,  pulmonary embolus, asthma, emphysema)     Patient denies 8. CAUSE: What do you think is causing the breathing problem?      unknown 9. OTHER SYMPTOMS: Do you have any other symptoms? (e.g., chest pain, cough, dizziness, fever, runny nose)     Patient denies any other symptoms 10. O2 SATURATION MONITOR:  Do you use an oxygen saturation monitor (pulse oximeter) at home? If Yes, ask: What is your reading (oxygen level) today? What is your usual oxygen saturation reading? (e.g., 95%)       87% this morning        91% during triage at 2:23 PM  07/15/2024  Protocols used: Breathing Difficulty-A-AH

## 2024-07-15 NOTE — Telephone Encounter (Signed)
 Called [facility] to inform them of patient's symptoms and refusal of disposition

## 2024-07-16 ENCOUNTER — Encounter: Payer: Self-pay | Admitting: Medical-Surgical

## 2024-07-16 ENCOUNTER — Ambulatory Visit (INDEPENDENT_AMBULATORY_CARE_PROVIDER_SITE_OTHER): Admitting: Medical-Surgical

## 2024-07-16 VITALS — BP 129/68 | HR 72 | Resp 20 | Ht 69.0 in | Wt 191.0 lb

## 2024-07-16 DIAGNOSIS — R079 Chest pain, unspecified: Secondary | ICD-10-CM | POA: Diagnosis not present

## 2024-07-16 DIAGNOSIS — R0902 Hypoxemia: Secondary | ICD-10-CM | POA: Diagnosis not present

## 2024-07-16 DIAGNOSIS — J40 Bronchitis, not specified as acute or chronic: Secondary | ICD-10-CM | POA: Diagnosis not present

## 2024-07-16 DIAGNOSIS — Z7951 Long term (current) use of inhaled steroids: Secondary | ICD-10-CM | POA: Diagnosis not present

## 2024-07-16 DIAGNOSIS — J209 Acute bronchitis, unspecified: Secondary | ICD-10-CM | POA: Diagnosis not present

## 2024-07-16 DIAGNOSIS — R0609 Other forms of dyspnea: Secondary | ICD-10-CM

## 2024-07-16 DIAGNOSIS — R0602 Shortness of breath: Secondary | ICD-10-CM | POA: Diagnosis not present

## 2024-07-16 DIAGNOSIS — F17290 Nicotine dependence, other tobacco product, uncomplicated: Secondary | ICD-10-CM | POA: Diagnosis not present

## 2024-07-16 NOTE — Patient Instructions (Signed)
 Please go straight to the Emergency Room for further evaluation of the shortness of breath. Your lungs sound congested and your oxygen is low without the use of an oxygen tank. I am concerned for pneumonia.

## 2024-07-16 NOTE — Progress Notes (Signed)
        Established patient visit   History of Present Illness   Discussed the use of AI scribe software for clinical note transcription with the patient, who gave verbal consent to proceed.  History of Present Illness   Gabriel Gilbert is a 73 year old male with a history of myocardial infarction who presents with shortness of breath on exertion.  Dyspnea - Shortness of breath for the past week, companion reports more like a month - Worsens with exertion - No chest pain - No peripheral edema  Cough - Occasional non-productive cough  Tobacco and inhalant exposure - Quit smoking two years ago - Currently uses a vape with flavored substances that do not contain nicotine  or THC  Recent activity and illness exposure - No recent changes in activities - No exposure to illness  Cardiac history - History of myocardial infarction       Physical Exam   Physical Exam Vitals and nursing note reviewed.  Constitutional:      General: He is not in acute distress.    Appearance: Normal appearance. He is well-developed. He is ill-appearing.  HENT:     Head: Normocephalic and atraumatic.  Cardiovascular:     Rate and Rhythm: Normal rate and regular rhythm.     Pulses: Normal pulses.     Heart sounds: Normal heart sounds. No murmur heard.    No friction rub. No gallop.  Pulmonary:     Effort: Tachypnea and respiratory distress present.     Breath sounds: Examination of the left-upper field reveals rhonchi. Examination of the right-lower field reveals wheezing and rhonchi. Examination of the left-lower field reveals wheezing and rhonchi. Wheezing and rhonchi present.  Skin:    General: Skin is warm and dry.  Neurological:     Mental Status: He is alert and oriented to person, place, and time.  Psychiatric:        Mood and Affect: Mood normal.        Behavior: Behavior normal.        Thought Content: Thought content normal.        Judgment: Judgment normal.    Assessment & Plan    Assessment and Plan    Acute shortness of breath with wheezing, possible pneumonia Acute dyspnea with exertion, worsening over a week. Former smoker, current vaper. Oxygen saturation 87% on RA on arrival. Supplemental O2 at 2L via [place], sats up to 94-96%. Rhonchi and wheezing noted.  - Conferred with PCP regarding findings.  - With hypoxia, tachypnea, and concern for possible pneumonia, referred to ED.     Follow up   Return if symptoms worsen or fail to improve. __________________________________ Zada FREDRIK Palin, DNP, APRN, FNP-BC Primary Care and Sports Medicine [facility]

## 2024-07-20 ENCOUNTER — Other Ambulatory Visit: Payer: Self-pay | Admitting: Family Medicine

## 2024-07-23 ENCOUNTER — Telehealth: Payer: Self-pay

## 2024-07-23 NOTE — Telephone Encounter (Signed)
 [facility] declined Colon Cancer Screening.

## 2024-07-31 DIAGNOSIS — E119 Type 2 diabetes mellitus without complications: Secondary | ICD-10-CM | POA: Diagnosis not present

## 2024-07-31 DIAGNOSIS — J9601 Acute respiratory failure with hypoxia: Secondary | ICD-10-CM | POA: Diagnosis not present

## 2024-07-31 DIAGNOSIS — J209 Acute bronchitis, unspecified: Secondary | ICD-10-CM | POA: Diagnosis not present

## 2024-07-31 DIAGNOSIS — W19XXXA Unspecified fall, initial encounter: Secondary | ICD-10-CM | POA: Diagnosis not present

## 2024-07-31 DIAGNOSIS — R0602 Shortness of breath: Secondary | ICD-10-CM | POA: Diagnosis not present

## 2024-07-31 DIAGNOSIS — E785 Hyperlipidemia, unspecified: Secondary | ICD-10-CM | POA: Diagnosis not present

## 2024-07-31 DIAGNOSIS — J208 Acute bronchitis due to other specified organisms: Secondary | ICD-10-CM | POA: Diagnosis not present

## 2024-07-31 DIAGNOSIS — E1142 Type 2 diabetes mellitus with diabetic polyneuropathy: Secondary | ICD-10-CM | POA: Diagnosis not present

## 2024-07-31 DIAGNOSIS — I2510 Atherosclerotic heart disease of native coronary artery without angina pectoris: Secondary | ICD-10-CM | POA: Diagnosis not present

## 2024-07-31 DIAGNOSIS — Z794 Long term (current) use of insulin: Secondary | ICD-10-CM | POA: Diagnosis not present

## 2024-07-31 DIAGNOSIS — R0902 Hypoxemia: Secondary | ICD-10-CM | POA: Diagnosis not present

## 2024-07-31 DIAGNOSIS — E8721 Acute metabolic acidosis: Secondary | ICD-10-CM | POA: Diagnosis not present

## 2024-07-31 DIAGNOSIS — F419 Anxiety disorder, unspecified: Secondary | ICD-10-CM | POA: Diagnosis not present

## 2024-07-31 DIAGNOSIS — F32A Depression, unspecified: Secondary | ICD-10-CM | POA: Diagnosis not present

## 2024-07-31 DIAGNOSIS — I10 Essential (primary) hypertension: Secondary | ICD-10-CM | POA: Diagnosis not present

## 2024-07-31 DIAGNOSIS — N400 Enlarged prostate without lower urinary tract symptoms: Secondary | ICD-10-CM | POA: Diagnosis not present

## 2024-07-31 DIAGNOSIS — R0789 Other chest pain: Secondary | ICD-10-CM | POA: Diagnosis not present

## 2024-07-31 DIAGNOSIS — J984 Other disorders of lung: Secondary | ICD-10-CM | POA: Diagnosis not present

## 2024-07-31 DIAGNOSIS — J219 Acute bronchiolitis, unspecified: Secondary | ICD-10-CM | POA: Diagnosis not present

## 2024-07-31 DIAGNOSIS — R1011 Right upper quadrant pain: Secondary | ICD-10-CM | POA: Diagnosis not present

## 2024-07-31 DIAGNOSIS — I071 Rheumatic tricuspid insufficiency: Secondary | ICD-10-CM | POA: Diagnosis not present

## 2024-08-01 DIAGNOSIS — I10 Essential (primary) hypertension: Secondary | ICD-10-CM | POA: Diagnosis not present

## 2024-08-01 DIAGNOSIS — E119 Type 2 diabetes mellitus without complications: Secondary | ICD-10-CM | POA: Diagnosis not present

## 2024-08-01 DIAGNOSIS — R0789 Other chest pain: Secondary | ICD-10-CM | POA: Diagnosis not present

## 2024-08-01 DIAGNOSIS — F419 Anxiety disorder, unspecified: Secondary | ICD-10-CM | POA: Diagnosis not present

## 2024-08-01 DIAGNOSIS — W19XXXA Unspecified fall, initial encounter: Secondary | ICD-10-CM | POA: Diagnosis not present

## 2024-08-01 DIAGNOSIS — N400 Enlarged prostate without lower urinary tract symptoms: Secondary | ICD-10-CM | POA: Diagnosis not present

## 2024-08-01 DIAGNOSIS — J9601 Acute respiratory failure with hypoxia: Secondary | ICD-10-CM | POA: Diagnosis not present

## 2024-08-01 DIAGNOSIS — E785 Hyperlipidemia, unspecified: Secondary | ICD-10-CM | POA: Diagnosis not present

## 2024-08-01 DIAGNOSIS — J208 Acute bronchitis due to other specified organisms: Secondary | ICD-10-CM | POA: Diagnosis not present

## 2024-08-02 DIAGNOSIS — I071 Rheumatic tricuspid insufficiency: Secondary | ICD-10-CM | POA: Diagnosis not present

## 2024-08-02 DIAGNOSIS — J9601 Acute respiratory failure with hypoxia: Secondary | ICD-10-CM | POA: Diagnosis not present

## 2024-08-03 DIAGNOSIS — J9601 Acute respiratory failure with hypoxia: Secondary | ICD-10-CM | POA: Diagnosis not present

## 2024-08-04 DIAGNOSIS — J9601 Acute respiratory failure with hypoxia: Secondary | ICD-10-CM | POA: Diagnosis not present

## 2024-08-05 DIAGNOSIS — J209 Acute bronchitis, unspecified: Secondary | ICD-10-CM | POA: Diagnosis not present

## 2024-08-05 DIAGNOSIS — Z794 Long term (current) use of insulin: Secondary | ICD-10-CM | POA: Diagnosis not present

## 2024-08-05 DIAGNOSIS — J9601 Acute respiratory failure with hypoxia: Secondary | ICD-10-CM | POA: Diagnosis not present

## 2024-08-05 DIAGNOSIS — E119 Type 2 diabetes mellitus without complications: Secondary | ICD-10-CM | POA: Diagnosis not present

## 2024-08-06 DIAGNOSIS — J209 Acute bronchitis, unspecified: Secondary | ICD-10-CM | POA: Diagnosis not present

## 2024-08-06 DIAGNOSIS — E119 Type 2 diabetes mellitus without complications: Secondary | ICD-10-CM | POA: Diagnosis not present

## 2024-08-06 DIAGNOSIS — J9601 Acute respiratory failure with hypoxia: Secondary | ICD-10-CM | POA: Diagnosis not present

## 2024-08-06 DIAGNOSIS — Z794 Long term (current) use of insulin: Secondary | ICD-10-CM | POA: Diagnosis not present

## 2024-08-07 ENCOUNTER — Telehealth: Payer: Self-pay

## 2024-08-07 ENCOUNTER — Ambulatory Visit (INDEPENDENT_AMBULATORY_CARE_PROVIDER_SITE_OTHER)

## 2024-08-07 ENCOUNTER — Telehealth: Payer: Self-pay | Admitting: Family Medicine

## 2024-08-07 ENCOUNTER — Ambulatory Visit
Admission: RE | Admit: 2024-08-07 | Discharge: 2024-08-07 | Disposition: A | Discharge status: Home / Self Care | Attending: Family Medicine | Admitting: Family Medicine

## 2024-08-07 ENCOUNTER — Ambulatory Visit: Payer: Self-pay

## 2024-08-07 VITALS — BP 122/92 | HR 86 | Temp 99.0°F | Resp 20

## 2024-08-07 DIAGNOSIS — R0781 Pleurodynia: Secondary | ICD-10-CM | POA: Diagnosis not present

## 2024-08-07 DIAGNOSIS — S20212A Contusion of left front wall of thorax, initial encounter: Secondary | ICD-10-CM

## 2024-08-07 MED ORDER — CELECOXIB 100 MG PO CAPS: ORAL_CAPSULE | ORAL | 1 refills | Status: DC

## 2024-08-07 NOTE — Telephone Encounter (Signed)
 FYI Only or Action Required?: Action required by provider: update on patient condition and request for hydrocodone  sent to office- advised pt needs to be seen prior- pt scheduled with UC.  Patient was last seen in primary care on 07/16/2024 by Willo Mini, NP.  Called Nurse Triage reporting Flank Pain.  Symptoms began several days ago.  Interventions attempted: Rest, hydration, or home remedies.  Symptoms are: gradually worsening.  Triage Disposition: See Physician Within 24 Hours- unable to find appt today- patient scheduled with UC   Patient/caregiver understands and will follow disposition?: Yes  Pt felll was  in hospitalized has scheduled follow up appt. Is still in pain.     08/07/24  9:35 AM Unsigned Note Copied from CRM #[id number]. Topic: Clinical - Medication Refill >> Aug 07, 2024  9:33 AM Zebedee SAUNDERS wrote: Medication: Hydrocodone       Reason for Disposition  [1] MODERATE pain (e.g., interferes with normal activities) AND [2] high-risk adult (e.g., age > 60 years, osteoporosis, chronic steroid use)  Answer Assessment - Initial Assessment Questions 1. LOCATION: Where does it hurt? (e.g., left, right)     Right 2. ONSET: When did the pain start?     *No Answer* 3. SEVERITY: How bad is the pain? (e.g., Scale 1-10; mild, moderate, or severe)     *No Answer* 4. PATTERN: Does the pain come and go, or is it constant?      *No Answer* 5. CAUSE: What do you think is causing the pain?     *No Answer* 6. OTHER SYMPTOMS:  Do you have any other symptoms? (e.g., fever, abdomen pain, vomiting, leg weakness, burning with urination, blood in urine)     *No Answer* 7. PREGNANCY:  Is there any chance you are pregnant? When was your last menstrual period?     *No Answer*  Answer Assessment - Initial Assessment Questions Patient with fall 9/18 and hospital admission. Patient experiencing pain to his right side that they are aware of from admission. He thought he would be  discharged with medicine for pain and is looking for some relief. No appointments available today, patient scheduled with UC at 3pm today (9/26).  1. MECHANISM: How did the injury happen?     fall 2. ONSET: When did the injury happen? (.e.g., minutes, hours, days ago)     9/18 3. LOCATION: Where on the chest is the injury located?     R side- rib area 4. APPEARANCE: What does the injury look like?     normal 5. BLEEDING: Is there any bleeding now? If Yes, ask: How long has it been bleeding?     denies 6. SEVERITY: Any difficulty with breathing?     Denies SOB but some radiating pain to the chest 2-3/10 7. SIZE: For cuts, bruises, or swelling, ask: How large is it? (e.g., inches or centimeters)     Cuts on arms from fall- area of pain looks normal  8. PAIN: Is there pain? If Yes, ask: How bad is the pain? (e.g., Scale 0-10; none, mild, moderate, severe)     9-10/10 pain on the side  9. TETANUS: For any breaks in the skin, ask: When was your last tetanus booster?     Unknown  Protocols used: Flank Pain-A-AH, Chest Injury-A-AH

## 2024-08-07 NOTE — Telephone Encounter (Signed)
 Requesting medication not on profile. Calling pt to triage pain.

## 2024-08-07 NOTE — Discharge Instructions (Signed)
 Apply heating pad 2 to 3 times daily if needed.  May take Tylenol  650mg  once or twice daily if needed.   If symptoms become significantly worse during the night or over the weekend, proceed to the local emergency room.

## 2024-08-07 NOTE — Telephone Encounter (Unsigned)
 Copied from CRM #[id number]. Topic: Clinical - Medication Refill >> Aug 07, 2024  9:33 AM Zebedee SAUNDERS wrote: Medication: Hydrocodone    Has the patient contacted their pharmacy? Yes (Agent: If no, request that the patient contact the pharmacy for the refill. If patient does not wish to contact the pharmacy document the reason why and proceed with request.) (Agent: If yes, when and what did the pharmacy advise?)Pharmacy need script.   This is the patient's preferred pharmacy:  [facility] [id number] - [place], [place] - [address] [place] [address] Phone: [phone] Fax: [phone]  Is this the correct pharmacy for this prescription? Yes If no, delete pharmacy and type the correct one.   Has the prescription been filled recently? Yes  Is the patient out of the medication? Yes  Has the patient been seen for an appointment in the last year OR does the patient have an upcoming appointment? Yes  Can we respond through MyChart? Yes  Agent: Please be advised that Rx refills may take up to 3 business days. We ask that you follow-up with your pharmacy.

## 2024-08-07 NOTE — Transitions of Care (Post Inpatient/ED Visit) (Signed)
   08/07/2024  Name: MEREL SANTOLI MRN: [id number] DOB: 1951/02/01  Today's TOC FU Call Status: Today's TOC FU Call Status:: Unsuccessful Call (1st Attempt) Unsuccessful Call (1st Attempt) Date: 08/07/24  Attempted to reach the patient regarding the most recent Inpatient/ED visit.  Follow Up Plan: Additional outreach attempts will be made to reach the patient to complete the Transitions of Care (Post Inpatient/ED visit) call.   Shona Prow RN, CCM [place]  VBCI-Population Health RN Care Manager [phone]

## 2024-08-07 NOTE — ED Provider Notes (Signed)
 [facility]    CSN: [id number] Arrival date & time: 08/07/24  1454      History   Chief Complaint Chief Complaint  Patient presents with   Rib Injury    recent d/c for fall and difficulty breathing - Entered by patient    HPI Gabriel Gilbert is a 73 y.o. male.   Patient fell out of bed on 07/30/24 resulting in right side chest pain, prompting him to seek ED evaluation on 07/31/24.  He was admitted for respiratory failure. CT angiogram showed bronchiolitis, treated with antibiotics, IV steroid, and oxygen.  He was successfully weaned off oxygen and discharged.  However, he continues to complain of right rib pain, requesting medication for pain.  The history is provided by the patient.    Past Medical History:  Diagnosis Date   Bell's palsy 12-10   Depression    Diabetes mellitus    type 2   Hyperlipidemia    Hypertension     Patient Active Problem List   Diagnosis Date Noted   Urinary incontinence 01/02/2024   Microalbuminuria due to type 2 diabetes mellitus (HCC) 10/01/2023   NSTEMI (non-ST elevated myocardial infarction) (HCC) 09/10/2022   Senile purpura 06/28/2022   Tremor 08/24/2021   Alcoholic fatty liver 08/22/2021   Alcohol abuse 02/14/2021   Insomnia 03/04/2020   Pulmonary nodule, left 04/24/2019   Urinary hesitancy 05/16/2018   Nicotine  dependence, uncomplicated 01/01/2016   Hydronephrosis with urinary obstruction due to ureteral calculus 01/01/2016   Benign non-nodular prostatic hyperplasia with lower urinary tract symptoms 01/01/2016   Left ureteral stone 01/01/2016   Left cervical radiculopathy 04/28/2015   Chondromalacia of right patellofemoral joint 04/28/2015   OSA on CPAP 06/26/2013   Severe episode of recurrent major depressive disorder (HCC) 04/21/2013   Generalized anxiety disorder 04/21/2013   Obesity 02/14/2012   RLS (restless legs syndrome) 03/12/2011   DERMATITIS, HANDS 08/04/2010   BELL'S PALSY 10/26/2009   CORTICAL SENILE  CATARACT 05/09/2009   LUMBAR RADICULOPATHY 11/26/2008   Type 2 diabetes mellitus with periodontal complication (HCC) 03/13/2007   INCONTINENCE, URGE 03/13/2007   TOBACCO DEPENDENCE 08/20/2006   Essential hypertension, benign 08/20/2006   KIDNEY STONE - NEPHROLITHIASIS 08/20/2006   Sleep apnea 08/20/2006    Past Surgical History:  Procedure Laterality Date   CATARACT EXTRACTION Left    kidney stone removed     pfts     normal       Home Medications    Prior to Admission medications   Medication Sig Start Date End Date Taking? Authorizing Provider  albuterol  (VENTOLIN  HFA) 108 (90 Base) MCG/ACT inhaler Inhale 2 puffs into the lungs every 6 (six) hours as needed for wheezing or shortness of breath.   Yes [provider]  ALPRAZolam  (XANAX ) 0.5 MG tablet Take 1 tablet (0.5 mg total) by mouth daily as needed. 06/23/24 10/21/24 Yes Geralene Kaiser, MD  aspirin EC 81 MG tablet Take 81 mg by mouth daily. Swallow whole.   Yes [provider]  celecoxib  (CELEBREX ) 100 MG capsule Take one tab daily PO.  Take with food.  May increase to BID if needed. 08/07/24  Yes Pauline Garnette LABOR, MD  DROPLET PEN NEEDLES 31G X 5 MM MISC USE FOR INJECTION DAILY 10/09/23  Yes Alvan Dorothyann BIRCH, MD  finasteride (PROSCAR) 5 MG tablet Take 5 mg by mouth daily.   Yes [provider]  glucose blood (ONE TOUCH TEST STRIPS) test strip Pt testing one time a day. 09/23/12  Yes Alvan Dorothyann BIRCH, MD  JARDIANCE  25 MG TABS tablet Take 1 tablet (25 mg total) by mouth daily. Take 25 mg by mouth daily. 06/17/23  Yes Alvan Dorothyann BIRCH, MD  lisinopril  (ZESTRIL ) 10 MG tablet TAKE 1 TABLET BY MOUTH DAILY 04/28/24  Yes Alvan Dorothyann BIRCH, MD  metFORMIN  (GLUCOPHAGE ) 1000 MG tablet Take 1 tablet (1,000 mg total) by mouth 2 (two) times daily with a meal. 01/02/24  Yes Alvan Dorothyann BIRCH, MD  metoprolol  succinate (TOPROL -XL) 25 MG 24 hr tablet TAKE 1 TABLET BY MOUTH DAILY 07/21/24  Yes Alvan Dorothyann BIRCH, MD  Misc. Devices MISC New CPAP machine with mask and supplies through [facility].  CPAP therapy at 14 cm. water pressure. Patient taking differently: 14 cm by Other route at bedtime. Per 08/06/24 [facility] 11/21/18  Yes [provider]  Misc. Devices MISC Take 7-14 cm by mouth at bedtime.   Yes [provider]  ONE TOUCH ULTRA TEST test strip TEST as directed 09/22/12  Yes Alvan Dorothyann BIRCH, MD  PARoxetine  (PAXIL ) 20 MG tablet Take 1 tablet (20 mg total) by mouth daily. 05/01/24  Yes Geralene Kaiser, MD  predniSONE  (DELTASONE ) 20 MG tablet Take 40 mg by mouth daily. 08/07/24 08/10/24 Yes [provider]  rosuvastatin  (CRESTOR ) 10 MG tablet Take 1 tablet (10 mg total) by mouth daily. 01/02/24  Yes Alvan Dorothyann BIRCH, MD  tamsulosin  (FLOMAX ) 0.4 MG CAPS capsule Take 0.4 mg by mouth at bedtime.   Yes [provider]  tirzepatide  (MOUNJARO ) 2.5 MG/0.5ML Pen Inject 2.5 mg into the skin once a week. 04/30/24  Yes Alvan Dorothyann BIRCH, MD  tirzepatide  (MOUNJARO ) 5 MG/0.5ML Pen Inject 5 mg into the skin once a week. 05/30/24  Yes Alvan Dorothyann BIRCH, MD  TOUJEO  SOLOSTAR 300 UNIT/ML Solostar Pen INJECT 37-40 UNITS INTO THE SKIN EVERY NIGHT AT BEDTIME. Patient taking differently: 40 Units daily. 04/01/24  Yes Alvan Dorothyann BIRCH, MD    Family History Family History  Problem Relation Age of Onset   COPD Mother    Hypertension Mother    Alzheimer's disease Mother        ?   Diabetes Father    COPD Father    Drug abuse Son    COPD Sister    Cancer Sister        lung    Social History Social History   Tobacco Use   Smoking status: Former    Current packs/day: 0.00    Average packs/day: 1 pack/day for 25.0 years (25.0 ttl pk-yrs)    Types: Cigarettes    Start date: 02/11/1988    Quit date: 02/10/2013    Years since quitting: 11.4   Smokeless tobacco: Never   Tobacco comments:    using electronic cigarettes   Vaping Use   Vaping status: Former  Substance Use Topics   Alcohol use: No    Alcohol/week: 0.0 standard drinks of alcohol   Drug use: No     Allergies   Patient has no known allergies.   Review of Systems Review of Systems No sore throat No cough + right chest/rib pain No wheezing No nasal congestion No post-nasal drainage No sinus pain/pressure No itchy/red eyes No earache No hemoptysis No SOB No fever/chills No nausea No vomiting No abdominal pain No diarrhea No urinary symptoms No skin rash No fatigue No myalgias No headache   Physical Exam Triage Vital Signs ED Triage Vitals  Encounter Vitals Group  BP 08/07/24 1505 (!) 122/92     Girls Systolic BP Percentile --      Girls Diastolic BP Percentile --      Boys Systolic BP Percentile --      Boys Diastolic BP Percentile --      Pulse Rate 08/07/24 1505 86     Resp 08/07/24 1505 20     Temp 08/07/24 1505 99 F (37.2 C)     Temp Source 08/07/24 1505 Oral     SpO2 08/07/24 1505 92 %     Weight --      Height --      Head Circumference --      Peak Flow --      Pain Score 08/07/24 1506 2     Pain Loc --      Pain Education --      Exclude from Growth Chart --    No data found.  Updated Vital Signs BP (!) 122/92 (BP Location: Right Arm)   Pulse 86   Temp 99 F (37.2 C) (Oral)   Resp 20   SpO2 92%   Visual Acuity Right Eye Distance:   Left Eye Distance:   Bilateral Distance:    Right Eye Near:   Left Eye Near:    Bilateral Near:     Physical Exam Vitals and nursing note reviewed.  Constitutional:      General: He is not in acute distress.    Appearance: He is not ill-appearing.  HENT:     Head: Normocephalic.     Nose: Nose normal.     Mouth/Throat:     Mouth: Mucous membranes are moist.     Pharynx: Oropharynx is clear.  Eyes:     Conjunctiva/sclera: Conjunctivae normal.     Pupils: Pupils are equal, round, and reactive to light.  Cardiovascular:     Rate and Rhythm:  Normal rate and regular rhythm.     Heart sounds: Normal heart sounds.  Pulmonary:     Breath sounds: Normal breath sounds.       Comments: Diffuse right lateral chest/rib tenderness to palpation as noted on diagram. No ecchymosis or swelling.   Chest:    Abdominal:     Palpations: Abdomen is soft.     Tenderness: There is no abdominal tenderness.  Musculoskeletal:     Cervical back: Neck supple. No rigidity.     Right lower leg: No edema.     Left lower leg: No edema.  Lymphadenopathy:     Cervical: No cervical adenopathy.  Skin:    General: Skin is warm and dry.  Neurological:     Mental Status: He is alert. Mental status is at baseline.      UC Treatments / Results  Labs (all labs ordered are listed, but only abnormal results are displayed) Labs Reviewed - No data to display  EKG   Radiology DG Ribs Unilateral W/Chest Right Result Date: 08/07/2024 CLINICAL DATA:  Persistent right rib pain after fall 07/31/2024 EXAM: RIGHT RIBS AND CHEST - 3+ VIEW COMPARISON:  Chest x-ray 12/15/2010 FINDINGS: Lungs are adequately inflated and otherwise clear. Cardiomediastinal silhouette is normal. No acute right rib fracture. Remainder of the exam is unchanged. IMPRESSION: 1. No acute cardiopulmonary disease. 2. No acute right rib fracture. Electronically Signed   By: Toribio Agreste M.D.   On: 08/07/2024 16:21    Procedures Procedures (including critical care time)  Medications Ordered in UC Medications - No data to display  Initial Impression / Assessment and Plan / UC Course  I have reviewed the triage vital signs and the nursing notes.  Pertinent labs & imaging results that were available during my care of the patient were reviewed by me and considered in my medical decision making (see chart for details).    No evidence rib fracture. Begin Celebrex  100mg  once daily.  May increase to BID if needed.  Followup with Family Doctor if not improved in two weeks.  Final  Clinical Impressions(s) / UC Diagnoses   Final diagnoses:  Rib pain on right side  Contusion, chest wall, left, initial encounter     Discharge Instructions      Apply heating pad 2 to 3 times daily if needed.  May take Tylenol  650mg  once or twice daily if needed.   If symptoms become significantly worse during the night or over the weekend, proceed to the local emergency room.      ED Prescriptions     Medication Sig Dispense Auth. Provider   celecoxib  (CELEBREX ) 100 MG capsule Take one tab daily PO.  Take with food.  May increase to BID if needed. 40 capsule Pauline Garnette LABOR, MD         Pauline Garnette LABOR, MD 08/08/24 [phone]

## 2024-08-07 NOTE — Transitions of Care (Post Inpatient/ED Visit) (Signed)
 08/07/2024  Name: Gabriel Gilbert MRN: [id number] DOB: 01-09-1951  Today's TOC FU Call Status: Today's TOC FU Call Status:: Successful TOC FU Call Completed TOC FU Call Complete Date: 08/07/24 Patient's Name and Date of Birth confirmed.  Transition Care Management Follow-up Telephone Call How have you been since you were released from the hospital?: Same Any questions or concerns?: No  Items Reviewed: Did you receive and understand the discharge instructions provided?: Yes Medications obtained,verified, and reconciled?: Yes (Medications Reviewed) Any new allergies since your discharge?: No Dietary orders reviewed?: NA Do you have support at home?: Yes People in Home [RPT]: alone Name of Support/Comfort Primary Source: states he has a friend and is at the friend's home at time of call  Medications Reviewed Today: TOC RN was unable to complete full medication review. Patient confirmed all meds from hospital discharge and said he was not at home but was at a friend's home and did not have meds with him but had the med list from discharge. Patient states he has an appointment at urgent care at 3pm for pain. TOC RN reviewed triage note and asked patient if he'd tried the heat or cold and he said he had not. TOC RN encouraged patient to try this and patient then said this or OTC medications do not help. Patient did not feel he needed to continue the call or enroll in [facility] program - unable to complete assessments Medications Reviewed Today     Reviewed by Lauro Shona LABOR, RN (Registered Nurse) on 08/07/24 at 1339  Med List Status: <None>   Medication Order Taking? Sig Documenting Provider Last Dose Status Informant  albuterol  (VENTOLIN  HFA) 108 (90 Base) MCG/ACT inhaler [id number] Yes Inhale 2 puffs into the lungs every 6 (six) hours as needed for wheezing or shortness of breath. [provider]  Active   ALPRAZolam  (XANAX ) 0.5 MG tablet [id number] Yes Take 1 tablet (0.5 mg total) by  mouth daily as needed. Geralene Kaiser, MD  Active   aspirin EC 81 MG tablet [id number] Yes Take 81 mg by mouth daily. Swallow whole. [provider]  Active   DROPLET PEN NEEDLES 31G X 5 MM MISC [id number] Yes USE FOR INJECTION DAILY Alvan Dorothyann BIRCH, MD  Active   finasteride (PROSCAR) 5 MG tablet [id number] Yes Take 5 mg by mouth daily. [provider]  Active   glucose blood (ONE TOUCH TEST STRIPS) test strip [id number]  Pt testing one time a day.  Patient not taking: Reported on 08/07/2024   Alvan Dorothyann BIRCH, MD  Active   JARDIANCE  25 MG TABS tablet [id number] Yes Take 1 tablet (25 mg total) by mouth daily. Take 25 mg by mouth daily. Alvan Dorothyann BIRCH, MD  Active   lisinopril  (ZESTRIL ) 10 MG tablet [id number] Yes TAKE 1 TABLET BY MOUTH DAILY Alvan Dorothyann BIRCH, MD  Active   metFORMIN  (GLUCOPHAGE ) 1000 MG tablet [id number] Yes Take 1 tablet (1,000 mg total) by mouth 2 (two) times daily with a meal. Alvan Dorothyann BIRCH, MD  Active   metoprolol  succinate (TOPROL -XL) 25 MG 24 hr tablet [id number] Yes TAKE 1 TABLET BY MOUTH DAILY Alvan Dorothyann BIRCH, MD  Active   Misc. Devices MISC [id number] Yes New CPAP machine with mask and supplies through [facility].  CPAP therapy at 14 cm. water pressure.  Patient taking differently: 14 cm by Other route at bedtime. Per 08/06/24 [facility]   [provider]  Active   Misc. Devices MISC [id number]  Yes Take 7-14 cm by mouth at bedtime. [provider]  Consider Medication Status and Discontinue (Duplicate)   ONE TOUCH ULTRA TEST test strip [id number] Yes TEST as directed Alvan Dorothyann BIRCH, MD  Active   PARoxetine  (PAXIL ) 20 MG tablet [id number]  Take 1 tablet (20 mg total) by mouth daily. Geralene Kaiser, MD  Active   predniSONE  (DELTASONE ) 20 MG tablet [id number] Yes Take 40 mg by mouth daily. [provider]  Active   rosuvastatin  (CRESTOR ) 10 MG tablet [id number]  Take 1  tablet (10 mg total) by mouth daily. Alvan Dorothyann BIRCH, MD  Active   tamsulosin  (FLOMAX ) 0.4 MG CAPS capsule [id number] Yes Take 0.4 mg by mouth at bedtime. [provider]  Active   tirzepatide  (MOUNJARO ) 2.5 MG/0.5ML Pen [id number]  Inject 2.5 mg into the skin once a week. Alvan Dorothyann BIRCH, MD  Active   tirzepatide  (MOUNJARO ) 5 MG/0.5ML Pen [id number]  Inject 5 mg into the skin once a week. Alvan Dorothyann BIRCH, MD  Active   TOUJEO  SOLOSTAR 300 UNIT/ML Solostar Pen [id number] Yes INJECT 37-40 UNITS INTO THE SKIN EVERY NIGHT AT BEDTIME.  Patient taking differently: 40 Units daily.   Alvan Dorothyann BIRCH, MD  Active             Home Care and Equipment/Supplies: Were Home Health Services Ordered?: NA (Ambulatory referral to Physical Therapy Evaluation and Treatment - deconditioning - will discuss with Dr Alvan) Any new equipment or medical supplies ordered?: No  Functional Questionnaire: Do you need assistance with bathing/showering or dressing?: No Do you need assistance with meal preparation?: No Do you need assistance with eating?: No Do you have difficulty maintaining continence: No Do you need assistance with getting out of bed/getting out of a chair/moving?: No Do you have difficulty managing or taking your medications?: No  Follow up appointments reviewed: PCP Follow-up appointment confirmed?: Yes Date of PCP follow-up appointment?: 08/11/24 Follow-up Provider: [facility] Follow-up appointment confirmed?: NA Do you need transportation to your follow-up appointment?: No Do you understand care options if your condition(s) worsen?: Yes-patient verbalized understanding  SDOH Interventions Today    Flowsheet Row Most Recent Value  SDOH Interventions   Food Insecurity Interventions Intervention Not Indicated  Housing Interventions Intervention Not Indicated  Transportation Interventions Intervention Not Indicated  Utilities  Interventions Intervention Not Indicated    Shona Prow RN, CCM [facility]  VBCI-Population Health RN Care Manager [phone]

## 2024-08-07 NOTE — ED Triage Notes (Signed)
 Patient states that he fell out of the bed on 07/30/2024 injuring his right rib area.  Patient was then admitted to the hospital for his breathing.  Patient is having continued pain in right rib area.  Requesting pain meds.

## 2024-08-10 ENCOUNTER — Other Ambulatory Visit: Payer: Self-pay | Admitting: Family Medicine

## 2024-08-10 DIAGNOSIS — I10 Essential (primary) hypertension: Secondary | ICD-10-CM

## 2024-08-11 ENCOUNTER — Encounter: Payer: Self-pay | Admitting: Family Medicine

## 2024-08-11 ENCOUNTER — Ambulatory Visit (INDEPENDENT_AMBULATORY_CARE_PROVIDER_SITE_OTHER): Admitting: Family Medicine

## 2024-08-11 VITALS — BP 127/61 | HR 74 | Ht 69.0 in | Wt 195.1 lb

## 2024-08-11 DIAGNOSIS — I10 Essential (primary) hypertension: Secondary | ICD-10-CM | POA: Diagnosis not present

## 2024-08-11 DIAGNOSIS — J40 Bronchitis, not specified as acute or chronic: Secondary | ICD-10-CM | POA: Diagnosis not present

## 2024-08-11 DIAGNOSIS — R0781 Pleurodynia: Secondary | ICD-10-CM

## 2024-08-11 DIAGNOSIS — Z794 Long term (current) use of insulin: Secondary | ICD-10-CM

## 2024-08-11 DIAGNOSIS — E11630 Type 2 diabetes mellitus with periodontal disease: Secondary | ICD-10-CM | POA: Diagnosis not present

## 2024-08-11 DIAGNOSIS — F332 Major depressive disorder, recurrent severe without psychotic features: Secondary | ICD-10-CM

## 2024-08-11 DIAGNOSIS — R0789 Other chest pain: Secondary | ICD-10-CM

## 2024-08-11 LAB — POCT GLYCOSYLATED HEMOGLOBIN (HGB A1C): Hemoglobin A1C: 7.4 % — AB (ref 4.0–5.6)

## 2024-08-11 MED ORDER — TRAMADOL HCL 50 MG PO TABS: 50.0000 mg | ORAL_TABLET | Freq: Three times a day (TID) | ORAL | 0 refills | Status: DC | PRN

## 2024-08-11 NOTE — Assessment & Plan Note (Signed)
 Currently on Toujeo  and Jardiance  we had tried to send in Mounjaro  back in June but he says he never got the prescription.  Will need to call the pharmacy to see if this needs a prior authorization.

## 2024-08-11 NOTE — Progress Notes (Signed)
 Established Patient Office Visit  Subjective  Patient ID: Gabriel Gilbert, male    DOB: [birth year]  Age: 73 y.o. MRN: [id number]  Chief Complaint  Patient presents with   Diabetes    HPI  Discussed the use of AI scribe software for clinical note transcription with the patient, who gave verbal consent to proceed.  History of Present Illness Gabriel Gilbert is a 73 year old male who presents with persistent right-sided chest pain and shortness of breath.  Right-sided chest pain - Persistent right-sided chest pain since hospitalization for acute bronchitis - Pain worsens with sitting and improves when lying down - No longer exacerbated by coughing or sneezing - No over-the-counter pain medications used due to perceived ineffectiveness - Soreness localized over the right rib area - Visited urgent care post-discharge for rib pain but did not receive pain medication  Dyspnea and hypoxemia - Experienced shortness of breath for weeks prior to hospital admission - Home oximeter readings around 88% prior to admission, initially attributed to device malfunction - Shortness of breath during hospitalization when oxygen levels were low - No current cough or sputum production  Recent hospitalization for acute bronchitis - Hospitalized from September 19th to September 25th for acute bronchitis - CT angiogram of the chest revealed multiple small peripheral nodular densities - Treated with IV steroids, IV antibiotics, and nebulizer treatments - Discharged with a three-day course of prednisone   Diabetes mellitus - History of diabetes mellitus - Recent increase in A1c from 7.2 to 7.4 - Prescribed Mounjaro  for glycemic control but did not receive medication from pharmacy - Continues to use Toujeo  for diabetes management - Recent eye exam completed - Adequate supplies for diabetes management  Constitutional and gastrointestinal symptoms - No nausea - No gallbladder issues  Musculoskeletal  symptoms - No neck or back pain     ROS    Objective:     BP 127/61   Pulse 74   Ht 5' 9 (1.753 m)   Wt 195 lb 1.9 oz (88.5 kg)   SpO2 93%   BMI 28.81 kg/m    Physical Exam Vitals and nursing note reviewed.  Constitutional:      Appearance: Normal appearance.  HENT:     Head: Normocephalic and atraumatic.  Eyes:     Conjunctiva/sclera: Conjunctivae normal.  Cardiovascular:     Rate and Rhythm: Normal rate and regular rhythm.  Pulmonary:     Effort: Pulmonary effort is normal.     Breath sounds: Normal breath sounds.     Comments: Crackles at the left lung base.  Skin:    General: Skin is warm and dry.  Neurological:     Mental Status: He is alert.  Psychiatric:        Mood and Affect: Mood normal.      Results for orders placed or performed in visit on 08/11/24  POCT HgB A1C  Result Value Ref Range   Hemoglobin A1C 7.4 (A) 4.0 - 5.6 %   HbA1c POC (<> result, manual entry)     HbA1c, POC (prediabetic range)     HbA1c, POC (controlled diabetic range)        The ASCVD Risk score (Arnett DK, et al., 2019) failed to calculate for the following reasons:   Risk score cannot be calculated because patient has a medical history suggesting prior/existing ASCVD    Assessment & Plan:   Problem List Items Addressed This Visit       Cardiovascular and Mediastinum  Essential hypertension, benign - Primary     Digestive   Type 2 diabetes mellitus with periodontal complication (HCC)   Currently on Toujeo  and Jardiance  we had tried to send in Mounjaro  back in June but he says he never got the prescription.  Will need to call the pharmacy to see if this needs a prior authorization.      Relevant Orders   POCT HgB A1C (Completed)     Other   Severe episode of recurrent major depressive disorder (HCC)   Follows with [facility]. On paxil  but reported not taking today.        Other Visit Diagnoses       Rib pain on right side       Relevant Medications    traMADol (ULTRAM) 50 MG tablet     Bronchitis           Assessment and Plan Assessment & Plan Bronchiolitis, bilateral lungs with hypoxemia Acute bronchitis and bronchiolitis with hypoxemia treated with IV steroids, antibiotics, and nebulizers. Discharged on prednisone . Shortness of breath persists. - Perform six-minute walk test to assess for hypoxemia. - Passed 6 min walk test.    Right rib pain Persistent pain post-fall, no fracture on imaging. Over-the-counter medications ineffective.  He is very tender on exam even though plain film has not necessarily shown fracture I am suspicious he did fall out of bed and injure the area.  And again is quite tender over the right lower lateral ribs. - Trial of tramadol for pain control for the next 5 days.. - Recommend continuing with ice packs.  Type 2 diabetes mellitus A1c increased to 7.4, possibly due to prednisone . Not on Mounjaro  due to insurance issues. Using Toujeo . - Check with pharmacy regarding Mounjaro  prescription and insurance coverage. - Continue Toujeo  for blood sugar management. - Will call for last eye exam done at my eye care center     Return in about 3 months (around 11/10/2024) for Diabetes follow-up.    Dorothyann Byars, MD

## 2024-08-11 NOTE — Assessment & Plan Note (Signed)
 Follows with [facility]. On paxil  but reported not taking today.

## 2024-08-12 NOTE — Telephone Encounter (Signed)
 Patient seen 08/11/2024 with Dr. Alvan

## 2024-08-14 ENCOUNTER — Ambulatory Visit: Payer: Self-pay

## 2024-08-14 DIAGNOSIS — E1142 Type 2 diabetes mellitus with diabetic polyneuropathy: Secondary | ICD-10-CM | POA: Diagnosis not present

## 2024-08-14 DIAGNOSIS — J9601 Acute respiratory failure with hypoxia: Secondary | ICD-10-CM | POA: Diagnosis not present

## 2024-08-14 DIAGNOSIS — R0602 Shortness of breath: Secondary | ICD-10-CM | POA: Diagnosis not present

## 2024-08-14 DIAGNOSIS — F419 Anxiety disorder, unspecified: Secondary | ICD-10-CM | POA: Diagnosis not present

## 2024-08-14 DIAGNOSIS — I10 Essential (primary) hypertension: Secondary | ICD-10-CM | POA: Diagnosis not present

## 2024-08-14 DIAGNOSIS — J208 Acute bronchitis due to other specified organisms: Secondary | ICD-10-CM | POA: Diagnosis not present

## 2024-08-14 DIAGNOSIS — E1169 Type 2 diabetes mellitus with other specified complication: Secondary | ICD-10-CM | POA: Diagnosis not present

## 2024-08-14 DIAGNOSIS — Z9181 History of falling: Secondary | ICD-10-CM | POA: Diagnosis not present

## 2024-08-14 DIAGNOSIS — I2510 Atherosclerotic heart disease of native coronary artery without angina pectoris: Secondary | ICD-10-CM | POA: Diagnosis not present

## 2024-08-14 DIAGNOSIS — R7989 Other specified abnormal findings of blood chemistry: Secondary | ICD-10-CM | POA: Diagnosis not present

## 2024-08-14 NOTE — Telephone Encounter (Signed)
 FYI Only or Action Required?: FYI only for provider.  Patient was last seen in primary care on 08/11/2024 by Alvan Dorothyann BIRCH, MD.  Called Nurse Triage reporting Low O2 Sat.  Symptoms began today.  Interventions attempted: Nothing.  Symptoms are: unchanged.  Triage Disposition: Go to ED Now (Notify PCP)  Patient/caregiver understands and will follow disposition?: Yes Reason for Disposition  Oxygen level (e.g., pulse oximetry) 90% or lower  Answer Assessment - Initial Assessment Questions Previous heart attack 2 years ago. Quit smoking 2 years ago, was vaping, stopped vaping a couple weeks ago. O2 on phone with this RN is 88%.   1. REASON FOR CALL: What is your main concern right now?     Calling because O2 was 87%-88%  2. ONSET: When did the low O2 start?     Today  7. OTHER SYMPTOMS: Do you have any other new symptoms?     Denies chest pain  Answer Assessment - Initial Assessment Questions Patient sounded slightly winded over the phone, denied SOB at first, explained what SOB can feel like and patient agreed that he does in fact feel winded and short of breath. O2 sat while on phone with this RN was 88%. Advised ED, patient stated ok will go right now.   1. RESPIRATORY STATUS: Describe your breathing? (e.g., wheezing, shortness of breath, unable to speak, severe coughing)      SOB  2. ONSET: When did this breathing problem begin?      Today  3. PATTERN Does the difficult breathing come and go, or has it been constant since it started?      Constant  4. SEVERITY: How bad is your breathing? (e.g., mild, moderate, severe)      Mild - moderate  5. RECURRENT SYMPTOM: Have you had difficulty breathing before? If Yes, ask: When was the last time? and What happened that time?      9/19  6. CARDIAC HISTORY: Do you have any history of heart disease? (e.g., heart attack, angina, bypass surgery, angioplasty)      CAD, prior heart attack  7. LUNG  HISTORY: Do you have any history of lung disease?  (e.g., pulmonary embolus, asthma, emphysema)     No, Hypoxic event on 9/19 went to ED  9. OTHER SYMPTOMS: Do you have any other symptoms? (e.g., chest pain, cough, dizziness, fever, runny nose)     Denies chest pain  10. O2 SATURATION MONITOR:  Do you use an oxygen saturation monitor (pulse oximeter) at home? If Yes, ask: What is your reading (oxygen level) today? What is your usual oxygen saturation reading? (e.g., 95%)       88%  Protocols used: No Guideline Available-A-AH, Breathing Difficulty-A-AH  Copied from CRM #[id number]. Topic: Clinical - Red Word Triage >> Aug 14, 2024 12:57 PM Gabriel Gilbert wrote: Red Word that prompted transfer to Nurse Triage: Patient is calling to report that some people state that he is breathing hard. He does not feel that way. Oxygen level between 87-88. Then jumps up to 90.

## 2024-08-14 NOTE — Progress Notes (Signed)
 Gabriel Gilbert                                          MRN: [id number]   08/14/2024   The [facility] Quality Team Specialist reviewed this patient medical record for the purposes of chart review for care gap closure. The following were reviewed: chart review for care gap closure-kidney health evaluation for diabetes:eGFR  and uACR.    [facility] Quality Team

## 2024-08-15 DIAGNOSIS — E119 Type 2 diabetes mellitus without complications: Secondary | ICD-10-CM | POA: Diagnosis not present

## 2024-08-15 DIAGNOSIS — Z720 Tobacco use: Secondary | ICD-10-CM | POA: Diagnosis not present

## 2024-08-15 DIAGNOSIS — Z794 Long term (current) use of insulin: Secondary | ICD-10-CM | POA: Diagnosis not present

## 2024-08-15 DIAGNOSIS — I10 Essential (primary) hypertension: Secondary | ICD-10-CM | POA: Diagnosis not present

## 2024-08-15 DIAGNOSIS — G4733 Obstructive sleep apnea (adult) (pediatric): Secondary | ICD-10-CM | POA: Diagnosis not present

## 2024-08-15 DIAGNOSIS — J9601 Acute respiratory failure with hypoxia: Secondary | ICD-10-CM | POA: Diagnosis not present

## 2024-08-15 DIAGNOSIS — J209 Acute bronchitis, unspecified: Secondary | ICD-10-CM | POA: Diagnosis not present

## 2024-08-15 DIAGNOSIS — I2510 Atherosclerotic heart disease of native coronary artery without angina pectoris: Secondary | ICD-10-CM | POA: Diagnosis not present

## 2024-08-17 ENCOUNTER — Ambulatory Visit: Payer: Self-pay

## 2024-08-17 NOTE — Telephone Encounter (Signed)
 FYI Only or Action Required?: Action required by provider: request for appointment.  Patient was last seen in primary care on 08/11/2024 by Alvan Dorothyann BIRCH, MD.  Called Nurse Triage reporting Anxiety.  Symptoms began several days ago.  Interventions attempted: Nothing.  Symptoms are: gradually worsening.  Triage Disposition: See PCP When Office is Open (Within 3 Days)  Patient/caregiver understands and will follow disposition?: YesCopied from CRM #[id number]. Topic: Clinical - Red Word Triage >> Aug 17, 2024 12:18 PM Carlatta H wrote: [facility] that prompted transfer to Nurse Triage: Patient was in the hospital for low oxygen and was discharged yesterday but still having low oxygen at 84// Reason for Disposition  MODERATE anxiety (e.g., persistent or frequent anxiety symptoms; interferes with sleep, school, or work)  Answer Assessment - Initial Assessment Questions Pt is anxious and no xanax . Pt can pick up on 10/13. I always take too much then I'm out and have to wait.  Hospital follow up: Pt was  discharged yesterday for bronchitis. Pt denies any SOB/breathing difficulty. No coughing. Pt sated my oxygen drops to 84% after moving around. While sitting oxygen is 92 %. Pt was told at hospital he would be nebulizer machine but hasn't received it. Pt stated I have the Medicine for it. I don't have contact info to follow up.    1. CONCERN: Did anything happen that prompted you to call today?      anxious 2. ANXIETY SYMPTOMS: Can you describe how you (your loved one; patient) have been feeling? (e.g., tense, restless, panicky, anxious, keyed up, overwhelmed, sense of impending doom).      anxious 3. ONSET: How long have you been feeling this way? (e.g., hours, days, weeks)     Yesterday  4. SEVERITY: How would you rate the level of anxiety? (e.g., 0 - 10; or mild, moderate, severe).     8 5. FUNCTIONAL IMPAIRMENT: How have these feelings affected your ability to do daily  activities? Have you had more difficulty than usual doing your normal daily activities? (e.g., getting better, same, worse; self-care, school, work, interactions)     na 6. HISTORY: Have you felt this way before? Have you ever been diagnosed with an anxiety problem in the past? (e.g., generalized anxiety disorder, panic attacks, PTSD). If Yes, ask: How was this problem treated? (e.g., medicines, counseling, etc.)     yes 7. RISK OF HARM - SUICIDAL IDEATION: Do you ever have thoughts of hurting or killing yourself? If Yes, ask:  Do you have these feelings now? Do you have a plan on how you would do this?     denies 8. TREATMENT:  What has been done so far to treat this anxiety? (e.g., medicines, relaxation strategies). What has helped?     na 9. THERAPIST: Do you have a counselor or therapist? If Yes, ask: What is their name?     na 10. POTENTIAL TRIGGERS: Do you drink caffeinated beverages (e.g., coffee, colas, teas), and how much daily? Do you drink alcohol or use any drugs? Have you started any new medicines recently?       tea 11. PATIENT SUPPORT: Who is with you now? Who do you live with? Do you have family or friends who you can talk to?        No one 12. OTHER SYMPTOMS: Do you have any other symptoms? (e.g., feeling depressed, trouble concentrating, trouble sleeping, trouble breathing, palpitations or fast heartbeat, chest pain, sweating, nausea, or diarrhea) Oxygen saturation drops while moving  Protocols used: Anxiety and Panic Attack-A-AH

## 2024-08-18 ENCOUNTER — Ambulatory Visit (INDEPENDENT_AMBULATORY_CARE_PROVIDER_SITE_OTHER): Admitting: Family Medicine

## 2024-08-18 ENCOUNTER — Encounter: Payer: Self-pay | Admitting: Family Medicine

## 2024-08-18 VITALS — BP 127/74 | HR 96 | Ht 69.0 in | Wt 190.0 lb

## 2024-08-18 DIAGNOSIS — R220 Localized swelling, mass and lump, head: Secondary | ICD-10-CM

## 2024-08-18 DIAGNOSIS — R0902 Hypoxemia: Secondary | ICD-10-CM | POA: Diagnosis not present

## 2024-08-18 DIAGNOSIS — J219 Acute bronchiolitis, unspecified: Secondary | ICD-10-CM | POA: Insufficient documentation

## 2024-08-18 NOTE — Progress Notes (Signed)
 Pt reports feeling nauseated. He was given Zofran 8mg  ODT and he laid on the bed.  I checked his BP  it was elevated. I told him that I would be back to check on him and to just lie there.  I came back to check on him and recheck his BP he stated that he wasn't feeling too well. I noticed that his O2 had actually dropped from what it was from when he had come in to 86%. I then placed him on 2L of O2  and his rate did improve after several minutes.  I then took him off of the oxygen since he reported that he was feeling better and his stats were at 98% and pulse was 99.   I then had him to sit up to which he said cause him to not feel to well. I had him to sit for a few more minutes and then took his BP and he was doing well. Pt was given a cup of water and was asked to sit for a while before being allowed to leave.

## 2024-08-18 NOTE — Progress Notes (Signed)
 Established Patient Office Visit  Subjective  Patient ID: Gabriel Gilbert, male    DOB: 07-Jul-1951  Age: 73 y.o. MRN: [id number]  Chief Complaint  Patient presents with   Hospitalization Follow-up    HPI  He is here today for hospital follow-up.  I had just seen him for hospital follow-up where he was admitted for acute respiratory failure when we saw him back his lungs were improved he was feeling better we even did a 6-minute walk test which he passed.  Unfortunately, about 3 days later he became short of breath again and went back to the hospital where his oxygen levels were around 87%.  He admitted on October 3 and was diagnosed with acute bronchitis.  He was discharged home the next day with Augmentin , DuoNeb and prednisone .  They also gave him a new order for a new CPAP machine.  Through [facility].  They did recommend that he follow-up with me as well as pulmonology in 1 to 2 weeks he does have an appointment scheduled with pulmonology here in [place].  Discussed the use of AI scribe software for clinical note transcription with the patient, who gave verbal consent to proceed.  History of Present Illness Gabriel Gilbert is a 73 year old male who presents with persistent shortness of breath and anxiety.  Dyspnea - Persistent shortness of breath with no improvement despite ongoing antibiotics and prednisone  therapy - Shortness of breath worsens after walking for approximately five minutes - Oxygen saturation drops to 84% with exertion - No cough productive of mucus, no fever, and no choking on food - Uses an inhaler regularly - Has medication for nebulizer but is awaiting delivery of nebulizer equipment  Anxiety - High levels of anxiety attributed to current health condition - He follows with psychiatry.  mandibular mass, on right near facial cheek and jaw.  - Bump near jaw present for approximately one year - Recent increase in size - No pain, including with  chewing - No recollection of trauma to the area  Nutritional intake - No food intake today or yesterday  Physical activity - Remains physically active by walking daily at locations such as [employer] and [employer] - Believes activity helps keep lungs open  Medication and supplement use - No new medications or supplements started recently      ROS    Objective:     BP 127/74   Pulse 96   Ht 5' 9 (1.753 m)   Wt 190 lb 0.6 oz (86.2 kg)   SpO2 93%   BMI 28.06 kg/m    Physical Exam Vitals and nursing note reviewed.  Constitutional:      Appearance: Normal appearance.  HENT:     Head: Normocephalic and atraumatic.     Comments: On right side of face he has a large mass over the right cheek bone near the ear.  Non tender firm but not rock hard.      Right Ear: Tympanic membrane, ear canal and external ear normal.  Eyes:     Conjunctiva/sclera: Conjunctivae normal.  Cardiovascular:     Rate and Rhythm: Normal rate and regular rhythm.  Pulmonary:     Effort: Pulmonary effort is normal.     Breath sounds: Normal breath sounds.     Comments: Crackles at the lung bases bilaterally  Skin:    General: Skin is warm and dry.  Neurological:     Mental Status: He is alert.  Psychiatric:  Mood and Affect: Mood normal.       No results found for any visits on 08/18/24.    The ASCVD Risk score (Arnett DK, et al., 2019) failed to calculate for the following reasons:   Risk score cannot be calculated because patient has a medical history suggesting prior/existing ASCVD    Assessment & Plan:   Problem List Items Addressed This Visit       Respiratory   Bronchiolitis - Primary   Relevant Orders   For home use only DME oxygen   Other Visit Diagnoses       Facial mass       Relevant Orders   CT Maxillofacial WO CM     Hypoxemia       Relevant Orders   For home use only DME oxygen       Assessment and Plan Assessment & Plan Dyspnea and  hypoxemia Persistent dyspnea and hypoxemia with oxygen saturation dropping to 84% after walking. No improvement with current treatment. Differential includes inadequate lung function or air trapping. Pulmonologist referral in place for further evaluation. - Perform six-minute walk test to assess oxygen saturation. - Consider home oxygen therapy if hypoxemia persists. - Ensure completion of antibiotics and steroids. - Follow up with pulmonologist for lung function tests. - We were unable to do a 6-minute walk test because he actually started feeling extremely nauseated while in the room we did end up giving him some nausea medicine as well as getting a little water and him and hydration he started to feel better before he left.  His oxygen did drop down to 86% while sitting in the room  Acute lower respiratory illness Ongoing symptoms with no significant improvement despite antibiotics and steroids. Persistent shortness of breath and crackles at lung bases. No fever or productive cough. - Continue antibiotics and steroids until completion. - Ensure use of inhaler and follow up on nebulizer delivery. - Encourage ambulation. - Contact office if nebulizer delivery is not scheduled by end of day.  Right-sided chest pain Right-sided chest pain associated with dyspnea and hypoxemia. Pain may be related to underlying respiratory condition.  Right facial  mass Right mandibular mass present for about a year, recently increased in size. Firm but slightly squishy, non-tender. Differential includes lymph node, growth, or abscess. - Schedule imaging (CT scan or MRI) to evaluate the submandibular mass.   No follow-ups on file.    Dorothyann Byars, MD

## 2024-08-18 NOTE — Patient Instructions (Signed)
 Make sure to complete your antibiotics and steroid.   Keep appt with the lung doctor We will schedule a CT on your face.

## 2024-08-19 ENCOUNTER — Telehealth: Payer: Self-pay

## 2024-08-19 DIAGNOSIS — J219 Acute bronchiolitis, unspecified: Secondary | ICD-10-CM

## 2024-08-19 DIAGNOSIS — R0902 Hypoxemia: Secondary | ICD-10-CM

## 2024-08-19 NOTE — Telephone Encounter (Signed)
 Copied from CRM [id number]. Topic: Clinical - Prescription Issue >> Aug 19, 2024  2:13 PM Amy B wrote: Reason for CRM: Patient states he still has not received his nebulizer.  He has his medication but does not have the pump.  Please advise.

## 2024-08-20 NOTE — Addendum Note (Signed)
 Addended by: Jeannette Maddy D on: 08/20/2024 07:34 AM   Modules accepted: Orders

## 2024-08-20 NOTE — Telephone Encounter (Signed)
 I will put in an order it was supposed to be ordered by the hospital they sent him the medication and he picked it up but the supplier never called to deliver the DME so I had told him yesterday that if he did not hear from anybody by the end of the day to give us  a callback and we would try to order the nebulizer.  Order printed.

## 2024-08-21 ENCOUNTER — Encounter: Payer: Self-pay | Admitting: Podiatry

## 2024-08-21 ENCOUNTER — Ambulatory Visit: Admitting: Podiatry

## 2024-08-21 DIAGNOSIS — B351 Tinea unguium: Secondary | ICD-10-CM

## 2024-08-21 DIAGNOSIS — M79675 Pain in left toe(s): Secondary | ICD-10-CM | POA: Diagnosis not present

## 2024-08-21 DIAGNOSIS — M79674 Pain in right toe(s): Secondary | ICD-10-CM | POA: Diagnosis not present

## 2024-08-21 DIAGNOSIS — E1142 Type 2 diabetes mellitus with diabetic polyneuropathy: Secondary | ICD-10-CM | POA: Diagnosis not present

## 2024-08-21 NOTE — Telephone Encounter (Signed)
 Copied from CRM [id number]. Topic: Clinical - Prescription Issue >> Aug 19, 2024  2:13 PM Amy B wrote: Reason for CRM: Patient states he still has not received his nebulizer.  He has his medication but does not have the pump.  Please advise.

## 2024-08-21 NOTE — Telephone Encounter (Signed)
 Nebulizer order faxed to [phone] [facility]

## 2024-08-21 NOTE — Progress Notes (Signed)
  Subjective:  Patient ID: Gabriel Gilbert, male    DOB: 1951-07-17,   MRN: [id number]  Chief Complaint  Patient presents with   [facility]    [facility]  A1c 7.6 08/14/24. 81 mg Asprin    73 y.o. male presents for concern of thickened elongated and painful nails that are difficult to trim. Requesting to have them trimmed today.  Denies burning and tingling in their feet. Patient is diabetic and last A1c was  Lab Results  Component Value Date   HGBA1C 7.4 (A) 08/11/2024   .   PCP:  Gabriel Dorothyann BIRCH, MD    . Denies any other pedal complaints. Denies n/v/f/c.   Past Medical History:  Diagnosis Date   Bell's palsy 12-10   Depression    Diabetes mellitus    type 2   Hyperlipidemia    Hypertension     Objective:  Physical Exam: Vascular: DP/PT pulses 1/4 bilateral. CFT <3 seconds. Absent hair growth on digits and legs. Edema noted to bilateral lower extremities. Xerosis noted bilaterally.  Skin. No lacerations or abrasions bilateral feet. Nails 1-5 bilateral  are thickened discolored and elongated with subungual debris.  Musculoskeletal: MMT 5/5 bilateral lower extremities in DF, PF, Inversion and Eversion. Deceased ROM in DF of ankle joint.  Neurological: Sensation intact to light touch. Protective sensation diminished bilateral.    Assessment:   1. Pain due to onychomycosis of toenails of both feet   2. Type 2 diabetes mellitus with peripheral neuropathy (HCC)      Plan:  Patient was evaluated and treated and all questions answered. -Discussed and educated patient on diabetic foot care, especially with  regards to the vascular, neurological and musculoskeletal systems.  -Stressed the importance of good glycemic control and the detriment of not  controlling glucose levels in relation to the foot. -Discussed supportive shoes at all times and checking feet regularly.  -Mechanically debrided all nails 1-5 bilateral using sterile nail nipper and filed with dremel without incident   -ABIs ordered.  -Answered all patient questions -Patient to return  in 3 months for at risk foot care -Patient advised to call the office if any problems or questions arise in the meantime.   Asberry Failing, DPM

## 2024-08-22 DIAGNOSIS — I4510 Unspecified right bundle-branch block: Secondary | ICD-10-CM | POA: Diagnosis not present

## 2024-08-22 DIAGNOSIS — F419 Anxiety disorder, unspecified: Secondary | ICD-10-CM | POA: Diagnosis not present

## 2024-08-22 DIAGNOSIS — Z87891 Personal history of nicotine dependence: Secondary | ICD-10-CM | POA: Diagnosis not present

## 2024-08-22 DIAGNOSIS — R9431 Abnormal electrocardiogram [ECG] [EKG]: Secondary | ICD-10-CM | POA: Diagnosis not present

## 2024-08-22 DIAGNOSIS — R110 Nausea: Secondary | ICD-10-CM | POA: Diagnosis not present

## 2024-08-23 DIAGNOSIS — R000 Tachycardia, unspecified: Secondary | ICD-10-CM | POA: Diagnosis not present

## 2024-08-23 DIAGNOSIS — Z87891 Personal history of nicotine dependence: Secondary | ICD-10-CM | POA: Diagnosis not present

## 2024-08-23 DIAGNOSIS — R110 Nausea: Secondary | ICD-10-CM | POA: Diagnosis not present

## 2024-08-25 ENCOUNTER — Ambulatory Visit: Payer: Self-pay

## 2024-08-25 ENCOUNTER — Other Ambulatory Visit: Payer: Self-pay | Admitting: Family Medicine

## 2024-08-25 DIAGNOSIS — R0789 Other chest pain: Secondary | ICD-10-CM

## 2024-08-25 NOTE — Telephone Encounter (Signed)
 Pharmacist called and was checking on status of the refill. She stated that she got a reply that said already responded but they did not receive a response. Please call pharmacy at [phone] and advise.

## 2024-08-25 NOTE — Telephone Encounter (Signed)
 Patient reports he passed out this morning at 6 AM while getting out of bed. Patient thinks he may have hit the closet door when he fell. Patient states he thinks he was out for a couple of minutes at most. Reports back pain started within the last couple of hours. Pain is 7 out of 10. Pain to back when trying to take a deep breath in. No SOB, CP. Patient recommended to the ED for evaluation with the fall, passing out and pain to back post fall. Verbalized understanding  FYI Only or Action Required?: FYI only for provider.  Patient was last seen in primary care on 08/18/2024 by Alvan Dorothyann BIRCH, MD.  Called Nurse Triage reporting Back Pain.  Symptoms began today.  Interventions attempted: Rest, hydration, or home remedies.  Symptoms are: gradually worsening.  Triage Disposition: Go to ED Now (or PCP Triage)  Patient/caregiver understands and will follow disposition?: Yes  Copied from CRM #[id number]. Topic: Clinical - Red Word Triage >> Aug 25, 2024  4:11 PM Tobias L wrote: Red Word that prompted transfer to Nurse Triage:  patient fell getting out of bed and passed out Reason for Disposition  Patient sounds very sick or weak to the triager  Answer Assessment - Initial Assessment Questions 1. ONSET: When did the pain begin? (e.g., minutes, hours, days)     Started a couple of hours ago 2. LOCATION: Where does it hurt? (upper, mid or lower back)     Middle back  3. SEVERITY: How bad is the pain?  (e.g., Scale 1-10; mild, moderate, or severe)     7 out of 10-pain when he inhales 4. PATTERN: Is the pain constant? (e.g., yes, no; constant, intermittent)      Intermittent on inhalation 5. RADIATION: Does the pain shoot into your legs or somewhere else?     no 6. CAUSE:  What do you think is causing the back pain?      Fell into the closet today 7. BACK OVERUSE:  Any recent lifting of heavy objects, strenuous work or exercise?     no 8. MEDICINES: What have you taken so  far for the pain? (e.g., nothing, acetaminophen , NSAIDS)     No medication 9. NEUROLOGIC SYMPTOMS: Do you have any weakness, numbness, or problems with bowel/bladder control?     no 10. OTHER SYMPTOMS: Do you have any other symptoms? (e.g., fever, abdomen pain, burning with urination, blood in urine)       no  Protocols used: Back Pain-A-AH

## 2024-08-27 ENCOUNTER — Other Ambulatory Visit: Payer: Self-pay | Admitting: Family Medicine

## 2024-08-27 DIAGNOSIS — E11630 Type 2 diabetes mellitus with periodontal disease: Secondary | ICD-10-CM

## 2024-08-28 ENCOUNTER — Other Ambulatory Visit: Payer: Self-pay | Admitting: Family Medicine

## 2024-08-28 ENCOUNTER — Telehealth: Payer: Self-pay | Admitting: Family Medicine

## 2024-08-28 ENCOUNTER — Ambulatory Visit

## 2024-08-28 ENCOUNTER — Ambulatory Visit: Payer: Self-pay | Admitting: Family Medicine

## 2024-08-28 DIAGNOSIS — R220 Localized swelling, mass and lump, head: Secondary | ICD-10-CM | POA: Diagnosis not present

## 2024-08-28 DIAGNOSIS — K118 Other diseases of salivary glands: Secondary | ICD-10-CM | POA: Diagnosis not present

## 2024-08-28 DIAGNOSIS — R0789 Other chest pain: Secondary | ICD-10-CM

## 2024-08-28 DIAGNOSIS — M799 Soft tissue disorder, unspecified: Secondary | ICD-10-CM | POA: Diagnosis not present

## 2024-08-28 MED ORDER — TRAMADOL HCL 50 MG PO TABS: 50.0000 mg | ORAL_TABLET | Freq: Three times a day (TID) | ORAL | 0 refills | Status: DC | PRN

## 2024-08-28 MED ORDER — AMOXICILLIN-POT CLAVULANATE 875-125 MG PO TABS: 1.0000 | ORAL_TABLET | Freq: Two times a day (BID) | ORAL | 0 refills | Status: DC

## 2024-08-28 NOTE — Telephone Encounter (Signed)
 Copied from CRM [id number]. Topic: Clinical - Medication Refill >> Aug 28, 2024  9:31 AM Zebedee SAUNDERS wrote: Medication: traMADol (ULTRAM) 50 MG tablet  Has the patient contacted their pharmacy? Yes (Agent: If no, request that the patient contact the pharmacy for the refill. If patient does not wish to contact the pharmacy document the reason why and proceed with request.) (Agent: If yes, when and what did the pharmacy advise?)Pharmacy need PCP approval.   This is the patient's preferred pharmacy:  [facility] [id number] - [place], [place] - [address] [place] [address] Phone: [phone] Fax: [phone]  Is this the correct pharmacy for this prescription? Yes If no, delete pharmacy and type the correct one.   Has the prescription been filled recently? Yes  Is the patient out of the medication? Yes  Has the patient been seen for an appointment in the last year OR does the patient have an upcoming appointment? Yes  Can we respond through MyChart? Yes  Agent: Please be advised that Rx refills may take up to 3 business days. We ask that you follow-up with your pharmacy.

## 2024-08-28 NOTE — Progress Notes (Signed)
 Hi Gabriel Gilbert, it looks like probably an infected sebaceous cyst based on the scan and I know Tonya mention that you had said it had started draining on its own.  I am going to send over antibiotics that I want you to start today continue to put warm compresses on it to promote drainage so that it can try to heal from the inside out.  If you feel like it is getting worse or not improving then you need to go to urgent care this weekend

## 2024-08-28 NOTE — Telephone Encounter (Signed)
 Copied from CRM [id number]. Topic: Clinical - Medication Refill >> Aug 28, 2024  9:31 AM Zebedee SAUNDERS wrote: Medication: traMADol (ULTRAM) 50 MG tablet  Has the patient contacted their pharmacy? Yes (Agent: If no, request that the patient contact the pharmacy for the refill. If patient does not wish to contact the pharmacy document the reason why and proceed with request.) (Agent: If yes, when and what did the pharmacy advise?)Pharmacy need PCP approval.   This is the patient's preferred pharmacy:  [facility] [id number] - [place], [place] - [address] [place] [address] Phone: [phone] Fax: [phone]  Is this the correct pharmacy for this prescription? Yes If no, delete pharmacy and type the correct one.   Has the prescription been filled recently? Yes  Is the patient out of the medication? Yes  Has the patient been seen for an appointment in the last year OR does the patient have an upcoming appointment? Yes  Can we respond through MyChart? Yes  Agent: Please be advised that Rx refills may take up to 3 business days. We ask that you follow-up with your pharmacy. >> Aug 28, 2024  9:36 AM Zebedee SAUNDERS wrote: Pharmacy state they never received refill order. Please send.

## 2024-08-28 NOTE — Telephone Encounter (Signed)
Patient advised of results and recommendations.  

## 2024-08-28 NOTE — Telephone Encounter (Signed)
 Called pharmacy to check on pt's medication was advised   Pt informed me that he fell out of his bed about 1 week ago. He denies hitting his head but c/o back pain and stated that he doesn't remember falling out of his bed. He said that he now sits on the side of the bed before standing up.

## 2024-08-30 ENCOUNTER — Other Ambulatory Visit: Payer: Self-pay | Admitting: Family Medicine

## 2024-08-30 DIAGNOSIS — R0789 Other chest pain: Secondary | ICD-10-CM

## 2024-08-31 ENCOUNTER — Ambulatory Visit: Admitting: Family Medicine

## 2024-08-31 ENCOUNTER — Encounter: Payer: Self-pay | Admitting: Family Medicine

## 2024-08-31 VITALS — BP 119/55 | HR 85 | Temp 99.2°F | Wt 192.0 lb

## 2024-08-31 DIAGNOSIS — L089 Local infection of the skin and subcutaneous tissue, unspecified: Secondary | ICD-10-CM | POA: Diagnosis not present

## 2024-08-31 DIAGNOSIS — E871 Hypo-osmolality and hyponatremia: Secondary | ICD-10-CM | POA: Diagnosis not present

## 2024-08-31 DIAGNOSIS — R457 State of emotional shock and stress, unspecified: Secondary | ICD-10-CM | POA: Diagnosis not present

## 2024-08-31 DIAGNOSIS — N400 Enlarged prostate without lower urinary tract symptoms: Secondary | ICD-10-CM | POA: Diagnosis not present

## 2024-08-31 DIAGNOSIS — E785 Hyperlipidemia, unspecified: Secondary | ICD-10-CM | POA: Diagnosis not present

## 2024-08-31 DIAGNOSIS — I700 Atherosclerosis of aorta: Secondary | ICD-10-CM | POA: Diagnosis not present

## 2024-08-31 DIAGNOSIS — J9601 Acute respiratory failure with hypoxia: Secondary | ICD-10-CM | POA: Diagnosis not present

## 2024-08-31 DIAGNOSIS — L723 Sebaceous cyst: Secondary | ICD-10-CM

## 2024-08-31 DIAGNOSIS — I2510 Atherosclerotic heart disease of native coronary artery without angina pectoris: Secondary | ICD-10-CM | POA: Diagnosis not present

## 2024-08-31 DIAGNOSIS — R110 Nausea: Secondary | ICD-10-CM

## 2024-08-31 DIAGNOSIS — F419 Anxiety disorder, unspecified: Secondary | ICD-10-CM | POA: Diagnosis not present

## 2024-08-31 DIAGNOSIS — E669 Obesity, unspecified: Secondary | ICD-10-CM | POA: Diagnosis not present

## 2024-08-31 DIAGNOSIS — I10 Essential (primary) hypertension: Secondary | ICD-10-CM | POA: Diagnosis not present

## 2024-08-31 DIAGNOSIS — E119 Type 2 diabetes mellitus without complications: Secondary | ICD-10-CM | POA: Diagnosis not present

## 2024-08-31 DIAGNOSIS — J441 Chronic obstructive pulmonary disease with (acute) exacerbation: Secondary | ICD-10-CM | POA: Diagnosis not present

## 2024-08-31 DIAGNOSIS — R0602 Shortness of breath: Secondary | ICD-10-CM | POA: Diagnosis not present

## 2024-08-31 DIAGNOSIS — R062 Wheezing: Secondary | ICD-10-CM | POA: Diagnosis not present

## 2024-08-31 MED ORDER — ONDANSETRON 4 MG PO TBDP: 4.0000 mg | ORAL_TABLET | Freq: Three times a day (TID) | ORAL | 1 refills | Status: DC | PRN

## 2024-08-31 NOTE — Progress Notes (Signed)
   Acute Office Visit  Subjective:     Patient ID: Gabriel Gilbert, male    DOB: [birth year], 73 y.o.   MRN: [id number]  Chief Complaint  Patient presents with   Medical Management of Chronic Issues    HPI Patient is in today for infected seb cyst on the left side of face.    Discussed the use of AI scribe software for clinical note transcription with the patient, who gave verbal consent to proceed.  History of Present Illness ZAYDN GUTRIDGE is a 73 year old male who presents with a draining sebaceous cyst.  Draining sebaceous cyst - Sebaceous cyst present for approximately two years, initially small and has increased in size over time - Significant drainage began prior to the weekend - Drainage described as slightly bloody and whitish in color - Girlfriend detected an odor from the drainage; he did not perceive any odor - Began oral antibiotics after drainage started - No use of warm compresses - Cyst covered with bandages due to embarrassment about appearance - No fever or red streaking - Reviewed CT results with him today  Nausea - Frequent episodes of nausea - Uses Zofran 4 mg, typically once daily, with good effect - Hospitalized twice for nausea and received Zofran during those visits - No diarrhea - Has not started Mounjaro  due to prescription issue  ROS      Objective:    BP (!) 119/55   Pulse 85   Temp 99.2 F (37.3 C) (Oral)   Wt 192 lb (87.1 kg)   SpO2 93%   BMI 28.35 kg/m    Physical Exam Skin:    Comments: Seb cyst on right facial cheek over jaw bone. Looks much smaller. WAs able to express some content from the wound.       No results found for any visits on 08/31/24.      Assessment & Plan:   Problem List Items Addressed This Visit   None Visit Diagnoses       Infected sebaceous cyst    -  Primary     Chronic nausea       Relevant Medications   ondansetron (ZOFRAN-ODT) 4 MG disintegrating tablet   Other Relevant Orders    Ambulatory referral to Gastroenterology      Assessment and Plan Assessment & Plan Infected sebaceous cyst of face Infected sebaceous cyst with resolved pus drainage, reduced swelling, and slight lymphadenopathy. - Complete antibiotic course. - Apply warm compresses or warm water daily to facilitate drainage. - Monitor for worsening infection signs and report if present.  Chronic nausea Persistent nausea requiring daily Zofran use, unrelated to Mounjaro . Further evaluation needed. - Prescribe Zofran 4 mg for nausea. - Refer to gastroenterologist for evaluation. - Investigate Mounjaro  prescription status with pharmacy.   Meds ordered this encounter  Medications   ondansetron (ZOFRAN-ODT) 4 MG disintegrating tablet    Sig: Take 1 tablet (4 mg total) by mouth every 8 (eight) hours as needed for nausea or vomiting.    Dispense:  20 tablet    Refill:  1    No follow-ups on file.  Dorothyann Byars, MD

## 2024-09-01 DIAGNOSIS — N400 Enlarged prostate without lower urinary tract symptoms: Secondary | ICD-10-CM | POA: Diagnosis not present

## 2024-09-01 DIAGNOSIS — I10 Essential (primary) hypertension: Secondary | ICD-10-CM | POA: Diagnosis not present

## 2024-09-01 DIAGNOSIS — R0902 Hypoxemia: Secondary | ICD-10-CM | POA: Diagnosis not present

## 2024-09-01 DIAGNOSIS — E119 Type 2 diabetes mellitus without complications: Secondary | ICD-10-CM | POA: Diagnosis not present

## 2024-09-01 DIAGNOSIS — J9601 Acute respiratory failure with hypoxia: Secondary | ICD-10-CM | POA: Diagnosis not present

## 2024-09-02 DIAGNOSIS — N400 Enlarged prostate without lower urinary tract symptoms: Secondary | ICD-10-CM | POA: Diagnosis not present

## 2024-09-02 DIAGNOSIS — E785 Hyperlipidemia, unspecified: Secondary | ICD-10-CM | POA: Diagnosis not present

## 2024-09-02 DIAGNOSIS — E119 Type 2 diabetes mellitus without complications: Secondary | ICD-10-CM | POA: Diagnosis not present

## 2024-09-02 DIAGNOSIS — J9601 Acute respiratory failure with hypoxia: Secondary | ICD-10-CM | POA: Diagnosis not present

## 2024-09-02 DIAGNOSIS — I10 Essential (primary) hypertension: Secondary | ICD-10-CM | POA: Diagnosis not present

## 2024-09-03 DIAGNOSIS — J9601 Acute respiratory failure with hypoxia: Secondary | ICD-10-CM | POA: Diagnosis not present

## 2024-09-03 DIAGNOSIS — J441 Chronic obstructive pulmonary disease with (acute) exacerbation: Secondary | ICD-10-CM | POA: Diagnosis not present

## 2024-09-03 DIAGNOSIS — E119 Type 2 diabetes mellitus without complications: Secondary | ICD-10-CM | POA: Diagnosis not present

## 2024-09-03 DIAGNOSIS — I10 Essential (primary) hypertension: Secondary | ICD-10-CM | POA: Diagnosis not present

## 2024-09-04 ENCOUNTER — Telehealth: Payer: Self-pay

## 2024-09-04 NOTE — Transitions of Care (Post Inpatient/ED Visit) (Signed)
   09/04/2024  Name: Gabriel Gilbert MRN: [id number] DOB: [birth year]  Today's TOC FU Call Status: Today's TOC FU Call Status:: Successful TOC FU Call Completed TOC FU Call Complete Date: 09/04/24 [facility] RN spoke with patient and explained TOC program and patient simply said nah - patient declined TOC program/call) Patient's Name and Date of Birth confirmed.  Transition Care Management Follow-up Telephone Call Date of Discharge: 09/03/24 Discharge Facility: Other ([facility]) Name of Other ([facility]) Discharge Facility: [facility] Type of Discharge: Inpatient Admission Primary Inpatient Discharge Diagnosis:: Acute respiratory failure with hypoxia  TOC RN reviewed chart and noted hospital follow is scheduled with PCP Shona Prow RN, CCM Miguel Barrera  VBC[facility]Population Health RN Care Manager [phone]

## 2024-09-07 DIAGNOSIS — Z87891 Personal history of nicotine dependence: Secondary | ICD-10-CM | POA: Diagnosis not present

## 2024-09-07 DIAGNOSIS — Z794 Long term (current) use of insulin: Secondary | ICD-10-CM | POA: Diagnosis not present

## 2024-09-07 DIAGNOSIS — R0789 Other chest pain: Secondary | ICD-10-CM | POA: Diagnosis not present

## 2024-09-07 DIAGNOSIS — R059 Cough, unspecified: Secondary | ICD-10-CM | POA: Diagnosis not present

## 2024-09-07 DIAGNOSIS — R109 Unspecified abdominal pain: Secondary | ICD-10-CM | POA: Diagnosis not present

## 2024-09-07 DIAGNOSIS — R935 Abnormal findings on diagnostic imaging of other abdominal regions, including retroperitoneum: Secondary | ICD-10-CM | POA: Diagnosis not present

## 2024-09-07 DIAGNOSIS — Z8679 Personal history of other diseases of the circulatory system: Secondary | ICD-10-CM | POA: Diagnosis not present

## 2024-09-07 DIAGNOSIS — Z79899 Other long term (current) drug therapy: Secondary | ICD-10-CM | POA: Diagnosis not present

## 2024-09-07 DIAGNOSIS — Z760 Encounter for issue of repeat prescription: Secondary | ICD-10-CM | POA: Diagnosis not present

## 2024-09-07 DIAGNOSIS — I7143 Infrarenal abdominal aortic aneurysm, without rupture: Secondary | ICD-10-CM | POA: Diagnosis not present

## 2024-09-07 DIAGNOSIS — R112 Nausea with vomiting, unspecified: Secondary | ICD-10-CM | POA: Diagnosis not present

## 2024-09-07 DIAGNOSIS — F419 Anxiety disorder, unspecified: Secondary | ICD-10-CM | POA: Diagnosis not present

## 2024-09-07 DIAGNOSIS — I4510 Unspecified right bundle-branch block: Secondary | ICD-10-CM | POA: Diagnosis not present

## 2024-09-07 DIAGNOSIS — R110 Nausea: Secondary | ICD-10-CM | POA: Diagnosis not present

## 2024-09-07 DIAGNOSIS — R0602 Shortness of breath: Secondary | ICD-10-CM | POA: Diagnosis not present

## 2024-09-07 DIAGNOSIS — J441 Chronic obstructive pulmonary disease with (acute) exacerbation: Secondary | ICD-10-CM | POA: Diagnosis not present

## 2024-09-07 NOTE — ED Provider Notes (Signed)
 [facility]  ED Provider Note  Gabriel Gilbert 73 y.o. male DOB: 11-10-1951 MRN: [id number] History   Chief Complaint  Patient presents with  . Nausea    BIB EMS from home for nausea x1 day, pt reports taking around 15 pills of zofran yesterday for nausea with no change.    Gabriel Gilbert presents to the ED with severe nausea x 2 days.  He has a past medical history of diabetes, hypertension, hyperlipidemia.  He was admitted between 08/31/24 and 09/03/2024 for a COPD exacerbation and was discharged home on antibiotics.  He reports that he has been experiencing severe nausea without vomiting.  No abdominal pain.  No constipation or diarrhea.  No dysuria, urinary hesitancy, urinary frequency, or other urologic symptoms.  He states that his breathing is better than it was in the hospital.  He is still coughing a little bit.  No chest pain.  He took 15 Zofran pills yesterday.  He did not take any antiemetics today because he was out.  He also reports that he has been out of his Xanax  for a week or 2 and that he needs a refill.  He understands that he took too much, and it is too early for his prescriber to refill it.   History provided by:  Patient Nausea       Past Medical History:  Diagnosis Date  . Anxiety   . Benign non-nodular prostatic hyperplasia with lower urinary tract symptoms 01/01/2016  . Essential hypertension 01/01/2016  . Hydronephrosis with urinary obstruction due to ureteral calculus 01/01/2016  . Left ureteral stone 01/01/2016  . Mixed diabetic hyperlipidemia associated with type 2 diabetes mellitus (*) 01/01/2016  . Nicotine  dependence, uncomplicated 01/01/2016  . OSA (obstructive sleep apnea) 01/01/2016  . Type 2 diabetes mellitus without complication, with long-term current use of insulin  (*) 01/01/2016    Past Surgical History:  Procedure Laterality Date  . Colonoscopy    . Kidney stone surgery    . Nose surgery      Social  History   Substance and Sexual Activity  Alcohol Use Not Currently   Comment: occasionally for anxiety   Tobacco Use History[1] E-Cigarettes  . Vaping Use Current Every Day User   . Start Date    . Cartridges/Day    . Quit Date     Social History   Substance and Sexual Activity  Drug Use No         Allergies[2]  Discharge Medication List as of 09/07/2024  8:50 PM     CONTINUE these medications which have NOT CHANGED   Details  albuterol  sulfate HFA (PROVENTIL ,VENTOLIN ,PROAIR ) 108 (90 Base) MCG/ACT inhaler Inhale two puffs into the lungs every 6 (six) hours as needed for Wheezing or Shortness of Breath., Starting Thu 08/06/2024, Normal    ALPRAZolam  (XANAX ) 0.5 mg tablet Take one tablet (0.5 mg dose) by mouth as needed., Starting Mon 08/24/2024, Historical Med    amoxicillin -clavulanate (AUGMENTIN ) 875-125 mg per tablet Take one tablet by mouth 2 (two) times daily for 10 days., Starting Thu 09/03/2024, Until Sun 09/13/2024, Normal    aspirin (ECOTRIN LOW DOSE) EC tablet Take one tablet (81 mg dose) by mouth daily., Starting Fri 08/07/2024, Until Sat 08/07/2025, No Print    atorvastatin  (LIPITOR) 40 mg tablet Take one tablet (40 mg dose) by mouth at bedtime., Starting Thu 08/06/2024, Normal    dextromethorphan-guaiFENesin (MUCINEX DM) 30-600 mg per 12 hr tablet Take one tablet by mouth every  12 (twelve) hours as needed for up to 10 days., Starting Thu 09/03/2024, Until Sun 09/13/2024 at 2359, Normal    finasteride (PROSCAR) 5 mg tablet Take one tablet (5 mg dose) by mouth daily., Starting Thu 08/06/2024, Until Sun 08/01/2025, Normal    Glucose Blood (BLOOD GLUCOSE TEST STRIPS) STRP Historical Med    insulin  glargine (LANTUS  SOLOSTAR) 100 units/mL injection Inject thirty Units into the skin at bedtime., Starting Thu 09/03/2024, Normal    !! Insulin  Pen Needle (BD,UNIFINE) 31G X 5 MM MISC use once daily as directed by prescriber, Historical Med    !! Insulin  Pen Needle 32G X 6 MM  MISC by Does not apply route., Starting Mon 10/08/2011, Historical Med    ipratropium-albuterol  (DUONEB) 0.5-2.5 mg/3 mL ML SOLN nebulizer solution Take 3 mLs by nebulization 4 (four) times a day as needed., Starting Thu 09/03/2024, Normal    JARDIANCE  25 MG TABS tablet Take one tablet (25 mg dose) by mouth daily., Starting Tue 08/13/2023, Historical Med    lisinopril  (PRINIVIL ,ZESTRIL ) 10 mg tablet Take one tablet (10 mg dose) by mouth daily., Historical Med    metformin  (GLUCOPHAGE ) 1000 MG tablet take 1 tablet by mouth twice a day with food, Historical Med    metoprolol  succinate (TOPROL -XL) 25 mg 24 hr tablet Take one tablet (25 mg dose) by mouth daily., Starting Tue 07/21/2024, Historical Med    !! Misc. Devices MISC Set CPAP to 14 cm. water pressure.  [facility]., Print    !! Misc. Devices MISC Please order a new mask DME:[facility], Print    !! Misc. Devices MISC New CPAP machine with mask and supplies through [facility].  CPAP therapy at 14 cm. water pressure., Print    !! Misc. Devices MISC Referral to CBT for insomnia., Print    !! Misc. Devices MISC Change CPAP settings to auto 7-14 cmwater pressure, Print    !! Misc. Devices MISC Needs CPAP supplies, Print    ondansetron (ZOFRAN-ODT) 4 mg disintegrating tablet Take one tablet (4 mg dose) by mouth every 8 (eight) hours as needed., Starting Mon 08/31/2024, Historical Med    tamsulosin  (FLOMAX ) 0.4 mg CAPS Take one capsule (0.4 mg dose) by mouth at bedtime., Starting Thu 08/06/2024, Normal    traMADol (ULTRAM) 50 mg tablet Take one tablet to two tablets (50-100 mg dose) by mouth every 8 (eight) hours as needed., Starting Tue 08/11/2024, Historical Med     !! - Potential duplicate medications found. Please discuss with provider.      Primary Survey  Primary Survey  Review of Systems   Review of Systems  Gastrointestinal:  Negative for blood in stool.    Physical Exam   ED Triage Vitals [09/07/24 1554]  BP  (!) 142/109  Heart Rate 97  Resp 18  SpO2 91 %  Temp 98.7 F (37.1 C)    Physical Exam  Nursing note and vitals reviewed. Constitutional: He does not appear distressed and no respiratory distress.  HENT:  Head: Normocephalic and atraumatic.  Eyes: Conjunctivae are normal.  Cardiovascular: Normal rate, regular rhythm and normal heart sounds.  Pulmonary/Chest: No respiratory distress. Tachypneic. No wheezing. No rhonchi.  Abdominal: Soft. There is no abdominal tenderness. Abdomen appears distended. Bowel sounds are normal.  Musculoskeletal: No obvious deformity noted to extremities.   Neurological: He is alert and oriented to person, place, and time. Moves all extremities equally.  Skin: Skin is warm. Skin is dry.  Psychiatric: He has a normal mood and  affect.     ED Course   Lab results:   CBC AND DIFFERENTIAL - Abnormal      Result Value   WBC 10.7 (*)    RBC 5.53     HGB 15.9     HCT 47.3     MCV 85.5     MCH 28.8     MCHC 33.6     Plt Ct 367     RDW SD 42.5     MPV 9.7     NRBC% 0.0     Absolute NRBC Count 0.00     NEUTROPHIL % 77.7     LYMPHOCYTE % 13.2     MONOCYTE % 7.3     Eosinophil % 0.9     BASOPHIL % 0.3     IG% 0.6     ABSOLUTE NEUTROPHIL COUNT 8.32 (*)    ABSOLUTE LYMPHOCYTE COUNT 1.41     Absolute Monocyte Count 0.78     Absolute Eosinophil Count 0.10     Absolute Basophil Count 0.03     Absolute Immature Granulocyte Count 0.06 (*)   COMPREHENSIVE METABOLIC PANEL - Abnormal   Na 135 (*)    Potassium 4.9     Cl 92 (*)    CO2 29     AGAP 14     Glucose 158 (*)    BUN 23     Creatinine 1.17     Ca 10.4 (*)    ALK PHOS 101     T Bili 0.3     Total Protein 6.9     Alb 4.5     GLOBULIN 2.4     ALBUMIN/GLOBULIN RATIO 1.9     BUN/CREAT RATIO 19.7     ALT 16     AST 18     eGFR 67     Comment: Normal GFR (glomerular filtration rate) > 60 mL/min/1.73 meters squared, < 60 may include impaired kidney function. Calculation based on the  Chronic Kidney Disease Epidemiology Collaboration (CK-EPI)equation refit without adjustment for race.  GEN5 CARDIAC TROPONIN T (TNT5) BASELINE - Normal   TnT-Gen5 (0hr) 17     Comment: An elevated Troponin indicates myocardial damage. Elevated troponin may also be due to pulmonary emboli, aortic dissection, heart failure, trauma, toxins and ischemia in the setting of critical illness.   D-DIMER, QUANTITATIVE - Normal   D-Dimer 0.44     Comment: The results of D-Dimer testing should be evaluated in the context of all clinical and laboratory data available. D-Dimer cut off value is 0.50 ug/ml FEU. D-Dimer assay can be used to exclude DVT & PE, but should not be used to exclude patients with: - Therapeutic dose anticoagulant therapy for >24 hours.  - Fibrinolytic therapy within previous 7 days  - Trauma or surgery within previous 4 weeks  - Disseminated malignancies  - Aortic aneurysm  - Sepsis, severe infections, pneumonia, severe skin infections  - Liver cirrhosis  - Pregnancy   LIPASE - Normal   Lipase 44    GEN5 CARDIAC TROPONIN T(TNT5) 1 HOUR - Normal   TnT-Gen5 (1hr) 15     Comment: An elevated Troponin indicates myocardial damage. Elevated troponin may also be due to pulmonary emboli, aortic dissection, heart failure, trauma, toxins and ischemia in the setting of critical illness.   Delta 1 Hour -2    GEN5 CARDIAC TROPONIN T(TNT5) 3 HOUR - Normal   TnT-Gen5 (3hr) 17     Comment: An elevated Troponin indicates myocardial  damage. Elevated troponin may also be due to pulmonary emboli, aortic dissection, heart failure, trauma, toxins and ischemia in the setting of critical illness.   Delta 3 Hour 0    URINE CUP   Narrative:    The following orders were created for panel order Urine Cup Urine, Clean Catch. Procedure                               Abnormality         Status                    ---------                               -----------         ------                    Urine  Cup[[id number]]                                       Final result               Please view results for these tests on the individual orders.  GOLD SST  URINE CUP    Imaging:   XR CHEST AP PORTABLE   Narrative:    AP chest radiograph: 09/07/2024 5:05 PM  Indication: 73 years  old Male with Shortness of breath.  Comparison: 08/31/2024  Findings: The cardiomediastinal silhouette is normal in size.  Atherosclerotic calcifications are seen at the aortic arch.  No pneumothorax is seen.   No acute airspace opacities are seen.  No discrete pleural effusion is apparent.    Impression:    Impression: No acute airspace opacities are seen.  Electronically Signed by: Rock Croft, MD on 09/07/2024 5:05 PM  CT CHEST ABDOMEN PELVIS W IV CONTRAST   Narrative:    HISTORY: severe nausea, abd pain, cough  COMPARISON: 08/31/2024, 02/26/2022  TECHNIQUE: Contrast-enhanced CT chest, abdomen, and pelvis was performed.  Axial, coronal, and sagittal images were included. Radiation dose reduction was utilized (automated exposure control, mA or kV adjustment based on patient size, or iterative image reconstruction).   CONTRAST:  80 mL IOPAMIDOL 76 % IV SOLN  INTERPRETATION:      CT CHEST:   Lung and pleura: The lungs are clear.  There is no pleural effusion or pneumothorax.  Heart and mediastinum: There is no adenopathy.  The heart size is normal. Coronary artery calcification is present.  Thyroid: No CT evidence for a nodule.         CT ABDOMEN/PELVIS:  LIVER: No focal hepatic abnormalities. Patent portal vein .  GALLBLADDER: Contracted though grossly normal.  SPLEEN: Normal.  PANCREAS: Scattered calcifications of the pancreatic head. No acute pancreatitis or visualized mass.  KIDNEYS: Small cyst of the lower pole of the left kidney. No acute findings.  ADRENALS: Stable small right adrenal nodule and left adrenal gland thickening.  BOWEL: No bowel obstruction or  evidence of bowel wall thickening.  No free air.  APPENDIX: Normal appendix.   VASCULATURE, FLUID SURVEY, LYMPH NODES : No free fluid or adenopathy. 4 cm infrarenal abdominal aortic aneurysm has increased from 3.4 cm. Prostatomegaly.  BONES AND SOFT TISSUES: No acute osseous findings.    Impression:  IMPRESSION:  1. No acute findings. 2. Increase in size of 4 cm abdominal aortic aneurysm. Recommend one-year follow-up.   Please note that any pulmonary nodule follow up recommendations are in accordance with the Fleischner Society 2017 guidelines, unless otherwise stated.  Electronically Signed by: Lynwood Portugal on 09/07/2024 6:44 PM     ECG: ECG Results          ECG 12 lead (Final result)      Collection Time Result Time Acquisition Device Systolic BP Diastolic BP Ventricular Rate Atrial Rate P-R Interval QRS Duration Q-T Interval QTC Calculation(Bazett) Calculated P Axis   09/07/24 19:41:43 09/08/24 08:35:21 D3K 125 65 88 88 160 130 370 447 78         Collection Time Result Time Calculated R Axis Calculated T Axis ECGDiag   09/07/24 19:41:43 09/08/24 08:35:21 267 71 Sinus rhythm with marked sinus arrhythmia Right bundle branch block Abnormal ECG When compared with ECG of 07-Sep-2024 17:48, aberrant conduction is no longer present Badal, Madan ([id number]) on 09/08/2024 8:35:18 AM certifies that he/she has reviewed the ECG tracing and confirms the independent interpretation is correct.               Final result                            ECG 12 lead (Final result)      Collection Time Result Time Acquisition Device Systolic BP Diastolic BP Ventricular Rate Atrial Rate P-R Interval QRS Duration Q-T Interval QTC Calculation(Bazett) Calculated P Axis   09/07/24 17:48:42 09/07/24 19:13:12 D3K 119 70 97 97 148 128 370 469 75         Collection Time Result Time Calculated R Axis Calculated T Axis ECGDiag   09/07/24 17:48:42 09/07/24 19:13:12 267 76 Sinus rhythm  with premature atrial complexes with aberrant conduction Right bundle branch block Possible Lateral infarct , age undetermined Abnormal ECG When compared with ECG of 07-Sep-2024 16:35, No significant change was found No acute injury pattern Brien Kung ([id number]) on 09/07/2024 7:13:08 PM certifies that he/she has reviewed the ECG tracing and confirms the independent interpretation is correct.               Final result                            ECG 12 lead (Final result)      Collection Time Result Time Acquisition Device Systolic BP Diastolic BP Ventricular Rate Atrial Rate P-R Interval QRS Duration Q-T Interval QTC Calculation(Bazett) Calculated P Axis   09/07/24 16:35:47 09/07/24 19:16:41 D3K 119 70 90 90 156 132 372 455 77         Collection Time Result Time Calculated R Axis Calculated T Axis ECGDiag   09/07/24 16:35:47 09/07/24 19:16:41 263 72 Sinus rhythm with premature atrial complexes with aberrant conduction Right bundle branch block Abnormal ECG When compared with ECG of 31-Aug-2024 22:37, aberrant conduction is now present No acute injury pattern Brien Kung ([id number]) on 09/07/2024 7:16:39 PM certifies that he/she has reviewed the ECG tracing and confirms the independent interpretation is correct.               Final result                               HEAR Score History: Mostly low risk  features ECG: Non specific repolarisation disturbance Age: 13 or greater Risk Factors: More than or equal 3 risk factors or history of atherosclerotic disease HEAR Score Total: 5                                                           Pre-Sedation Procedures    Medical Decision Making Gabriel Gilbert presents to the ED with severe nausea.  He was recently hospitalized for a COPD exacerbation.  He is mildly tachypneic with O2 saturations in the low 90s.  Nonetheless, he states that his respiratory status is  better than it was recently.  He was evaluated for evidence of intra-abdominal pathology or residual/hospital-acquired pneumonia that could have been giving him nausea.  He was also evaluated for anginal equivalency-nausea.  At this point, his workup is unremarkable.  No indication of pulmonary or intra-abdominal pathology.  Cardiac enzymes x 2 are stable.  He is awaiting his third and will likely be discharged home. Dr. Lige will follow the results.   Of note, the patient states that he has exhausted his current supply of Xanax .  He last took them 1 to 2 weeks ago, and I do not think he is at risk of withdrawal.  He asked me for another prescription.  However, I told him that this was not something I would recommend or would agree to do.  Amount and/or Complexity of Data Reviewed External Data Reviewed: notes.    Details: Hospital discharge summary Labs: ordered. Decision-making details documented in ED Course. Radiology: ordered and independent interpretation performed. Decision-making details documented in ED Course.    Details: No pneumonia on CT scan ECG/medicine tests: ordered and independent interpretation performed. Decision-making details documented in ED Course.  Risk OTC drugs. Prescription drug management. Decision regarding hospitalization.          Provider Communication  Discharge Medication List as of 09/07/2024  8:50 PM     START taking these medications   Details  diazepam (VALIUM) 5 mg tablet Take one tablet (5 mg dose) by mouth every 12 (twelve) hours as needed for Anxiety (for back spasm) for up to 2 days. Max Daily Amount: 10 mg, Starting Mon 09/07/2024, Until Wed 09/09/2024 at 2359, Normal    dicyclomine (BENTYL) 20 mg tablet Take one tablet (20 mg dose) by mouth 2 (two) times daily., Starting Mon 09/07/2024, Normal    famotidine (PEPCID) 10 mg tablet Take one tablet (10 mg dose) by mouth 2 (two) times daily for 5 days., Starting Mon 09/07/2024, Until Sat  09/12/2024, Normal    promethazine (PHENERGAN,PHENADOZ) 25 MG suppository Place one suppository (25 mg dose) rectally every 8 (eight) hours for 1 day., Starting Mon 09/07/2024, Until Tue 09/08/2024, Normal        Discharge Medication List as of 09/07/2024  8:50 PM      Discharge Medication List as of 09/07/2024  8:50 PM      Clinical Impression Final diagnoses:  Atypical chest pain  Nausea and vomiting, unspecified vomiting type  Medication refill  Nonspecific abdominal pain  Abnormal CT of the abdomen  History of abdominal aortic aneurysm (AAA)    ED Disposition     ED Disposition  Discharge   Condition  Stable   Comment  --  Electronically signed by:       [1] Social History Tobacco Use  Smoking Status Former  . Current packs/day: 0.00  . Average packs/day: 1 pack/day for 45.0 years (45.0 ttl pk-yrs)  . Types: Cigars, Cigarettes  . Start date: 09/10/1977  . Quit date: 09/10/2022  . Years since quitting: 1.9  . Passive exposure: Never  Smokeless Tobacco Current  Tobacco Comments   Vaping  [2] No Known Allergies  Therisa KANDICE Silvan, MD 09/08/24 1032

## 2024-09-10 ENCOUNTER — Inpatient Hospital Stay: Admitting: Family Medicine

## 2024-09-11 ENCOUNTER — Ambulatory Visit: Payer: Self-pay

## 2024-09-11 DIAGNOSIS — I4510 Unspecified right bundle-branch block: Secondary | ICD-10-CM | POA: Diagnosis not present

## 2024-09-11 DIAGNOSIS — R531 Weakness: Secondary | ICD-10-CM | POA: Diagnosis not present

## 2024-09-11 DIAGNOSIS — E1169 Type 2 diabetes mellitus with other specified complication: Secondary | ICD-10-CM | POA: Diagnosis not present

## 2024-09-11 DIAGNOSIS — R7989 Other specified abnormal findings of blood chemistry: Secondary | ICD-10-CM | POA: Diagnosis not present

## 2024-09-11 DIAGNOSIS — R9431 Abnormal electrocardiogram [ECG] [EKG]: Secondary | ICD-10-CM | POA: Diagnosis not present

## 2024-09-11 DIAGNOSIS — I10 Essential (primary) hypertension: Secondary | ICD-10-CM | POA: Diagnosis not present

## 2024-09-11 DIAGNOSIS — Z87891 Personal history of nicotine dependence: Secondary | ICD-10-CM | POA: Diagnosis not present

## 2024-09-11 DIAGNOSIS — R072 Precordial pain: Secondary | ICD-10-CM | POA: Diagnosis not present

## 2024-09-11 DIAGNOSIS — E782 Mixed hyperlipidemia: Secondary | ICD-10-CM | POA: Diagnosis not present

## 2024-09-11 DIAGNOSIS — R110 Nausea: Secondary | ICD-10-CM | POA: Diagnosis not present

## 2024-09-11 DIAGNOSIS — I498 Other specified cardiac arrhythmias: Secondary | ICD-10-CM | POA: Diagnosis not present

## 2024-09-11 DIAGNOSIS — R000 Tachycardia, unspecified: Secondary | ICD-10-CM | POA: Diagnosis not present

## 2024-09-11 NOTE — Addendum Note (Signed)
 Addended by: Erion Weightman D on: 09/11/2024 11:47 AM   Modules accepted: Orders

## 2024-09-11 NOTE — Telephone Encounter (Signed)
 FYI Only or Action Required?: Action required by provider: clinical question for provider and update on patient condition.  Patient was last seen in primary care on 08/31/2024 by Alvan Dorothyann BIRCH, MD.  Called Nurse Triage reporting Abdominal Pain.  Symptoms began a couple of weeks ago.  Interventions attempted: Other: has been seen in the ED.  Symptoms are: unchanged.  Triage Disposition: See Physician Within 24 Hours  Patient/caregiver understands and will follow disposition?: No, wishes to speak with PCP     Copied from CRM #[id number]. Topic: Clinical - Red Word Triage >> Sep 11, 2024  9:50 AM Winona R wrote: Stomach pain. Started last night was not after a meal      Reason for Disposition  [1] MODERATE pain (e.g., interferes with normal activities) AND [2] pain comes and goes (cramps) AND [3] present > 24 hours  (Exception: Pain with Vomiting or Diarrhea - see that Guideline.)  Answer Assessment - Initial Assessment Questions Patient has a hospital follow up appointment on 11/3 and is requesting something to help with his abdominal pain until that time. Please advise.     1. LOCATION: Where does it hurt?      Lower abdomen  2. RADIATION: Does the pain shoot anywhere else? (e.g., chest, back)     No radiation  3. ONSET: When did the pain begin? (Minutes, hours or days ago)      A couple of weeks ago  4. SUDDEN: Gradual or sudden onset?     Gradual  5. PATTERN Does the pain come and go, or is it constant?     Intermittent  6. SEVERITY: How bad is the pain?  (e.g., Scale 1-10; mild, moderate, or severe)     7/10 7. RECURRENT SYMPTOM: Have you ever had this type of stomach pain before? If Yes, ask: When was the last time? and What happened that time?      Yes, similar pain 8. CAUSE: What do you think is causing the stomach pain? (e.g., gallstones, recent abdominal surgery)     Unsure  9. RELIEVING/AGGRAVATING FACTORS: What makes it better or  worse? (e.g., antacids, bending or twisting motion, bowel movement)     No 10. OTHER SYMPTOMS: Do you have any other symptoms? (e.g., back pain, diarrhea, fever, urination pain, vomiting)       No  Protocols used: Abdominal Pain - Male-A-AH

## 2024-09-11 NOTE — Telephone Encounter (Signed)
 Really have anything else to offer for pain except for he can use Tylenol  he can use a heating pad, he can also try taking a heartburn pill such as Prilosec or Nexium over-the-counter and see if that helps.

## 2024-09-14 ENCOUNTER — Inpatient Hospital Stay: Admitting: Family Medicine

## 2024-09-15 NOTE — Telephone Encounter (Signed)
 This task has been completed as requested. The patient notified of the update. No other inquiries during the call. Per the patient, he is feeling better. No other inquiries during the call.

## 2024-09-16 DIAGNOSIS — R0609 Other forms of dyspnea: Secondary | ICD-10-CM | POA: Diagnosis not present

## 2024-09-16 DIAGNOSIS — Z72821 Inadequate sleep hygiene: Secondary | ICD-10-CM | POA: Diagnosis not present

## 2024-09-16 DIAGNOSIS — F99 Mental disorder, not otherwise specified: Secondary | ICD-10-CM | POA: Diagnosis not present

## 2024-09-16 DIAGNOSIS — F5105 Insomnia due to other mental disorder: Secondary | ICD-10-CM | POA: Diagnosis not present

## 2024-09-16 DIAGNOSIS — Z87891 Personal history of nicotine dependence: Secondary | ICD-10-CM | POA: Diagnosis not present

## 2024-09-16 DIAGNOSIS — G4733 Obstructive sleep apnea (adult) (pediatric): Secondary | ICD-10-CM | POA: Diagnosis not present

## 2024-09-16 DIAGNOSIS — Z09 Encounter for follow-up examination after completed treatment for conditions other than malignant neoplasm: Secondary | ICD-10-CM | POA: Diagnosis not present

## 2024-09-21 DIAGNOSIS — R110 Nausea: Secondary | ICD-10-CM | POA: Diagnosis not present

## 2024-09-21 DIAGNOSIS — R109 Unspecified abdominal pain: Secondary | ICD-10-CM | POA: Diagnosis not present

## 2024-09-21 DIAGNOSIS — Z87891 Personal history of nicotine dependence: Secondary | ICD-10-CM | POA: Diagnosis not present

## 2024-09-21 DIAGNOSIS — R460 Very low level of personal hygiene: Secondary | ICD-10-CM | POA: Diagnosis not present

## 2024-09-21 DIAGNOSIS — R1084 Generalized abdominal pain: Secondary | ICD-10-CM | POA: Diagnosis not present

## 2024-09-21 DIAGNOSIS — I4510 Unspecified right bundle-branch block: Secondary | ICD-10-CM | POA: Diagnosis not present

## 2024-09-23 ENCOUNTER — Telehealth (HOSPITAL_COMMUNITY): Payer: Self-pay | Admitting: Psychiatry

## 2024-09-23 DIAGNOSIS — F411 Generalized anxiety disorder: Secondary | ICD-10-CM

## 2024-09-23 MED ORDER — ALPRAZOLAM 0.5 MG PO TABS: 0.5000 mg | ORAL_TABLET | Freq: Every day | ORAL | 2 refills | Status: AC | PRN

## 2024-09-23 NOTE — Telephone Encounter (Signed)
 Patient called requesting refill of ALPRAZolam  (XANAX ) 0.5 MG tablet .   [facility] [id number] - [place], [place] - [address] (Ph: [phone])   Last ordered: 06/23/2024-30 tablets with 2 refills Last visit: 05/01/2024 Next visit: 10/30/2024

## 2024-09-26 ENCOUNTER — Other Ambulatory Visit: Payer: Self-pay | Admitting: Family Medicine

## 2024-09-26 DIAGNOSIS — Z794 Long term (current) use of insulin: Secondary | ICD-10-CM

## 2024-09-28 ENCOUNTER — Ambulatory Visit: Admitting: Family Medicine

## 2024-09-28 ENCOUNTER — Encounter: Payer: Self-pay | Admitting: Family Medicine

## 2024-09-28 VITALS — BP 126/68 | HR 110 | Ht 69.0 in | Wt 186.0 lb

## 2024-09-28 DIAGNOSIS — N3945 Continuous leakage: Secondary | ICD-10-CM

## 2024-09-28 DIAGNOSIS — R0602 Shortness of breath: Secondary | ICD-10-CM

## 2024-09-28 DIAGNOSIS — R110 Nausea: Secondary | ICD-10-CM

## 2024-09-28 DIAGNOSIS — I714 Abdominal aortic aneurysm, without rupture, unspecified: Secondary | ICD-10-CM | POA: Diagnosis not present

## 2024-09-28 MED ORDER — ESOMEPRAZOLE MAGNESIUM 40 MG PO CPDR
40.0000 mg | DELAYED_RELEASE_CAPSULE | Freq: Every day | ORAL | 2 refills | Status: AC
Start: 2024-09-28 — End: ?

## 2024-09-28 NOTE — Progress Notes (Signed)
 Established Patient Office Visit  Patient ID: Gabriel Gilbert, male    DOB: [birth year]  Age: 73 y.o. MRN: [id number] PCP: Alvan Dorothyann BIRCH, MD  Chief Complaint  Patient presents with   Hospitalization Follow-up    Subjective:     HPI  Discussed the use of AI scribe software for clinical note transcription with the patient, who gave verbal consent to proceed.  History of Present Illness Gabriel Gilbert is a 73 year old male who presents for a follow-up after a recent emergency department visit for abdominal pain and nausea. He is accompanied by a family member who assists with his care.  Abdominal pain and nausea - Generalized abdominal pain and nausea began the night before the emergency department visit on November 10th - Similar episodes have occurred in the past with a negative workup - Recent emergency department evaluation: blood work showed borderline low sodium at 135 and slightly elevated glucose at 111; CBC was normal - CT scan of abdomen, chest, and pelvis on October 27th was normal except for increase in size of abdominal aortic aneurysm to 4 cm - Currently, no abdominal pain or nausea - Not eating enough, not consuming three meals a day, believes this contributes to nausea  Dyspnea on exertion - Shortness of breath, particularly when walking short distances such as from a parking lot to a store - Difficulty walking short distances without needing to rest - Family member observes use of diaphragm to breathe - Scheduled to see sleep doctor in two weeks to evaluate for COPD, exact appointment date uncertain  Urinary incontinence - Urinary leakage occurs with every episode - No evaluation with urologist to date - Previously stopped taking medication for incontinence due to lack of benefit  Gastroesophageal symptoms and medication use - Currently taking Nexium twice daily for stomach issues - Switched from Ex[employer] to Nexium due to insurance coverage issues -  Nexium is effective in controlling symptoms     ROS    Objective:     BP 126/68   Pulse (!) 110   Ht 5' 9 (1.753 m)   Wt 186 lb (84.4 kg)   SpO2 93%   BMI 27.47 kg/m    Physical Exam Vitals and nursing note reviewed.  Constitutional:      Appearance: Normal appearance.  HENT:     Head: Normocephalic and atraumatic.  Eyes:     Conjunctiva/sclera: Conjunctivae normal.  Cardiovascular:     Rate and Rhythm: Normal rate and regular rhythm.  Pulmonary:     Effort: Pulmonary effort is normal.     Breath sounds: Normal breath sounds.  Skin:    General: Skin is warm and dry.  Neurological:     Mental Status: He is alert.  Psychiatric:        Mood and Affect: Mood normal.      No results found for any visits on 09/28/24.    The ASCVD Risk score (Arnett DK, et al., 2019) failed to calculate for the following reasons:   Risk score cannot be calculated because patient has a medical history suggesting prior/existing ASCVD    Assessment & Plan:   Problem List Items Addressed This Visit       Cardiovascular and Mediastinum   Abdominal aortic aneurysm - Primary     Other   Urinary incontinence   Other Visit Diagnoses       Nausea       Relevant Medications   esomeprazole (NEXIUM) 40 MG capsule  SOB (shortness of breath)           Assessment and Plan Assessment & Plan Hospital Follow-up Follow-up after emergency visit for abdominal pain and nausea. Previous imaging normal. Sodium borderline low, glucose slightly elevated. No current symptoms. - Ensure adequate nutrition with three small meals or snacks daily, avoiding greasy foods. - Consider endoscopy if symptoms persist.  Chronic nausea Possibly related to inadequate nutrition. No evidence of blockage or pancreatitis. Potential stomach irritation or ulcer. Nexium helpful. - try to eat 3 x a day.   - Continue Nexium once daily for a couple of weeks to assess impact. - If effective, continue Nexium  for six weeks, then reassess need for long-term use.  Abdominal aortic aneurysm, 4 cm, without rupture Increase in size to 4 cm. No rupture or acute symptoms. - Recommended follow-up in one year to monitor size.  Shortness of breath, evaluation for COPD planned Shortness of breath with exertion. No formal COPD diagnosis. PFTs planned but not scheduled. - If sleep doctor does not schedule PFTs, will order pulmonary function testing.  Urinary incontinence Possibly related to bladder or prostate issues. Previous urology consultation noted. Medication stopped due to lack of benefit. - No current plan for urology follow-up unless symptoms worsen or he desires further evaluation.    No follow-ups on file.    Dorothyann Byars, MD [facility] at [facility]

## 2024-10-01 DIAGNOSIS — I2510 Atherosclerotic heart disease of native coronary artery without angina pectoris: Secondary | ICD-10-CM | POA: Diagnosis not present

## 2024-10-01 DIAGNOSIS — I252 Old myocardial infarction: Secondary | ICD-10-CM | POA: Diagnosis not present

## 2024-10-30 ENCOUNTER — Telehealth (INDEPENDENT_AMBULATORY_CARE_PROVIDER_SITE_OTHER): Admitting: Psychiatry

## 2024-10-30 ENCOUNTER — Encounter (HOSPITAL_COMMUNITY): Payer: Self-pay | Admitting: Psychiatry

## 2024-10-30 DIAGNOSIS — F411 Generalized anxiety disorder: Secondary | ICD-10-CM | POA: Diagnosis not present

## 2024-10-30 DIAGNOSIS — F331 Major depressive disorder, recurrent, moderate: Secondary | ICD-10-CM

## 2024-10-30 DIAGNOSIS — F1090 Alcohol use, unspecified, uncomplicated: Secondary | ICD-10-CM

## 2024-10-30 DIAGNOSIS — Z79899 Other long term (current) drug therapy: Secondary | ICD-10-CM | POA: Diagnosis not present

## 2024-10-30 MED ORDER — PAROXETINE HCL 10 MG PO TABS: 10.0000 mg | ORAL_TABLET | Freq: Every day | ORAL | 0 refills | Status: AC

## 2024-10-30 NOTE — Progress Notes (Signed)
 " [facility] Outpatient Follow up visit  Telepsych Patient Identification: Gabriel Gilbert MRN:  [id number] Date of Evaluation:  10/30/2024 Referral Source: primary care.  Chief Complaint:    anxiety Visit Diagnosis:    ICD-10-CM   1. GAD (generalized anxiety disorder)  F41.1     2. Major depressive disorder, recurrent episode, moderate (HCC)  F33.1     3. Alcohol use  F10.90     Virtual Visit via Video Note  I connected with Gabriel Gilbert on 10/30/2024 at 10:00 AM EST by a video enabled telemedicine application and verified that I am speaking with the correct person using two identifiers.  Location: Patient: home Provider: home office   I discussed the limitations of evaluation and management by telemedicine and the availability of in person appointments. The patient expressed understanding and agreed to proceed.     I discussed the assessment and treatment plan with the patient. The patient was provided an opportunity to ask questions and all were answered. The patient agreed with the plan and demonstrated an understanding of the instructions.   The patient was advised to call back or seek an in-person evaluation if the symptoms worsen or if the condition fails to improve as anticipated.  I provided 16 minutes of non-face-to-face time during this encounter.    HPI:  Chart reviewed, follows with pcp  On evaluation patient remains baseline says he still goes to [employer] and tries to go out every day wants to get back on Paxil  as he was noncompliant at times feels somewhat subdued and lonely discussed compliance with medication take Xanax  on a daily basis to combat anxiety or any panic symptoms follows with primary care physician understands to continue to work on his comorbid medical conditions   Motivation is baseline  Aggravating factor: Loneliness  ,modifying factor: dog, some friends goes to [place].  Timing in morning   Past Psychiatric History: depression  ,anxiety  Previous Psychotropic Medications: Yes   Substance Abuse History in the last 12 months:  No.  Consequences of Substance Abuse: NA  Past Medical History:  Past Medical History:  Diagnosis Date   Bell's palsy 12-10   Depression    Diabetes mellitus    type 2   Hyperlipidemia    Hypertension     Past Surgical History:  Procedure Laterality Date   CATARACT EXTRACTION Left    kidney stone removed     pfts     normal    Family Psychiatric History: anxiety. Mom used valium  Family History:  Family History  Problem Relation Age of Onset   COPD Mother    Hypertension Mother    Alzheimer's disease Mother        ?   Diabetes Father    COPD Father    Drug abuse Son    COPD Sister    Cancer Sister        lung    Social History:   Social History   Socioeconomic History   Marital status: Divorced    Spouse name: Not on file   Number of children: 1   Years of education: 12   Highest education level: 12th grade  Occupational History   Occupation: Retired  Tobacco Use   Smoking status: Former    Current packs/day: 1.00    Average packs/day: 1 pack/day for 35.0 years (35.0 ttl pk-yrs)    Types: Cigarettes    Start date: 02/11/1988    Quit date: 02/10/2013  Passive exposure: Past   Smokeless tobacco: Never   Tobacco comments:    using electronic cigarettes  Vaping Use   Vaping status: Former  Substance and Sexual Activity   Alcohol use: No    Alcohol/week: 0.0 standard drinks of alcohol   Drug use: No   Sexual activity: Not Currently    Partners: Female  Other Topics Concern   Not on file  Social History Narrative   Lives alone. He has one child. He enjoys watching television.    Social Drivers of Health   Tobacco Use: Medium Risk (10/30/2024)   Patient History    Smoking Tobacco Use: Former    Smokeless Tobacco Use: Never    Passive Exposure: Past  Physicist, Medical Strain: Low Risk (02/27/2024)   Overall Financial Resource Strain (CARDIA)     Difficulty of Paying Living Expenses: Not hard at all  Food Insecurity: No Food Insecurity (09/01/2024)   Received from [facility]   Epic    Within the past 12 months, you worried that your food would run out before you got the money to buy more.: Never true    Within the past 12 months, the food you bought just didn't last and you didn't have money to get more.: Never true  Transportation Needs: No Transportation Needs (09/01/2024)   Received from [facility]    In the past 12 months, has lack of transportation kept you from medical appointments or from getting medications?: No    In the past 12 months, has lack of transportation kept you from meetings, work, or from getting things needed for daily living?: No  Physical Activity: Insufficiently Active (02/27/2024)   Exercise Vital Sign    Days of Exercise per Week: 7 days    Minutes of Exercise per Session: 10 min  Stress: Stress Concern Present (09/01/2024)   Received from [facility] - Occupational Stress Questionnaire    Do you feel stress - tense, restless, nervous, or anxious, or unable to sleep at night because your mind is troubled all the time - these days?: To some extent  Social Connections: Socially Isolated (02/27/2024)   Social Connection and Isolation Panel    Frequency of Communication with Friends and Family: More than three times a week    Frequency of Social Gatherings with Friends and Family: More than three times a week    Attends Religious Services: Never    Database Administrator or Organizations: No    Attends Banker Meetings: Never    Marital Status: Divorced  Depression (PHQ2-9): Medium Risk (09/28/2024)   Depression (PHQ2-9)    PHQ-2 Score: 9  Alcohol Screen: Low Risk (02/27/2024)   Alcohol Screen    Last Alcohol Screening Score (AUDIT): 0  Housing: Low Risk (09/01/2024)   Received from [facility]    In the last 12 months,  was there a time when you were not able to pay the mortgage or rent on time?: No    In the past 12 months, how many times have you moved where you were living?: 0    At any time in the past 12 months, were you homeless or living in a shelter (including now)?: No  Recent Concern: Housing - High Risk (08/14/2024)   Received from [facility]    In the last 12 months, was there a time when you were not able to pay  the mortgage or rent on time?: Yes    In the past 12 months, how many times have you moved where you were living?: 1    Homeless in the Last Year: No  Utilities: Not At Risk (09/01/2024)   Received from [facility]    In the past 12 months has the electric, gas, oil, or water company threatened to shut off services in your home?: No  Health Literacy: Adequate Health Literacy (02/27/2024)   B1300 Health Literacy    Frequency of need for help with medical instructions: Never      Allergies:  No Known Allergies  Metabolic Disorder Labs: Lab Results  Component Value Date   HGBA1C 7.4 (A) 08/11/2024   No results found for: PROLACTIN Lab Results  Component Value Date   CHOL 154 06/17/2023   TRIG 185 (H) 06/17/2023   HDL 41 06/17/2023   CHOLHDL 3.8 06/17/2023   VLDL 22 01/17/2016   LDLCALC 82 06/17/2023   LDLCALC 72 12/27/2021     Current Medications: Current Outpatient Medications  Medication Sig Dispense Refill   PARoxetine  (PAXIL ) 10 MG tablet Take 1 tablet (10 mg total) by mouth daily. 30 tablet 0   albuterol  (VENTOLIN  HFA) 108 (90 Base) MCG/ACT inhaler Inhale 2 puffs into the lungs every 6 (six) hours as needed for wheezing or shortness of breath.     ALPRAZolam  (XANAX ) 0.5 MG tablet Take 1 tablet (0.5 mg total) by mouth daily as needed. 30 tablet 2   aspirin EC 81 MG tablet Take 81 mg by mouth daily. Swallow whole.     DROPLET PEN NEEDLES 31G X 5 MM MISC USE FOR INJECTION DAILY 100 each 9   esomeprazole  (NEXIUM ) 40 MG capsule Take 1 capsule (40  mg total) by mouth daily before breakfast. About 20 min before first meal of day 30 capsule 2   finasteride (PROSCAR) 5 MG tablet Take 5 mg by mouth daily.     glucose blood (ONE TOUCH TEST STRIPS) test strip Pt testing one time a day. 100 each 3   JARDIANCE  25 MG TABS tablet TAKE 1 TABLET BY MOUTH DAILY 90 tablet 3   lisinopril  (ZESTRIL ) 10 MG tablet TAKE 1 TABLET BY MOUTH DAILY 90 tablet 1   metFORMIN  (GLUCOPHAGE ) 1000 MG tablet Take 1 tablet (1,000 mg total) by mouth 2 (two) times daily with a meal. 180 tablet 3   metoprolol  succinate (TOPROL -XL) 25 MG 24 hr tablet TAKE 1 TABLET BY MOUTH DAILY 90 tablet 1   Misc. Devices MISC New CPAP machine with mask and supplies through [facility].  CPAP therapy at 14 cm. water pressure. (Patient taking differently: 14 cm by Other route at bedtime. Per 08/06/24 [facility])     Misc. Devices MISC Take 7-14 cm by mouth at bedtime.     ondansetron  (ZOFRAN -ODT) 4 MG disintegrating tablet Take 1 tablet (4 mg total) by mouth every 8 (eight) hours as needed for nausea or vomiting. 20 tablet 1   ONE TOUCH ULTRA TEST test strip TEST as directed 100 each 3   rosuvastatin  (CRESTOR ) 10 MG tablet Take 1 tablet (10 mg total) by mouth daily. 100 tablet 3   tamsulosin  (FLOMAX ) 0.4 MG CAPS capsule Take 0.4 mg by mouth at bedtime.     tirzepatide  (MOUNJARO ) 2.5 MG/0.5ML Pen Inject 2.5 mg into the skin once a week. (Patient not taking: Reported on 09/28/2024) 2 mL 0   tirzepatide  (MOUNJARO ) 5 MG/0.5ML Pen Inject  5 mg into the skin once a week. (Patient not taking: Reported on 09/28/2024) 2 mL 0   TOUJEO  SOLOSTAR 300 UNIT/ML Solostar Pen INJECT 37 - 40 UNITS INTO THE SKIN EVERY NIGHT AT BEDTIME 9 mL 1   traMADol  (ULTRAM ) 50 MG tablet Take 1-2 tablets (50-100 mg total) by mouth every 8 (eight) hours as needed. 20 tablet 0   No current facility-administered medications for this visit.      Psychiatric Specialty Exam: Review of Systems   Cardiovascular:  Negative for chest pain and palpitations.  Psychiatric/Behavioral:  Negative for suicidal ideas.     There were no vitals taken for this visit.There is no height or weight on file to calculate BMI.  General Appearance: casual  Eye Contact:  fair  Speech:  Slow  Volume:  Decreased  Mood: Somewhat subdued  Affect:   Thought Process:  Goal Directed  Orientation:  Full (Time, Place, and Person)  Thought Content:  Rumination  Suicidal Thoughts:  No  Homicidal Thoughts:  No  Memory:  Immediate;   Fair Recent;   Fair  Judgement:  Poor  Insight:  Shallow  Psychomotor Activity:  Decreased  Concentration:  Concentration: Fair and Attention Span: Fair  Recall:  [employer] of Knowledge:Fair  Language: Fair  Akathisia:  Negative  Handed:  Right  AIMS (if indicated):  0  Assets:  Desire for Improvement  ADL's:  Intact  Cognition: WNL  Sleep:  fair    Treatment Plan Summary: Medication management and Plan as follows  Prior documentation reviewed  1. Major depression moderate recurrent: Somewhat subdued discussed compliance with Paxil  will restart 10 mg discussed that it does take a week or 2 to get effect call earlier if needed but have any questions   2.GAD: Fluctuating Xanax  does help continue 1 a day and not more discussed dependence also take not if not needed Paxil  for anxiety   3. Alcohol use: He denies use discussed abstinence and relapse prevention Reviewed medication questions addressed follow-up in 6 months or earlier if needed.    Jackey Flight, MD 12/19/202510:40 AM "

## 2024-11-10 ENCOUNTER — Encounter: Payer: Self-pay | Admitting: Family Medicine

## 2024-11-10 ENCOUNTER — Ambulatory Visit (INDEPENDENT_AMBULATORY_CARE_PROVIDER_SITE_OTHER): Admitting: Family Medicine

## 2024-11-10 VITALS — BP 114/55 | HR 85 | Ht 69.0 in | Wt 192.0 lb

## 2024-11-10 DIAGNOSIS — E1129 Type 2 diabetes mellitus with other diabetic kidney complication: Secondary | ICD-10-CM | POA: Diagnosis not present

## 2024-11-10 DIAGNOSIS — E11630 Type 2 diabetes mellitus with periodontal disease: Secondary | ICD-10-CM

## 2024-11-10 DIAGNOSIS — Z794 Long term (current) use of insulin: Secondary | ICD-10-CM | POA: Diagnosis not present

## 2024-11-10 DIAGNOSIS — R809 Proteinuria, unspecified: Secondary | ICD-10-CM | POA: Diagnosis not present

## 2024-11-10 LAB — POCT GLYCOSYLATED HEMOGLOBIN (HGB A1C): Hemoglobin A1C: 7.1 % — AB (ref 4.0–5.6)

## 2024-11-10 LAB — POCT UA - MICROALBUMIN
Albumin/Creatinine Ratio, Urine, POC: <30
Creatinine, POC: 50 mg/dL
Microalbumin Ur, POC: 10 mg/L

## 2024-11-10 MED ORDER — TIRZEPATIDE 7.5 MG/0.5ML ~~LOC~~ SOAJ: 7.5000 mg | SUBCUTANEOUS | 1 refills | Status: AC

## 2024-11-10 NOTE — Patient Instructions (Signed)
 Cut you Toujeo /Lantus  down to 30 units starting tomorrow.  When you finish the 5 mg weekly shots of Mounjaro  in a month and start the 7.5 mg dose,  then you can cut your insulin  ( Toujeo /Lantus  ) down to 15 units.

## 2024-11-10 NOTE — Assessment & Plan Note (Signed)
"      Continue lisinopril          "

## 2024-11-10 NOTE — Progress Notes (Signed)
 "  Established Patient Office Visit  Patient ID: Gabriel Gilbert, male    DOB: 04-Nov-1951  Age: 73 y.o. MRN: [id number] PCP: Alvan Dorothyann BIRCH, MD  Chief Complaint  Patient presents with   Diabetes    Subjective:     HPI  Discussed the use of AI scribe software for clinical note transcription with the patient, who gave verbal consent to proceed.  History of Present Illness Gabriel Gilbert is a 73 year old male with diabetes who presents for follow-up on his blood sugar management.  Glycemic control and diabetes management - Currently using Mounjaro  2.5 mg for diabetes management - Recent hemoglobin A1c is 7.1, improved from 7.4 - Initially had difficulty with self-injection of Mounjaro , resulting in a wasted dose; now able to self-administer after pharmacist guidance - No adverse effects from Mounjaro  - Uses Toujeo  insulin  at 40 units daily, with occasional confusion between Toujeo  and Lantus   Appetite and dietary habits - Mounjaro  has decreased appetite, resulting in less frequent hunger - Experiences hunger pains if meals are skipped - Typically eats breakfast and dinner, sometimes skips lunch - Diet includes meals such as hamburgers and steamed Brussels sprouts - Does not consistently include vegetables with meals  Ophthalmologic symptoms - Eye exam performed approximately one year ago - Prescribed reading glasses but does not use them - No interest in reading; prefers television for entertainment  Constitutional symptoms - No recent illnesses or colds     ROS    Objective:     BP (!) 114/55   Pulse 85   Ht 5' 9 (1.753 m)   Wt 192 lb (87.1 kg)   SpO2 94%   BMI 28.35 kg/m    Physical Exam Vitals and nursing note reviewed.  Constitutional:      Appearance: Normal appearance.  HENT:     Head: Normocephalic and atraumatic.  Eyes:     Conjunctiva/sclera: Conjunctivae normal.  Cardiovascular:     Rate and Rhythm: Normal rate and regular rhythm.   Pulmonary:     Effort: Pulmonary effort is normal.     Breath sounds: Normal breath sounds.  Skin:    General: Skin is warm and dry.  Neurological:     Mental Status: He is alert.  Psychiatric:        Mood and Affect: Mood normal.      Results for orders placed or performed in visit on 11/10/24  POCT HgB A1C  Result Value Ref Range   Hemoglobin A1C 7.1 (A) 4.0 - 5.6 %   HbA1c POC (<> result, manual entry)     HbA1c, POC (prediabetic range)     HbA1c, POC (controlled diabetic range)    POCT UA - Microalbumin  Result Value Ref Range   Microalbumin Ur, POC 10 mg/L   Creatinine, POC 50 mg/dL   Albumin/Creatinine Ratio, Urine, POC <30       The ASCVD Risk score (Arnett DK, et al., 2019) failed to calculate for the following reasons:   Risk score cannot be calculated because patient has a medical history suggesting prior/existing ASCVD   * - Cholesterol units were assumed    Assessment & Plan:   Problem List Items Addressed This Visit       Digestive   Type 2 diabetes mellitus with periodontal complication (HCC) - Primary   Relevant Medications   tirzepatide  (MOUNJARO ) 7.5 MG/0.5ML Pen (Start on 12/08/2024)   Other Relevant Orders   POCT HgB A1C (Completed)   Ambulatory referral  to Optometry     Endocrine   Microalbuminuria due to type 2 diabetes mellitus (HCC)   Continue lisinopril        Relevant Medications   tirzepatide  (MOUNJARO ) 7.5 MG/0.5ML Pen (Start on 12/08/2024)   Other Relevant Orders   POCT UA - Microalbumin (Completed)    Assessment and Plan Assessment & Plan Type 2 diabetes mellitus Improved glycemic control. Hemoglobin A1c decreased from 7.4% to 7.1%. Currently on Mounjaro  and Toujeo . Decreased appetite with Mounjaro  expected. Goal: reduce insulin  dependency, achieve A1c in 6% range. - Increase Mounjaro  to 5 mg weekly for four weeks, consider 7.5 mg if tolerated. - If 7.5 mg tolerated, reduce Toujeo  to 20 units daily. - Encouraged smaller,  balanced meals with protein and vegetables. - Advised against skipping meals to prevent hypoglycemia. - Suggested high-protein snacks for lunch to maintain stable blood sugar. - Arrange nurse appointment for Mounjaro  injection technique. - Check if Dr. Jeffie accepts insurance for eye exam.    Return in about 3 months (around 02/08/2025) for Diabetes follow-up.    Dorothyann Byars, MD [facility] at [facility]   "

## 2024-11-26 ENCOUNTER — Ambulatory Visit: Admitting: Podiatry

## 2024-11-26 ENCOUNTER — Encounter: Payer: Self-pay | Admitting: Podiatry

## 2024-11-26 DIAGNOSIS — E1142 Type 2 diabetes mellitus with diabetic polyneuropathy: Secondary | ICD-10-CM

## 2024-11-26 DIAGNOSIS — M79675 Pain in left toe(s): Secondary | ICD-10-CM | POA: Diagnosis not present

## 2024-11-26 DIAGNOSIS — B351 Tinea unguium: Secondary | ICD-10-CM | POA: Diagnosis not present

## 2024-11-26 DIAGNOSIS — M79674 Pain in right toe(s): Secondary | ICD-10-CM | POA: Diagnosis not present

## 2024-11-26 NOTE — Progress Notes (Signed)
"  °  Subjective:  Patient ID: Gabriel Gilbert, male    DOB: May 22, 1951,   MRN: [id number]  Chief Complaint  Patient presents with   Diabetes    Cut my toenails.  Saw Dr. Alvan - 11/10/2024; A1c - 7.1    74 y.o. male presents for concern of thickened elongated and painful nails that are difficult to trim. Requesting to have them trimmed today.  Denies burning and tingling in their feet. Patient is diabetic and last A1c was  Lab Results  Component Value Date   HGBA1C 7.1 (A) 11/10/2024   .   PCP:  Alvan Dorothyann BIRCH, MD    . Denies any other pedal complaints. Denies n/v/f/c.   Past Medical History:  Diagnosis Date   Bell's palsy 12-10   Depression    Diabetes mellitus    type 2   Hyperlipidemia    Hypertension     Objective:  Physical Exam: Vascular: DP/PT pulses 1/4 bilateral. CFT <3 seconds. Absent hair growth on digits and legs. Edema noted to bilateral lower extremities. Xerosis noted bilaterally.  Skin. No lacerations or abrasions bilateral feet. Nails 1-5 bilateral  are thickened discolored and elongated with subungual debris.  Musculoskeletal: MMT 5/5 bilateral lower extremities in DF, PF, Inversion and Eversion. Deceased ROM in DF of ankle joint.  Neurological: Sensation intact to light touch. Protective sensation diminished bilateral.    Assessment:   1. Pain due to onychomycosis of toenails of both feet   2. Type 2 diabetes mellitus with peripheral neuropathy (HCC)      Plan:  Patient was evaluated and treated and all questions answered. -Discussed and educated patient on diabetic foot care, especially with  regards to the vascular, neurological and musculoskeletal systems.  -Stressed the importance of good glycemic control and the detriment of not  controlling glucose levels in relation to the foot. -Discussed supportive shoes at all times and checking feet regularly.  -Mechanically debrided all nails 1-5 bilateral using sterile nail nipper and filed  with dremel without incident  Abis still not preformed. Patient refusing at this time.  -Answered all patient questions -Patient to return  in 3 months for at risk foot care -Patient advised to call the office if any problems or questions arise in the meantime.   Asberry Failing, DPM    "

## 2024-12-09 ENCOUNTER — Telehealth: Payer: Self-pay

## 2024-12-09 DIAGNOSIS — Z794 Long term (current) use of insulin: Secondary | ICD-10-CM

## 2024-12-09 MED ORDER — JARDIANCE 25 MG PO TABS: 25.0000 mg | ORAL_TABLET | Freq: Every day | ORAL | 0 refills | Status: AC

## 2024-12-09 NOTE — Telephone Encounter (Signed)
 Copied from CRM #[id number]. Topic: Clinical - Prescription Issue >> Dec 09, 2024 10:42 AM Mercer PEDLAR wrote: Reason for CRM: Patient is requesting JARDIANCE  25 MG TABS tablet to be sent to [facility] because they do not have it available at [facility].  [facility] [address] [place] - [address] [place]  [place] [address] Phone: [phone] Fax: [phone]

## 2024-12-09 NOTE — Telephone Encounter (Signed)
 FYI  [facility] and [facility] will get the Jardiance  25mg  in but for now due to the weather have been unable to get any prescriptions in.   Patient is requesting that one 90 refill be sent to [facility] main until [employer] [facility] can get the prescription back in stock   One 90 day script has been sent o [id number] for patient .

## 2024-12-15 ENCOUNTER — Telehealth: Payer: Self-pay | Admitting: Family Medicine

## 2024-12-16 ENCOUNTER — Other Ambulatory Visit: Payer: Self-pay | Admitting: Family Medicine

## 2024-12-17 ENCOUNTER — Telehealth: Payer: Self-pay

## 2024-12-17 NOTE — Telephone Encounter (Signed)
 Message left to return call. If patient calls back and a clinical team member can't take his call please read directions verbatim as to what Dr. Alvan advised, so patient can be aware of how he is to be taking his insulin . Thank you    The following is what Dr. Alvan advised him at last OV:   Cut you Toujeo /Lantus  down to 30 units starting tomorrow.   When you finish the 5 mg weekly shots of Mounjaro  in a month and start the 7.5 mg dose,  then you can cut your insulin  ( Toujeo /Lantus  ) down to 15 units.

## 2024-12-17 NOTE — Telephone Encounter (Signed)
 Pt's VM was full.  The following is what Dr. Alvan advised him at last OV:  Cut you Toujeo /Lantus  down to 30 units starting tomorrow.   When you finish the 5 mg weekly shots of Mounjaro  in a month and start the 7.5 mg dose,  then you can cut your insulin  ( Toujeo /Lantus  ) down to 15 units.

## 2024-12-17 NOTE — Telephone Encounter (Signed)
 Copied from CRM [id number]. Topic: Clinical - Medication Question >> Dec 17, 2024 11:53 AM Gabriel Gilbert wrote: Reason for CRM: pt has a question about lantus  medication. He can't remember what he was suppose to take.

## 2024-12-18 NOTE — Telephone Encounter (Signed)
 Called pt and LVM with details of how he would need to take his medication. Advised that he call back if he has any questions/concerns.

## 2025-01-29 ENCOUNTER — Telehealth (HOSPITAL_COMMUNITY): Admitting: Psychiatry

## 2025-02-08 ENCOUNTER — Ambulatory Visit: Admitting: Family Medicine

## 2025-03-02 ENCOUNTER — Ambulatory Visit
